# Patient Record
Sex: Female | Born: 1970
Health system: Southern US, Community
[De-identification: ages and names within clinical notes are randomized; demographics above are authoritative.]

## PROBLEM LIST (undated history)

## (undated) DIAGNOSIS — N946 Dysmenorrhea, unspecified: Secondary | ICD-10-CM

## (undated) DIAGNOSIS — K760 Fatty (change of) liver, not elsewhere classified: Secondary | ICD-10-CM

## (undated) DIAGNOSIS — M1990 Unspecified osteoarthritis, unspecified site: Secondary | ICD-10-CM

## (undated) DIAGNOSIS — S301XXA Contusion of abdominal wall, initial encounter: Secondary | ICD-10-CM

## (undated) DIAGNOSIS — E785 Hyperlipidemia, unspecified: Secondary | ICD-10-CM

## (undated) DIAGNOSIS — J45909 Unspecified asthma, uncomplicated: Secondary | ICD-10-CM

## (undated) DIAGNOSIS — G4730 Sleep apnea, unspecified: Secondary | ICD-10-CM

## (undated) DIAGNOSIS — I10 Essential (primary) hypertension: Secondary | ICD-10-CM

## (undated) DIAGNOSIS — G5603 Carpal tunnel syndrome, bilateral upper limbs: Secondary | ICD-10-CM

## (undated) DIAGNOSIS — R079 Chest pain, unspecified: Secondary | ICD-10-CM

## (undated) DIAGNOSIS — R0600 Dyspnea, unspecified: Secondary | ICD-10-CM

## (undated) DIAGNOSIS — M549 Dorsalgia, unspecified: Secondary | ICD-10-CM

## (undated) DIAGNOSIS — E669 Obesity, unspecified: Secondary | ICD-10-CM

## (undated) DIAGNOSIS — J302 Other seasonal allergic rhinitis: Secondary | ICD-10-CM

## (undated) DIAGNOSIS — K5900 Constipation, unspecified: Secondary | ICD-10-CM

## (undated) DIAGNOSIS — E538 Deficiency of other specified B group vitamins: Secondary | ICD-10-CM

## (undated) DIAGNOSIS — D649 Anemia, unspecified: Secondary | ICD-10-CM

## (undated) DIAGNOSIS — R7303 Prediabetes: Secondary | ICD-10-CM

## (undated) DIAGNOSIS — R002 Palpitations: Secondary | ICD-10-CM

## (undated) DIAGNOSIS — K529 Noninfective gastroenteritis and colitis, unspecified: Secondary | ICD-10-CM

## (undated) DIAGNOSIS — H18609 Keratoconus, unspecified, unspecified eye: Secondary | ICD-10-CM

## (undated) DIAGNOSIS — R600 Localized edema: Secondary | ICD-10-CM

## (undated) DIAGNOSIS — K219 Gastro-esophageal reflux disease without esophagitis: Secondary | ICD-10-CM

## (undated) DIAGNOSIS — B977 Papillomavirus as the cause of diseases classified elsewhere: Secondary | ICD-10-CM

## (undated) HISTORY — DX: Hyperlipidemia, unspecified: E78.5

## (undated) HISTORY — DX: Carpal tunnel syndrome, bilateral upper limbs: G56.03

## (undated) HISTORY — DX: Dysmenorrhea, unspecified: N94.6

## (undated) HISTORY — DX: Prediabetes: R73.03

## (undated) HISTORY — DX: Obesity, unspecified: E66.9

## (undated) HISTORY — DX: Papillomavirus as the cause of diseases classified elsewhere: B97.7

## (undated) HISTORY — DX: Contusion of abdominal wall, initial encounter: S30.1XXA

## (undated) HISTORY — DX: Localized edema: R60.0

## (undated) HISTORY — DX: Constipation, unspecified: K59.00

## (undated) HISTORY — DX: Dorsalgia, unspecified: M54.9

## (undated) HISTORY — PX: MYOMECTOMY ABDOMINAL APPROACH: SUR870

## (undated) HISTORY — DX: Chest pain, unspecified: R07.9

## (undated) HISTORY — DX: Essential (primary) hypertension: I10

## (undated) HISTORY — DX: Unspecified osteoarthritis, unspecified site: M19.90

## (undated) HISTORY — DX: Palpitations: R00.2

## (undated) HISTORY — DX: Unspecified asthma, uncomplicated: J45.909

## (undated) HISTORY — DX: Noninfective gastroenteritis and colitis, unspecified: K52.9

## (undated) HISTORY — DX: Dyspnea, unspecified: R06.00

## (undated) HISTORY — DX: Deficiency of other specified B group vitamins: E53.8

## (undated) HISTORY — DX: Keratoconus, unspecified, unspecified eye: H18.609

---
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
## 2003-12-12 ENCOUNTER — Other Ambulatory Visit: Admission: RE | Admit: 2003-12-12 | Discharge: 2003-12-12 | Payer: Self-pay | Admitting: Obstetrics and Gynecology

## 2004-02-10 ENCOUNTER — Emergency Department (HOSPITAL_COMMUNITY): Admission: EM | Admit: 2004-02-10 | Discharge: 2004-02-10 | Payer: Self-pay | Admitting: Emergency Medicine

## 2005-04-01 ENCOUNTER — Other Ambulatory Visit: Admission: RE | Admit: 2005-04-01 | Discharge: 2005-04-01 | Payer: Self-pay | Admitting: Gynecology

## 2005-06-09 ENCOUNTER — Encounter (INDEPENDENT_AMBULATORY_CARE_PROVIDER_SITE_OTHER): Payer: Self-pay | Admitting: Specialist

## 2005-06-09 ENCOUNTER — Inpatient Hospital Stay (HOSPITAL_COMMUNITY): Admission: RE | Admit: 2005-06-09 | Discharge: 2005-06-11 | Payer: Self-pay | Admitting: Gynecology

## 2006-06-11 ENCOUNTER — Other Ambulatory Visit: Admission: RE | Admit: 2006-06-11 | Discharge: 2006-06-11 | Payer: Self-pay | Admitting: Gynecology

## 2008-04-13 ENCOUNTER — Encounter: Payer: Self-pay | Admitting: Internal Medicine

## 2008-07-03 ENCOUNTER — Encounter: Admission: RE | Admit: 2008-07-03 | Discharge: 2008-07-03 | Payer: Self-pay | Admitting: Internal Medicine

## 2008-07-20 ENCOUNTER — Ambulatory Visit: Payer: Self-pay | Admitting: Gynecology

## 2008-07-20 ENCOUNTER — Encounter: Payer: Self-pay | Admitting: Gynecology

## 2008-07-20 ENCOUNTER — Other Ambulatory Visit: Admission: RE | Admit: 2008-07-20 | Discharge: 2008-07-20 | Payer: Self-pay | Admitting: Gynecology

## 2008-07-24 ENCOUNTER — Ambulatory Visit: Payer: Self-pay | Admitting: Gynecology

## 2008-07-24 ENCOUNTER — Encounter: Admission: RE | Admit: 2008-07-24 | Discharge: 2008-07-24 | Payer: Self-pay | Admitting: Gynecology

## 2008-07-28 ENCOUNTER — Encounter: Admission: RE | Admit: 2008-07-28 | Discharge: 2008-07-28 | Payer: Self-pay | Admitting: Gynecology

## 2008-09-21 ENCOUNTER — Ambulatory Visit: Payer: Self-pay | Admitting: Gynecology

## 2008-12-19 ENCOUNTER — Ambulatory Visit: Payer: Self-pay | Admitting: Gynecology

## 2009-02-28 ENCOUNTER — Encounter: Admission: RE | Admit: 2009-02-28 | Discharge: 2009-02-28 | Payer: Self-pay | Admitting: Gynecology

## 2009-06-17 ENCOUNTER — Inpatient Hospital Stay (HOSPITAL_COMMUNITY)
Admission: EM | Admit: 2009-06-17 | Discharge: 2009-06-19 | Payer: Self-pay | Source: Home / Self Care | Admitting: Emergency Medicine

## 2009-07-30 ENCOUNTER — Ambulatory Visit: Payer: Self-pay | Admitting: Gynecology

## 2009-07-30 ENCOUNTER — Other Ambulatory Visit: Admission: RE | Admit: 2009-07-30 | Discharge: 2009-07-30 | Payer: Self-pay | Admitting: Gynecology

## 2009-07-31 ENCOUNTER — Encounter: Admission: RE | Admit: 2009-07-31 | Discharge: 2009-07-31 | Payer: Self-pay | Admitting: Gynecology

## 2009-08-10 ENCOUNTER — Ambulatory Visit: Payer: Self-pay | Admitting: Gynecology

## 2009-10-05 ENCOUNTER — Ambulatory Visit: Payer: Self-pay | Admitting: Gynecology

## 2009-10-12 ENCOUNTER — Ambulatory Visit: Payer: Self-pay | Admitting: Gynecology

## 2009-12-26 ENCOUNTER — Ambulatory Visit: Payer: Self-pay | Admitting: Gynecology

## 2010-01-17 ENCOUNTER — Ambulatory Visit: Payer: Self-pay | Admitting: Gynecology

## 2010-02-06 ENCOUNTER — Ambulatory Visit: Payer: Self-pay | Admitting: Gynecology

## 2010-03-03 ENCOUNTER — Encounter: Payer: Self-pay | Admitting: Gynecology

## 2010-04-05 ENCOUNTER — Encounter: Payer: Self-pay | Admitting: Internal Medicine

## 2010-04-05 ENCOUNTER — Ambulatory Visit (INDEPENDENT_AMBULATORY_CARE_PROVIDER_SITE_OTHER): Payer: PRIVATE HEALTH INSURANCE | Admitting: Internal Medicine

## 2010-04-05 DIAGNOSIS — I10 Essential (primary) hypertension: Secondary | ICD-10-CM | POA: Insufficient documentation

## 2010-04-05 DIAGNOSIS — R079 Chest pain, unspecified: Secondary | ICD-10-CM | POA: Insufficient documentation

## 2010-04-15 ENCOUNTER — Telehealth: Payer: Self-pay | Admitting: Internal Medicine

## 2010-04-16 ENCOUNTER — Encounter (INDEPENDENT_AMBULATORY_CARE_PROVIDER_SITE_OTHER): Payer: Self-pay | Admitting: *Deleted

## 2010-04-16 ENCOUNTER — Other Ambulatory Visit: Payer: PRIVATE HEALTH INSURANCE

## 2010-04-16 ENCOUNTER — Other Ambulatory Visit: Payer: Self-pay | Admitting: Internal Medicine

## 2010-04-16 DIAGNOSIS — E785 Hyperlipidemia, unspecified: Secondary | ICD-10-CM

## 2010-04-16 DIAGNOSIS — R079 Chest pain, unspecified: Secondary | ICD-10-CM

## 2010-04-16 LAB — LIPID PANEL
Cholesterol: 201 mg/dL — ABNORMAL HIGH (ref 0–200)
HDL: 48 mg/dL (ref >39.00)
Total CHOL/HDL Ratio: 4
Triglycerides: 111 mg/dL (ref 0.0–149.0)
VLDL: 22.2 mg/dL (ref 0.0–40.0)

## 2010-04-16 LAB — LDL CHOLESTEROL, DIRECT: Direct LDL: 141.6 mg/dL

## 2010-04-16 LAB — TSH: TSH: 1.69 u[IU]/mL (ref 0.35–5.50)

## 2010-04-17 ENCOUNTER — Telehealth (INDEPENDENT_AMBULATORY_CARE_PROVIDER_SITE_OTHER): Payer: Self-pay | Admitting: *Deleted

## 2010-04-18 ENCOUNTER — Encounter: Payer: Self-pay | Admitting: Internal Medicine

## 2010-04-18 ENCOUNTER — Ambulatory Visit (HOSPITAL_BASED_OUTPATIENT_CLINIC_OR_DEPARTMENT_OTHER): Payer: PRIVATE HEALTH INSURANCE

## 2010-04-18 ENCOUNTER — Ambulatory Visit (HOSPITAL_COMMUNITY): Payer: PRIVATE HEALTH INSURANCE | Attending: Internal Medicine

## 2010-04-18 ENCOUNTER — Other Ambulatory Visit (HOSPITAL_COMMUNITY): Payer: PRIVATE HEALTH INSURANCE

## 2010-04-18 DIAGNOSIS — R0989 Other specified symptoms and signs involving the circulatory and respiratory systems: Secondary | ICD-10-CM | POA: Insufficient documentation

## 2010-04-18 DIAGNOSIS — R0602 Shortness of breath: Secondary | ICD-10-CM

## 2010-04-18 DIAGNOSIS — I10 Essential (primary) hypertension: Secondary | ICD-10-CM | POA: Insufficient documentation

## 2010-04-18 DIAGNOSIS — R0609 Other forms of dyspnea: Secondary | ICD-10-CM | POA: Insufficient documentation

## 2010-04-18 DIAGNOSIS — R072 Precordial pain: Secondary | ICD-10-CM

## 2010-04-18 DIAGNOSIS — E669 Obesity, unspecified: Secondary | ICD-10-CM | POA: Insufficient documentation

## 2010-04-18 DIAGNOSIS — E785 Hyperlipidemia, unspecified: Secondary | ICD-10-CM | POA: Insufficient documentation

## 2010-04-18 NOTE — Assessment & Plan Note (Signed)
Summary: chest pain/jr   Visit Type:  Initial Consult Primary Provider:  Gaynelle Cage   History of Present Illness: Patient is a 40 year old who was referred from [facility] clinic for evalation of chest pain.     The patinet notes over the past few days having an aching sensation under her L brest.  Lasts at most one hour.  Not pleuritic or associated with activity.  No reflux.  Not positional.  Comes/goes. She does note over the past few years being more SOB with activity.  She has to walk 4 flights of stairs and has to stop to catch her breath.  Feels her heart racing.   Has been seen in GI  in past.    Current Medications (verified): 1)  Benicar Hct 20-12.5 Mg Tabs (Olmesartan Medoxomil-Hctz) .... Once Daily 2)  Flonase 50 Mcg/act Susp (Fluticasone Propionate) .... Uad 3)  Multivitamins   Tabs (Multiple Vitamin) .... Once Daily  Allergies (verified): No Known Drug Allergies  Past History:  Past Medical History: Chest pain. Hypertension  Family History: Mother with hypertension, dyslipidemia Maternal GM died of heart problesm 43  Smoked Maternal GGM had MI at 54.  Smoker  Social History: Denies tobacco. No signif ETOH.  Review of Systems       All systems reviewed.  Neg to the above problem except as noted above.  Vital Signs:  Patient profile:   40 year old female Height:      61 inches Weight:      247 pounds BMI:     46.84 Pulse rate:   83 / minute BP sitting:   126 / 72  (left arm)  Vitals Entered By: Laurance Flatten CMA (April 05, 2010 4:32 PM)  Physical Exam  Additional Exam:  Patient is in NAD HEENT:  Normocephalic, atraumatic. EOMI, PERRLA.  Neck: JVP is normal. No thyromegaly. No bruits.  Lungs: clear to auscultation. No rales no wheezes.  Chest:  tender to palpation under L breast (different than pain she is experiencing) Heart: Regular rate and rhythm. Normal S1, S2. No S3.   No significant murmurs. PMI not displaced.  Abdomen:  Supple, nontender. Normal  bowel sounds. No masses. No hepatomegaly.  Extremities:   Good distal pulses throughout. No lower extremity edema.  Musculoskeletal :moving all extremities.  Neuro:   alert and oriented x3.    EKG  Procedure date:  04/05/2010  Findings:      At [facility] clinic NSR>  91 bpm.  LAD  Impression & Recommendations:  Problem # 1:  CHEST PAIN UNSPECIFIED (ICD-786.50) Assessment New Patient's chest pain is somewhat atypical.  But she has history of HTN, obesity.  Also FHx though later. I would recomm an echo and stress echo to evaluate. Activities as tolerated She has taken Aciphex in the past and I have asked her to try it again (had problems with other agents)  Problem # 2:  HYPERTENSION, BENIGN (ICD-401.1) Keep on same regimen Her updated medication list for this problem includes:    Benicar Hct 20-12.5 Mg Tabs (Olmesartan medoxomil-hctz) ..... Once daily  Problem # 3:  HEALTH SCREENING (ICD-V70.0) Needs to have lipids  Will check at [facility].  Other Orders: Echocardiogram (Echo) Stress Echo (Stress Echo)  Patient Instructions: 1)  Your physician has requested that you have a stress echocardiogram. For further information please visit [email or web address]  Please follow instruction sheet as given. 2)  Your physician has requested that you have an echocardiogram.  Echocardiography is a painless  test that uses sound waves to create images of your heart. It provides your doctor with information about the size and shape of your heart and how well your heart's chambers and valves are working.  This procedure takes approximately one hour. There are no restrictions for this procedure. Prescriptions: ACIPHEX 20 MG TBEC (RABEPRAZOLE SODIUM) 1 every day  #30 x 6   Entered by:   Layne Benton, RN, BSN   Authorized by:   Sherrill Raring, MD, [facility]   Signed by:   Layne Benton, RN, BSN on 04/05/2010   Method used:   Print then Give to Patient   RxID:   [id number]     Appended  Document: chest pain/jr EKG:  NSR  83 bpm.

## 2010-04-23 NOTE — Progress Notes (Signed)
Summary: pt rtn call  Phone Note Call from Patient   Caller: [phone] Reason for Call: Talk to Nurse Summary of Call: rtn call to [facility] Initial call taken by: Glynda Jaeger,  April 15, 2010 10:05 AM  Follow-up for Phone Call        Called patient back. She will have a lipid and a TSH drawn at the [address] office tomorrow.  Layne Benton, RN, BSN  April 15, 2010 7:33 PM

## 2010-04-23 NOTE — Progress Notes (Signed)
Summary: Stress echo appt.  Phone Note Outgoing Call Call back at [facility] Phone [phone]   Call placed by: Stanton Kidney, EMT-P,  April 17, 2010 2:30 PM Call placed to: Patient Action Taken: Phone Call Completed Summary of Call: Spoke with Patient ref: stress echo appt. Stanton Kidney, EMT-P  April 17, 2010 2:31 PM

## 2010-04-30 LAB — BASIC METABOLIC PANEL
BUN: 7 mg/dL (ref 6–23)
BUN: 8 mg/dL (ref 6–23)
CO2: 23 mEq/L (ref 19–32)
CO2: 28 mEq/L (ref 19–32)
Calcium: 8.2 mg/dL — ABNORMAL LOW (ref 8.4–10.5)
Calcium: 8.4 mg/dL (ref 8.4–10.5)
Chloride: 105 mEq/L (ref 96–112)
Chloride: 108 mEq/L (ref 96–112)
Creatinine, Ser: 0.68 mg/dL (ref 0.4–1.2)
Creatinine, Ser: 0.72 mg/dL (ref 0.4–1.2)
GFR calc Af Amer: >60 mL/min (ref >60)
GFR calc Af Amer: >60 mL/min (ref >60)
GFR calc non Af Amer: >60 mL/min (ref >60)
GFR calc non Af Amer: >60 mL/min (ref >60)
Glucose, Bld: 98 mg/dL (ref 70–99)
Glucose, Bld: 99 mg/dL (ref 70–99)
Potassium: 3.7 mEq/L (ref 3.5–5.1)
Potassium: 4.1 mEq/L (ref 3.5–5.1)
Sodium: 137 mEq/L (ref 135–145)
Sodium: 138 mEq/L (ref 135–145)

## 2010-04-30 LAB — CBC
HCT: 36.5 % (ref 36.0–46.0)
HCT: 37.5 % (ref 36.0–46.0)
HCT: 41 % (ref 36.0–46.0)
Hemoglobin: 11.7 g/dL — ABNORMAL LOW (ref 12.0–15.0)
Hemoglobin: 12.2 g/dL (ref 12.0–15.0)
Hemoglobin: 13.4 g/dL (ref 12.0–15.0)
MCHC: 32 g/dL (ref 30.0–36.0)
MCHC: 32.6 g/dL (ref 30.0–36.0)
MCHC: 32.7 g/dL (ref 30.0–36.0)
MCV: 83.9 fL (ref 78.0–100.0)
MCV: 84.2 fL (ref 78.0–100.0)
MCV: 84.6 fL (ref 78.0–100.0)
Platelets: 319 10*3/uL (ref 150–400)
Platelets: 335 10*3/uL (ref 150–400)
Platelets: 412 10*3/uL — ABNORMAL HIGH (ref 150–400)
RBC: 4.31 MIL/uL (ref 3.87–5.11)
RBC: 4.45 MIL/uL (ref 3.87–5.11)
RBC: 4.89 MIL/uL (ref 3.87–5.11)
RDW: 14 % (ref 11.5–15.5)
RDW: 14.3 % (ref 11.5–15.5)
RDW: 14.4 % (ref 11.5–15.5)
WBC: 12.8 10*3/uL — ABNORMAL HIGH (ref 4.0–10.5)
WBC: 13.5 10*3/uL — ABNORMAL HIGH (ref 4.0–10.5)
WBC: 20.2 10*3/uL — ABNORMAL HIGH (ref 4.0–10.5)

## 2010-04-30 LAB — CLOSTRIDIUM DIFFICILE EIA
C difficile Toxins A+B, EIA: NEGATIVE
C difficile Toxins A+B, EIA: NEGATIVE

## 2010-04-30 LAB — DIFFERENTIAL
Basophils Absolute: 0 10*3/uL (ref 0.0–0.1)
Basophils Relative: 0 % (ref 0–1)
Eosinophils Absolute: 0 10*3/uL (ref 0.0–0.7)
Eosinophils Relative: 0 % (ref 0–5)
Lymphocytes Relative: 13 % (ref 12–46)
Lymphs Abs: 2.5 10*3/uL (ref 0.7–4.0)
Monocytes Absolute: 0.9 10*3/uL (ref 0.1–1.0)
Monocytes Relative: 5 % (ref 3–12)
Neutro Abs: 16.7 10*3/uL — ABNORMAL HIGH (ref 1.7–7.7)
Neutrophils Relative %: 83 % — ABNORMAL HIGH (ref 43–77)

## 2010-04-30 LAB — URINALYSIS, ROUTINE W REFLEX MICROSCOPIC
Bilirubin Urine: NEGATIVE
Glucose, UA: NEGATIVE mg/dL
Hgb urine dipstick: NEGATIVE
Ketones, ur: NEGATIVE mg/dL
Nitrite: NEGATIVE
Protein, ur: NEGATIVE mg/dL
Specific Gravity, Urine: 1.002 — ABNORMAL LOW (ref 1.005–1.030)
Urobilinogen, UA: 0.2 mg/dL (ref 0.0–1.0)
pH: 7 (ref 5.0–8.0)

## 2010-04-30 LAB — COMPREHENSIVE METABOLIC PANEL
ALT: 19 U/L (ref 0–35)
AST: 22 U/L (ref 0–37)
Albumin: 3.9 g/dL (ref 3.5–5.2)
Alkaline Phosphatase: 90 U/L (ref 39–117)
BUN: 5 mg/dL — ABNORMAL LOW (ref 6–23)
CO2: 26 mEq/L (ref 19–32)
Calcium: 9.1 mg/dL (ref 8.4–10.5)
Chloride: 100 mEq/L (ref 96–112)
Creatinine, Ser: 0.58 mg/dL (ref 0.4–1.2)
GFR calc Af Amer: >60 mL/min (ref >60)
GFR calc non Af Amer: >60 mL/min (ref >60)
Glucose, Bld: 103 mg/dL — ABNORMAL HIGH (ref 70–99)
Potassium: 3.2 mEq/L — ABNORMAL LOW (ref 3.5–5.1)
Sodium: 135 mEq/L (ref 135–145)
Total Bilirubin: 0.9 mg/dL (ref 0.3–1.2)
Total Protein: 7.5 g/dL (ref 6.0–8.3)

## 2010-04-30 LAB — LIPASE, BLOOD: Lipase: 16 U/L (ref 11–59)

## 2010-06-28 NOTE — Op Note (Signed)
Mindy Collins, Mindy Collins                 ACCOUNT NO.:  [id number]   MEDICAL RECORD NO.:  [id number]          PATIENT TYPE:  INP   LOCATION:  9399                          FACILITY:  [facility]   PHYSICIAN:  Juan H. Lily Peer, M.D.DATE OF BIRTH:  12/15/1970   DATE OF PROCEDURE:  06/09/2005  DATE OF DISCHARGE:                                 OPERATIVE REPORT   INDICATIONS FOR OPERATION:  A 40 year old gravida 0 with symptomatic  leiomyomata uteri consisting of cramping and bloating and heavy periods,  back discomfort and constipation.   PREOPERATIVE DIAGNOSIS:  Symptomatic leiomyomata uteri.   POSTOPERATIVE DIAGNOSIS:  Symptomatic leiomyomata uteri.   ANESTHESIA:  General endotracheal anesthesia.   SURGEON:  Juan H. Lily Peer, M.D.   FIRST ASSISTANT:  Daniel L. Eda Paschal, M.D.   FINDINGS:  A 14 weeks' size and uterus with a large single intramural fundal  myoma measuring approximately 10 cm in diameter.  Normal-appearing tubes and  ovary.   DESCRIPTION OF OPERATION:  After the patient was adequately counseled, she  was taken to the operating room, where she underwent general endotracheal  anesthesia.  The abdomen, vagina and perineum were prepped and draped in the  usual sterile fashion.  A Foley catheter was inserted in an effort to  monitor urinary output and a pediatric catheter was inserted into the  cervical canal into the uterus in an effort to perform an intraoperative  chromopertubation.  After the drapes were in place, a Pfannenstiel skin  incision was made 2 cm above the symphysis pubis.  The incision was carried  down from the skin, subcutaneous tissue, down to the rectus fascia, where a  midline nick was made, the fascia was incised in a transverse fashion.  The  peritoneal cavity was entered cautiously.  The fibroid was identified and  two Lahey clamps were placed in the fundal region of the uterus.  Pitressin  1:1 dilution was infiltrated into the serosa of the fundal region  where the  incision was made on the uterus for a total of 10 mL.  A vertical incision  was made in the fundal region of the uterus and the large 10 cm fibroid was  enucleated.  It weighed 409.3 grams.  The endometrial cavity was not  entered, and the cavity was closed in layered fashion with interrupted  sutures of 0 Vicryl suture, and finally the serosa was closed with an  imbricating stitch of 0 Vicryl suture.  Both tubes and ostia were  identified, chromopertubation was attempted with no known dye spillage,  perhaps from tubal spasm, but the tubes appeared to be lush fimbriated ends  with no abnormality noted, and both ovaries were normal.  The cul-de-sac was  free of adhesions or endometriotic implants.  The pelvic cavity was  copiously irrigated with normal saline solution.  Sponge count and needle  count were correct.  Interceed was placed over the surface of the vertical  fundal incision in an effort to prevent future adhesions.  The visceral  peritoneum was not closed.  The rectus fascia was closed with a running  stitch of  0 Vicryl suture.  The subcutaneous bleeders were Bovie cauterized.  The skin was reapproximated with skin clips, followed by Xeroform gauze and  4 x 8 dressing.  The patient received 10 mL of 0.25% Marcaine in the  incision site for postoperative analgesia.  The patient was  extubated, transferred to recovery room with stable vital signs.  Blood loss  for the procedure was 300 mL.  IV fluid:  2 L of lactated Ringer's.  Urine  output 600 mL.  She had PSA stockings for DVT prophylaxis and received 3 g  was cefoxitin for prophylaxis as well.      Juan H. Lily Peer, M.D.  Electronically Signed     JHF/MEDQ  D:  06/09/2005  T:  06/09/2005  Job:  [id number]

## 2010-06-28 NOTE — H&P (Signed)
NAMEHELMI, HECHAVARRIA                 ACCOUNT NO.:  [id number]   MEDICAL RECORD NO.:  [id number]          PATIENT TYPE:  AMB   LOCATION:  SDC                           [facility]ACILITY:  [facility]   PHYSICIAN:  Juan H. Lily Peer, M.D.DATE OF BIRTH:  1970/05/20   DATE OF ADMISSION:  06/09/2005  DATE OF DISCHARGE:                                HISTORY & PHYSICAL   CHIEF COMPLAINT:  Symptomatic leiomyomatous uteri.   HISTORY:  The patient is a 40 year old gravida 0 who had been seen in the  office on several occasions complaining of bloating, heavy periods, back  discomfort, constipation.  Her workup had consisted of an ultrasound which  demonstrated a large uterus, unable to distinguish a mass or fibroid per se.  The dimensions on the ultrasound of the uterus were 11.9 x 9.2 x 10.5 cm,  right ovary was normal, left ovary was difficult to identify but the adnexa  was negative, no fluid in the cul-de-sac.  The patient had a normal  endometrial biopsy with benign proliferative endometrium, no hyperplasia or  malignancy was reported.  The patient subsequently was sent for an MRI to  help delineate the mass effect on the uterus and the description was as  follows:  The uterus measured 11.4 x 10.4 x 9.8 cm and in the fundal region  of the uterus there was a mass which measured 9.2 x 8.1 x 9.2 cm with  heterogeneous pattern of signal intensity predominantly due to the presence  of a large fundal fibroid with the dimensions given.  No other abnormality  is noted to suspect this to be any malignancy.  The patient is scheduled to  undergo exploratory laparotomy with abdominal myomectomy and she was to  donate 1-2 units of packed red blood cells in the event of autologous blood.   PAST MEDICAL HISTORY:  The patient had cryotherapy many years ago for  dysplasia in 1993, she had history of condylomata ___________ .  She denies  any allergies.  She has seasonal asthma and uses an inhaler on a p.r.n.  basis and had  been using barrier contraception.   PHYSICAL EXAMINATION:  The patient weighs 250 pounds.  Blood pressure  120/80.  HEENT was unremarkable.  Her neck supple, trachea midline, no  carotid bruits, no thyromegaly, lungs were clear to auscultation without  rhonchus or wheezes, heart regular rate and rhythm, no murmurs or gallops.  Breast exam done on February 20 was normal.  Abdomen soft, nontender,  without rebound or guarding.  Pelvic, Bartholin, urethra and Skene's within  normal limits.  Uterus slightly irregular shape, especially towards her left  adnexa, difficult to assess her adnexa.  Rectal exam noncontributory.   ASSESSMENT:  A 40 year old gravida 0 with symptomatic leiomyomatous uteri  consisting of bloating, pressure, heavy periods, constipation.  The patient  had a urine pregnancy test on March 1 prior to her endometrial biopsy which  was negative.  Her endometrial biopsy was benign.  The patient's ultrasound  as reported above.  Pap smear was also normal.  The patient is scheduled to  undergo exploratory  laparotomy with abdominal myomectomy.  The risks and  benefits and pros and cons of the procedure were discussed with the patient  to include infection, also receiving prophylaxis antibiotic, the risk for  deep venous thrombosis and subsequent pulmonary embolism were discussed, she  will have PAS stockings, also in the event of uncontrollable hemorrhage she  may need blood products or blood transfusion, she attempted to donate 1-2  units of her own blood in the event of autologous transfusions.  Nevertheless, if additional blood is needed and she were to receive donor  blood she is fully aware of the potential risks for anaphylactic reactions,  hepatitis and AIDS, and also in the event of uncontrollable hemorrhage she  is fully aware that she could loose all her reproductive organs to even  include her ovaries and need to be on hormone replacement therapy for many  years to  come, also the risk for trauma to internal organs were discussed  with the need for corrective surgery.  All these issues were discussed with  the patient and we will follow accordingly.   PLAN:  The patient is scheduled for exploratory laparotomy with abdominal  myomectomy for Monday, June 09, 2005 at 7:30 a.m. at [facility].      Juan H. Lily Peer, M.D.  Electronically Signed     JHF/MEDQ  D:  06/08/2005  T:  06/08/2005  Job:  [id number]

## 2010-06-28 NOTE — Discharge Summary (Signed)
Mindy Collins, Mindy Collins                 ACCOUNT NO.:  [id number]   MEDICAL RECORD NO.:  [id number]          PATIENT TYPE:  INP   LOCATION:  9319                          [facility]  [facility]   PHYSICIAN:  Juan H. Lily Peer, M.D.DATE OF BIRTH:  11/05/1970   DATE OF ADMISSION:  06/09/2005  DATE OF DISCHARGE:  06/11/2005                                 DISCHARGE SUMMARY   HISTORY:  The patient is a 40 year old gravida 0 with symptomatic  leiomyomatous uteri.  Patient underwent abdominal myomectomy on the morning  of April 30.  She had a 300 mL blood loss, did well.  She received  prophylaxis antibiotics as well as PSA stockings for DVT prophylaxis.  Her  postoperative day she had good urinary output.  Her Foley catheter was  removed.  Temperature max was 98.4.  Her Foley was discontinued.  She  started on clear liquid diet was advancing to a regular diet later that day.  She was instructed to ambulate and shower and her PCA pump was discontinued  and she was placed on Percocet for pain relief.  Her hemoglobin/hematocrit  were 9.3 and 27.8, respectively with a platelet count of 442,000.  Patient  was ready to be discharged home on the second postoperative day and she was  instructed to return to the office in 48 hours to have her staples removed.  Her pathology report demonstrated a benign leiomyoma and excision of an  omental cyst with a benign cyst.  A myoma and an omental cyst that was  excised from her umbilical region which were benign cysts.   FINAL DIAGNOSIS:  Symptomatic leiomyomatous uteri.   PROCEDURE PERFORMED:  Abdominal myomectomy.   FINAL DISPOSITION AND FOLLOW UP:  Patient was instructed to follow up in the  office in 48 hours to have her staples removed.  She was given a  prescription of Lortab 7.5/500 to take one p.o. q.4-6h. p.r.n. pain. For her  anemia she was instructed to take supplemental iron tablets one p.o. daily.  Discharge instructions were provided and will follow  accordingly.      Juan H. Lily Peer, M.D.  Electronically Signed     JHF/MEDQ  D:  07/06/2005  T:  07/07/2005  Job:  [id number]

## 2010-08-02 ENCOUNTER — Encounter: Payer: PRIVATE HEALTH INSURANCE | Admitting: Gynecology

## 2010-08-07 ENCOUNTER — Encounter: Payer: PRIVATE HEALTH INSURANCE | Admitting: Gynecology

## 2010-08-08 ENCOUNTER — Other Ambulatory Visit: Payer: Self-pay | Admitting: Gynecology

## 2010-08-08 ENCOUNTER — Other Ambulatory Visit (HOSPITAL_COMMUNITY)
Admission: RE | Admit: 2010-08-08 | Discharge: 2010-08-08 | Disposition: A | Discharge status: Home / Self Care | Payer: PRIVATE HEALTH INSURANCE | Source: Ambulatory Visit | Attending: Gynecology | Admitting: Gynecology

## 2010-08-08 ENCOUNTER — Encounter (INDEPENDENT_AMBULATORY_CARE_PROVIDER_SITE_OTHER): Payer: PRIVATE HEALTH INSURANCE | Admitting: Gynecology

## 2010-08-08 DIAGNOSIS — B373 Candidiasis of vulva and vagina: Secondary | ICD-10-CM

## 2010-08-08 DIAGNOSIS — Z124 Encounter for screening for malignant neoplasm of cervix: Secondary | ICD-10-CM | POA: Insufficient documentation

## 2010-08-08 DIAGNOSIS — Z01419 Encounter for gynecological examination (general) (routine) without abnormal findings: Secondary | ICD-10-CM

## 2010-08-13 ENCOUNTER — Encounter: Payer: Self-pay | Admitting: Internal Medicine

## 2010-08-13 ENCOUNTER — Ambulatory Visit (INDEPENDENT_AMBULATORY_CARE_PROVIDER_SITE_OTHER): Payer: PRIVATE HEALTH INSURANCE | Admitting: Gynecology

## 2010-08-13 ENCOUNTER — Other Ambulatory Visit: Payer: PRIVATE HEALTH INSURANCE

## 2010-08-13 DIAGNOSIS — D3910 Neoplasm of uncertain behavior of unspecified ovary: Secondary | ICD-10-CM

## 2010-08-13 DIAGNOSIS — R1900 Intra-abdominal and pelvic swelling, mass and lump, unspecified site: Secondary | ICD-10-CM

## 2010-08-13 DIAGNOSIS — D259 Leiomyoma of uterus, unspecified: Secondary | ICD-10-CM

## 2010-08-13 DIAGNOSIS — N83209 Unspecified ovarian cyst, unspecified side: Secondary | ICD-10-CM

## 2010-08-16 ENCOUNTER — Other Ambulatory Visit (INDEPENDENT_AMBULATORY_CARE_PROVIDER_SITE_OTHER): Payer: PRIVATE HEALTH INSURANCE

## 2010-08-16 DIAGNOSIS — Z113 Encounter for screening for infections with a predominantly sexual mode of transmission: Secondary | ICD-10-CM

## 2010-10-29 ENCOUNTER — Ambulatory Visit: Payer: PRIVATE HEALTH INSURANCE

## 2010-10-29 ENCOUNTER — Other Ambulatory Visit: Payer: Self-pay | Admitting: Gynecology

## 2010-10-29 ENCOUNTER — Ambulatory Visit: Payer: PRIVATE HEALTH INSURANCE | Admitting: Gynecology

## 2010-10-29 ENCOUNTER — Ambulatory Visit (INDEPENDENT_AMBULATORY_CARE_PROVIDER_SITE_OTHER): Payer: PRIVATE HEALTH INSURANCE | Admitting: Gynecology

## 2010-10-29 DIAGNOSIS — D3910 Neoplasm of uncertain behavior of unspecified ovary: Secondary | ICD-10-CM

## 2010-10-29 DIAGNOSIS — D259 Leiomyoma of uterus, unspecified: Secondary | ICD-10-CM

## 2010-10-29 DIAGNOSIS — D251 Intramural leiomyoma of uterus: Secondary | ICD-10-CM

## 2010-10-29 DIAGNOSIS — R1900 Intra-abdominal and pelvic swelling, mass and lump, unspecified site: Secondary | ICD-10-CM | POA: Insufficient documentation

## 2010-10-29 DIAGNOSIS — N852 Hypertrophy of uterus: Secondary | ICD-10-CM

## 2010-10-29 NOTE — Progress Notes (Signed)
Patient is a 40 year old gravida 0 with known history of small fibroids in her uterus is 2010. Patient had been in the office on June 28 for her annual exam but due to her BMI being 48 and weighing 251 pounds for complete assessment of the adnexa and ultrasound have been ordered. The ultrasound demonstrated a uterus and measured 12.5 x 5.2 x 4.6 cm with an endometrial stripe of 15 mm the ultrasound had demonstrated intramural myomas the following sizes 42 mm, 34 mm and a prominent endometrial cavity right ovary was normal left ovary echo free follicle measuring 18 mm with a solid mass adjacent to the follicle measuring 20 x 22 x 21 mm negative color flow and posterior wall shadowing from the mass was noted she was supposed to return back from 3 months for an ultrasound and came back a month earlier. The ultrasound today demonstrated a uterus measuring 9.7 x 7.1 x 5.5 cm with an endometrial stripe of 5.4 mm an enlarged intramural myoma measuring 55 x 38 x 44 mm was noted and one measuring 25 x 23 mm with calcifications on the wall noted the right ovary was normal the left ovary had several follicles the left ovary had a cyst and solid mass measuring 34 x 31 x 26 mm negative color flow suspicious for dermoid cysts.  Review of patient's record had indicated that in April 2007 she underwent exploratory laparotomy with removal of a single large intramural myoma that measured 10 cm in size. Patient is not sexually active is having normal menstrual cycles otherwise. We had discussed several scenarios as follows: #1 followup with ultrasound in 3 months if cyst is still persistent to proceed with a laparoscopic ovarian cystectomy. #2 followup with ultrasound in 3 months if cyst still present and myomas still the same size or larger to consider his been an abdominal myomectomy with ovarian cystectomy. #3 followup ultrasound in 3 months as cystoscopy present or larger and fibroid same size or larger and patient  symptomatic and not interested in having children in the future an abdominal hysterectomy with ovarian cystectomy would be the route to go.  A CA 125 will be drawn today patient is fully aware of its limitations. Literature information was provided on the above subject matter. All questions are answered we'll follow accordingly.

## 2010-10-30 LAB — CA 125: CA 125: 14.7 U/mL (ref 0.0–30.2)

## 2010-11-11 ENCOUNTER — Telehealth: Payer: Self-pay | Admitting: *Deleted

## 2010-11-11 NOTE — Telephone Encounter (Signed)
Pt informed with ca 125 results.

## 2010-11-11 NOTE — Telephone Encounter (Signed)
Message copied by Aura Camps on Mon Nov 11, 2010  3:43 PM ------      Message from: Carole Civil R      Created: Mon Nov 11, 2010  3:29 PM      Regarding: CA-125 RESULTS      Contact: [phone]       PT STOPPED BY AT [facility] DESK JUST NOW LOOKING FOR HER CA-125 RESULTS. COULD YOU SEE IF THE LAB IS BACK AND CALL HER WITH THE RESULTS? I BELIEVE THE DOS WAS 10/29/10. THANKS,WENDY

## 2010-12-10 ENCOUNTER — Other Ambulatory Visit: Payer: Self-pay | Admitting: Family Medicine

## 2010-12-13 ENCOUNTER — Other Ambulatory Visit: Payer: Self-pay | Admitting: Family Medicine

## 2010-12-13 ENCOUNTER — Ambulatory Visit
Admission: RE | Admit: 2010-12-13 | Discharge: 2010-12-13 | Disposition: A | Discharge status: Home / Self Care | Payer: PRIVATE HEALTH INSURANCE | Source: Ambulatory Visit | Attending: Family Medicine | Admitting: Family Medicine

## 2010-12-13 DIAGNOSIS — J3489 Other specified disorders of nose and nasal sinuses: Secondary | ICD-10-CM

## 2010-12-13 DIAGNOSIS — R51 Headache: Secondary | ICD-10-CM

## 2010-12-24 ENCOUNTER — Telehealth: Payer: Self-pay

## 2010-12-24 NOTE — Telephone Encounter (Signed)
Patient called me today and said she was to have surgery in Dec. I had not received an order and when I looked in her chart what it appeared was she was to have an u/s follow up in Dec.  She said she and Dr. Glenetta Hew talked about having the u/s the end of Nov and doing surgery the first week in Dec as she is out of school.  She said she was planning on that.  I told her that it usually takes more notice than a week for inpatient surgery assuming she was to need the myomectomy.  Patient has decided she would not want hysterectomy but would like myomectomy.  I told her we could schedule that time for abd myomect, ov cystectomy and hold it and cancel part of it or all of it if not needed after u/s.  She asked about autologus blood donation.  I talked with Dr. Glenetta Hew and he reviewed her chart.  He said if the ovarian cyst is gone she may not even need surgery. He said the fibroid was an incidental finding and not causing any problems.  He may not want to do myomectomy if that fibroid has not changed.  Patient advised there may be no need for surgery if cyst gone. Patient was advised that Dr. Glenetta Hew rec that he really needs to see u/s before any real plans for surgery can be made. We will hold the Dec 12 date and she will need to forgo autologus blood donation if surgery is needed on that date.  She understands and scheduled her u/s for end of November and will go from there.  I did discuss her insurance and her costs of different surgery scenarios that could present.  ka

## 2010-12-30 ENCOUNTER — Other Ambulatory Visit: Payer: Self-pay | Admitting: Gynecology

## 2010-12-30 DIAGNOSIS — N83209 Unspecified ovarian cyst, unspecified side: Secondary | ICD-10-CM

## 2011-01-01 ENCOUNTER — Telehealth: Payer: Self-pay

## 2011-01-01 NOTE — Telephone Encounter (Addendum)
Pt. Is scheduled tentatively for Myomectomy on 01/22/11 at [facility].  To have u/s on Monday to see if Myomectomy even needed.  She saw ENT yesterday and was diagnosed with TMJ.  Part of her treatment plan is to take a high does of Ibuprofen tid for two weeks.  She wondered if  She should wait until after surgery to start this part of her treatment plan.   Patient advised to not start Ibuprofen until she has u/s Monday and talks with Dr. Glenetta Hew then.  I feel sure if surgery needed he will want her to wait until after surgery to start that part of her TMJ treatment plan.

## 2011-01-04 ENCOUNTER — Emergency Department (HOSPITAL_COMMUNITY)
Admission: EM | Admit: 2011-01-04 | Discharge: 2011-01-04 | Disposition: A | Discharge status: Home / Self Care | Payer: PRIVATE HEALTH INSURANCE | Attending: Emergency Medicine | Admitting: Emergency Medicine

## 2011-01-04 ENCOUNTER — Other Ambulatory Visit: Payer: Self-pay

## 2011-01-04 ENCOUNTER — Emergency Department (HOSPITAL_COMMUNITY): Payer: PRIVATE HEALTH INSURANCE

## 2011-01-04 ENCOUNTER — Encounter (HOSPITAL_COMMUNITY): Payer: Self-pay | Admitting: *Deleted

## 2011-01-04 DIAGNOSIS — M25519 Pain in unspecified shoulder: Secondary | ICD-10-CM | POA: Insufficient documentation

## 2011-01-04 DIAGNOSIS — R079 Chest pain, unspecified: Secondary | ICD-10-CM | POA: Insufficient documentation

## 2011-01-04 HISTORY — DX: Gastro-esophageal reflux disease without esophagitis: K21.9

## 2011-01-04 LAB — BASIC METABOLIC PANEL
BUN: 8 mg/dL (ref 6–23)
CO2: 29 mEq/L (ref 19–32)
Calcium: 9.5 mg/dL (ref 8.4–10.5)
Chloride: 99 mEq/L (ref 96–112)
Creatinine, Ser: 0.67 mg/dL (ref 0.50–1.10)
GFR calc Af Amer: >90 mL/min (ref >90)
GFR calc non Af Amer: >90 mL/min (ref >90)
Glucose, Bld: 93 mg/dL (ref 70–99)
Potassium: 3.2 mEq/L — ABNORMAL LOW (ref 3.5–5.1)
Sodium: 136 mEq/L (ref 135–145)

## 2011-01-04 LAB — CBC
HCT: 39.3 % (ref 36.0–46.0)
Hemoglobin: 12.9 g/dL (ref 12.0–15.0)
MCH: 26.4 pg (ref 26.0–34.0)
MCHC: 32.8 g/dL (ref 30.0–36.0)
MCV: 80.5 fL (ref 78.0–100.0)
Platelets: 388 10*3/uL (ref 150–400)
RBC: 4.88 MIL/uL (ref 3.87–5.11)
RDW: 14.1 % (ref 11.5–15.5)
WBC: 12.2 10*3/uL — ABNORMAL HIGH (ref 4.0–10.5)

## 2011-01-04 LAB — TROPONIN I: Troponin I: <0.3 ng/mL (ref <0.30)

## 2011-01-04 MED ORDER — ASPIRIN 81 MG PO CHEW
324.0000 mg | CHEWABLE_TABLET | Freq: Once | ORAL | Status: AC
  Administered 2011-01-04: 324 mg via ORAL
  Filled 2011-01-04: qty 4

## 2011-01-04 MED ORDER — POTASSIUM CHLORIDE CRYS ER 20 MEQ PO TBCR
40.0000 meq | EXTENDED_RELEASE_TABLET | Freq: Once | ORAL | Status: AC
  Administered 2011-01-04: 40 meq via ORAL
  Filled 2011-01-04: qty 2

## 2011-01-04 NOTE — ED Provider Notes (Signed)
Pt has a stress test in May which she states was normal. On Thursday while she was cooking she developed a significant pain in her left shoulder blade that radiated around her ribs to her front chest. She described it as a heavy pressure. She states that she laid down and the pain got quite a bit better. Since Thursday the pain has not completely gotten away, sometimes it doubles her over.   NAD, A&O x 3, when I apply heavy pressure to the chest and back it does not increase the pain. She is still feeling pain in her shoulder blade but has FROM of shoulder, with no increased pain to palpation or movement.  Wll start work-up and move to back for cardiac work-up.   Date: 01/04/2011  Rate: 97  Rhythm: normal sinus rhythm  QRS Axis: normal  Intervals: normal  ST/T Wave abnormalities: normal  Conduction Disutrbances:none  Narrative Interpretation:  Left axis deviation, borderline T abnormalities, borderline prolonged QT interval  Old EKG Reviewed: none available    Dorthula Matas, PA 01/04/11 1706  Dorthula Matas, PA 01/04/11 1723  Dorthula Matas, PA 01/04/11 1907

## 2011-01-04 NOTE — ED Notes (Signed)
B/P 109/70  HR  78  SR, R 18  Pulse ox 100% on room air--Continues to deny chest wall but, does reports mid sternal chest tenderness to touch

## 2011-01-04 NOTE — ED Provider Notes (Signed)
Medical screening examination/treatment/procedure(s) were conducted as a shared visit with non-physician practitioner(s) and myself.  I personally evaluated the patient during the encounter   Joya Gaskins, MD 01/04/11 2322

## 2011-01-04 NOTE — ED Notes (Signed)
Pt states Thursday she was lifting a piece of foil and had pain in her post shoulder, then ribs and arm, pain is intermittent, and is bothering her again today

## 2011-01-04 NOTE — ED Provider Notes (Signed)
PT SEEN WITH PA REPORTS PAIN MOSTLY WORSE WITH MOVEMENT IN BED DENIES SOB/DIAPHORESIS/VOMITING SHE IS WELL APPEARING STABLE FOR D/C  Joya Gaskins, MD 01/04/11 1851

## 2011-01-04 NOTE — ED Notes (Signed)
Placed pt on bedside monitoring

## 2011-01-06 ENCOUNTER — Other Ambulatory Visit: Payer: Self-pay | Admitting: Gynecology

## 2011-01-06 ENCOUNTER — Ambulatory Visit (INDEPENDENT_AMBULATORY_CARE_PROVIDER_SITE_OTHER): Payer: PRIVATE HEALTH INSURANCE | Admitting: Gynecology

## 2011-01-06 ENCOUNTER — Encounter: Payer: Self-pay | Admitting: Gynecology

## 2011-01-06 ENCOUNTER — Ambulatory Visit (INDEPENDENT_AMBULATORY_CARE_PROVIDER_SITE_OTHER): Payer: PRIVATE HEALTH INSURANCE

## 2011-01-06 ENCOUNTER — Other Ambulatory Visit: Payer: PRIVATE HEALTH INSURANCE

## 2011-01-06 ENCOUNTER — Institutional Professional Consult (permissible substitution): Payer: PRIVATE HEALTH INSURANCE | Admitting: Gynecology

## 2011-01-06 VITALS — BP 130/80

## 2011-01-06 DIAGNOSIS — N831 Corpus luteum cyst of ovary, unspecified side: Secondary | ICD-10-CM

## 2011-01-06 DIAGNOSIS — N83209 Unspecified ovarian cyst, unspecified side: Secondary | ICD-10-CM

## 2011-01-06 DIAGNOSIS — N854 Malposition of uterus: Secondary | ICD-10-CM

## 2011-01-06 DIAGNOSIS — N852 Hypertrophy of uterus: Secondary | ICD-10-CM

## 2011-01-06 DIAGNOSIS — D3910 Neoplasm of uncertain behavior of unspecified ovary: Secondary | ICD-10-CM

## 2011-01-06 DIAGNOSIS — D259 Leiomyoma of uterus, unspecified: Secondary | ICD-10-CM

## 2011-01-06 DIAGNOSIS — N83202 Unspecified ovarian cyst, left side: Secondary | ICD-10-CM

## 2011-01-06 DIAGNOSIS — D251 Intramural leiomyoma of uterus: Secondary | ICD-10-CM

## 2011-01-06 NOTE — Progress Notes (Signed)
ELLIEANNA FUNDERBURG 1970-07-09 [id number]   History:    40 y.o.  gravida 0 for preoperative consultation as a result of patient's persistent left ovarian cyst and right intramural myoma. Review of patient's records indicated that back in 2007 as a result of her symptomatic leiomyomatous uteri she had undergone an abdominal myomectomy for a fundal fibroid that measured 10 cm. She had otherwise normal. Tubes and ovaries at that time. Patient been in the office in June of this year for her annual exam but due to her obesity and ultrasound was done in 2 intramural myomas had been noted along with a left ovarian thinwall echo-free cyst measuring 20 x 22 x 22 mm she was instructed to return back in 3 months which she didn't September 18 the ultrasound demonstrated the intramural myoma had increased in size to 55 x 38 x 44 mm and adjacent myoma measuring 25 x 23 mm with calcification. Right ovary was normal a left solid mass well-defined negative color flow measuring 34 x 31 x 26 mm was noted. She didn't return on November 26 for an ultrasound and for discussion of upcoming surgery. Ultrasound demonstrated a uterus with a measurement of 9.9 x 8.2 x 6.9 cm with an intramural myoma measuring 65 x 64 x 56 mm, and an adjacent one measuring 16 x 23 mm. A left solid mass in inferior medial wall of the left ovary measuring 36 x 27 x 38 mm negative color flow. Patient had a normal CA 125 in September of this year.   Past medical history,surgical history, family history and social history were all reviewed and documented in the EPIC chart.  Gynecologic History Patient's last menstrual period was 12/20/2010. Contraception: abstinence Last Pap: 2012. Results were: normal Last mammogram: 2011. Results were: normal  Obstetric History OB History    Grav Para Term Preterm Abortions TAB SAB Ect Mult Living                   ROS:  Was performed and pertinent positives and negatives are included in the  history.  Exam: chaperone present  BP 130/80  LMP 12/20/2010  There is no height or weight on file to calculate BMI.  General appearance : Well developed well nourished female. No acute distress HEENT: Neck supple, trachea midline, no carotid bruits, no thyroidmegaly Lungs: Clear to auscultation, no rhonchi or wheezes, or rib retractions  Heart: Regular rate and rhythm, no murmurs or gallops Breast:Examined in sitting and supine position were symmetrical in appearance, no palpable masses or tenderness,  no skin retraction, no nipple inversion, no nipple discharge, no skin discoloration, no axillary or supraclavicular lymphadenopathy Abdomen: no palpable masses or tenderness, no rebound or guarding Extremities: no edema or skin discoloration or tenderness  Pelvic:  Bartholin, Urethra, Skene Glands: Within normal limits             Vagina: No gross lesions or discharge  Cervix: No gross lesions or discharge  Uterus  upper limits of normal, he regular shaped adnexa limited due to patient's abdominal girth see ultrasound report above.  Anus and perineum  normal   Rectovaginal  normal sphincter tone without palpated masses or tenderness             Hemoccult not done     Assessment/Plan:  40 y.o. female gravida 0 with history of recurrent leiomyomatous. Persistent left ovarian cyst normal C1 25. Suspect endometrioma. Patient with fundal fibroid measuring 6.5 x 6.4 x 5.6 cm. We went for  a detailed discussion of surgical options: 1. Abdominal myomectomy with left ovarian cystectomy or 2. Laparoscopic left ovarian cystectomy or 3. Total abdominal hysterectomy with left ovarian cystectomy  Patient previously been given information on the above subject. She is going to decide and calls back this week to determine which route she would like to proceed. We discussed that at time of her cystectomy she could potentially loose that ovary but all efforts will be made to conserve that ovary. We will  for a detailed discussion of the operation to include the following: The risk of infection, trauma and injury to internal organs, deep venous thrombosis and subsequent pulmonary embolism and death were discussed. She'll receive prophylactic antibiotic as well as PAS stockings. In the event that she would need blood or blood products potential risk of anaphylactic reactions hepatitis and AIDS were discussed. She will try to donate one unit of autologous blood this week. All questions are answered and we'll follow accordingly.    Ok Edwards MD, 1:30 PM 01/06/2011

## 2011-01-07 ENCOUNTER — Telehealth: Payer: Self-pay

## 2011-01-07 NOTE — Telephone Encounter (Signed)
Patient called to say she has decided to do Myomectomy and Ovarian Cystectomy as scheduled.  I have made arrangements for her to schedule her autologus blood donation.    Will you need to see her again?

## 2011-01-07 NOTE — Telephone Encounter (Signed)
Dr. Glenetta Hew told me that he will not need to see this patient again. The previous visit can serve as preop consult.  Order faxed to [facility] for autologus donation. ka

## 2011-01-09 ENCOUNTER — Encounter (HOSPITAL_COMMUNITY): Payer: Self-pay

## 2011-01-09 ENCOUNTER — Telehealth: Payer: Self-pay

## 2011-01-09 NOTE — Telephone Encounter (Signed)
Per Dr. Glenetta Hew it is up to patient. We can reschedule or she can go ahead with surgery. Not likely she will need autologus blood transfusion and if she did and it wasn't available Dr. Glenetta Hew very comfortable with Blood Bank Blood.  Pt. To consider and talk with her mom tonight. The optional resched date is Jan 7 at 1:00 and she will let me know in the am about that.

## 2011-01-09 NOTE — Telephone Encounter (Signed)
The [employer] only does Autologus Donation scheduling on Tuesday. That will only be nine days from her surgery and they cannot guarantee her donation will make it there for her surgery since they request 10d-2wks to process it.  Patient wants to discuss. (Her paper chart is in your box in the hall)

## 2011-01-10 ENCOUNTER — Telehealth: Payer: Self-pay

## 2011-01-10 NOTE — Telephone Encounter (Signed)
Since patient's case has now been rescheduled for January 7 she may donate 2 units autologous blood tab on standby at time of her myomectomy.

## 2011-01-10 NOTE — Telephone Encounter (Signed)
Patient decided to postpone her surgery until Jan 7th so that she can be sure that her autologus donation of blood is available. I rescheduled her to Jan 7 1pm at [facility]  Patient wants to know since now there is plenty of time if you would like her to donate two units instead of just one?

## 2011-01-10 NOTE — Telephone Encounter (Signed)
I left patient a message that  okay for 2 units. I refaxed the revised form to [employer] with a note that date changed and 2 units okay.  ka

## 2011-01-17 ENCOUNTER — Other Ambulatory Visit (HOSPITAL_COMMUNITY): Payer: PRIVATE HEALTH INSURANCE

## 2011-01-24 ENCOUNTER — Ambulatory Visit (INDEPENDENT_AMBULATORY_CARE_PROVIDER_SITE_OTHER): Payer: PRIVATE HEALTH INSURANCE | Admitting: Gynecology

## 2011-01-24 ENCOUNTER — Encounter: Payer: Self-pay | Admitting: Gynecology

## 2011-01-24 VITALS — BP 130/88

## 2011-01-24 DIAGNOSIS — N920 Excessive and frequent menstruation with regular cycle: Secondary | ICD-10-CM

## 2011-01-24 DIAGNOSIS — Z01818 Encounter for other preprocedural examination: Secondary | ICD-10-CM

## 2011-01-24 DIAGNOSIS — Z23 Encounter for immunization: Secondary | ICD-10-CM

## 2011-01-24 NOTE — Progress Notes (Signed)
Mindy Collins is an 40 y.o. female gravida 0 para 0 who presented to the office today for preoperative consultation physical examination. She had been seen the office on November 26 as a result of her heavy periods and left lower quadrant pain. We did discuss the ultrasound finding which demonstrated that her intramural myoma had increased to 65 x 64 x 56 mm and a smaller one measuring 16 x 23 mm, she had a normal right ovary but the left ovary there was an irregular wall measuring 30 x 26 mm with a cyst been echo-free avascular. There was a solid mass inferior medial to this wall measuring 36 x 27 x 38 mm negative color flow Doppler. In September of this year she had a normal CA 125 since we had been following the cyst which has not changed in size. Patient back in 2007 had a large fundal myoma and underwent a abdominal myomectomy. Chromopertubation attempted at that time did not demonstrate tubal patency although normal fallopian tubes bilateral. Patient interested in maintaining her fertility. She has donated one unit of autologous blood is in the process of donating her second unit next week and her surgery is scheduled for Monday, January 7.  Pertinent Gynecological History: Menses: flow is moderate and 7-10 days Bleeding: Heavy periods with clots Contraception: condoms DES exposure: denies Blood transfusions: none Sexually transmitted diseases: no past history Previous GYN Procedures: Abdominal myomectomy with chromopertubation  Last mammogram: Not indicated Date: Not indicated Last pap: normal Date: 2012 OB History: G 0, P 0   Menstrual History: Menarche age: 19 Patient's last menstrual period was 12/14/2010.    Past Medical History  Diagnosis Date  . Chest pain   . Depression   . HPV (human papilloma virus) infection     WARTS/ TCA [place]  . Dysmenorrhea   . Asthma   . Hypertension   . Obesity   . GERD (gastroesophageal reflux disease)     Past Surgical History  Procedure Date    . Gynecologic cryosurgery 1993    DYSPLASIA CERVIX/   . Myomectomy abdominal approach 04/31/2007    Family History  Problem Relation Age of Onset  . Hypertension Mother   . Heart disease Maternal Grandmother   . Cancer Father     LUNG  . Breast cancer Maternal Aunt   . Diabetes Maternal Grandfather     Social History:  reports that she has never smoked. She does not have any smokeless tobacco history on file. She reports that she does not drink alcohol. Her drug history not on file.  Allergies:  Allergies  Allergen Reactions  . Nexium Shortness Of Breath    Nervousness; tolerates Aciphex.  . Latex Itching     (Not in a hospital admission)  Review of systems: Positive for leiomyomatous uteri left lower quadrant pain and ovarian cyst and obesity. Also gastroesophageal reflux and hypertension.  Blood pressure 130/88, last menstrual period 12/14/2010.  Physical exam: HEENT: Unremarkable Lungs: Clear to auscultation Rogers or wheezes Heart: Regular rate and rhythm no murmurs or gallops Breasts: Not examined Abdomen: Pendulous Pfannenstiel scar present nontender Pelvic: Bartholin urethra Skene was within normal limits Vagina: No gross lesions on inspection Cervix: No gross lesions on inspection Uterus 10 week size irregular adnexa difficult to delineate due to patient's obesity Rectal: Not done  No results found for this or any previous visit (from the past 24 hour(s)).  No results found.  Assessment/Plan: 40 year old female with symptomatic leiomyomatous uteri. Back in 2007 she  had an abdominal myomectomy and chromopertubation. Tubal patency was not established before or after the myomectomy and the uterine cavity had not been entered. Gross appearance fallopian tubes appeared to be normal at that time. Patient now with recurrence of her fibroids measuring 65 x 64 x 56 mm and the second 16 x 23 mm as well as a persistent left ovarian cyst measuring 36 x 27 x 38 mm with  normal CA 125. Patient scheduled to undergo abdominal myomectomy left ovarian cystectomy possible left salpingo-oophorectomy along with chromopertubation. Patient has donated one unit of autologous blood and is in the process of donating her second unit. The risks benefits and pros and cons of the operation were discussed in detail to include the following: The risk of deep venous thrombosis and subsequent pulmonary embolism were discussed and for this reason she will have PSA stockings. Also the risk of infection although she will receive prophylaxis antibiotic. Also there is a risk of injury to internal organs requiring corrective surgery at that time. And in the event of uncontrollable hemorrhage and she would need additional blood more than 2 units and she has donated there is risk of anaphylactic reaction hepatitis and AIDS as a result of donor blood. As a life-saving measure she would lose all her reproductive organs she would never be of actual in the future as well. We'll also do chromopertubation again to see if tubal patency is present. If not 6 months down the line we'll proceed with an outpatient HSG. If tubes are still blocked have discussed with her today that in the near future which she plans on getting pregnant and she would be a candidate then for in vitro fertilization. All these issues were discussed in detail with the patient all questions rancher will follow accordingly.  Ok Edwards 01/24/2011, 4:38 PM

## 2011-01-24 NOTE — Patient Instructions (Signed)
Remember not to take any Asprin or nonsteroidal medication 1 week prior to surgery. Take an iron tablet daily.

## 2011-02-12 ENCOUNTER — Encounter (HOSPITAL_COMMUNITY)
Admission: RE | Admit: 2011-02-12 | Discharge: 2011-02-12 | Disposition: A | Discharge status: Home / Self Care | Payer: PRIVATE HEALTH INSURANCE | Source: Ambulatory Visit | Attending: Gynecology | Admitting: Gynecology

## 2011-02-12 ENCOUNTER — Encounter (HOSPITAL_COMMUNITY): Payer: Self-pay

## 2011-02-12 HISTORY — DX: Other seasonal allergic rhinitis: J30.2

## 2011-02-12 HISTORY — DX: Fatty (change of) liver, not elsewhere classified: K76.0

## 2011-02-12 HISTORY — DX: Anemia, unspecified: D64.9

## 2011-02-12 LAB — CBC
HCT: 34.9 % — ABNORMAL LOW (ref 36.0–46.0)
Hemoglobin: 11.4 g/dL — ABNORMAL LOW (ref 12.0–15.0)
MCH: 27.5 pg (ref 26.0–34.0)
MCHC: 32.7 g/dL (ref 30.0–36.0)
MCV: 84.1 fL (ref 78.0–100.0)
Platelets: 375 10*3/uL (ref 150–400)
RBC: 4.15 MIL/uL (ref 3.87–5.11)
RDW: 15.4 % (ref 11.5–15.5)
WBC: 14.2 10*3/uL — ABNORMAL HIGH (ref 4.0–10.5)

## 2011-02-12 LAB — BASIC METABOLIC PANEL
BUN: 10 mg/dL (ref 6–23)
CO2: 28 mEq/L (ref 19–32)
Calcium: 9.3 mg/dL (ref 8.4–10.5)
Chloride: 102 mEq/L (ref 96–112)
Creatinine, Ser: 0.68 mg/dL (ref 0.50–1.10)
GFR calc Af Amer: >90 mL/min (ref >90)
GFR calc non Af Amer: >90 mL/min (ref >90)
Glucose, Bld: 97 mg/dL (ref 70–99)
Potassium: 3.8 mEq/L (ref 3.5–5.1)
Sodium: 137 mEq/L (ref 135–145)

## 2011-02-12 LAB — SURGICAL PCR SCREEN
MRSA, PCR: NEGATIVE
Staphylococcus aureus: POSITIVE — AB

## 2011-02-12 NOTE — Pre-Procedure Instructions (Signed)
Pt had episode of chest pain 01/04/2011-seen as WLER-cardiac ruled out per pt.

## 2011-02-12 NOTE — Patient Instructions (Addendum)
   Your procedure is scheduled on: Monday January 7th  Enter through the [employer] [facility] at: 11:30am Pick up the phone at the desk and dial [phone] and inform us of your arrival.  Please call this number if you have any problems the morning of surgery: [phone]  Remember: Do not eat food after midnight: Sunday Do not drink clear liquids after :Monday at 9am Take these medicines the morning of surgery with a SIP OF WATER: benicar and aciphex  Do not wear jewelry, make-up, or FINGER nail polish Do not wear lotions, powders, perfumes or deodorant. Do not shave 48 hours prior to surgery. Do not bring valuables to the hospital.  Leave suitcase in the car. After Surgery it may be brought to your room. For patients being admitted to the hospital, checkout time is 11:00am the day of discharge.  Patients discharged on the day of surgery will not be allowed to drive home.     Remember to use your hibiclens as instructed.Please shower with 1/2 bottle the evening before your surgery and the other 1/2 bottle the morning of surgery.

## 2011-02-14 ENCOUNTER — Encounter: Payer: Self-pay | Admitting: Oncology

## 2011-02-14 ENCOUNTER — Ambulatory Visit (HOSPITAL_BASED_OUTPATIENT_CLINIC_OR_DEPARTMENT_OTHER): Payer: PRIVATE HEALTH INSURANCE | Admitting: Oncology

## 2011-02-14 ENCOUNTER — Telehealth: Payer: Self-pay | Admitting: Oncology

## 2011-02-14 ENCOUNTER — Other Ambulatory Visit: Payer: PRIVATE HEALTH INSURANCE | Admitting: Lab

## 2011-02-14 ENCOUNTER — Ambulatory Visit: Payer: PRIVATE HEALTH INSURANCE

## 2011-02-14 ENCOUNTER — Other Ambulatory Visit: Payer: Self-pay | Admitting: Oncology

## 2011-02-14 VITALS — BP 129/77 | HR 88 | Temp 97.4°F | Ht 61.5 in | Wt 254.7 lb

## 2011-02-14 DIAGNOSIS — J329 Chronic sinusitis, unspecified: Secondary | ICD-10-CM

## 2011-02-14 DIAGNOSIS — D649 Anemia, unspecified: Secondary | ICD-10-CM

## 2011-02-14 DIAGNOSIS — N920 Excessive and frequent menstruation with regular cycle: Secondary | ICD-10-CM

## 2011-02-14 DIAGNOSIS — D259 Leiomyoma of uterus, unspecified: Secondary | ICD-10-CM

## 2011-02-14 DIAGNOSIS — D72829 Elevated white blood cell count, unspecified: Secondary | ICD-10-CM

## 2011-02-14 LAB — CBC WITH DIFFERENTIAL/PLATELET
BASO%: 1.4 % (ref 0.0–2.0)
Basophils Absolute: 0.1 10*3/uL (ref 0.0–0.1)
EOS%: 0.2 % (ref 0.0–7.0)
Eosinophils Absolute: 0 10*3/uL (ref 0.0–0.5)
HCT: 35.4 % (ref 34.8–46.6)
HGB: 11.5 g/dL — ABNORMAL LOW (ref 11.6–15.9)
LYMPH%: 15.9 % (ref 14.0–49.7)
MCH: 27.1 pg (ref 25.1–34.0)
MCHC: 32.5 g/dL (ref 31.5–36.0)
MCV: 83.2 fL (ref 79.5–101.0)
MONO#: 0.8 10*3/uL (ref 0.1–0.9)
MONO%: 7.3 % (ref 0.0–14.0)
NEUT#: 8.1 10*3/uL — ABNORMAL HIGH (ref 1.5–6.5)
NEUT%: 75.2 % (ref 38.4–76.8)
Platelets: 374 10*3/uL (ref 145–400)
RBC: 4.26 10*6/uL (ref 3.70–5.45)
RDW: 15.2 % — ABNORMAL HIGH (ref 11.2–14.5)
WBC: 10.8 10*3/uL — ABNORMAL HIGH (ref 3.9–10.3)
lymph#: 1.7 10*3/uL (ref 0.9–3.3)

## 2011-02-14 LAB — CHCC SMEAR

## 2011-02-14 LAB — MORPHOLOGY: PLT EST: ADEQUATE

## 2011-02-14 NOTE — Telephone Encounter (Signed)
Per Mindy Collins the pt is aware of the appt for today

## 2011-02-14 NOTE — Progress Notes (Signed)
Mindy Collins, Mindy Collins is a 41 yo African American woman with history of uterine fundal myoma, colisis NOS, chronic sinusitis, and chronic leukocytosis.  Per her recollection, she has had chronic leukocytosis with WBC up to 12-14 for many years.  Per our record, it was as high as 20 in May 2011.  She has had more problem of uterine fibroid bleeding.  She is due to have abdominal myomectomy, left ovarian cystectomy possible left salpingo-oophorectomy along with chromopertubation on Mon 02/17/2011.  She had routine CBC on 02/12/2011 with WBC 14.2.  Thus, she was kindly referred for evaluation of her leukocytosis.  She has menometrorrhagia.  She has also lower abdominal and pelvic cramp.  She has not had diarrhea, melena, hematochezia. She denies anorexia, fever, night sweat, weight loss, mucositis, gum bleeding, cough, SOB, palpitation, chest pain, jaundice, nausea, vomiting, hemoptysis, skin rash, joint swelling.      Past Medical History  Diagnosis Date  . HPV (human papilloma virus) infection     WARTS/ TCA [place]  . Dysmenorrhea   . Hypertension   . Obesity   . GERD (gastroesophageal reflux disease)   . Seasonal allergies   . Anemia   . Fatty liver   . Colitis   :    Past Surgical History  Procedure Date  . Gynecologic cryosurgery 1993    DYSPLASIA CERVIX/   . Myomectomy abdominal approach 04/31/2007  :   CURRENT MEDS: Current Outpatient Prescriptions  Medication Sig Dispense Refill  . Ferrous Sulfate 140 (45 FE) MG TBCR Take 1 tablet by mouth daily.        . Multiple Vitamin (MULTIVITAMIN) tablet Take 1 tablet by mouth daily.        . mupirocin ointment (BACTROBAN) 2 % Apply 1 application topically 2 (two) times daily. Apply to insides of both notrils twice daily for 5 days       . olmesartan-hydrochlorothiazide (BENICAR HCT) 20-12.5 MG per tablet  Take 1 tablet by mouth daily.        . RABEprazole (ACIPHEX) 20 MG tablet Take 20 mg by mouth daily.        . fluticasone (FLONASE) 50 MCG/ACT nasal spray Place 2 sprays into the nose daily.            Allergies  Allergen Reactions  . Nexium Shortness Of Breath    Nervousness; tolerates Aciphex.  . Latex Itching    Skin blistering  :  Family History  Problem Relation Age of Onset  . Hypertension Mother   . Heart disease Maternal Grandmother   . Cancer Father     LUNG  . Breast cancer Maternal Aunt   . Diabetes Maternal Grandfather   :  History   Social History  . Marital Status: Single    Spouse Name: N/A    Number of Children: 0  . Years of Education: N/A   Occupational History  . 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 Smoking status: Never Smoker   . Smokeless tobacco: Never Used   Comment: denies tobacco   . Alcohol Use: No  . Drug Use: No  . Sexually Active: No   Other Topics Concern  . Not on file   Social History Narrative  . No narrative on file    REVIEW OF SYSTEM:  The  rest of the 14-point review of sytem was negative.   Exam:  General:  Mildly obese woman in no acute distress.  Eyes:  no scleral icterus.  ENT:  There were no oropharyngeal lesions.  Neck was without thyromegaly.  Lymphatics:  Negative cervical, supraclavicular or axillary adenopathy.  Respiratory: lungs were clear bilaterally without wheezing or crackles.  Cardiovascular:  Regular rate and rhythm, S1/S2, without murmur, rub or gallop.  There was no pedal edema.  GI:  abdomen was soft, flat, nontender, nondistended, without organomegaly.  Muscoloskeletal:  no spinal tenderness of palpation of vertebral spine.  Skin exam was without echymosis, petichae.  Neuro exam was nonfocal.  Patient was able to get on and off exam table without assistance.  Gait was normal.  Patient was alerted and oriented.  Attention was good.   Language was appropriate.   Mood was normal without depression.  Speech was not pressured.  Thought content was not tangential.    LABS:  Lab Results  Component Value Date   WBC 10.8* 02/14/2011   HGB 11.5* 02/14/2011   HCT 35.4 02/14/2011   PLT 374 02/14/2011   GLUCOSE 97 02/12/2011   CHOL 201* 04/16/2010   TRIG 111.0 04/16/2010   HDL 48.00 04/16/2010   LDLDIRECT 141.6 04/16/2010   ALT 19 06/17/2009   AST 22 06/17/2009   NA 137 02/12/2011   K 3.8 02/12/2011   CL 102 02/12/2011   CREATININE 0.68 02/12/2011   BUN 10 02/12/2011   CO2 28 02/12/2011   TSH 1.69 04/16/2010    Blood smear review:   I personally reviewed the patient's peripheral blood smear today.  There was isocytosis.  There was no peripheral blast.  There was no schistocytosis, spherocytosis, target cell, rouleaux formation, tear drop cell.  There was no giant platelets or platelet clumps.     ASSESSMENT AND PLAN:   1.  Chronic sinusitis:  Not active today. 2.  History of presumed colitis in 2011:  Resolved.  She does not have family history of colon cancer.  I discussed with her that in the future, if she develops recurrent hematochezia, she should be referred to GI for consideration of colonoscopy.  3.  Chronic leukocytosis, neutrophil-predominant:  Most likely reactive to her chronic inflammatory states.  This has been going on for a while.  My review of her peripheral blood smear today did not show immature WBC or left shift.  Her WBC today returned to 10.8 which is even below her baseline.  I have low clinical suspicion for lymphoproliferative disease or myeloproliferative disease.  No further work up is indicated at this time.  I advised Mindy Collins that in the future, if her WBC >20 or he has concerning B-symptoms, then she should return to [place] for further evaluation.  4.  Mild anemia:  2/2 menometrorrhagia.  She is on oral iron.  5.  Fundal myomata:  With menometrorrhagia.  There is no contraindication from hematology standpoint to proceed with her operation on Monday 02/17/11.    6.  Disposition:  Discharged from clinic.  Return prn.    Thank you for this referral.   The length of time of the face-to-face encounter was 30 minutes. More than 50% of time was spent counseling and coordination of care.

## 2011-02-17 ENCOUNTER — Inpatient Hospital Stay (HOSPITAL_COMMUNITY)
Admission: RE | Admit: 2011-02-17 | Discharge: 2011-02-19 | DRG: 743 | Disposition: A | Discharge status: Home / Self Care | Payer: PRIVATE HEALTH INSURANCE | Source: Ambulatory Visit | Attending: Gynecology | Admitting: Gynecology

## 2011-02-17 ENCOUNTER — Encounter (HOSPITAL_COMMUNITY)
Admission: RE | Disposition: A | Discharge status: Home / Self Care | Payer: Self-pay | Source: Ambulatory Visit | Attending: Gynecology

## 2011-02-17 ENCOUNTER — Encounter (HOSPITAL_COMMUNITY): Payer: Self-pay | Admitting: General Practice

## 2011-02-17 ENCOUNTER — Other Ambulatory Visit: Payer: Self-pay | Admitting: Gynecology

## 2011-02-17 ENCOUNTER — Encounter (HOSPITAL_COMMUNITY): Payer: Self-pay | Admitting: Anesthesiology

## 2011-02-17 ENCOUNTER — Inpatient Hospital Stay (HOSPITAL_COMMUNITY): Payer: PRIVATE HEALTH INSURANCE | Admitting: Anesthesiology

## 2011-02-17 DIAGNOSIS — N83209 Unspecified ovarian cyst, unspecified side: Secondary | ICD-10-CM | POA: Diagnosis present

## 2011-02-17 DIAGNOSIS — D259 Leiomyoma of uterus, unspecified: Secondary | ICD-10-CM | POA: Diagnosis present

## 2011-02-17 DIAGNOSIS — N920 Excessive and frequent menstruation with regular cycle: Secondary | ICD-10-CM | POA: Diagnosis present

## 2011-02-17 DIAGNOSIS — I10 Essential (primary) hypertension: Secondary | ICD-10-CM | POA: Diagnosis present

## 2011-02-17 DIAGNOSIS — D279 Benign neoplasm of unspecified ovary: Principal | ICD-10-CM | POA: Diagnosis present

## 2011-02-17 DIAGNOSIS — R079 Chest pain, unspecified: Secondary | ICD-10-CM

## 2011-02-17 DIAGNOSIS — N949 Unspecified condition associated with female genital organs and menstrual cycle: Secondary | ICD-10-CM | POA: Diagnosis present

## 2011-02-17 DIAGNOSIS — D649 Anemia, unspecified: Secondary | ICD-10-CM

## 2011-02-17 DIAGNOSIS — N946 Dysmenorrhea, unspecified: Secondary | ICD-10-CM | POA: Diagnosis present

## 2011-02-17 DIAGNOSIS — R1900 Intra-abdominal and pelvic swelling, mass and lump, unspecified site: Secondary | ICD-10-CM

## 2011-02-17 HISTORY — PX: MYOMECTOMY: SHX85

## 2011-02-17 HISTORY — PX: OVARIAN CYST REMOVAL: SHX89

## 2011-02-17 LAB — TYPE AND SCREEN
ABO/RH(D): O POS
Antibody Screen: NEGATIVE
Unit division: 0
Unit division: 0

## 2011-02-17 LAB — URINALYSIS, ROUTINE W REFLEX MICROSCOPIC
Bilirubin Urine: NEGATIVE
Glucose, UA: NEGATIVE mg/dL
Ketones, ur: NEGATIVE mg/dL
Leukocytes, UA: NEGATIVE
Nitrite: NEGATIVE
Protein, ur: NEGATIVE mg/dL
Specific Gravity, Urine: 1.02 (ref 1.005–1.030)
Urobilinogen, UA: 0.2 mg/dL (ref 0.0–1.0)
pH: 5.5 (ref 5.0–8.0)

## 2011-02-17 LAB — URINE MICROSCOPIC-ADD ON

## 2011-02-17 LAB — PREGNANCY, URINE: Preg Test, Ur: NEGATIVE

## 2011-02-17 SURGERY — MYOMECTOMY, ABDOMINAL APPROACH
Anesthesia: General

## 2011-02-17 MED ORDER — FENTANYL CITRATE 0.05 MG/ML IJ SOLN
INTRAMUSCULAR | Status: DC | PRN
  Administered 2011-02-17 (×3): 50 ug via INTRAVENOUS
  Administered 2011-02-17: 100 ug via INTRAVENOUS
  Administered 2011-02-17 (×2): 50 ug via INTRAVENOUS

## 2011-02-17 MED ORDER — LACTATED RINGERS IV SOLN
INTRAVENOUS | Status: DC
  Administered 2011-02-17 (×3): via INTRAVENOUS

## 2011-02-17 MED ORDER — DIPHENHYDRAMINE HCL 12.5 MG/5ML PO ELIX
12.5000 mg | ORAL_SOLUTION | Freq: Four times a day (QID) | ORAL | Status: DC | PRN
  Administered 2011-02-17: 12.5 mg via ORAL
  Filled 2011-02-17: qty 5

## 2011-02-17 MED ORDER — SCOPOLAMINE 1 MG/3DAYS TD PT72
1.0000 | MEDICATED_PATCH | Freq: Once | TRANSDERMAL | Status: DC
  Administered 2011-02-17: 1.5 mg via TRANSDERMAL

## 2011-02-17 MED ORDER — HYDROMORPHONE HCL PF 1 MG/ML IJ SOLN
0.2500 mg | INTRAMUSCULAR | Status: DC | PRN
  Administered 2011-02-17 (×4): 0.5 mg via INTRAVENOUS

## 2011-02-17 MED ORDER — LACTATED RINGERS IV SOLN
INTRAVENOUS | Status: DC
  Administered 2011-02-17: 50 mL/h via INTRAVENOUS

## 2011-02-17 MED ORDER — HYDROMORPHONE HCL PF 1 MG/ML IJ SOLN
INTRAMUSCULAR | Status: DC | PRN
  Administered 2011-02-17 (×2): 0.5 mg via INTRAVENOUS

## 2011-02-17 MED ORDER — PROPOFOL 10 MG/ML IV EMUL
INTRAVENOUS | Status: DC | PRN
  Administered 2011-02-17: 200 mg via INTRAVENOUS

## 2011-02-17 MED ORDER — MIDAZOLAM HCL 5 MG/5ML IJ SOLN
INTRAMUSCULAR | Status: DC | PRN
  Administered 2011-02-17 (×2): 1 mg via INTRAVENOUS

## 2011-02-17 MED ORDER — ONDANSETRON HCL 4 MG/2ML IJ SOLN
INTRAMUSCULAR | Status: DC | PRN
  Administered 2011-02-17: 4 mg via INTRAVENOUS

## 2011-02-17 MED ORDER — ROCURONIUM BROMIDE 100 MG/10ML IV SOLN
INTRAVENOUS | Status: DC | PRN
  Administered 2011-02-17: 50 mg via INTRAVENOUS
  Administered 2011-02-17 (×3): 10 mg via INTRAVENOUS

## 2011-02-17 MED ORDER — NEOSTIGMINE METHYLSULFATE 1 MG/ML IJ SOLN
INTRAMUSCULAR | Status: DC | PRN
  Administered 2011-02-17: 5 mg via INTRAVENOUS

## 2011-02-17 MED ORDER — MORPHINE SULFATE (PF) 1 MG/ML IV SOLN
INTRAVENOUS | Status: DC
  Administered 2011-02-17: 23.88 mg via INTRAVENOUS
  Administered 2011-02-17: 19.5 mL via INTRAVENOUS
  Administered 2011-02-17 (×2): via INTRAVENOUS
  Administered 2011-02-18: 4.5 mg via INTRAVENOUS
  Administered 2011-02-18: 6 mg via INTRAVENOUS
  Filled 2011-02-17 (×2): qty 25

## 2011-02-17 MED ORDER — CEFAZOLIN SODIUM 1-5 GM-% IV SOLN
INTRAVENOUS | Status: AC
  Filled 2011-02-17: qty 50

## 2011-02-17 MED ORDER — MEPERIDINE HCL 25 MG/ML IJ SOLN: 6.2500 mg | INTRAMUSCULAR | Status: DC | PRN

## 2011-02-17 MED ORDER — METHYLENE BLUE 1 % INJ SOLN
INTRAMUSCULAR | Status: DC | PRN
  Administered 2011-02-17: 1 mL via SUBMUCOSAL

## 2011-02-17 MED ORDER — LIDOCAINE HCL (CARDIAC) 20 MG/ML IV SOLN
INTRAVENOUS | Status: DC | PRN
  Administered 2011-02-17: 80 mg via INTRAVENOUS

## 2011-02-17 MED ORDER — PROMETHAZINE HCL 25 MG/ML IJ SOLN: 6.2500 mg | INTRAMUSCULAR | Status: DC | PRN

## 2011-02-17 MED ORDER — LACTATED RINGERS IV SOLN
Freq: Once | INTRAVENOUS | Status: DC
  Filled 2011-02-17: qty 1000

## 2011-02-17 MED ORDER — DIPHENHYDRAMINE HCL 50 MG/ML IJ SOLN: 12.5000 mg | Freq: Four times a day (QID) | INTRAMUSCULAR | Status: DC | PRN

## 2011-02-17 MED ORDER — BUPIVACAINE HCL (PF) 0.25 % IJ SOLN
INTRAMUSCULAR | Status: DC | PRN
  Administered 2011-02-17: 10 mL

## 2011-02-17 MED ORDER — ONDANSETRON HCL 4 MG/2ML IJ SOLN: 4.0000 mg | Freq: Four times a day (QID) | INTRAMUSCULAR | Status: DC | PRN

## 2011-02-17 MED ORDER — DEXAMETHASONE SODIUM PHOSPHATE 10 MG/ML IJ SOLN
INTRAMUSCULAR | Status: DC | PRN
  Administered 2011-02-17: 10 mg via INTRAVENOUS

## 2011-02-17 MED ORDER — VASOPRESSIN 20 UNIT/ML IJ SOLN
Freq: Once | INTRAVENOUS | Status: DC
  Filled 2011-02-17: qty 20

## 2011-02-17 MED ORDER — LACTATED RINGERS IV SOLN
INTRAVENOUS | Status: DC
  Administered 2011-02-18: via INTRAVENOUS

## 2011-02-17 MED ORDER — GLYCOPYRROLATE 0.2 MG/ML IJ SOLN
INTRAMUSCULAR | Status: DC | PRN
  Administered 2011-02-17: 0.1 mg via INTRAVENOUS
  Administered 2011-02-17: 1 mg via INTRAVENOUS

## 2011-02-17 MED ORDER — SODIUM CHLORIDE 0.9 % IJ SOLN: 9.0000 mL | INTRAMUSCULAR | Status: DC | PRN

## 2011-02-17 MED ORDER — SCOPOLAMINE 1 MG/3DAYS TD PT72
MEDICATED_PATCH | TRANSDERMAL | Status: AC
  Filled 2011-02-17: qty 1

## 2011-02-17 MED ORDER — CEFAZOLIN SODIUM 1-5 GM-% IV SOLN
1.0000 g | INTRAVENOUS | Status: AC
  Administered 2011-02-17: 1 g via INTRAVENOUS

## 2011-02-17 MED ORDER — VASOPRESSIN 20 UNIT/ML IJ SOLN
INTRAMUSCULAR | Status: DC | PRN
  Administered 2011-02-17: 4 [IU]

## 2011-02-17 MED ORDER — NALOXONE HCL 0.4 MG/ML IJ SOLN: 0.4000 mg | INTRAMUSCULAR | Status: DC | PRN

## 2011-02-17 SURGICAL SUPPLY — 57 items
ADAPTER CATH SYR TO TUBING 38M (ADAPTER) IMPLANT
ADPR CATH LL SYR 3/32 TPR (ADAPTER)
APL SKNCLS STERI-STRIP NONHPOA (GAUZE/BANDAGES/DRESSINGS)
BARRIER ADHS 3X4 INTERCEED (GAUZE/BANDAGES/DRESSINGS) ×1 IMPLANT
BENZOIN TINCTURE PRP APPL 2/3 (GAUZE/BANDAGES/DRESSINGS) IMPLANT
BRR ADH 4X3 ABS CNTRL BYND (GAUZE/BANDAGES/DRESSINGS) ×2
CANISTER SUCTION 2500CC (MISCELLANEOUS) ×3 IMPLANT
CATH FOLEY 2WAY  3CC  8FR (CATHETERS) ×1
CATH FOLEY 2WAY 3CC 8FR (CATHETERS) ×2 IMPLANT
CELLS DAT CNTRL 66122 CELL SVR (MISCELLANEOUS) IMPLANT
CLOTH BEACON ORANGE TIMEOUT ST (SAFETY) ×3 IMPLANT
CONT PATH 16OZ SNAP LID 3702 (MISCELLANEOUS) ×3 IMPLANT
DECANTER SPIKE VIAL GLASS SM (MISCELLANEOUS) ×6 IMPLANT
DRAPE SURG 17X11 SM STRL (DRAPES) ×3 IMPLANT
DRESSING TELFA 8X3 (GAUZE/BANDAGES/DRESSINGS) ×4 IMPLANT
DRSG PAD ABDOMINAL 8X10 ST (GAUZE/BANDAGES/DRESSINGS) ×1 IMPLANT
ELECT BLADE 6 FLAT ULTRCLN (ELECTRODE) IMPLANT
ELECT NDL TIP 2.8 STRL (NEEDLE) IMPLANT
ELECT NEEDLE TIP 2.8 STRL (NEEDLE) ×3 IMPLANT
GAUZE SPONGE 4X4 12PLY STRL LF (GAUZE/BANDAGES/DRESSINGS) ×5 IMPLANT
GAUZE SPONGE 4X4 16PLY XRAY LF (GAUZE/BANDAGES/DRESSINGS) ×6 IMPLANT
GLOVE BIOGEL PI IND STRL 8 (GLOVE) ×2 IMPLANT
GLOVE BIOGEL PI INDICATOR 8 (GLOVE) ×1
GLOVE ECLIPSE 7.5 STRL STRAW (GLOVE) ×6 IMPLANT
GOWN PREVENTION PLUS LG XLONG (DISPOSABLE) ×9 IMPLANT
IV STOPCOCK 4 WAY 40  W/Y SET (IV SOLUTION) ×2
IV STOPCOCK 4 WAY 40 W/Y SET (IV SOLUTION) ×4 IMPLANT
NDL HYPO 25X1 1.5 SAFETY (NEEDLE) ×4 IMPLANT
NEEDLE HYPO 25X1 1.5 SAFETY (NEEDLE) ×6 IMPLANT
NS IRRIG 1000ML POUR BTL (IV SOLUTION) ×3 IMPLANT
PACK ABDOMINAL GYN (CUSTOM PROCEDURE TRAY) ×3 IMPLANT
PAD OB MATERNITY 4.3X12.25 (PERSONAL CARE ITEMS) ×3 IMPLANT
RETRACTOR WND ALEXIS 18 MED (MISCELLANEOUS) IMPLANT
RETRACTOR WND ALEXIS 25 LRG (MISCELLANEOUS) IMPLANT
RINGERS IRRIG 1000ML POUR BTL (IV SOLUTION) ×6 IMPLANT
RTRCTR WOUND ALEXIS 18CM MED (MISCELLANEOUS)
RTRCTR WOUND ALEXIS 25CM LRG (MISCELLANEOUS)
SPONGE LAP 18X18 X RAY DECT (DISPOSABLE) ×4 IMPLANT
STAPLER VISISTAT 35W (STAPLE) ×3 IMPLANT
STRIP CLOSURE SKIN 1/2X4 (GAUZE/BANDAGES/DRESSINGS) IMPLANT
SUT VIC AB 0 CT1 27 (SUTURE) ×6
SUT VIC AB 0 CT1 27XBRD ANBCTR (SUTURE) ×4 IMPLANT
SUT VIC AB 1 CT1 36 (SUTURE) IMPLANT
SUT VIC AB 2-0 CT1 27 (SUTURE) ×15
SUT VIC AB 2-0 CT1 TAPERPNT 27 (SUTURE) ×4 IMPLANT
SUT VIC AB 3-0 CT1 27 (SUTURE) ×21
SUT VIC AB 3-0 CT1 TAPERPNT 27 (SUTURE) ×14 IMPLANT
SUT VIC AB 3-0 SH 27 (SUTURE)
SUT VIC AB 3-0 SH 27X BRD (SUTURE) IMPLANT
SUT VIC AB 4-0 KS 27 (SUTURE) IMPLANT
SUT VIC AB 4-0 PS2 27 (SUTURE) IMPLANT
SUT VICRYL 0 TIES 12 18 (SUTURE) ×3 IMPLANT
SYR 50ML LL SCALE MARK (SYRINGE) ×3 IMPLANT
SYR CONTROL 10ML LL (SYRINGE) ×6 IMPLANT
TOWEL OR 17X24 6PK STRL BLUE (TOWEL DISPOSABLE) ×6 IMPLANT
TRAY FOLEY CATH 14FR (SET/KITS/TRAYS/PACK) ×3 IMPLANT
WATER STERILE IRR 1000ML POUR (IV SOLUTION) ×3 IMPLANT

## 2011-02-17 NOTE — Transfer of Care (Signed)
Immediate Anesthesia Transfer of Care Note  Patient: Mindy Collins  Procedure(s) Performed:  MYOMECTOMY - I am hoping to get a 7:30am time on Dec 4, Dec 11 or Dec 12. It not available I will check on some 1:00 pm times. Thanks; OVARIAN CYSTECTOMY  Patient Location: PACU  Anesthesia Type: General  Level of Consciousness: awake, alert  and oriented  Airway & Oxygen Therapy: Patient Spontanous Breathing and Patient connected to nasal cannula oxygen  Post-op Assessment: Report given to PACU RN and Post -op Vital signs reviewed and stable  Post vital signs: Reviewed and stable  Complications: No apparent anesthesia complications

## 2011-02-17 NOTE — Op Note (Signed)
02/17/2011  3:28 PM  PATIENT:  Mindy Collins  41 y.o. female  PRE-OPERATIVE DIAGNOSIS:  fibroids, left  ovarian cyst  POST-OPERATIVE DIAGNOSIS:  fibroids, left ovarian cyst  PROCEDURE:  Procedure(s): Abdominal MYOMECTOMY and left OVARIAN CYSTECTOMY  SURGEON:  Surgeon(s): Ok Edwards, MD Dara Lords, MD  ANESTHESIA:   general  FINDINGS:  DESCRIPTION OF OPERATION: The patient was taken to the operating room where she underwent successful general endotracheal anesthesia. Patient had received a gram of Cefotan in route to the operating room. She also had PSA stockings for DVT prophylaxis. The abdomen vagina and perineum were prepped and draped in usual sterile fashion. A Foley catheter had been inserted into the bladder to monitor urinary output and a small pediatric Foley catheter was inserted into the uterus with an attachment for chromopertubation with methylene blue dye. After the drapes were in place a Pfannenstiel incision was made 2 cm above the symphysis pubis the incision was carried down to the skin down to subcutaneous tissue down to the rectus fascia were by midline neck was made in the fascia was incised in a transverse fashion meticulous dissection was required to into the peritoneal cavity. Patient was then placed in Trendelenburg position and the O'Connor-O'Sullivan retractors were in place. It was noted at this time the regular contour of the fundus of the uterus as well as the right fallopian tube was adhered near the fundus are previous myomectomy had been undertaken. The left side of the uterus was adhered to the pelvic sidewall. The fallopian tube and ovary were noted. A 3 cm solid ovarian cyst was noted. Both tubes had fimbriated ends. Attempted chromopertubation unsuccessful with no dye spilling on either tube. Meticulous dissection to free the right fallopian tube from the fundus of the uterus was undertaken. 10 cc of Pitressin (4 units per 20 20 cc of lactated  Ringer's) was infiltrated to the fundus of the uterus where the vertical incision was to be made to enucleate the myoma. This was undertaken with a fine-tip Bovie scoring the fundus of the uterus and with the use of a single-tooth tenaculum placed on the myoma the 6/2 cm myoma was enucleated from the fundus and passed off the operative field and submitted for histological evaluation. No other fibroids were palpated and the uterus was reapproximated in a layered fashion with 2-0 Vicryl suture bringing together the myometrium. Of note the endometrial cavity was not entered. The serosa was then reapproximated with imbricating stitches of 3-0 Vicryl suture. Attention was then placed to the left solid ovarian mass and with a fine-tip Bovie the capsule was scored in the solid ovarian cyst was removed entirely and submitted for histological evaluation as well the remaining ovarian capsule was reapproximated with an imbricating stitch of 3-0 Vicryl suture. The pelvic cavity was then copiously irrigated with normal saline solution scattered inflammatory inclusion cyst were excised and submitted for histological evaluation as well. Interceed was then placed over the incision of the fundus with a myomectomy had been undertaken after the pelvic cavity was copious irrigated with normal saline solution. At this point sponge count needle count were correct. The visceral peritoneum was not reapproximated. The rectus fascia was closed with a running stitch of 0 Vicryl suture. The subcutaneous bleeders both Bovie cauterized. The skin was close with interrupted skin clips. For postoperative analgesia quarter percent Marcaine was infiltrated at the incision site for a total of 10 cc. The patient's labor didn't put in lithotomy position the intrauterine catheter  was removed the cervix was inspected there was no bleeding. A pressure dressing was placed on the Pfannenstiel incision. The patient was awakened transferred to recovery in  stable vital sign . Urine output 550 cc's  ESTIMATED BLOOD LOSS: 225 cc's  Intake/Output Summary (Last 24 hours) at 02/17/11 1528 Last data filed at 02/17/11 1525  Gross per 24 hour  Intake   2600 ml  Output    775 ml  Net   1825 ml     BLOOD ADMINISTERED:none   LOCAL MEDICATIONS USED:  MARCAINE 10 CC subcutaneous incision site. Also Pitressin 4 units per 20 cc of lactated Ringer's was infiltrated into the myoma for a total of 10 cc.  SPECIMEN:  Source of Specimen:  Single 6 x 5 cm fundal fibroid and a 2.5 x 3 cm solid left ovarian cyst. Also scattered peritoneal inflammatory cysts  DISPOSITION OF SPECIMEN:  PATHOLOGY  COUNTS:  YES  PLAN OF CARE: Transfer to PACU  [facility] HMD3:28 PMTD@

## 2011-02-17 NOTE — H&P (View-Only) (Signed)
Mindy Collins is an 41 y.o. female gravida 0 para 0 who presented to the office today for preoperative consultation physical examination. She had been seen the office on November 26 as a result of her heavy periods and left lower quadrant pain. We did discuss the ultrasound finding which demonstrated that her intramural myoma had increased to 65 x 64 x 56 mm and a smaller one measuring 16 x 23 mm, she had a normal right ovary but the left ovary there was an irregular wall measuring 30 x 26 mm with a cyst been echo-free avascular. There was a solid mass inferior medial to this wall measuring 36 x 27 x 38 mm negative color flow Doppler. In September of this year she had a normal CA 125 since we had been following the cyst which has not changed in size. Patient back in 2007 had a large fundal myoma and underwent a abdominal myomectomy. Chromopertubation attempted at that time did not demonstrate tubal patency although normal fallopian tubes bilateral. Patient interested in maintaining her fertility. She has donated one unit of autologous blood is in the process of donating her second unit next week and her surgery is scheduled for Monday, January 7.  Pertinent Gynecological History: Menses: flow is moderate and 7-10 days Bleeding: Heavy periods with clots Contraception: condoms DES exposure: denies Blood transfusions: none Sexually transmitted diseases: no past history Previous GYN Procedures: Abdominal myomectomy with chromopertubation  Last mammogram: Not indicated Date: Not indicated Last pap: normal Date: 2012 OB History: G 0, P 0   Menstrual History: Menarche age: 14 Patient's last menstrual period was 12/14/2010.    Past Medical History  Diagnosis Date  . Chest pain   . Depression   . HPV (human papilloma virus) infection     WARTS/ TCA [place]  . Dysmenorrhea   . Asthma   . Hypertension   . Obesity   . GERD (gastroesophageal reflux disease)     Past Surgical History  Procedure Date    . Gynecologic cryosurgery 1993    DYSPLASIA CERVIX/   . Myomectomy abdominal approach 04/31/2007    Family History  Problem Relation Age of Onset  . Hypertension Mother   . Heart disease Maternal Grandmother   . Cancer Father     LUNG  . Breast cancer Maternal Aunt   . Diabetes Maternal Grandfather     Social History:  reports that she has never smoked. She does not have any smokeless tobacco history on file. She reports that she does not drink alcohol. Her drug history not on file.  Allergies:  Allergies  Allergen Reactions  . Nexium Shortness Of Breath    Nervousness; tolerates Aciphex.  . Latex Itching     (Not in a hospital admission)  Review of systems: Positive for leiomyomatous uteri left lower quadrant pain and ovarian cyst and obesity. Also gastroesophageal reflux and hypertension.  Blood pressure 130/88, last menstrual period 12/14/2010.  Physical exam: HEENT: Unremarkable Lungs: Clear to auscultation Rogers or wheezes Heart: Regular rate and rhythm no murmurs or gallops Breasts: Not examined Abdomen: Pendulous Pfannenstiel scar present nontender Pelvic: Bartholin urethra Skene was within normal limits Vagina: No gross lesions on inspection Cervix: No gross lesions on inspection Uterus 10 week size irregular adnexa difficult to delineate due to patient's obesity Rectal: Not done  No results found for this or any previous visit (from the past 24 hour(s)).  No results found.  Assessment/Plan: 41-year-old female with symptomatic leiomyomatous uteri. Back in 2007 she   had an abdominal myomectomy and chromopertubation. Tubal patency was not established before or after the myomectomy and the uterine cavity had not been entered. Gross appearance fallopian tubes appeared to be normal at that time. Patient now with recurrence of her fibroids measuring 65 x 64 x 56 mm and the second 16 x 23 mm as well as a persistent left ovarian cyst measuring 36 x 27 x 38 mm with  normal CA 125. Patient scheduled to undergo abdominal myomectomy left ovarian cystectomy possible left salpingo-oophorectomy along with chromopertubation. Patient has donated one unit of autologous blood and is in the process of donating her second unit. The risks benefits and pros and cons of the operation were discussed in detail to include the following: The risk of deep venous thrombosis and subsequent pulmonary embolism were discussed and for this reason she will have PSA stockings. Also the risk of infection although she will receive prophylaxis antibiotic. Also there is a risk of injury to internal organs requiring corrective surgery at that time. And in the event of uncontrollable hemorrhage and she would need additional blood more than 2 units and she has donated there is risk of anaphylactic reaction hepatitis and AIDS as a result of donor blood. As a life-saving measure she would lose all her reproductive organs she would never be of actual in the future as well. We'll also do chromopertubation again to see if tubal patency is present. If not 6 months down the line we'll proceed with an outpatient HSG. If tubes are still blocked have discussed with her today that in the near future which she plans on getting pregnant and she would be a candidate then for in vitro fertilization. All these issues were discussed in detail with the patient all questions rancher will follow accordingly.  Jannah Guardiola H 01/24/2011, 4:38 PM   

## 2011-02-17 NOTE — Anesthesia Postprocedure Evaluation (Signed)
Anesthesia Post Note  Patient: Mindy Collins  Procedure(s) Performed:  MYOMECTOMY - I am hoping to get a 7:30am time on Dec 4, Dec 11 or Dec 12. It not available I will check on some 1:00 pm times. Thanks; OVARIAN CYSTECTOMY  Anesthesia type: General  Patient location: PACU  Post pain: Pain level controlled  Post assessment: Post-op Vital signs reviewed  Last Vitals:  Filed Vitals:   02/17/11 1530  BP: 129/59  Pulse: 76  Temp: 37 C  Resp: 16    Post vital signs: Reviewed  Level of consciousness: sedated  Complications: No apparent anesthesia complicationsfj

## 2011-02-17 NOTE — Anesthesia Preprocedure Evaluation (Signed)
Anesthesia Evaluation  Patient identified by MRN, date of birth, ID band Patient awake    Reviewed: Allergy & Precautions, H&P , NPO status , Patient's Chart, lab work & pertinent test results  Airway Mallampati: I TM Distance: >3 FB Neck ROM: full    Dental No notable dental hx. (+) Chipped,    Pulmonary neg pulmonary ROS,  clear to auscultation  Pulmonary exam normal       Cardiovascular hypertension, Pt. on medications regular Normal    Neuro/Psych Negative Neurological ROS  Negative Psych ROS   GI/Hepatic Neg liver ROS, GERD-  Controlled and Medicated,Had some Nausea this am and some diarrhea   Endo/Other  Morbid obesity  Renal/GU negative Renal ROS  Genitourinary negative   Musculoskeletal negative musculoskeletal ROS (+)   Abdominal Normal abdominal exam  (+) obese,  Abdomen: soft.    Peds  Hematology negative hematology ROS (+)   Anesthesia Other Findings   Reproductive/Obstetrics negative OB ROS (+) Pregnancy                           Anesthesia Physical Anesthesia Plan  ASA: III  Anesthesia Plan: General ETT   Post-op Pain Management:    Induction: Cricoid pressure planned and Intravenous  Airway Management Planned: Oral ETT  Additional Equipment:   Intra-op Plan:   Post-operative Plan: Extubation in OR  Informed Consent: I have reviewed the patients History and Physical, chart, labs and discussed the procedure including the risks, benefits and alternatives for the proposed anesthesia with the patient or authorized representative who has indicated his/her understanding and acceptance.   Dental Advisory Given  Plan Discussed with: Anesthesiologist, CRNA and Surgeon  Anesthesia Plan Comments:         Anesthesia Quick Evaluation

## 2011-02-17 NOTE — Anesthesia Procedure Notes (Signed)
Procedure Name: Intubation Date/Time: 02/17/2011 1:36 PM Performed by: Karleen Dolphin Pre-anesthesia Checklist: Patient identified, Patient being monitored, Emergency Drugs available, Timeout performed and Suction available Patient Re-evaluated:Patient Re-evaluated prior to inductionOxygen Delivery Method: Circle System Utilized Preoxygenation: Pre-oxygenation with 100% oxygen Intubation Type: IV induction Ventilation: Oral airway inserted - appropriate to patient size and Mask ventilation without difficulty Laryngoscope Size: Mac and 3 Grade View: Grade I Tube type: Oral Tube size: 7.0 mm Number of attempts: 1 Airway Equipment and Method: stylet Placement Confirmation: ETT inserted through vocal cords under direct vision,  positive ETCO2 and breath sounds checked- equal and bilateral Secured at: 22 cm Tube secured with: Tape Dental Injury: Teeth and Oropharynx as per pre-operative assessment

## 2011-02-17 NOTE — Progress Notes (Unsigned)
Dr. Glenetta Hew asked me to call [facility] and add Chromopertubation to his case at 1:00pm today.  Also, he needed Petrissin 4 units per 20cc's lactate ringers and Methylene Blue Dye in the room.  Camille in the OR was notified of this. Then I spoke with Janene Harvey, RN in short stay to add Chromopertubation to operative permit.  I will route this to Dr. Glenetta Hew to make sure all satisfactory since there are no written orders from him.

## 2011-02-17 NOTE — Interval H&P Note (Signed)
History and Physical Interval Note:  02/17/2011 12:59 PM  Mindy Collins  has presented today for surgery, with the diagnosis of fibroids, ovarian cyst  The various methods of treatment have been discussed with the patient and family. After consideration of risks, benefits and other options for treatment, the patient has consented to  Procedure(s): MYOMECTOMY OVARIAN CYSTECTOMY as a surgical intervention .  The patients' history has been reviewed, patient examined, no change in status, stable for surgery.  I have reviewed the patients' chart and labs.  Questions were answered to the patient's satisfaction.     Ok Edwards

## 2011-02-18 ENCOUNTER — Encounter (HOSPITAL_COMMUNITY): Payer: Self-pay | Admitting: Gynecology

## 2011-02-18 LAB — CBC
HCT: 31.9 % — ABNORMAL LOW (ref 36.0–46.0)
Hemoglobin: 10.1 g/dL — ABNORMAL LOW (ref 12.0–15.0)
MCH: 26.6 pg (ref 26.0–34.0)
MCHC: 31.7 g/dL (ref 30.0–36.0)
MCV: 84.2 fL (ref 78.0–100.0)
Platelets: 378 10*3/uL (ref 150–400)
RBC: 3.79 MIL/uL — ABNORMAL LOW (ref 3.87–5.11)
RDW: 15 % (ref 11.5–15.5)
WBC: 18.9 10*3/uL — ABNORMAL HIGH (ref 4.0–10.5)

## 2011-02-18 MED ORDER — OXYCODONE-ACETAMINOPHEN 5-325 MG PO TABS
1.0000 | ORAL_TABLET | ORAL | Status: DC | PRN
  Administered 2011-02-18 – 2011-02-19 (×5): 1 via ORAL
  Filled 2011-02-18 (×5): qty 1

## 2011-02-18 NOTE — Progress Notes (Signed)
1 Day Post-Op Procedure(s): MYOMECTOMY/left  OVARIAN CYSTECTOMY  Subjective: Patient reports incisional pain.    Objective: I have reviewed patient's vital signs, intake and output, medications and labs. Hemoglobin 10.1. Adequate urine output past 8 hours.  General: alert, cooperative and no distress Resp: clear to auscultation bilaterally Cardio: regular rate and rhythm, S1, S2 normal, no murmur, click, rub or gallop GI: soft, non-tender; bowel sounds normal; no masses,  no organomegaly Extremities: extremities normal, atraumatic, no cyanosis or edema  Assessment: s/p Procedure(s): MYOMECTOMY left OVARIAN CYSTECTOMY: stable, progressing well and hungry  Plan: Advance diet, DC Foley, DC PCA pump, ambulate, will hold off on hypertension medication for now.  LOS: 1 day    [name] 02/18/2011, 6:36 AM

## 2011-02-18 NOTE — Progress Notes (Signed)
Evening rounds. Patient has been ambulating and tolerating full liquid diet today. No flatus reported. Patient with history of hypertension we have held off her antihypertensive medication because of her blood pressure readings.  Abdomen soft nontender hypoactive bowel sounds.  Patient discharged this evening ambulation encouraged we'll continue to monitor her vital signs in advance her diet to regular diet when she's passing flatus.

## 2011-02-18 NOTE — Progress Notes (Signed)
UR Chart review completed.  

## 2011-02-19 MED ORDER — BISACODYL 10 MG RE SUPP
10.0000 mg | Freq: Once | RECTAL | Status: AC
  Administered 2011-02-19: 10 mg via RECTAL
  Filled 2011-02-19: qty 1

## 2011-02-19 MED ORDER — PROMETHAZINE HCL 12.5 MG PO TABS: 12.5000 mg | ORAL_TABLET | Freq: Four times a day (QID) | ORAL | Status: AC | PRN

## 2011-02-19 MED ORDER — OXYCODONE-ACETAMINOPHEN 5-325 MG PO TABS: 1.0000 | ORAL_TABLET | ORAL | Status: AC | PRN

## 2011-02-19 NOTE — Progress Notes (Signed)
Pt discharged to home with mother.  Condition stable.  Pt ambulated to car with Stevphen Meuse, NT.  No equipment ordered for home at discharge.

## 2011-02-19 NOTE — Progress Notes (Signed)
2 Days Post-Op Procedure(s): MYOMECTOMY/ left OVARIAN CYSTECTOMY  Subjective: Patient reports tolerating PO and no problems voiding.    Objective: I have reviewed patient's vital signs, intake and output, medications and labs.  General: alert, cooperative and no distress Resp: clear to auscultation bilaterally Cardio: regular rate and rhythm, S1, S2 normal, no murmur, click, rub or gallop GI: soft, non-tender; bowel sounds normal; no masses,  no organomegaly Extremities: extremities normal, atraumatic, no cyanosis or edema Vaginal Bleeding: none  Incision intact  Assessment: s/p Procedure(s): MYOMECTOMY/left OVARIAN CYSTECTOMY: stable, progressing well and tolerating diet  Plan: Advance diet Discontinue IV fluids Discharge home  LOS: 2 days    FERNANDEZ,JUAN H 02/19/2011, 7:45 AM

## 2011-02-19 NOTE — Plan of Care (Signed)
Problem: Phase II Progression Outcomes Goal: Remove staples if indicated/incision care Outcome: Not Applicable Date Met:  02/19/11 To be done in office  Problem: Discharge Progression Outcomes Goal: Discontinue staples (if applicable) Outcome: Not Applicable Date Met:  02/19/11 To be done in office

## 2011-02-19 NOTE — Discharge Summary (Signed)
Physician Discharge Summary  Patient ID: Mindy Collins MRN: [id number] DOB/AGE: April 12, 1970 41 y.o.  Admit date: 02/17/2011 Discharge date: 02/19/2011  Admission Diagnoses: Pelvic pain, dysmenorrhea, menorrhagia, fibroid uterus, left ovarian cyst  Discharge Diagnoses: Leiomyomatous uteri, anemia, dysmenorrhea, menorrhagia, left ovarian cyst Active Problems:  * No active hospital problems. *    Discharged Condition: good  Hospital Course: Patient was admitted to the hospital 2 days ago where she underwent abdominal myomectomy along with left ovarian cystectomy and lysis of pelvic adhesions. Patient had one large 5 x 6 cm fundal fibroid. She had a solid left ovarian cyst measuring partly 3 cm in size. The uterus left sidewall was adhered to the pelvic sidewall. Anterior and posterior cul-de-sac were clear of any endometriosis or adhesions. The right fallopian tube distal portion was found to be adhered to the fundal aspect of the uterus which required a tedious dissection to free it. Chromopertubation did not demonstrate tubal patency. All the clinical difficulty with the catheter they have come loose so an outpatient HSG will need to be done at a later date. Patient with known history of hypertension prior to her surgery was not restart her hypertensive medication and we'll wait a few days before restarting. Patient had donated 2 units of blood in the event she would need blood during her myomectomy and it was not needed. Patient's preoperative hemoglobin was 11.5 postop was 10.1. First 24 hours patient had a Foley catheter with good urinary output was discontinued the next day as was her PCA pump she was started on clear liquid diet and this morning before discharge of the advance to regular diet. She had good bowel sounds did not pass flatus yet. Dulcolax suppository will be given this morning before discharge. Her incision was intact. Return back to the office next week to have staples removed. At which  time she'll be instructed to restart her antihypertensive medication and her iron supplementation. Consults: none  Significant Diagnostic Studies: labs: Hemoglobin 10.1 at time of discharge.  Treatments: surgery: Abdominal myomectomy, lysis of pelvic adhesions, left ovarian cystectomy.  Discharge Exam: Blood pressure 119/79, pulse 108, temperature 98.8 F (37.1 C), temperature source Oral, resp. rate 18, height 5' 1.5" (1.562 m), weight 254 lb (115.214 kg), last menstrual period 02/15/2011, SpO2 100.00%. General appearance: alert and cooperative Cardio: regular rate and rhythm, S1, S2 normal, no murmur, click, rub or gallop GI: soft, non-tender; bowel sounds normal; no masses,  no organomegaly Extremities: extremities normal, atraumatic, no cyanosis or edema Incision/Wound: incision intact  Disposition: Home or Self Care  Discharge Orders    Future Appointments: Provider: Department: Dept Phone: Center:   02/24/2011 10:20 AM Ok Edwards, MD [facility] [phone] [facility]     Future Orders Please Complete By Expires   Resume previous diet      Driving Restrictions      Comments:   No driving for 1 weeks   Lifting restrictions      Comments:   No lifting for 6 weeks   Call MD for:  temperature >100.5      Call MD for:  redness, tenderness, or signs of infection (pain, swelling, bleeding, redness, odor or green/yellow discharge around incision site)      Call MD for:  severe or increased pain, loss or decreased feeling  in affected limb(s)      Discharge instructions      Comments:   Follow up with Dr. Lily Peer 10:20 am Monday 02/24/2011 Do not start iron  tablets until after office visit as well as blood pressure medication   May shower       Discontinue IV fluid when diet tolerated        Medication List  As of 02/19/2011  8:07 AM   START taking these medications         oxyCODONE-acetaminophen 5-325 MG per tablet   Commonly known as: PERCOCET   Take 1-2  tablets by mouth every 4 (four) hours as needed.      promethazine 12.5 MG tablet   Commonly known as: PHENERGAN   Take 1 tablet (12.5 mg total) by mouth every 6 (six) hours as needed for nausea.         STOP taking these medications         Ferrous Sulfate 140 (45 FE) MG Tbcr      fluticasone 50 MCG/ACT nasal spray      multivitamin tablet      mupirocin ointment 2 %      olmesartan-hydrochlorothiazide 20-12.5 MG per tablet      RABEprazole 20 MG tablet          Where to get your medications    These are the prescriptions that you need to pick up. We sent them to a specific pharmacy, so you will need to go there to get them.   [facility] [address], [place] - [address]    [address] [place] [address]    Phone: [phone]        promethazine 12.5 MG tablet         You may get these medications from any pharmacy.         oxyCODONE-acetaminophen 5-325 MG per tablet             Signed: Lorain Keast H 02/19/2011, 8:07 AM

## 2011-02-20 ENCOUNTER — Encounter: Payer: Self-pay | Admitting: Gynecology

## 2011-02-20 ENCOUNTER — Other Ambulatory Visit: Payer: Self-pay | Admitting: *Deleted

## 2011-02-20 ENCOUNTER — Ambulatory Visit (INDEPENDENT_AMBULATORY_CARE_PROVIDER_SITE_OTHER): Payer: PRIVATE HEALTH INSURANCE | Admitting: Gynecology

## 2011-02-20 ENCOUNTER — Ambulatory Visit (HOSPITAL_COMMUNITY)
Admission: RE | Admit: 2011-02-20 | Discharge: 2011-02-20 | Disposition: A | Discharge status: Home / Self Care | Payer: PRIVATE HEALTH INSURANCE | Source: Ambulatory Visit | Attending: Gynecology | Admitting: Gynecology

## 2011-02-20 VITALS — BP 124/86

## 2011-02-20 DIAGNOSIS — M79609 Pain in unspecified limb: Secondary | ICD-10-CM

## 2011-02-20 DIAGNOSIS — R252 Cramp and spasm: Secondary | ICD-10-CM

## 2011-02-20 DIAGNOSIS — R209 Unspecified disturbances of skin sensation: Secondary | ICD-10-CM | POA: Insufficient documentation

## 2011-02-20 NOTE — Progress Notes (Signed)
*  PRELIMINARY RESULTS*  Left Lower Extremity Venous Duplex has been performed. Left:  No evidence of DVT, superficial thrombosis, or Baker's cyst.   Farrel Demark RDMS 02/20/2011, 3:02 PM

## 2011-02-20 NOTE — Progress Notes (Signed)
Patient presented to the office today stating this and she left the hospital yesterday she was having some tingling sensation numbness on her anterior thigh and lower extremity. Patient denies any difficulty with walking. She states she feels a little bit on her right breast mostly on her left side. Patient is overweight. Patient status post abdominal myomectomy and left ovarian cystectomy 3 days ago. Patient otherwise getting around well no shortness of breath reported.  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  Exam: Abdomen incision intact staples still present scheduled to be removed next week. Soft nontender no rebound or guarding  Extremities: Both lower strategies symmetrical in appearance similar circumference dorsalis pedis and popliteal pulses were equal. No pitting edema. No change in temperature on either leg. Negative Homans sign on right leg questionable on left. No motor or sensory deficit.  Since patient is morbidly obese and is postop symptoms are possible DVT vague although will send for Doppler flow study today for reassurance. She will otherwise return next week to have her staples removed and she would be encouraged also to continue her ambulation.  Verbal report from [facility] negative Doppler flow study of the left extremity for DVT.

## 2011-02-24 ENCOUNTER — Ambulatory Visit: Payer: PRIVATE HEALTH INSURANCE | Admitting: Gynecology

## 2011-02-25 ENCOUNTER — Ambulatory Visit (INDEPENDENT_AMBULATORY_CARE_PROVIDER_SITE_OTHER): Payer: PRIVATE HEALTH INSURANCE | Admitting: Gynecology

## 2011-02-25 ENCOUNTER — Encounter: Payer: Self-pay | Admitting: Gynecology

## 2011-02-25 VITALS — BP 128/90

## 2011-02-25 DIAGNOSIS — Z9889 Other specified postprocedural states: Secondary | ICD-10-CM

## 2011-02-25 DIAGNOSIS — N83209 Unspecified ovarian cyst, unspecified side: Secondary | ICD-10-CM | POA: Insufficient documentation

## 2011-02-25 NOTE — Patient Instructions (Addendum)
Your pathology report was benign as indicated below from the official report. Resume your iron tablet yet if you have not started it yet.Start your blood pressure medication today.  REPORT OF SURGICAL PATHOLOGY FINAL DIAGNOSIS Diagnosis 1. Cyst, biopsy - CONSISTENT WITH BENIGN SEROUS CYST. 2. Ovary, left - BRENNER TUMOR. - MALIGNANT FEATURES ARE NOT IDENTIFIED. 3. Uterine fibroid(s) - LEIOMYOMA. - MALIGNANT FEATURES ARE NOT IDENTIFIED                                                    Cholesterol Control Diet  Cholesterol levels in your body are determined significantly by your diet. Cholesterol levels may also be related to heart disease. The following material helps to explain this relationship and discusses what you can do to help keep your heart healthy. Not all cholesterol is bad. Low-density lipoprotein (LDL) cholesterol is the "bad" cholesterol. It may cause fatty deposits to build up inside your arteries. High-density lipoprotein (HDL) cholesterol is "good." It helps to remove the "bad" LDL cholesterol from your blood. Cholesterol is a very important risk factor for heart disease. Other risk factors are high blood pressure, smoking, stress, heredity, and weight. The heart muscle gets its supply of blood through the coronary arteries. If your LDL cholesterol is high and your HDL cholesterol is low, you are at risk for having fatty deposits build up in your coronary arteries. This leaves less room through which blood can flow. Without sufficient blood and oxygen, the heart muscle cannot function properly and you may feel chest pains (angina pectoris). When a coronary artery closes up entirely, a part of the heart muscle may die, causing a heart attack (myocardial infarction). CHECKING CHOLESTEROL When your caregiver sends your blood to a lab to be analyzed for cholesterol, a complete lipid (fat) profile may be done. With this test, the total amount of cholesterol and levels of LDL and HDL  are determined. Triglycerides are a type of fat that circulates in the blood and can also be used to determine heart disease risk. The list below describes what the numbers should be: Test: Total Cholesterol.  Less than 200 mg/dl.  Test: LDL "bad cholesterol."  Less than 100 mg/dl.   Less than 70 mg/dl if you are at very high risk of a heart attack or sudden cardiac death.  Test: HDL "good cholesterol."  Greater than 50 mg/dl for women.   Greater than 40 mg/dl for men.  Test: Triglycerides.  Less than 150 mg/dl.  CONTROLLING CHOLESTEROL WITH DIET Although exercise and lifestyle factors are important, your diet is key. That is because certain foods are known to raise cholesterol and others to lower it. The goal is to balance foods for their effect on cholesterol and more importantly, to replace saturated and trans fat with other types of fat, such as monounsaturated fat, polyunsaturated fat, and omega-3 fatty acids. On average, a person should consume no more than 15 to 17 g of saturated fat daily. Saturated and trans fats are considered "bad" fats, and they will raise LDL cholesterol. Saturated fats are primarily found in animal products such as meats, butter, and cream. However, that does not mean you need to sacrifice all your favorite foods. Today, there are good tasting, low-fat, low-cholesterol substitutes for most of the things you like to eat. Choose low-fat or nonfat alternatives. Choose round  or loin cuts of red meat, since these types of cuts are lowest in fat and cholesterol. Chicken (without the skin), fish, veal, and ground [place] breast are excellent choices. Eliminate fatty meats, such as hot dogs and salami. Even shellfish have little or no saturated fat. Have a 3 oz (85 g) portion when you eat lean meat, poultry, or fish. Trans fats are also called "partially hydrogenated oils." They are oils that have been scientifically manipulated so that they are solid at room temperature  resulting in a longer shelf life and improved taste and texture of foods in which they are added. Trans fats are found in stick margarine, some tub margarines, cookies, crackers, and baked goods.  When baking and cooking, oils are an excellent substitute for butter. The monounsaturated oils are especially beneficial since it is believed they lower LDL and raise HDL. The oils you should avoid entirely are saturated tropical oils, such as coconut and palm.  Remember to eat liberally from food groups that are naturally free of saturated and trans fat, including fish, fruit, vegetables, beans, grains (barley, rice, couscous, bulgur wheat), and pasta (without cream sauces).  IDENTIFYING FOODS THAT LOWER CHOLESTEROL  Soluble fiber may lower your cholesterol. This type of fiber is found in fruits such as apples, vegetables such as broccoli, potatoes, and carrots, legumes such as beans, peas, and lentils, and grains such as barley. Foods fortified with plant sterols (phytosterol) may also lower cholesterol. You should eat at least 2 g per day of these foods for a cholesterol lowering effect.  Read package labels to identify low-saturated fats, trans fats free, and low-fat foods at the supermarket. Select cheeses that have only 2 to 3 g saturated fat per ounce. Use a heart-healthy tub margarine that is free of trans fats or partially hydrogenated oil. When buying baked goods (cookies, crackers), avoid partially hydrogenated oils. Breads and muffins should be made from whole grains (whole-wheat or whole oat flour, instead of "flour" or "enriched flour"). Buy non-creamy canned soups with reduced salt and no added fats.  FOOD PREPARATION TECHNIQUES  Never deep-fry. If you must fry, either stir-fry, which uses very little fat, or use non-stick cooking sprays. When possible, broil, bake, or roast meats, and steam vegetables. Instead of dressing vegetables with butter or margarine, use lemon and herbs, applesauce and  cinnamon (for squash and sweet potatoes), nonfat yogurt, salsa, and low-fat dressings for salads.  LOW-SATURATED FAT / LOW-FAT FOOD SUBSTITUTES Meats / Saturated Fat (g)  Avoid: Steak, marbled (3 oz/85 g) / 11 g   Choose: Steak, lean (3 oz/85 g) / 4 g   Avoid: Hamburger (3 oz/85 g) / 7 g   Choose: Hamburger, lean (3 oz/85 g) / 5 g   Avoid: Ham (3 oz/85 g) / 6 g   Choose: Ham, lean cut (3 oz/85 g) / 2.4 g   Avoid: Chicken, with skin, dark meat (3 oz/85 g) / 4 g   Choose: Chicken, skin removed, dark meat (3 oz/85 g) / 2 g   Avoid: Chicken, with skin, light meat (3 oz/85 g) / 2.5 g   Choose: Chicken, skin removed, light meat (3 oz/85 g) / 1 g  Dairy / Saturated Fat (g)  Avoid: Whole milk (1 cup) / 5 g   Choose: Low-fat milk, 2% (1 cup) / 3 g   Choose: Low-fat milk, 1% (1 cup) / 1.5 g   Choose: Skim milk (1 cup) / 0.3 g   Avoid: Hard cheese (1 oz/28  g) / 6 g   Choose: Skim milk cheese (1 oz/28 g) / 2 to 3 g   Avoid: Cottage cheese, 4% fat (1 cup) / 6.5 g   Choose: Low-fat cottage cheese, 1% fat (1 cup) / 1.5 g   Avoid: Ice cream (1 cup) / 9 g   Choose: Sherbet (1 cup) / 2.5 g   Choose: Nonfat frozen yogurt (1 cup) / 0.3 g   Choose: Frozen fruit bar / trace   Avoid: Whipped cream (1 tbs) / 3.5 g   Choose: Nondairy whipped topping (1 tbs) / 1 g  Condiments / Saturated Fat (g)  Avoid: Mayonnaise (1 tbs) / 2 g   Choose: Low-fat mayonnaise (1 tbs) / 1 g   Avoid: Butter (1 tbs) / 7 g   Choose: Extra light margarine (1 tbs) / 1 g   Avoid: Coconut oil (1 tbs) / 11.8 g   Choose: Olive oil (1 tbs) / 1.8 g   Choose: Corn oil (1 tbs) / 1.7 g   Choose: Safflower oil (1 tbs) / 1.2 g   Choose: Sunflower oil (1 tbs) / 1.4 g   Choose: Soybean oil (1 tbs) / 2.4 g   Choose: Canola oil (1 tbs) / 1 g  Document Released: 01/27/2005 Document Revised: 10/09/2010 Document Reviewed: 07/18/2010 [facility] Patient Information 2012 [place], [place].  Exercise to Lose  Weight Exercise and a healthy diet may help you lose weight. Your doctor may suggest specific exercises. EXERCISE IDEAS AND TIPS  Choose low-cost things you enjoy doing, such as walking, bicycling, or exercising to workout videos.   Take stairs instead of the elevator.   Walk during your lunch break.   Park your car further away from work or school.   Go to a gym or an exercise class.   Start with 5 to 10 minutes of exercise each day. Build up to 30 minutes of exercise 4 to 6 days a week.   Wear shoes with good support and comfortable clothes.   Stretch before and after working out.   Work out until you breathe harder and your heart beats faster.   Drink extra water when you exercise.   Do not do so much that you hurt yourself, feel dizzy, or get very short of breath.  Exercises that burn about 150 calories:  Running 1  miles in 15 minutes.   Playing volleyball for 45 to 60 minutes.   Washing and waxing a car for 45 to 60 minutes.   Playing touch football for 45 minutes.   Walking 1  miles in 35 minutes.   Pushing a stroller 1  miles in 30 minutes.   Playing basketball for 30 minutes.   Raking leaves for 30 minutes.   Bicycling 5 miles in 30 minutes.   Walking 2 miles in 30 minutes.   Dancing for 30 minutes.   Shoveling snow for 15 minutes.   Swimming laps for 20 minutes.   Walking up stairs for 15 minutes.   Bicycling 4 miles in 15 minutes.   Gardening for 30 to 45 minutes.   Jumping rope for 15 minutes.   Washing windows or floors for 45 to 60 minutes.  Document Released: 03/01/2010 Document Revised: 10/09/2010 Document Reviewed: 03/01/2010 [facility] Patient Information 2012 [place], [place].

## 2011-02-25 NOTE — Progress Notes (Signed)
Patient is a 41 year old who presented to the office today for first postop visit to remove her staples. She is status post abdominal myomectomy with left ovarian cystectomy. She was seen in the office. On January 10 the day after her surgery stating she was having tingling sensation numbness on her anterior thigh and lower extremity she denied any difficulty walking. She felt some on her right leg but was mostly on her left leg. She had negative Homans sign and no abnormal clinical findings but since she is overweight and was postop she was sent for Doppler flow study of the left lower extremities which was normal with the following results:  Left Lower Extremity Venous Duplex has been performed. Left: No evidence of DVT, superficial thrombosis, or Baker's cyst  Examination: Pfannenstiel incision intact staples were removed and Steri-Strips were placed. Patient postop hemoglobin 10.1 and will be instructed to restart her R. supplementation now that she's having normal bowel movements. She also had history of hypertension but postop she was normotensive so she was asked to hold off from medication. Her blood pressure today was 128/90 and she was instructed to start taking her blood pressure medication.  Final pathology report benign as follows:  REPORT OF SURGICAL PATHOLOGY FINAL DIAGNOSIS Diagnosis 1. Cyst, biopsy - CONSISTENT WITH BENIGN SEROUS CYST. 2. Ovary, left - BRENNER TUMOR. - MALIGNANT FEATURES ARE NOT IDENTIFIED. 3. Uterine fibroid(s) - LEIOMYOMA. - MALIGNANT FEATURES ARE NOT IDENTIFIED. Pecola Leisure MD Pathologist, Electronic Signature (Case signed 02/19/2011)  Findings discussed with the patient all questions are answered and we'll see her back in 4 weeks for final postop visit.   She is no longer having any discomfort in the left lower extremity or her right and she's ambulating without any difficulties or complaints she is recovering well from her surgery.

## 2011-03-06 ENCOUNTER — Ambulatory Visit (INDEPENDENT_AMBULATORY_CARE_PROVIDER_SITE_OTHER): Payer: PRIVATE HEALTH INSURANCE | Admitting: Gynecology

## 2011-03-06 ENCOUNTER — Encounter: Payer: Self-pay | Admitting: Gynecology

## 2011-03-06 VITALS — BP 134/86

## 2011-03-06 DIAGNOSIS — Z5181 Encounter for therapeutic drug level monitoring: Secondary | ICD-10-CM

## 2011-03-06 NOTE — Progress Notes (Signed)
Patient presented to the office today for discussion of current medication that sports medicine physician had placed her on a [employer] [facility] as a result of her sciatica and the left knee from a prior injury. They had prescribed her Mobic 15 mg daily and prednisone 20 mg to be tapered over a two-week period. Patient status post abdominal myomectomy and left ovarian cystectomy in January of this year and was doing well otherwise. She was stating that they were going to start her on physical therapy after her two-week steroid treatment. I gave her a note to present to her physician for her not to do any strenuous activity or lifting until to 6 weeks postop. Marland Kitchen Her Pfannenstiel incision was intact. She scheduled to return to the office in February for 4 weeks postop appointment.

## 2011-03-25 ENCOUNTER — Ambulatory Visit (INDEPENDENT_AMBULATORY_CARE_PROVIDER_SITE_OTHER): Payer: PRIVATE HEALTH INSURANCE | Admitting: Gynecology

## 2011-03-25 ENCOUNTER — Encounter: Payer: Self-pay | Admitting: Gynecology

## 2011-03-25 VITALS — BP 138/90

## 2011-03-25 DIAGNOSIS — Z9889 Other specified postprocedural states: Secondary | ICD-10-CM

## 2011-03-25 DIAGNOSIS — D649 Anemia, unspecified: Secondary | ICD-10-CM

## 2011-03-25 DIAGNOSIS — N83209 Unspecified ovarian cyst, unspecified side: Secondary | ICD-10-CM

## 2011-03-25 LAB — CBC WITH DIFFERENTIAL/PLATELET
Basophils Absolute: 0 10*3/uL (ref 0.0–0.1)
Basophils Relative: 0 % (ref 0–1)
Eosinophils Absolute: 0.1 10*3/uL (ref 0.0–0.7)
Eosinophils Relative: 1 % (ref 0–5)
HCT: 37.5 % (ref 36.0–46.0)
Hemoglobin: 11.4 g/dL — ABNORMAL LOW (ref 12.0–15.0)
Lymphocytes Relative: 16 % (ref 12–46)
Lymphs Abs: 1.7 10*3/uL (ref 0.7–4.0)
MCH: 25.7 pg — ABNORMAL LOW (ref 26.0–34.0)
MCHC: 30.4 g/dL (ref 30.0–36.0)
MCV: 84.7 fL (ref 78.0–100.0)
Monocytes Absolute: 0.6 10*3/uL (ref 0.1–1.0)
Monocytes Relative: 6 % (ref 3–12)
Neutro Abs: 8 10*3/uL — ABNORMAL HIGH (ref 1.7–7.7)
Neutrophils Relative %: 76 % (ref 43–77)
Platelets: 409 10*3/uL — ABNORMAL HIGH (ref 150–400)
RBC: 4.43 MIL/uL (ref 3.87–5.11)
RDW: 14.9 % (ref 11.5–15.5)
WBC: 10.5 10*3/uL (ref 4.0–10.5)

## 2011-03-25 NOTE — Patient Instructions (Signed)
You may resume full normal activity without restrictions in 2 weeks.

## 2011-03-25 NOTE — Progress Notes (Signed)
Patient presented to the office today for final postop visit she is status post abdominal myomectomy and left ovarian cystectomy. 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 Patient is doing well with no complaints.  Abdomen: Pfannenstiel scar completely healed. Abdomen soft nontender no rebound or guarding Pelvic: Bartholin urethra Skene was within normal limits Vagina: No gross lesions on inspection Cervix: No gross lesions on inspection Uterus: Anteverted no palpable masses or tenderness Adnexa: No palpable masses or tenderness Rectal: Not examined  Patient status post abdominal myomectomy and excision of left ovarian cyst 4 weeks out from surgery doing well. She may resume full normal activity in 2 weeks. We'll see her back in one year or when necessary. We'll check her CBC today if her hemoglobin was back to 12 she will no longer need to continue her iron supplementation.

## 2011-03-28 ENCOUNTER — Telehealth: Payer: Self-pay | Admitting: *Deleted

## 2011-03-28 NOTE — Telephone Encounter (Signed)
Pt called wanting to know her hemoglobin result from 2/12. Pt will continue to take her iron tablet as directed on office visit because of low hemoglobin 11.4

## 2011-06-12 ENCOUNTER — Ambulatory Visit: Payer: PRIVATE HEALTH INSURANCE | Attending: Sports Medicine | Admitting: Physical Therapy

## 2011-06-12 ENCOUNTER — Ambulatory Visit: Payer: PRIVATE HEALTH INSURANCE | Admitting: Rehabilitative and Restorative Service Providers"

## 2011-06-12 DIAGNOSIS — R293 Abnormal posture: Secondary | ICD-10-CM | POA: Insufficient documentation

## 2011-06-12 DIAGNOSIS — M25569 Pain in unspecified knee: Secondary | ICD-10-CM | POA: Insufficient documentation

## 2011-06-12 DIAGNOSIS — M6281 Muscle weakness (generalized): Secondary | ICD-10-CM | POA: Insufficient documentation

## 2011-06-12 DIAGNOSIS — IMO0001 Reserved for inherently not codable concepts without codable children: Secondary | ICD-10-CM | POA: Insufficient documentation

## 2011-06-17 ENCOUNTER — Ambulatory Visit: Payer: PRIVATE HEALTH INSURANCE | Admitting: Physical Therapy

## 2011-06-19 ENCOUNTER — Ambulatory Visit: Payer: PRIVATE HEALTH INSURANCE | Admitting: Physical Therapy

## 2011-06-19 ENCOUNTER — Encounter: Payer: PRIVATE HEALTH INSURANCE | Admitting: Physical Therapy

## 2011-06-24 ENCOUNTER — Ambulatory Visit: Payer: PRIVATE HEALTH INSURANCE | Admitting: Physical Therapy

## 2011-06-26 ENCOUNTER — Ambulatory Visit: Payer: PRIVATE HEALTH INSURANCE | Admitting: Physical Therapy

## 2011-07-01 ENCOUNTER — Ambulatory Visit: Payer: PRIVATE HEALTH INSURANCE | Admitting: Physical Therapy

## 2011-07-03 ENCOUNTER — Ambulatory Visit: Payer: PRIVATE HEALTH INSURANCE | Admitting: Physical Therapy

## 2011-07-08 ENCOUNTER — Ambulatory Visit: Payer: PRIVATE HEALTH INSURANCE | Admitting: Physical Therapy

## 2011-07-10 ENCOUNTER — Ambulatory Visit: Payer: PRIVATE HEALTH INSURANCE | Admitting: Physical Therapy

## 2011-07-14 ENCOUNTER — Ambulatory Visit: Payer: PRIVATE HEALTH INSURANCE | Attending: Sports Medicine | Admitting: Physical Therapy

## 2011-07-14 DIAGNOSIS — M25569 Pain in unspecified knee: Secondary | ICD-10-CM | POA: Insufficient documentation

## 2011-07-14 DIAGNOSIS — IMO0001 Reserved for inherently not codable concepts without codable children: Secondary | ICD-10-CM | POA: Insufficient documentation

## 2011-07-14 DIAGNOSIS — R293 Abnormal posture: Secondary | ICD-10-CM | POA: Insufficient documentation

## 2011-07-14 DIAGNOSIS — M6281 Muscle weakness (generalized): Secondary | ICD-10-CM | POA: Insufficient documentation

## 2011-07-17 ENCOUNTER — Ambulatory Visit: Payer: PRIVATE HEALTH INSURANCE | Admitting: Physical Therapy

## 2011-07-21 ENCOUNTER — Encounter: Payer: PRIVATE HEALTH INSURANCE | Admitting: Physical Therapy

## 2011-07-28 ENCOUNTER — Ambulatory Visit: Payer: PRIVATE HEALTH INSURANCE | Admitting: Physical Therapy

## 2011-07-30 ENCOUNTER — Encounter: Payer: PRIVATE HEALTH INSURANCE | Admitting: Physical Therapy

## 2011-08-06 ENCOUNTER — Ambulatory Visit: Payer: PRIVATE HEALTH INSURANCE

## 2011-08-08 ENCOUNTER — Encounter: Payer: PRIVATE HEALTH INSURANCE | Admitting: Physical Therapy

## 2011-08-11 ENCOUNTER — Encounter: Payer: PRIVATE HEALTH INSURANCE | Admitting: Gynecology

## 2011-08-11 ENCOUNTER — Ambulatory Visit: Payer: PRIVATE HEALTH INSURANCE | Attending: Sports Medicine | Admitting: Physical Therapy

## 2011-08-11 DIAGNOSIS — R293 Abnormal posture: Secondary | ICD-10-CM | POA: Insufficient documentation

## 2011-08-11 DIAGNOSIS — M25569 Pain in unspecified knee: Secondary | ICD-10-CM | POA: Insufficient documentation

## 2011-08-11 DIAGNOSIS — M6281 Muscle weakness (generalized): Secondary | ICD-10-CM | POA: Insufficient documentation

## 2011-08-11 DIAGNOSIS — IMO0001 Reserved for inherently not codable concepts without codable children: Secondary | ICD-10-CM | POA: Insufficient documentation

## 2011-08-15 ENCOUNTER — Ambulatory Visit: Payer: PRIVATE HEALTH INSURANCE | Admitting: Physical Therapy

## 2011-08-19 ENCOUNTER — Ambulatory Visit: Payer: PRIVATE HEALTH INSURANCE

## 2011-08-21 ENCOUNTER — Ambulatory Visit: Payer: PRIVATE HEALTH INSURANCE

## 2011-08-21 ENCOUNTER — Ambulatory Visit (INDEPENDENT_AMBULATORY_CARE_PROVIDER_SITE_OTHER): Payer: PRIVATE HEALTH INSURANCE | Admitting: Gynecology

## 2011-08-21 ENCOUNTER — Encounter: Payer: Self-pay | Admitting: Gynecology

## 2011-08-21 ENCOUNTER — Other Ambulatory Visit (HOSPITAL_COMMUNITY)
Admission: RE | Admit: 2011-08-21 | Discharge: 2011-08-21 | Disposition: A | Discharge status: Home / Self Care | Payer: PRIVATE HEALTH INSURANCE | Source: Ambulatory Visit | Attending: Gynecology | Admitting: Gynecology

## 2011-08-21 VITALS — BP 132/90 | Ht 61.5 in | Wt 255.0 lb

## 2011-08-21 DIAGNOSIS — I10 Essential (primary) hypertension: Secondary | ICD-10-CM

## 2011-08-21 DIAGNOSIS — Z01419 Encounter for gynecological examination (general) (routine) without abnormal findings: Secondary | ICD-10-CM | POA: Insufficient documentation

## 2011-08-21 DIAGNOSIS — R635 Abnormal weight gain: Secondary | ICD-10-CM

## 2011-08-21 DIAGNOSIS — Z1159 Encounter for screening for other viral diseases: Secondary | ICD-10-CM | POA: Insufficient documentation

## 2011-08-21 DIAGNOSIS — Z113 Encounter for screening for infections with a predominantly sexual mode of transmission: Secondary | ICD-10-CM

## 2011-08-21 LAB — CBC WITH DIFFERENTIAL/PLATELET
Basophils Absolute: 0 10*3/uL (ref 0.0–0.1)
Basophils Relative: 0 % (ref 0–1)
Eosinophils Absolute: 0.1 10*3/uL (ref 0.0–0.7)
Eosinophils Relative: 1 % (ref 0–5)
HCT: 38.1 % (ref 36.0–46.0)
Hemoglobin: 12.7 g/dL (ref 12.0–15.0)
Lymphocytes Relative: 16 % (ref 12–46)
Lymphs Abs: 1.7 10*3/uL (ref 0.7–4.0)
MCH: 26.5 pg (ref 26.0–34.0)
MCHC: 33.3 g/dL (ref 30.0–36.0)
MCV: 79.5 fL (ref 78.0–100.0)
Monocytes Absolute: 0.6 10*3/uL (ref 0.1–1.0)
Monocytes Relative: 6 % (ref 3–12)
Neutro Abs: 8.5 10*3/uL — ABNORMAL HIGH (ref 1.7–7.7)
Neutrophils Relative %: 77 % (ref 43–77)
Platelets: 428 10*3/uL — ABNORMAL HIGH (ref 150–400)
RBC: 4.79 MIL/uL (ref 3.87–5.11)
RDW: 16.3 % — ABNORMAL HIGH (ref 11.5–15.5)
WBC: 10.9 10*3/uL — ABNORMAL HIGH (ref 4.0–10.5)

## 2011-08-21 LAB — COMPREHENSIVE METABOLIC PANEL
ALT: 23 U/L (ref 0–35)
AST: 18 U/L (ref 0–37)
Albumin: 3.9 g/dL (ref 3.5–5.2)
Alkaline Phosphatase: 96 U/L (ref 39–117)
BUN: 9 mg/dL (ref 6–23)
CO2: 29 mEq/L (ref 19–32)
Calcium: 9.3 mg/dL (ref 8.4–10.5)
Chloride: 104 mEq/L (ref 96–112)
Creat: 0.72 mg/dL (ref 0.50–1.10)
Glucose, Bld: 93 mg/dL (ref 70–99)
Potassium: 3.9 mEq/L (ref 3.5–5.3)
Sodium: 141 mEq/L (ref 135–145)
Total Bilirubin: 0.4 mg/dL (ref 0.3–1.2)
Total Protein: 6.9 g/dL (ref 6.0–8.3)

## 2011-08-21 LAB — LIPID PANEL
Cholesterol: 209 mg/dL — ABNORMAL HIGH (ref 0–200)
HDL: 42 mg/dL (ref >39)
LDL Cholesterol: 139 mg/dL — ABNORMAL HIGH (ref 0–99)
Total CHOL/HDL Ratio: 5 Ratio
Triglycerides: 140 mg/dL (ref <150)
VLDL: 28 mg/dL (ref 0–40)

## 2011-08-21 LAB — HEPATITIS C ANTIBODY: HCV Ab: NEGATIVE

## 2011-08-21 LAB — HEPATITIS B SURFACE ANTIGEN: Hepatitis B Surface Ag: NEGATIVE

## 2011-08-21 LAB — HIV ANTIBODY (ROUTINE TESTING W REFLEX): HIV: NONREACTIVE

## 2011-08-21 LAB — TSH: TSH: 1.499 u[IU]/mL (ref 0.350–4.500)

## 2011-08-21 NOTE — Progress Notes (Signed)
Mindy Collins [birth year] [id number]   History:    41 y.o.  for annual gyn exam who in January this year had an abdominal myomectomy and left ovarian cystectomy with the following pathology report:  Diagnosis 1. Cyst, biopsy - CONSISTENT WITH BENIGN SEROUS CYST. 2. Ovary, left - BRENNER TUMOR. - MALIGNANT FEATURES ARE NOT IDENTIFIED. 3. Uterine fibroid(s) - LEIOMYOMA. - MALIGNANT FEATURES ARE NOT IDENTIFIED.  Patient been doing well having normal spontaneous menstrual cycle. Patient not sexually active. Patient wanted to have an STD screen. She is overweight. She does have strong family history diabetes. [facility]alkins [facility] has been following her for hypertension. Review of her record indicated that in 1993 patient had cryotherapy for cervical dysplasia at another facility. Also in 2005 she had and treated for external genital warts. Her last Pap smear here in the office 2012 was normal.  Past medical history,surgical history, family history and social history were all reviewed and documented in the EPIC chart.  Gynecologic History Patient's last menstrual period was 08/10/2011. Contraception: none Last Pap: 2012. Results were: normal Last mammogram: 2011. Results were: normal  Obstetric History OB History    Grav Para Term Preterm Abortions TAB SAB Ect Mult Living   0                ROS: A ROS was performed and pertinent positives and negatives are included in the history.  GENERAL: No fevers or chills. HEENT: No change in vision, no earache, sore throat or sinus congestion. NECK: No pain or stiffness. CARDIOVASCULAR: No chest pain or pressure. No palpitations. PULMONARY: No shortness of breath, cough or wheeze. GASTROINTESTINAL: No abdominal pain, nausea, vomiting or diarrhea, melena or bright red blood per rectum. GENITOURINARY: No urinary frequency, urgency, hesitancy or dysuria. MUSCULOSKELETAL: No joint or muscle pain, no back pain, no recent trauma. DERMATOLOGIC: No rash, no  itching, no lesions. ENDOCRINE: No polyuria, polydipsia, no heat or cold intolerance. No recent change in weight. HEMATOLOGICAL: No anemia or easy bruising or bleeding. NEUROLOGIC: No headache, seizures, numbness, tingling or weakness. PSYCHIATRIC: No depression, no loss of interest in normal activity or change in sleep pattern.     Exam: chaperone present  BP 132/90  Ht 5' 1.5" (1.562 m)  Wt 255 lb (115.667 kg)  BMI 47.40 kg/m2  LMP 08/10/2011  Body mass index is 47.40 kg/(m^2).  General appearance : Well developed well nourished female. No acute distress HEENT: Neck supple, trachea midline, no carotid bruits, no thyroidmegaly Lungs: Clear to auscultation, no rhonchi or wheezes, or rib retractions  Heart: Regular rate and rhythm, no murmurs or gallops Breast:Examined in sitting and supine position were symmetrical in appearance, no palpable masses or tenderness,  no skin retraction, no nipple inversion, no nipple discharge, no skin discoloration, no axillary or supraclavicular lymphadenopathy Abdomen: no palpable masses or tenderness, no rebound or guarding Extremities: no edema or skin discoloration or tenderness  Pelvic:  Bartholin, Urethra, Skene Glands: Within normal limits             Vagina: No gross lesions or discharge  Cervix: No gross lesions or discharge  Uterus  anteverted, normal size, shape and consistency, non-tender and mobile  Adnexa  Without masses or tenderness  Anus and perineum  normal   Rectovaginal  normal sphincter tone without palpated masses or tenderness             Hemoccult not done     Assessment/Plan:  41 y.o. female for annual exam  requesting an STD screen. Her gynecological examination was unremarkable. Literature information on cholesterol-lowering diet as well as on exercise was provided. The following labs will be drawn today: Comprehensive metabolic panel, fasting lipid profile, TSH, CBC, urinalysis and Pap smear. Also as part of the STD screen  the following was ordered: HIV, RPR, hepatitis B and C., as well as GC and Chlamydia culture. Patient is instructed to do her monthly self breast examination. We'll see her back in one year in followup with an ultrasound at that point as well.    Ok Edwards MD, 11:55 AM 08/21/2011

## 2011-08-21 NOTE — Patient Instructions (Signed)
Cholesterol Control Diet  Cholesterol levels in your body are determined significantly by your diet. Cholesterol levels may also be related to heart disease. The following material helps to explain this relationship and discusses what you can do to help keep your heart healthy. Not all cholesterol is bad. Low-density lipoprotein (LDL) cholesterol is the "bad" cholesterol. It may cause fatty deposits to build up inside your arteries. High-density lipoprotein (HDL) cholesterol is "good." It helps to remove the "bad" LDL cholesterol from your blood. Cholesterol is a very important risk factor for heart disease. Other risk factors are high blood pressure, smoking, stress, heredity, and weight. The heart muscle gets its supply of blood through the coronary arteries. If your LDL cholesterol is high and your HDL cholesterol is low, you are at risk for having fatty deposits build up in your coronary arteries. This leaves less room through which blood can flow. Without sufficient blood and oxygen, the heart muscle cannot function properly and you may feel chest pains (angina pectoris). When a coronary artery closes up entirely, a part of the heart muscle may die, causing a heart attack (myocardial infarction). CHECKING CHOLESTEROL When your caregiver sends your blood to a lab to be analyzed for cholesterol, a complete lipid (fat) profile may be done. With this test, the total amount of cholesterol and levels of LDL and HDL are determined. Triglycerides are a type of fat that circulates in the blood and can also be used to determine heart disease risk. The list below describes what the numbers should be: Test: Total Cholesterol.  Less than 200 mg/dl.  Test: LDL "bad cholesterol."  Less than 100 mg/dl.   Less than 70 mg/dl if you are at very high risk of a heart attack or sudden cardiac death.  Test: HDL "good cholesterol."  Greater than 50 mg/dl for women.    Greater than 40 mg/dl for men.  Test: Triglycerides.  Less than 150 mg/dl.  CONTROLLING CHOLESTEROL WITH DIET Although exercise and lifestyle factors are important, your diet is key. That is because certain foods are known to raise cholesterol and others to lower it. The goal is to balance foods for their effect on cholesterol and more importantly, to replace saturated and trans fat with other types of fat, such as monounsaturated fat, polyunsaturated fat, and omega-3 fatty acids. On average, a person should consume no more than 15 to 17 g of saturated fat daily. Saturated and trans fats are considered "bad" fats, and they will raise LDL cholesterol. Saturated fats are primarily found in animal products such as meats, butter, and cream. However, that does not mean you need to sacrifice all your favorite foods. Today, there are good tasting, low-fat, low-cholesterol substitutes for most of the things you like to eat. Choose low-fat or nonfat alternatives. Choose round or loin cuts of red meat, since these types of cuts are lowest in fat and cholesterol. Chicken (without the skin), fish, veal, and ground turkey breast are excellent choices. Eliminate fatty meats, such as hot dogs and salami. Even shellfish have little or no saturated fat. Have a 3 oz (85 g) portion when you eat lean meat, poultry, or fish. Trans fats are also called "partially hydrogenated oils." They are oils that have been scientifically manipulated so that they are solid at room temperature resulting in a longer shelf life and improved taste and texture of foods in which they are added. Trans fats are found in stick margarine, some tub margarines, cookies, crackers, and baked goods.    When baking and cooking, oils are an excellent substitute for butter. The monounsaturated oils are especially beneficial since it is believed they lower LDL and raise HDL. The oils you should avoid entirely are saturated tropical oils, such as coconut and  palm.  Remember to eat liberally from food groups that are naturally free of saturated and trans fat, including fish, fruit, vegetables, beans, grains (barley, rice, couscous, bulgur wheat), and pasta (without cream sauces).  IDENTIFYING FOODS THAT LOWER CHOLESTEROL  Soluble fiber may lower your cholesterol. This type of fiber is found in fruits such as apples, vegetables such as broccoli, potatoes, and carrots, legumes such as beans, peas, and lentils, and grains such as barley. Foods fortified with plant sterols (phytosterol) may also lower cholesterol. You should eat at least 2 g per day of these foods for a cholesterol lowering effect.  Read package labels to identify low-saturated fats, trans fats free, and low-fat foods at the supermarket. Select cheeses that have only 2 to 3 g saturated fat per ounce. Use a heart-healthy tub margarine that is free of trans fats or partially hydrogenated oil. When buying baked goods (cookies, crackers), avoid partially hydrogenated oils. Breads and muffins should be made from whole grains (whole-wheat or whole oat flour, instead of "flour" or "enriched flour"). Buy non-creamy canned soups with reduced salt and no added fats.  FOOD PREPARATION TECHNIQUES  Never deep-fry. If you must fry, either stir-fry, which uses very little fat, or use non-stick cooking sprays. When possible, broil, bake, or roast meats, and steam vegetables. Instead of dressing vegetables with butter or margarine, use lemon and herbs, applesauce and cinnamon (for squash and sweet potatoes), nonfat yogurt, salsa, and low-fat dressings for salads.  LOW-SATURATED FAT / LOW-FAT FOOD SUBSTITUTES Meats / Saturated Fat (g)  Avoid: Steak, marbled (3 oz/85 g) / 11 g   Choose: Steak, lean (3 oz/85 g) / 4 g   Avoid: Hamburger (3 oz/85 g) / 7 g   Choose: Hamburger, lean (3 oz/85 g) / 5 g   Avoid: Ham (3 oz/85 g) / 6 g   Choose: Ham, lean cut (3 oz/85 g) / 2.4 g   Avoid: Chicken, with skin, dark  meat (3 oz/85 g) / 4 g   Choose: Chicken, skin removed, dark meat (3 oz/85 g) / 2 g   Avoid: Chicken, with skin, light meat (3 oz/85 g) / 2.5 g   Choose: Chicken, skin removed, light meat (3 oz/85 g) / 1 g  Dairy / Saturated Fat (g)  Avoid: Whole milk (1 cup) / 5 g   Choose: Low-fat milk, 2% (1 cup) / 3 g   Choose: Low-fat milk, 1% (1 cup) / 1.5 g   Choose: Skim milk (1 cup) / 0.3 g   Avoid: Hard cheese (1 oz/28 g) / 6 g   Choose: Skim milk cheese (1 oz/28 g) / 2 to 3 g   Avoid: Cottage cheese, 4% fat (1 cup) / 6.5 g   Choose: Low-fat cottage cheese, 1% fat (1 cup) / 1.5 g   Avoid: Ice cream (1 cup) / 9 g   Choose: Sherbet (1 cup) / 2.5 g   Choose: Nonfat frozen yogurt (1 cup) / 0.3 g   Choose: Frozen fruit bar / trace   Avoid: Whipped cream (1 tbs) / 3.5 g   Choose: Nondairy whipped topping (1 tbs) / 1 g  Condiments / Saturated Fat (g)  Avoid: Mayonnaise (1 tbs) / 2 g   Choose: Low-fat   mayonnaise (1 tbs) / 1 g   Avoid: Butter (1 tbs) / 7 g   Choose: Extra light margarine (1 tbs) / 1 g   Avoid: Coconut oil (1 tbs) / 11.8 g   Choose: Olive oil (1 tbs) / 1.8 g   Choose: Corn oil (1 tbs) / 1.7 g   Choose: Safflower oil (1 tbs) / 1.2 g   Choose: Sunflower oil (1 tbs) / 1.4 g   Choose: Soybean oil (1 tbs) / 2.4 g   Choose: Canola oil (1 tbs) / 1 g  Document Released: 01/27/2005 Document Revised: 10/09/2010 Document Reviewed: 07/18/2010 [employer] Patient Information 2012 [employer].  Exercise to Lose Weight Exercise and a healthy diet may help you lose weight. Your doctor may suggest specific exercises. EXERCISE IDEAS AND TIPS  Choose low-cost things you enjoy doing, such as walking, bicycling, or exercising to workout videos.   Take stairs instead of the elevator.   Walk during your lunch break.   Park your car further away from work or school.   Go to a gym or an exercise class.   Start with 5 to 10 minutes of exercise each day. Build up to  30 minutes of exercise 4 to 6 days a week.   Wear shoes with good support and comfortable clothes.   Stretch before and after working out.   Work out until you breathe harder and your heart beats faster.   Drink extra water when you exercise.   Do not do so much that you hurt yourself, feel dizzy, or get very short of breath.  Exercises that burn about 150 calories:  Running 1  miles in 15 minutes.   Playing volleyball for 45 to 60 minutes.   Washing and waxing a car for 45 to 60 minutes.   Playing touch football for 45 minutes.   Walking 1  miles in 35 minutes.   Pushing a stroller 1  miles in 30 minutes.   Playing basketball for 30 minutes.   Raking leaves for 30 minutes.   Bicycling 5 miles in 30 minutes.   Walking 2 miles in 30 minutes.   Dancing for 30 minutes.   Shoveling snow for 15 minutes.   Swimming laps for 20 minutes.   Walking up stairs for 15 minutes.   Bicycling 4 miles in 15 minutes.   Gardening for 30 to 45 minutes.   Jumping rope for 15 minutes.   Washing windows or floors for 45 to 60 minutes.  Document Released: 03/01/2010 Document Revised: 10/09/2010 Document Reviewed: 03/01/2010 [employer] Patient Information 2012 [employer]. Health Maintenance, Females A healthy lifestyle and preventative care can promote health and wellness.  Maintain regular health, dental, and eye exams.   Eat a healthy diet. Foods like vegetables, fruits, whole grains, low-fat dairy products, and lean protein foods contain the nutrients you need without too many calories. Decrease your intake of foods high in solid fats, added sugars, and salt. Get information about a proper diet from your caregiver, if necessary.   Regular physical exercise is one of the most important things you can do for your health. Most adults should get at least 150 minutes of moderate-intensity exercise (any activity that increases your heart rate and causes you to sweat) each week. In  addition, most adults need muscle-strengthening exercises on 2 or more days a week.    Maintain a healthy weight. The body mass index (BMI) is a screening tool to identify possible weight problems. It provides   an estimate of body fat based on height and weight. Your caregiver can help determine your BMI, and can help you achieve or maintain a healthy weight. For adults 20 years and older:   A BMI below 18.5 is considered underweight.   A BMI of 18.5 to 24.9 is normal.   A BMI of 25 to 29.9 is considered overweight.   A BMI of 30 and above is considered obese.   Maintain normal blood lipids and cholesterol by exercising and minimizing your intake of saturated fat. Eat a balanced diet with plenty of fruits and vegetables. Blood tests for lipids and cholesterol should begin at age 20 and be repeated every 5 years. If your lipid or cholesterol levels are high, you are over 50, or you are a high risk for heart disease, you may need your cholesterol levels checked more frequently.Ongoing high lipid and cholesterol levels should be treated with medicines if diet and exercise are not effective.   If you smoke, find out from your caregiver how to quit. If you do not use tobacco, do not start.   If you are pregnant, do not drink alcohol. If you are breastfeeding, be very cautious about drinking alcohol. If you are not pregnant and choose to drink alcohol, do not exceed 1 drink per day. One drink is considered to be 12 ounces (355 mL) of beer, 5 ounces (148 mL) of wine, or 1.5 ounces (44 mL) of liquor.   Avoid use of street drugs. Do not share needles with anyone. Ask for help if you need support or instructions about stopping the use of drugs.   High blood pressure causes heart disease and increases the risk of stroke. Blood pressure should be checked at least every 1 to 2 years. Ongoing high blood pressure should be treated with medicines, if weight loss and exercise are not effective.   If you are 55  to 39 years old, ask your caregiver if you should take aspirin to prevent strokes.   Diabetes screening involves taking a blood sample to check your fasting blood sugar level. This should be done once every 3 years, after age 45, if you are within normal weight and without risk factors for diabetes. Testing should be considered at a younger age or be carried out more frequently if you are overweight and have at least 1 risk factor for diabetes.   Breast cancer screening is essential preventative care for women. You should practice "breast self-awareness." This means understanding the normal appearance and feel of your breasts and may include breast self-examination. Any changes detected, no matter how small, should be reported to a caregiver. Women in their 20s and 30s should have a clinical breast exam (CBE) by a caregiver as part of a regular health exam every 1 to 3 years. After age 40, women should have a CBE every year. Starting at age 40, women should consider having a mammogram (breast X-ray) every year. Women who have a family history of breast cancer should talk to their caregiver about genetic screening. Women at a high risk of breast cancer should talk to their caregiver about having an MRI and a mammogram every year.   The Pap test is a screening test for cervical cancer. Women should have a Pap test starting at age 21. Between ages 21 and 29, Pap tests should be repeated every 2 years. Beginning at age 30, you should have a Pap test every 3 years as long as the past 3 Pap tests   have been normal. If you had a hysterectomy for a problem that was not cancer or a condition that could lead to cancer, then you no longer need Pap tests. If you are between ages 65 and 70, and you have had normal Pap tests going back 10 years, you no longer need Pap tests. If you have had past treatment for cervical cancer or a condition that could lead to cancer, you need Pap tests and screening for cancer for at least 20  years after your treatment. If Pap tests have been discontinued, risk factors (such as a new sexual partner) need to be reassessed to determine if screening should be resumed. Some women have medical problems that increase the chance of getting cervical cancer. In these cases, your caregiver may recommend more frequent screening and Pap tests.   The human papillomavirus (HPV) test is an additional test that may be used for cervical cancer screening. The HPV test looks for the virus that can cause the cell changes on the cervix. The cells collected during the Pap test can be tested for HPV. The HPV test could be used to screen women aged 30 years and older, and should be used in women of any age who have unclear Pap test results. After the age of 30, women should have HPV testing at the same frequency as a Pap test.   Colorectal cancer can be detected and often prevented. Most routine colorectal cancer screening begins at the age of 50 and continues through age 75. However, your caregiver may recommend screening at an earlier age if you have risk factors for colon cancer. On a yearly basis, your caregiver may provide home test kits to check for hidden blood in the stool. Use of a small camera at the end of a tube, to directly examine the colon (sigmoidoscopy or colonoscopy), can detect the earliest forms of colorectal cancer. Talk to your caregiver about this at age 50, when routine screening begins. Direct examination of the colon should be repeated every 5 to 10 years through age 75, unless early forms of pre-cancerous polyps or small growths are found.   Hepatitis C blood testing is recommended for all people born from 1945 through 1965 and any individual with known risks for hepatitis C.   Practice safe sex. Use condoms and avoid high-risk sexual practices to reduce the spread of sexually transmitted infections (STIs). Sexually active women aged 25 and younger should be checked for Chlamydia, which is a  common sexually transmitted infection. Older women with new or multiple partners should also be tested for Chlamydia. Testing for other STIs is recommended if you are sexually active and at increased risk.   Osteoporosis is a disease in which the bones lose minerals and strength with aging. This can result in serious bone fractures. The risk of osteoporosis can be identified using a bone density scan. Women ages 65 and over and women at risk for fractures or osteoporosis should discuss screening with their caregivers. Ask your caregiver whether you should be taking a calcium supplement or vitamin D to reduce the rate of osteoporosis.   Menopause can be associated with physical symptoms and risks. Hormone replacement therapy is available to decrease symptoms and risks. You should talk to your caregiver about whether hormone replacement therapy is right for you.   Use sunscreen with a sun protection factor (SPF) of 30 or greater. Apply sunscreen liberally and repeatedly throughout the day. You should seek shade when your shadow is shorter than   you. Protect yourself by wearing long sleeves, pants, a wide-brimmed hat, and sunglasses year round, whenever you are outdoors.   Notify your caregiver of new moles or changes in moles, especially if there is a change in shape or color. Also notify your caregiver if a mole is larger than the size of a pencil eraser.   Stay current with your immunizations.  Document Released: 08/12/2010 Document Revised: 01/16/2011 Document Reviewed: 08/12/2010 [employer] Patient Information 2012 [employer]. 

## 2011-08-22 LAB — URINALYSIS W MICROSCOPIC + REFLEX CULTURE
Bacteria, UA: NONE SEEN
Bilirubin Urine: NEGATIVE
Casts: NONE SEEN
Crystals: NONE SEEN
Glucose, UA: NEGATIVE mg/dL
Hgb urine dipstick: NEGATIVE
Ketones, ur: NEGATIVE mg/dL
Leukocytes, UA: NEGATIVE
Nitrite: NEGATIVE
Protein, ur: NEGATIVE mg/dL
Specific Gravity, Urine: 1.026 (ref 1.005–1.030)
Urobilinogen, UA: 0.2 mg/dL (ref 0.0–1.0)
pH: 6 (ref 5.0–8.0)

## 2011-08-22 LAB — GC/CHLAMYDIA PROBE AMP, GENITAL
Chlamydia, DNA Probe: NEGATIVE
GC Probe Amp, Genital: NEGATIVE

## 2011-08-22 LAB — RPR

## 2011-08-25 ENCOUNTER — Ambulatory Visit: Payer: PRIVATE HEALTH INSURANCE

## 2011-08-28 ENCOUNTER — Telehealth: Payer: Self-pay | Admitting: *Deleted

## 2011-08-28 ENCOUNTER — Ambulatory Visit: Payer: PRIVATE HEALTH INSURANCE | Admitting: Physical Therapy

## 2011-08-28 NOTE — Telephone Encounter (Signed)
Pt informed of recent STD screening results.

## 2011-09-04 ENCOUNTER — Encounter: Payer: PRIVATE HEALTH INSURANCE | Admitting: Physical Therapy

## 2011-09-04 ENCOUNTER — Encounter: Payer: PRIVATE HEALTH INSURANCE | Admitting: Gynecology

## 2011-09-15 ENCOUNTER — Ambulatory Visit: Payer: BC Managed Care – PPO | Attending: Sports Medicine | Admitting: Physical Therapy

## 2011-09-15 DIAGNOSIS — M25569 Pain in unspecified knee: Secondary | ICD-10-CM | POA: Insufficient documentation

## 2011-09-15 DIAGNOSIS — IMO0001 Reserved for inherently not codable concepts without codable children: Secondary | ICD-10-CM | POA: Insufficient documentation

## 2011-09-15 DIAGNOSIS — R293 Abnormal posture: Secondary | ICD-10-CM | POA: Insufficient documentation

## 2011-09-15 DIAGNOSIS — M6281 Muscle weakness (generalized): Secondary | ICD-10-CM | POA: Insufficient documentation

## 2011-09-22 ENCOUNTER — Ambulatory Visit: Payer: BC Managed Care – PPO | Admitting: Physical Therapy

## 2011-09-22 ENCOUNTER — Encounter: Payer: Self-pay | Admitting: Gynecology

## 2011-09-22 ENCOUNTER — Ambulatory Visit (INDEPENDENT_AMBULATORY_CARE_PROVIDER_SITE_OTHER): Payer: BC Managed Care – PPO | Admitting: Gynecology

## 2011-09-22 VITALS — BP 130/86

## 2011-09-22 DIAGNOSIS — N949 Unspecified condition associated with female genital organs and menstrual cycle: Secondary | ICD-10-CM

## 2011-09-22 DIAGNOSIS — R102 Pelvic and perineal pain: Secondary | ICD-10-CM

## 2011-09-22 DIAGNOSIS — D649 Anemia, unspecified: Secondary | ICD-10-CM

## 2011-09-22 NOTE — Progress Notes (Signed)
41 y.o. for annual gyn exam who in January this year had an abdominal myomectomy and left ovarian cystectomy with the following pathology report:  Diagnosis  1. Cyst, biopsy  - CONSISTENT WITH BENIGN SEROUS CYST.  2. Ovary, left  - BRENNER TUMOR.  - MALIGNANT FEATURES ARE NOT IDENTIFIED.  3. Uterine fibroid(s)  - LEIOMYOMA.  - MALIGNANT FEATURES ARE NOT IDENTIFIED.  Patient presented to the office today stating that on and off she's feeling some twinges in the right lower abdomen. No true pain. She has not been sexually active since the beginning of this year. She states that since her surgery her cycles are now regular and she is not having the pains that she had before her fibroids were removed. She has been getting physical therapy for her back recently and she thought maybe she may have pulled something.  Exam: Abdomen: Soft nontender no rebound or guarding. A Pfannenstiel incision intact no evidence of incisional hernia. Bartholin urethra Skene glands: Within normal limits Vagina: No lesions or discharge Cervix: No lesion or discharge Uterus: Anteverted normal size and shape although somewhat limited due to patient's abdominal girth Adnexa: Limited due to patient's abdominal girth Rectal exam: Not done  Assessment/plan patient with right lower quadrant abdominal twinges and pulling sensation but not debilitating. To give her a piece of mind we will do an ultrasound later this week since she's had history of an ovarian cyst on the contralateral ovary that was removed during surgery. Will check a CBC as well and she had anemia postop and has recently stopped taking her iron tablet.

## 2011-09-23 ENCOUNTER — Other Ambulatory Visit: Payer: Self-pay | Admitting: Gynecology

## 2011-09-23 DIAGNOSIS — D72829 Elevated white blood cell count, unspecified: Secondary | ICD-10-CM

## 2011-09-23 DIAGNOSIS — O09899 Supervision of other high risk pregnancies, unspecified trimester: Secondary | ICD-10-CM

## 2011-09-23 LAB — CBC WITH DIFFERENTIAL/PLATELET
Basophils Absolute: 0 10*3/uL (ref 0.0–0.1)
Basophils Relative: 0 % (ref 0–1)
Eosinophils Absolute: 0.1 10*3/uL (ref 0.0–0.7)
Eosinophils Relative: 1 % (ref 0–5)
HCT: 39 % (ref 36.0–46.0)
Hemoglobin: 12.6 g/dL (ref 12.0–15.0)
Lymphocytes Relative: 21 % (ref 12–46)
Lymphs Abs: 2.8 10*3/uL (ref 0.7–4.0)
MCH: 26.5 pg (ref 26.0–34.0)
MCHC: 32.3 g/dL (ref 30.0–36.0)
MCV: 81.9 fL (ref 78.0–100.0)
Monocytes Absolute: 1 10*3/uL (ref 0.1–1.0)
Monocytes Relative: 8 % (ref 3–12)
Neutro Abs: 9.5 10*3/uL — ABNORMAL HIGH (ref 1.7–7.7)
Neutrophils Relative %: 70 % (ref 43–77)
Platelets: 454 10*3/uL — ABNORMAL HIGH (ref 150–400)
RBC: 4.76 MIL/uL (ref 3.87–5.11)
RDW: 15.5 % (ref 11.5–15.5)
WBC: 13.4 10*3/uL — ABNORMAL HIGH (ref 4.0–10.5)

## 2011-09-24 ENCOUNTER — Encounter: Payer: BC Managed Care – PPO | Admitting: Physical Therapy

## 2011-09-26 ENCOUNTER — Ambulatory Visit (INDEPENDENT_AMBULATORY_CARE_PROVIDER_SITE_OTHER): Payer: BC Managed Care – PPO | Admitting: Gynecology

## 2011-09-26 ENCOUNTER — Ambulatory Visit (INDEPENDENT_AMBULATORY_CARE_PROVIDER_SITE_OTHER): Payer: BC Managed Care – PPO

## 2011-09-26 ENCOUNTER — Encounter: Payer: Self-pay | Admitting: Gynecology

## 2011-09-26 VITALS — BP 128/80

## 2011-09-26 DIAGNOSIS — R109 Unspecified abdominal pain: Secondary | ICD-10-CM

## 2011-09-26 DIAGNOSIS — R102 Pelvic and perineal pain: Secondary | ICD-10-CM

## 2011-09-26 DIAGNOSIS — N949 Unspecified condition associated with female genital organs and menstrual cycle: Secondary | ICD-10-CM

## 2011-09-26 DIAGNOSIS — K589 Irritable bowel syndrome without diarrhea: Secondary | ICD-10-CM

## 2011-09-26 NOTE — Progress Notes (Signed)
Patient was seen the office on August 12 of this year complaining of on and off low abdominal twinges especially the right lower abdomen. She does suffer at times with some diarrhea which changes into constipation. She stated many years ago she had been diagnosed with colitis. She has not seen a gastroenterologist many years.  Review of her record indicated that in January this year she underwent an abdominal myomectomy along with a left ovarian cystectomy with the following pathology report:  Diagnosis  1. Cyst, biopsy  - CONSISTENT WITH BENIGN SEROUS CYST.  2. Ovary, left  - BRENNER TUMOR.  - MALIGNANT FEATURES ARE NOT IDENTIFIED.  3. Uterine fibroid(s)  - LEIOMYOMA.  - MALIGNANT FEATURES ARE NOT IDENTIFIED  Due to patient's abdominal girth pelvic examination was somewhat limited so she was asked to return today for followup ultrasound. The ultrasound demonstrated the following:  Uterus measured 9.7 x 6.2 x 5.9 cm with endometrial stripe of 6.3 mm normal-appearing uterus and ovaries. Right ovarian follicle measuring 20 mm was noted. No free fluid in the cul-de-sac. Otherwise normal ultrasound.  Assessment/plan: Patient's abdominal symptomatology may be attributed to IBS especially with past history of colitis. She was reassured the pelvic ultrasound was normal. She may resume all normal activity as previously recommended. She will make a followup appointment to see a gastroenterologist. We will otherwise see her back in one year or when necessary.

## 2011-09-29 ENCOUNTER — Ambulatory Visit: Payer: BC Managed Care – PPO | Admitting: Physical Therapy

## 2011-09-30 DIAGNOSIS — K589 Irritable bowel syndrome without diarrhea: Secondary | ICD-10-CM | POA: Insufficient documentation

## 2011-10-01 ENCOUNTER — Encounter: Payer: BC Managed Care – PPO | Admitting: Physical Therapy

## 2011-10-08 ENCOUNTER — Ambulatory Visit: Payer: BC Managed Care – PPO | Admitting: Physical Therapy

## 2011-10-08 ENCOUNTER — Encounter: Payer: BC Managed Care – PPO | Admitting: Physical Therapy

## 2011-11-03 ENCOUNTER — Encounter: Payer: Self-pay | Admitting: Gynecology

## 2011-11-03 ENCOUNTER — Ambulatory Visit (INDEPENDENT_AMBULATORY_CARE_PROVIDER_SITE_OTHER): Payer: BC Managed Care – PPO | Admitting: Gynecology

## 2011-11-03 VITALS — BP 128/86

## 2011-11-03 DIAGNOSIS — R102 Pelvic and perineal pain: Secondary | ICD-10-CM

## 2011-11-03 DIAGNOSIS — N9489 Other specified conditions associated with female genital organs and menstrual cycle: Secondary | ICD-10-CM

## 2011-11-03 DIAGNOSIS — K5900 Constipation, unspecified: Secondary | ICD-10-CM

## 2011-11-03 LAB — URINALYSIS W MICROSCOPIC + REFLEX CULTURE
Bilirubin Urine: NEGATIVE
Glucose, UA: NEGATIVE mg/dL
Hgb urine dipstick: NEGATIVE
Ketones, ur: NEGATIVE mg/dL
Leukocytes, UA: NEGATIVE
Nitrite: NEGATIVE
Protein, ur: NEGATIVE mg/dL
Specific Gravity, Urine: 1.02 (ref 1.005–1.030)
Urobilinogen, UA: 0.2 mg/dL (ref 0.0–1.0)
pH: 5.5 (ref 5.0–8.0)

## 2011-11-03 NOTE — Patient Instructions (Addendum)
Follow up with Dr. Kinnie Scales next week. If after his evaluation and treatment for IBS is completed and symptoms continue I will need you to come back to office for a repeat ultrasound.

## 2011-11-03 NOTE — Progress Notes (Signed)
Patient was seen the office on August 12 of this year complaining of on and off low abdominal twinges especially the right lower abdomen. She does suffer at times with some diarrhea which changes into constipation. She is scheduled to see Dr. Justice Britain (gastroenterologist) next week.She stated many years ago she had been diagnosed with colitis. She has not seen a gastroenterologist in many years.   Review of her record indicated that in January this year she underwent an abdominal myomectomy along with a left ovarian cystectomy with the following pathology report:   Diagnosis  1. Cyst, biopsy  - CONSISTENT WITH BENIGN SEROUS CYST.  2. Ovary, left  - BRENNER TUMOR.  - MALIGNANT FEATURES ARE NOT IDENTIFIED.  3. Uterine fibroid(s)  - LEIOMYOMA.  - MALIGNANT FEATURES ARE NOT IDENTIFIED   Due to patient's abdominal girth pelvic examination was somewhat limited so she underwent an ultrasound. The ultrasound on August 20 demonstrated the following:   Uterus measured 9.7 x 6.2 x 5.9 cm with endometrial stripe of 6.3 mm normal-appearing uterus and ovaries. Right ovarian follicle measuring 20 mm was noted. No free fluid in the cul-de-sac. Otherwise normal ultrasound.  She returns to office today with similar complaints and a vaginal fullness sensation. She denies any discharge or being sexually active. No dysuria or frequency, no fever, chills, nausea or vomiting. Normal menstrual cycles are reported.  Urinalysis negative  Assessment/plan: Recent normal ultrasound August 20. Negative urinalysis. Appears that her symptoms may be attributed to her IBS. She scheduled to see gastroenterologist next week. After his evaluation and treatment the patient persists with the symptoms of the next 3 months and she will return back to the office and we'll repeat the ultrasound. We will send her urine for culture.

## 2011-11-05 LAB — URINE CULTURE: Colony Count: 4000

## 2011-12-18 ENCOUNTER — Emergency Department (HOSPITAL_COMMUNITY)
Admission: EM | Admit: 2011-12-18 | Discharge: 2011-12-19 | Disposition: A | Discharge status: Home / Self Care | Payer: BC Managed Care – PPO | Attending: Emergency Medicine | Admitting: Emergency Medicine

## 2011-12-18 DIAGNOSIS — E669 Obesity, unspecified: Secondary | ICD-10-CM | POA: Insufficient documentation

## 2011-12-18 DIAGNOSIS — Z8719 Personal history of other diseases of the digestive system: Secondary | ICD-10-CM | POA: Insufficient documentation

## 2011-12-18 DIAGNOSIS — Z8619 Personal history of other infectious and parasitic diseases: Secondary | ICD-10-CM | POA: Insufficient documentation

## 2011-12-18 DIAGNOSIS — H1130 Conjunctival hemorrhage, unspecified eye: Secondary | ICD-10-CM

## 2011-12-18 DIAGNOSIS — D649 Anemia, unspecified: Secondary | ICD-10-CM | POA: Insufficient documentation

## 2011-12-18 DIAGNOSIS — N946 Dysmenorrhea, unspecified: Secondary | ICD-10-CM | POA: Insufficient documentation

## 2011-12-18 DIAGNOSIS — K219 Gastro-esophageal reflux disease without esophagitis: Secondary | ICD-10-CM | POA: Insufficient documentation

## 2011-12-18 DIAGNOSIS — I10 Essential (primary) hypertension: Secondary | ICD-10-CM | POA: Insufficient documentation

## 2011-12-18 NOTE — ED Provider Notes (Signed)
History     CSN: [id number]  Arrival date & time 12/18/11  2324   First MD Initiated Contact with Patient 12/18/11 2332      Chief Complaint  Patient presents with  . Eye Problem    (Consider location/radiation/quality/duration/timing/severity/associated sxs/prior treatment) HPI Comments: Patient is a 41 year old female with a history of hypertension presents to the emergency department complaining of eye redness.  Onset of symptoms began last evening and was noticed after somebody pointed out the visual defect.  Patient denies any visual disturbance including diplopia, floaters, double vision or other ocular symptoms like I pain, itching, discharge, tearing, photophobia.  Associated symptoms include intermittent mild headaches that are not currently present.  Patient denies any extremity weakness or numbness.  No other complaints at this time.  Patient is a 41 y.o. female presenting with eye problem. The history is provided by the patient.  Eye Problem  Associated symptoms include eye redness. Pertinent negatives include no discharge and no photophobia.    Past Medical History  Diagnosis Date  . HPV (human papilloma virus) infection     WARTS/ TCA [place]  . Dysmenorrhea   . Hypertension   . Obesity   . GERD (gastroesophageal reflux disease)   . Seasonal allergies   . Anemia   . Fatty liver   . Colitis     Past Surgical History  Procedure Date  . Gynecologic cryosurgery 1993    DYSPLASIA CERVIX/   . Myomectomy abdominal approach 04/31/2007  . Myomectomy 02/17/2011    Procedure: MYOMECTOMY;  Surgeon: Ok Edwards, MD;  Location: [facility];  Service: Gynecology;  Laterality: N/A;  I am hoping to get a 7:30am time on Dec 4, Dec 11 or Dec 12. It not available I will check on some 1:00 pm times. Thanks  . Ovarian cyst removal 02/17/2011    Procedure: OVARIAN CYSTECTOMY;  Surgeon: Ok Edwards, MD;  Location: [facility];  Service: Gynecology;  Laterality: Left;    Family History    Problem Relation Age of Onset  . Hypertension Mother   . Heart disease Maternal Grandmother   . Cancer Father     LUNG  . Breast cancer Maternal Aunt   . Diabetes Maternal Grandfather     History  Substance Use Topics  . Smoking status: Never Smoker   . Smokeless tobacco: Never Used     Comment: denies tobacco   . Alcohol Use: No    OB History    Grav Para Term Preterm Abortions TAB SAB Ect Mult Living   0               Review of Systems  Constitutional: Negative for fever, diaphoresis and activity change.  HENT: Negative for congestion and neck pain.   Eyes: Positive for redness. Negative for photophobia, pain, discharge, itching and visual disturbance.  Respiratory: Negative for cough.   Genitourinary: Negative for dysuria.  Musculoskeletal: Negative for myalgias.  Skin: Negative for color change and wound.  Neurological: Negative for headaches.  All other systems reviewed and are negative.    Allergies  Esomeprazole magnesium and Latex  Home Medications   Current Outpatient Rx  Name  Route  Sig  Dispense  Refill  . MELOXICAM 15 MG PO TABS   Oral   Take 15 mg by mouth daily.         Marland Kitchen ONE-DAILY MULTI VITAMINS PO TABS   Oral   Take 1 tablet by mouth daily.         Marland Kitchen  OLMESARTAN MEDOXOMIL-HCTZ 20-12.5 MG PO TABS   Oral   Take 1 tablet by mouth daily.         Marland Kitchen PREDNISONE 20 MG PO TABS   Oral   Take 20 mg by mouth daily.           BP 146/94  Pulse 100  Temp 99.3 F (37.4 C) (Oral)  Resp 17  SpO2 99%  Physical Exam  Nursing note and vitals reviewed. Constitutional: She is oriented to person, place, and time. She appears well-developed and well-nourished. No distress.  HENT:  Head: Normocephalic and atraumatic.  Eyes: Conjunctivae normal and EOM are normal. Pupils are equal, round, and reactive to light. No foreign body present in the left eye.       Superior subconjunctival hemorrhage on left eye, flat and smooth, bright red, limited to  bulbar conjunctiva & sharply demarcated by limbus No tenderness to palpation over temporal arteries or orbital region. Pain free EOMs, visual acuity equal bilaterally, no increase in IOPs, no proptosis, lid swelling, hyphema, purulent discharge from eyes, or consensual photophobia.  Eyelids everted, no evidence of FB.    Neck: Normal range of motion. Neck supple.  Pulmonary/Chest: Effort normal.  Neurological: She is alert and oriented to person, place, and time.  Skin: Skin is warm and dry. No rash noted. She is not diaphoretic.  Psychiatric: Her behavior is normal.    ED Course  Procedures (including critical care time)  Labs Reviewed - No data to display No results found.   No diagnosis found.    MDM  subconjunctival hemorrhage  Eye exam c/w subconjunctival hemorrhage. No visual changes or pain. Pt is asymptomatic through out hospital stay. Discussed cold compresses & resolving on own in about 2 weeks. Recommended f-u w opthalmology d/t bilateral decreased visual acuity and return precautions discussed.            Jaci Carrel, [place] 12/18/11 2356

## 2011-12-18 NOTE — ED Notes (Signed)
Pt has reddened area to top of L eye since tonight. Pt denies pain and itching. Pt states she has had headaches recently. Pt denies visual disturbances.

## 2011-12-19 NOTE — ED Provider Notes (Signed)
Medical screening examination/treatment/procedure(s) were performed by non-physician practitioner and as supervising physician I was immediately available for consultation/collaboration.  Sunnie Nielsen, MD 12/19/11 0630

## 2012-04-02 ENCOUNTER — Ambulatory Visit (INDEPENDENT_AMBULATORY_CARE_PROVIDER_SITE_OTHER): Payer: BC Managed Care – PPO | Admitting: Gynecology

## 2012-04-02 ENCOUNTER — Encounter: Payer: Self-pay | Admitting: Gynecology

## 2012-04-02 VITALS — BP 130/86

## 2012-04-02 DIAGNOSIS — N9489 Other specified conditions associated with female genital organs and menstrual cycle: Secondary | ICD-10-CM

## 2012-04-02 DIAGNOSIS — N907 Vulvar cyst: Secondary | ICD-10-CM

## 2012-04-02 NOTE — Patient Instructions (Addendum)

## 2012-04-02 NOTE — Progress Notes (Signed)
Patient presented to the office today stating that she noticed these small bumps on her right labia. She has not been sexually active and 11 months. She is otherwise been doing well. Exam: Bartholin urethra Skene glands within normal limits Right labia majora 2 small epidermal inclusion cysts (sebaceous cyst) which was squeezed in the office and sebum extruded. It was non-erythematous nontender. Vaginal exam with no lesions on inspection Cervix no gross lesions on inspection  Assessment/plan 2 small insignificant sebaceous cyst not infected spontaneously drained upon minimal pressure. Patient to use antibacterial soap and otherwise we'll do well unless she develops any rednessor tenderness or enlarges we would need to see her again. She's otherwise scheduled to return back in a year for her annual exam or when necessary.Marland Kitchen

## 2012-06-13 ENCOUNTER — Emergency Department (HOSPITAL_COMMUNITY)
Admission: EM | Admit: 2012-06-13 | Discharge: 2012-06-13 | Disposition: A | Discharge status: Home / Self Care | Payer: BC Managed Care – PPO | Attending: Emergency Medicine | Admitting: Emergency Medicine

## 2012-06-13 ENCOUNTER — Encounter (HOSPITAL_COMMUNITY): Payer: Self-pay | Admitting: *Deleted

## 2012-06-13 DIAGNOSIS — R55 Syncope and collapse: Secondary | ICD-10-CM | POA: Insufficient documentation

## 2012-06-13 DIAGNOSIS — Z8619 Personal history of other infectious and parasitic diseases: Secondary | ICD-10-CM | POA: Insufficient documentation

## 2012-06-13 DIAGNOSIS — IMO0001 Reserved for inherently not codable concepts without codable children: Secondary | ICD-10-CM | POA: Insufficient documentation

## 2012-06-13 DIAGNOSIS — Z8742 Personal history of other diseases of the female genital tract: Secondary | ICD-10-CM | POA: Insufficient documentation

## 2012-06-13 DIAGNOSIS — Z862 Personal history of diseases of the blood and blood-forming organs and certain disorders involving the immune mechanism: Secondary | ICD-10-CM | POA: Insufficient documentation

## 2012-06-13 DIAGNOSIS — I10 Essential (primary) hypertension: Secondary | ICD-10-CM | POA: Insufficient documentation

## 2012-06-13 DIAGNOSIS — Z8719 Personal history of other diseases of the digestive system: Secondary | ICD-10-CM | POA: Insufficient documentation

## 2012-06-13 DIAGNOSIS — Z3202 Encounter for pregnancy test, result negative: Secondary | ICD-10-CM | POA: Insufficient documentation

## 2012-06-13 DIAGNOSIS — M62838 Other muscle spasm: Secondary | ICD-10-CM | POA: Insufficient documentation

## 2012-06-13 DIAGNOSIS — Z91040 Latex allergy status: Secondary | ICD-10-CM | POA: Insufficient documentation

## 2012-06-13 DIAGNOSIS — M62830 Muscle spasm of back: Secondary | ICD-10-CM

## 2012-06-13 LAB — URINE MICROSCOPIC-ADD ON

## 2012-06-13 LAB — URINALYSIS, ROUTINE W REFLEX MICROSCOPIC
Bilirubin Urine: NEGATIVE
Glucose, UA: NEGATIVE mg/dL
Ketones, ur: NEGATIVE mg/dL
Leukocytes, UA: NEGATIVE
Nitrite: NEGATIVE
Protein, ur: NEGATIVE mg/dL
Specific Gravity, Urine: 1.019 (ref 1.005–1.030)
Urobilinogen, UA: 0.2 mg/dL (ref 0.0–1.0)
pH: 6.5 (ref 5.0–8.0)

## 2012-06-13 LAB — PREGNANCY, URINE: Preg Test, Ur: NEGATIVE

## 2012-06-13 MED ORDER — IBUPROFEN 800 MG PO TABS: 800.0000 mg | ORAL_TABLET | Freq: Three times a day (TID) | ORAL | Status: DC

## 2012-06-13 MED ORDER — METHOCARBAMOL 500 MG PO TABS: 500.0000 mg | ORAL_TABLET | Freq: Two times a day (BID) | ORAL | Status: DC

## 2012-06-13 NOTE — ED Notes (Signed)
Pt from home with reports of near syncope that happened this past Monday with hx of same. Pt denies fall.

## 2012-06-13 NOTE — ED Notes (Signed)
Pt states that she has had "blackouts" while walking that happened in 2008, 2010, and last Monday. Pt reports that while it happened on Monday she hurt her back while preventing herself from falling, however she did not fall or have any trauma. Pt reports being able to see during the blackout. The pain has been intermittent since Monday, however worsens with movement.

## 2012-06-13 NOTE — ED Provider Notes (Signed)
History     CSN: [id number]  Arrival date & time 06/13/12  1155   First MD Initiated Contact with Patient 06/13/12 1256      Chief Complaint  Patient presents with  . Near Syncope  . Back Pain    (Consider location/radiation/quality/duration/timing/severity/associated sxs/prior treatment) HPI Pt is a 42yo female hx of HTN presenting with left sided back pain after near syncope episode on Monday 4/28.  Pt reports bringing in groceries when she experienced a brief "blackout" that lasted only a second.  When able to see again, she noticed she was about to fall so straightened up quickly, steadying herself before falling.  Since then she has experienced intermittent left sided back spasms that last for 1-10 seconds.  Occasionally brought on by certain movements but not all the time.  Spasms go away on their own.  Pt reports 2 other episodes of syncope and near syncope in 2008 and 2010.  She had complete cardiac workup at [facility] last year including echocardiogram, EKG, and stress test.  All of which came back nl.  Pt denies cardiac hx.  Denies hx of seizures or migraines.  LMP currently on.  Past Medical History  Diagnosis Date  . HPV (human papilloma virus) infection     WARTS/ TCA [place]  . Dysmenorrhea   . Hypertension   . Obesity   . GERD (gastroesophageal reflux disease)   . Seasonal allergies   . Anemia   . Fatty liver   . Colitis     Past Surgical History  Procedure Laterality Date  . Gynecologic cryosurgery  1993    DYSPLASIA CERVIX/   . Myomectomy abdominal approach  04/31/2007  . Myomectomy  02/17/2011    Procedure: MYOMECTOMY;  Surgeon: Ok Edwards, MD;  Location: [facility];  Service: Gynecology;  Laterality: N/A;  I am hoping to get a 7:30am time on Dec 4, Dec 11 or Dec 12. It not available I will check on some 1:00 pm times. Thanks  . Ovarian cyst removal  02/17/2011    Procedure: OVARIAN CYSTECTOMY;  Surgeon: Ok Edwards, MD;  Location: [facility];  Service:  Gynecology;  Laterality: Left;    Family History  Problem Relation Age of Onset  . Hypertension Mother   . Heart disease Maternal Grandmother   . Cancer Father     LUNG  . Breast cancer Maternal Aunt   . Diabetes Maternal Grandfather     History  Substance Use Topics  . Smoking status: Never Smoker   . Smokeless tobacco: Never Used     Comment: denies tobacco   . Alcohol Use: No    OB History   Grav Para Term Preterm Abortions TAB SAB Ect Mult Living   0               Review of Systems  Constitutional: Negative for fever, chills and fatigue.  Respiratory: Negative for cough and chest tightness.   Cardiovascular: Negative for chest pain.  Musculoskeletal: Positive for myalgias and back pain. Negative for joint swelling.  All other systems reviewed and are negative.    Allergies  Esomeprazole magnesium and Latex  Home Medications   Current Outpatient Rx  Name  Route  Sig  Dispense  Refill  . loratadine (CLARITIN) 10 MG tablet   Oral   Take 10 mg by mouth daily.         . Multiple Vitamin (MULTIVITAMIN) tablet   Oral   Take 1 tablet by mouth  daily.         . olmesartan-hydrochlorothiazide (BENICAR HCT) 20-12.5 MG per tablet   Oral   Take 1 tablet by mouth daily.         Marland Kitchen ibuprofen (ADVIL,MOTRIN) 800 MG tablet   Oral   Take 1 tablet (800 mg total) by mouth 3 (three) times daily.   21 tablet   0   . methocarbamol (ROBAXIN) 500 MG tablet   Oral   Take 1 tablet (500 mg total) by mouth 2 (two) times daily.   20 tablet   0     BP 163/93  Pulse 95  Temp(Src) 99.1 F (37.3 C) (Oral)  Resp 20  SpO2 98%  Physical Exam  Constitutional: She appears well-developed and well-nourished. No distress.  Morbidly obese female sitting on edge of exam bed.  NAD.  HENT:  Head: Normocephalic and atraumatic.  Eyes: Conjunctivae are normal. No scleral icterus.  Neck: Normal range of motion. Neck supple.  Neck is nontender, no nuchal rigidity. FROM w/o  pain.   Cardiovascular: Normal rate, regular rhythm and normal heart sounds.   Pulmonary/Chest: Effort normal and breath sounds normal. No respiratory distress. She has no wheezes.  Musculoskeletal: Normal range of motion. She exhibits tenderness ( Left thoracic and lumbar region.  No spasm appreciated. ).  No spinal tenderness. No step-offs or crepitus  Neurological: She is alert.  Skin: Skin is warm and dry. She is not diaphoretic.  Psychiatric: She has a normal mood and affect. Her behavior is normal. Judgment and thought content normal.    ED Course  Procedures (including critical care time)  Labs Reviewed  URINALYSIS, ROUTINE W REFLEX MICROSCOPIC - Abnormal; Notable for the following:    Hgb urine dipstick SMALL (*)    All other components within normal limits  PREGNANCY, URINE  URINE MICROSCOPIC-ADD ON   No results found.  Date: 06/13/2012  Rate: 86  Rhythm: normal sinus rhythm  QRS Axis: normal  Intervals: normal  ST/T Wave abnormalities: normal  Conduction Disutrbances:none  Narrative Interpretation:   Old EKG Reviewed: none available and unchanged   1. Back spasm   2. Near syncope       MDM  Pt presenting with left sided back spasms since near syncopal episode on Monday 4/28.  Reports 2 other episodes of syncope and near syncope in 2008 and 2010. Had complete cardiac workup at [facility] last year: echocardiogram, ekg, and stress test that all came back normal.  Near syncopal episode described as split-second "blackout" but pt able to catch herself before fall.  Symptoms do no sound cardiac in nature.  EKG nl and unchanged.  Pt not experiencing any cardiac symptoms at this time.    Rx: robaxin and ibuprofen.  F/u with Dr. Reola Calkins, pcp, for back pain and continued "blackouts" May need further workup in future if they become more frequent or episodes are long in duration.  Provided education packet and return precautions.  Vitals: unremarkable. Discharged in  stable condition.    Discussed pt with Dr. Ranae Palms during ED encounter.         Junius Finner, PA-C 06/13/12 2052

## 2012-06-15 NOTE — ED Provider Notes (Signed)
Medical screening examination/treatment/procedure(s) were performed by non-physician practitioner and as supervising physician I was immediately available for consultation/collaboration.   Shaneika Rossa, MD 06/15/12 0751 

## 2012-08-10 ENCOUNTER — Other Ambulatory Visit: Payer: Self-pay

## 2012-08-10 DIAGNOSIS — Z1231 Encounter for screening mammogram for malignant neoplasm of breast: Secondary | ICD-10-CM

## 2012-08-23 ENCOUNTER — Other Ambulatory Visit: Payer: Self-pay | Admitting: Gynecology

## 2012-08-23 DIAGNOSIS — E65 Localized adiposity: Secondary | ICD-10-CM

## 2012-08-23 DIAGNOSIS — D259 Leiomyoma of uterus, unspecified: Secondary | ICD-10-CM

## 2012-08-23 DIAGNOSIS — R198 Other specified symptoms and signs involving the digestive system and abdomen: Secondary | ICD-10-CM

## 2012-08-30 ENCOUNTER — Ambulatory Visit (INDEPENDENT_AMBULATORY_CARE_PROVIDER_SITE_OTHER): Payer: BC Managed Care – PPO | Admitting: Gynecology

## 2012-08-30 ENCOUNTER — Encounter: Payer: Self-pay | Admitting: Gynecology

## 2012-08-30 ENCOUNTER — Encounter: Payer: BC Managed Care – PPO | Admitting: Gynecology

## 2012-08-30 VITALS — BP 134/90 | Ht 61.75 in | Wt 268.0 lb

## 2012-08-30 DIAGNOSIS — Z01419 Encounter for gynecological examination (general) (routine) without abnormal findings: Secondary | ICD-10-CM

## 2012-08-30 DIAGNOSIS — N83202 Unspecified ovarian cyst, left side: Secondary | ICD-10-CM | POA: Insufficient documentation

## 2012-08-30 DIAGNOSIS — Z113 Encounter for screening for infections with a predominantly sexual mode of transmission: Secondary | ICD-10-CM

## 2012-08-30 DIAGNOSIS — E663 Overweight: Secondary | ICD-10-CM

## 2012-08-30 NOTE — Progress Notes (Signed)
Mindy Collins 1970/03/25 [id number]   History:    42 y.o.  for annual gyn exam we will mean of continuing to increase in weight. Patient does not exercise and does not eat at home. The patient in January of this year had an abdominal myomectomy and left ovarian cystectomy with the following pathology report:  Diagnosis  1. Cyst, biopsy  - CONSISTENT WITH BENIGN SEROUS CYST.  2. Ovary, left  - BRENNER TUMOR.  - MALIGNANT FEATURES ARE NOT IDENTIFIED.  3. Uterine fibroid(s)  - LEIOMYOMA.  - MALIGNANT FEATURES ARE NOT IDENTIFIED.  Her PCP is Dr. Juliann Pulse Who has been following her for her hypertension. Patient would like to have an STD screen although she is not sexually active and is not having any vaginal discharge. Review her records indicated that in 1993 she had cryotherapy for cervical dysplasia at another facility. In 2005 she was treated for external genital warts. Her Pap smear in 2012 and 2030 were normal.patient reports normal menstrual cycles.   Past medical history,surgical history, family history and social history were all reviewed and documented in the EPIC chart.  Gynecologic History Patient's last menstrual period was 08/10/2012. Contraception: none Last Pap: 2013. Results were: normal Last mammogram: scheduled for next week. Results were: next week  Obstetric History OB History   Grav Para Term Preterm Abortions TAB SAB Ect Mult Living   0                ROS: A ROS was performed and pertinent positives and negatives are included in the history.  GENERAL: No fevers or chills. HEENT: No change in vision, no earache, sore throat or sinus congestion. NECK: No pain or stiffness. CARDIOVASCULAR: No chest pain or pressure. No palpitations. PULMONARY: No shortness of breath, cough or wheeze. GASTROINTESTINAL: No abdominal pain, nausea, vomiting or diarrhea, melena or bright red blood per rectum. GENITOURINARY: No urinary frequency, urgency, hesitancy or dysuria.  MUSCULOSKELETAL: No joint or muscle pain, no back pain, no recent trauma. DERMATOLOGIC: No rash, no itching, no lesions. ENDOCRINE: No polyuria, polydipsia, no heat or cold intolerance. No recent change in weight. HEMATOLOGICAL: No anemia or easy bruising or bleeding. NEUROLOGIC: No headache, seizures, numbness, tingling or weakness. PSYCHIATRIC: No depression, no loss of interest in normal activity or change in sleep pattern.     Exam: chaperone present  BP 134/90  Ht 5' 1.75" (1.568 m)  Wt 268 lb (121.564 kg)  BMI 49.44 kg/m2  LMP 08/10/2012  Body mass index is 49.44 kg/(m^2).  General appearance : Well developed well nourished female. No acute distress HEENT: Neck supple, trachea midline, no carotid bruits, no thyroidmegaly Lungs: Clear to auscultation, no rhonchi or wheezes, or rib retractions  Heart: Regular rate and rhythm, no murmurs or gallops Breast:Examined in sitting and supine position were symmetrical in appearance, no palpable masses or tenderness,  no skin retraction, no nipple inversion, no nipple discharge, no skin discoloration, no axillary or supraclavicular lymphadenopathy Abdomen: no palpable masses or tenderness, no rebound or guarding Extremities: no edema or skin discoloration or tenderness  Pelvic:  Bartholin, Urethra, Skene Glands: Within normal limits             Vagina: No gross lesions or discharge  Cervix: No gross lesions or discharge  Uterus  Limited exam due to patient's abdominal girth Adnexa limited exam due to patient's abdominal girth  Anus and perineum  normal   Rectovaginal  normal sphincter tone without palpated masses or tenderness  Hemoccult not indicated     Assessment/Plan:  42 y.o. female for annual exam will be referred to the general surgeon for consideration of bariatric surgery due to the patient's morbid obesity (BMI of 49.42). Lab work will be done by her primary physician. Patient will have her mammogram done next week.  She will return back to the office within 2 weeks for an ultrasound for better assessment of her uterus and ovary. She was reminded her monthly breast examinations. No Pap smear done today the new guidelines were discussed. STD screen obtained as follows: GC and Chlamydia culture, HIV, RPR and hepatitis B and C. Results pending at time of this dictation.    Ok Edwards MD, 1:41 PM 08/30/2012

## 2012-08-30 NOTE — Patient Instructions (Addendum)
Tetanus, Diphtheria, Pertussis (Tdap) Vaccine What You Need to Know WHY GET VACCINATED? Tetanus, diphtheria and pertussis can be very serious diseases, even for adolescents and adults. Tdap vaccine can protect us from these diseases. TETANUS (Lockjaw) causes painful muscle tightening and stiffness, usually all over the body.  It can lead to tightening of muscles in the head and neck so you can't open your mouth, swallow, or sometimes even breathe. Tetanus kills about 1 out of 5 people who are infected. DIPHTHERIA can cause a thick coating to form in the back of the throat.  It can lead to breathing problems, paralysis, heart failure, and death. PERTUSSIS (Whooping Cough) causes severe coughing spells, which can cause difficulty breathing, vomiting and disturbed sleep.  It can also lead to weight loss, incontinence, and rib fractures. Up to 2 in 100 adolescents and 5 in 100 adults with pertussis are hospitalized or have complications, which could include pneumonia and death. These diseases are caused by bacteria. Diphtheria and pertussis are spread from person to person through coughing or sneezing. Tetanus enters the body through cuts, scratches, or wounds. Before vaccines, the [place] saw as many as 200,000 cases a year of diphtheria and pertussis, and hundreds of cases of tetanus. Since vaccination began, tetanus and diphtheria have dropped by about 99% and pertussis by about 80%. TDAP VACCINE Tdap vaccine can protect adolescents and adults from tetanus, diphtheria, and pertussis. One dose of Tdap is routinely given at age 11 or 12. People who did not get Tdap at that age should get it as soon as possible. Tdap is especially important for health care professionals and anyone having close contact with a baby younger than 12 months. Pregnant women should get a dose of Tdap during every pregnancy, to protect the newborn from pertussis. Infants are most at risk for severe, life-threatening  complications from pertussis. A similar vaccine, called Td, protects from tetanus and diphtheria, but not pertussis. A Td booster should be given every 10 years. Tdap may be given as one of these boosters if you have not already gotten a dose. Tdap may also be given after a severe cut or burn to prevent tetanus infection. Your doctor can give you more information. Tdap may safely be given at the same time as other vaccines. SOME PEOPLE SHOULD NOT GET THIS VACCINE  If you ever had a life-threatening allergic reaction after a dose of any tetanus, diphtheria, or pertussis containing vaccine, OR if you have a severe allergy to any part of this vaccine, you should not get Tdap. Tell your doctor if you have any severe allergies.  If you had a coma, or long or multiple seizures within 7 days after a childhood dose of DTP or DTaP, you should not get Tdap, unless a cause other than the vaccine was found. You can still get Td.  Talk to your doctor if you:  have epilepsy or another nervous system problem,  had severe pain or swelling after any vaccine containing diphtheria, tetanus or pertussis,  ever had Guillain-Barr Syndrome (GBS),  aren't feeling well on the day the shot is scheduled. RISKS OF A VACCINE REACTION With any medicine, including vaccines, there is a chance of side effects. These are usually mild and go away on their own, but serious reactions are also possible. Brief fainting spells can follow a vaccination, leading to injuries from falling. Sitting or lying down for about 15 minutes can help prevent these. Tell your doctor if you feel dizzy or light-headed, or   have vision changes or ringing in the ears. Mild problems following Tdap (Did not interfere with activities)  Pain where the shot was given (about 3 in 4 adolescents or 2 in 3 adults)  Redness or swelling where the shot was given (about 1 person in 5)  Mild fever of at least 100.14F (up to about 1 in 25 adolescents or 1 in  100 adults)  Headache (about 3 or 4 people in 10)  Tiredness (about 1 person in 3 or 4)  Nausea, vomiting, diarrhea, stomach ache (up to 1 in 4 adolescents or 1 in 10 adults)  Chills, body aches, sore joints, rash, swollen glands (uncommon) Moderate problems following Tdap (Interfered with activities, but did not require medical attention)  Pain where the shot was given (about 1 in 5 adolescents or 1 in 100 adults)  Redness or swelling where the shot was given (up to about 1 in 16 adolescents or 1 in 25 adults)  Fever over 102F (about 1 in 100 adolescents or 1 in 250 adults)  Headache (about 3 in 20 adolescents or 1 in 10 adults)  Nausea, vomiting, diarrhea, stomach ache (up to 1 or 3 people in 100)  Swelling of the entire arm where the shot was given (up to about 3 in 100). Severe problems following Tdap (Unable to perform usual activities, required medical attention)  Swelling, severe pain, bleeding and redness in the arm where the shot was given (rare). A severe allergic reaction could occur after any vaccine (estimated less than 1 in a million doses). WHAT IF THERE IS A SERIOUS REACTION? What should I look for?  Look for anything that concerns you, such as signs of a severe allergic reaction, very high fever, or behavior changes. Signs of a severe allergic reaction can include hives, swelling of the face and throat, difficulty breathing, a fast heartbeat, dizziness, and weakness. These would start a few minutes to a few hours after the vaccination. What should I do?  If you think it is a severe allergic reaction or other emergency that can't wait, call 9-1-1 or get the person to the nearest hospital. Otherwise, call your doctor.  Afterward, the reaction should be reported to the "Vaccine Adverse Event Reporting System" (VAERS). Your doctor might file this report, or you can do it yourself through the VAERS web site at www.vaers.LAgents.no, or by calling 1-415-775-4564. VAERS is  only for reporting reactions. They do not give medical advice.  THE NATIONAL VACCINE INJURY COMPENSATION PROGRAM The National Vaccine Injury Compensation Program (VICP) is a federal program that was created to compensate people who may have been injured by certain vaccines. Persons who believe they may have been injured by a vaccine can learn about the program and about filing a claim by calling 1-(248)041-6166 or visiting the VICP website at SpiritualWord.at. HOW CAN I LEARN MORE?  Ask your doctor.  Call your local or state health department.  Contact the Centers for Disease Control and Prevention (CDC):  Call 4792256124 or visit CDC's website at PicCapture.uy. CDC Tdap Vaccine VIS (06/19/11) Document Released: 07/29/2011 Document Revised: 10/22/2011 Document Reviewed: 07/29/2011 [employer] Patient Information 2014 [place], [place]. Bariatric Surgery (Gastrointestinal Surgery for Severe Obesity) Severe obesity is a longstanding condition. It is difficult to treat through diet and exercise alone. Gastrointestinal surgery is the best option for people who are severely obese and cannot lose weight by traditional means, or who suffer from serious obesity-related health problems. The surgery promotes weight loss by decreasing the absorption of food  and, in some operations, interrupting the digestive process. As in other treatments for obesity, the best results are achieved with healthy eating behaviors and regular physical activity.  People who may consider gastrointestinal surgery include those with a body mass index (BMI) above 40. This is about 100 pounds of overweight for men and 80 pounds for women. People with a BMI between 35 and 40 and who suffer from type 2 diabetes or life-threatening cardiopulmonary (heart and lung) problems, such as severe sleep apnea or obesity-related heart disease, may also be candidates for surgery. (To use the Body Mass Index chart. find your  weight on the bottom of the graph. Go straight up from that point until you come to the line that matches your height. Then look to find your weight group). The idea of gastrointestinal surgery to control obesity grew out of results of operations for cancer or severe ulcers that removed large portions of the stomach or small intestine. Patients undergoing these procedures tended to lose weight after surgery. So some physicians began to use such operations to treat severe obesity. The first operation that was widely used for severe obesity was the intestinal bypass. This operation was first used 40 years ago. It produced weight loss by causing malabsorption. The idea was that patients could eat large amounts of food, which would be poorly digested or passed along too fast for the body to absorb many calories. The problem with this surgery was that it caused a loss of essential nutrients. Also, its side effects were unpredictable and sometimes fatal. The original form of the intestinal bypass operation is no longer used. THE NORMAL DIGESTIVE PROCESS Normally, as food moves along the digestive tract, digestive juices and enzymes digest and absorb calories and nutrients. After we chew and swallow our food, it moves down the esophagus to the stomach. There a strong acid continues the digestive process. The stomach can hold about 3 pints of food at one time. When the stomach contents move to the first portion of the small intestine (duodenum ), bile and pancreatic juice speed up digestion. Most of the iron and calcium in the foods we eat is absorbed in the duodenum. The jejunum and ileum are the remaining two segments of the nearly 20 feet of small intestine. They complete the absorption of almost all calories and nutrients. The food particles that cannot be digested in the small intestine are stored in the large intestine until eliminated.  HOW DOES SURGERY PROMOTE WEIGHT LOSS? Gastrointestinal surgery for obesity  is also called bariatric surgery. It alters the digestive process. The operations promote weight loss by closing off parts of the stomach. This will make it smaller. Operations that only reduce stomach size are known as "restrictive operations". They restrict the amount of food the stomach can hold. Some operations combine stomach restriction with a partial bypass of the small intestine. These procedures create a direct connection from the stomach to the lower segment of the small intestine. This causes bypassing portions of the digestive tract that absorb calories and nutrients. These are known as malabsorptive operations. WHAT ARE THE SURGICAL OPTIONS? There are several types of restrictive and malabsorptive operations. Each one carries its own benefits and risks.  Restrictive Operations  Restrictive operations serve only to restrict food intake. They do not interfere with the normal digestive process. To perform the surgery, doctors create a small pouch at the top of the stomach where food enters from the esophagus. At first, the pouch holds about 1 ounce  of food. It later expands to 2-3 ounces. The lower outlet of the pouch usually has a diameter of only about  inch. This small outlet delays the emptying of food from the pouch and causes a feeling of fullness. As a result of this surgery, most people lose the ability to eat large amounts of food at one time. After an operation, the person usually can eat only  to 1 cup of food without discomfort or nausea. Also, food has to be well chewed. Restrictive operations for obesity include adjustable gastric banding (AGB) and vertical banded gastroplasty (VBG).  Adjustable gastric banding  In this procedure, a hollow band made of special material is placed around the stomach near its upper end. This creates a small pouch and a narrow passage into the larger remainder of the stomach. The band is then inflated with a salt solution. It can be tightened or  loosened over time to change the size of the passage by increasing or decreasing the amount of salt solution.  The band is adjusted based on feelings of hunger and weight loss. Patients decide when they need an adjustment and come to their surgeons to evaluate this. The adjustment is done as an office visit. The band is fully reversible with a second surgery if the patient changes his/her mind. There is no cutting or re-routing of the intestine.  Vertical banded gastroplasty  VBG has been the most common restrictive operation for weight control. Both a band and staples are used to create a small stomach pouch. Vertical banded gastroplasty is based on the same principle of restriction as the band. But the stomach is surgically altered with the stapling. This treatment is not reversible.  Restrictive operations lead to weight loss in almost all patients. But they are less successful than malabsorptive operations in achieving substantial, long-term weight loss. About 30 percent of those who undergo VBG achieve normal weight. About 80 percent achieve some degree of weight loss. Some patients regain weight. Others are unable to adjust their eating habits and fail to lose the desired weight. Successful results depend on the patient's willingness to adopt a long-term plan of healthy eating and regular physical activity.  A common risk of restrictive operations is vomiting. This is caused when the small stomach is overly stretched by food particles that have not been chewed well. Band slippage and saline leakage have been reported after AGB. Risks of VBG include wearing away of the band and breakdown of the staple line. In a small number of cases, stomach juices may leak into the abdomen. This requires an emergency operation. In less than 1 percent of all cases, infection or death from complications may occur. Malabsorptive Operations  Malabsorptive operations are the most common gastrointestinal surgeries for  weight loss. They restrict both food intake and the amount of calories and nutrients the body absorbs.  Roux-en-Y gastric bypass (RGB)  This operation is the most common and successful malabsorptive surgery. First, a small stomach pouch is created to restrict food intake. Next, a Y-shaped section of the small intestine is attached to the pouch. This allows food to bypass the lower stomach, the first segment of the small intestine (duodenum), and the first portion of the jejunum (the second segment of the small intestine). This bypass reduces the amount of calories and nutrients the body absorbs.  Biliopancreatic diversion (BPD)  In this more complicated malabsorptive operation, portions of the stomach are removed. The small pouch that remains is connected directly to the final  segment of the small intestine, completely bypassing the duodenum and the jejunum. This procedure successfully promotes weight loss. But it is less frequently used than other types of surgery because of the high risk for nutritional deficiencies. A variation of BPD includes a "duodenal switch". This leaves a larger portion of the stomach intact, including the pyloric valve. This valve regulates the release of stomach contents into the small intestine. It also keeps a small part of the duodenum in the digestive pathway.  Malabsorptive operations produce more weight loss than restrictive operations. And they are more effective in reversing the health problems associated with severe obesity. Patients who have malabsorptive operations generally lose two-thirds of their excess weight within 2 years.  In addition to the risks of restrictive surgeries, malabsorptive operations also carry greater risk for nutritional deficiencies. This is because the procedure causes food to bypass the duodenum and jejunum. That is where most iron and calcium are absorbed. Menstruating women may develop anemia because not enough vitamin B12 and iron are  absorbed. Decreased absorption of calcium may also bring on osteoporosis and metabolic bone disease. Patients are required to take nutritional supplements that usually prevent these deficiencies. Patients who have the biliopancreatic diversion surgery must also take fat-soluble (dissolved by fat) vitamins A, D, E, and K supplements.  RGB and BPD operations may also cause "dumping syndrome". This means that stomach contents move too rapidly through the small intestine. Symptoms include nausea, weakness, sweating, faintness, and sometimes diarrhea after eating. The duodenal switch operation keeps the pyloric valve intact. So it may reduce the likelihood of dumping syndrome.  The more extensive the bypass, the greater the risk is for complications and nutritional deficiencies. Patients with extensive bypasses of the normal digestive process require close monitoring. They also need life-long use of special foods, supplements, and medications. EXPLORE BENEFITS AND RISKS Surgery to produce weight loss is a serious undertaking. Anyone thinking about surgery should understand what the operation involves. Patients and physicians should carefully consider the following benefits and risks.  Benefits  Right after surgery, most patients lose weight quickly. They continue to lose for 18 to 24 months after the procedure. Most patients regain 5 to 10 percent of the weight they lost. But many maintain a long-term weight loss of about 100 pounds.  Surgery improves most obesity-related conditions. For example, in one study blood sugar levels of 83 percent of obese patients with diabetes returned to normal after surgery. Nearly all patients whose blood sugar levels did not return to normal were older. Or they had lived with diabetes for a long time. Risks  Ten to 20 percent of patients who have weight-loss surgery require follow-up operations to correct complications. Abdominal hernia was the most common complication  requiring follow-up surgery. But laparoscopic techniques seem to have solved this problem. In laparoscopy, the surgeon makes one or more small incisions. Slender surgical instruments are passed them. This technique eliminates the need for a large incision. And it creates less tissue damage. Patients who are super obese (greater than 350 pounds) or have had previous abdominal surgery, may not be good candidates for laparoscopy. Less common complications include breakdown of the staple line and stretched stomach outlets.  Some obese patients who have weight-loss surgery develop gallstones. These are clumps of cholesterol and other matter that form in the gallbladder. During quick or substantial weight loss, one's risk of developing gallstones increases. Taking supplemental bile salts for the first 6 months after surgery can prevent them.  Nearly 30  percent of patients who have weight-loss surgery develop nutritional deficiencies. These include anemia, osteoporosis, and metabolic bone disease. These usually can be avoided if vitamin and mineral intakes are high enough.  Women of childbearing age should avoid pregnancy until their weight becomes stable. Quick weight loss and nutritional deficiencies can harm a growing fetus.  Other risks of restrictive surgeries include:  Band slippage.  Stomach prolapse.  Band erosion into the lumen of the stomach.  Port infection.  The main risk with malabsorption operations is life threatening. It is the risk of leak from any of the anastomosis. The more involved the operation, the more risk involved.  There is one other risk of having the surgery. If people do not follow a strict diet, they will stretch out their stomach pouches. Then they will not lose weight. MEDICAL COSTS Gastrointestinal surgery costs vary. They depend on the procedure. Medical insurance coverage varies by state and insurance provider. If you are considering gastrointestinal surgery,  contact your r egional Medicare or Medicaid office or your insurance plan. Find out from them if the procedure is covered. IS THE SURGERY FOR YOU?  Gastrointestinal surgery may be the next step for people who remain severely obese after trying nonsurgical approaches or have an obesity-related disease. Candidates for surgery have:  A BMI of 40 or more.  A BMI of 35 or more and a life-threatening obesity-related health problem such as:  Diabetes.  Severe sleep apnea.  Heart disease.  Obesity-related physical problems that interfere with:  Employment.  Walking.  Family function. If you fit the profile for surgery, answers to these questions may help you decide whether weight-loss surgery is appropriate for you. Are you:  Unlikely to lose weight successfully without surgery?  Well informed about the surgical procedure? The effects of treatment?  Determined to lose weight? Improve your health?  Aware of how your life may change after the operation? Adjustment to the side effects of the surgery include the need to chew well and being unable to eat large meals.  Aware of the potential for serious complications? Dietary restrictions? Occasional failures?  Committed to lifelong medical follow-up?  Restrictive operations are very successful with patients who follow a diet created by a dietician. Support groups and follow up with caregivers is important. Remember: There are no guarantees for any method to produce and maintain weight loss. This includes surgery. Success is possible only with:  Maximum cooperation.  Commitment to behavioral change.  Medical follow-up. This cooperation and commitment must be carried out for the rest of your life.  ADDITIONAL RESOURCES [employer]ariat[employer] [address] [place], [place] [address] www.asmbs.org  Weight-control Information Network (WIN) [address] [place]  [id number] FindSpin.nl Document Released: 01/27/2005 Document Revised: 04/21/2011 Document Reviewed: 04/22/2006 [facility] Patient Information 2014 [place], [place].

## 2012-08-31 LAB — GC/CHLAMYDIA PROBE AMP
CT Probe RNA: NEGATIVE
GC Probe RNA: NEGATIVE

## 2012-08-31 LAB — HEPATITIS B SURFACE ANTIGEN: Hepatitis B Surface Ag: NEGATIVE

## 2012-08-31 LAB — HEPATITIS C ANTIBODY: HCV Ab: NEGATIVE

## 2012-08-31 LAB — RPR

## 2012-08-31 LAB — HIV ANTIBODY (ROUTINE TESTING W REFLEX): HIV: NONREACTIVE

## 2012-09-06 ENCOUNTER — Ambulatory Visit: Payer: BC Managed Care – PPO | Admitting: Gynecology

## 2012-09-06 ENCOUNTER — Other Ambulatory Visit: Payer: Self-pay | Admitting: Gynecology

## 2012-09-06 ENCOUNTER — Ambulatory Visit
Admission: RE | Admit: 2012-09-06 | Discharge: 2012-09-06 | Disposition: A | Discharge status: Home / Self Care | Payer: BC Managed Care – PPO | Source: Ambulatory Visit

## 2012-09-06 ENCOUNTER — Ambulatory Visit (INDEPENDENT_AMBULATORY_CARE_PROVIDER_SITE_OTHER): Payer: BC Managed Care – PPO | Admitting: Gynecology

## 2012-09-06 ENCOUNTER — Other Ambulatory Visit: Payer: BC Managed Care – PPO

## 2012-09-06 ENCOUNTER — Ambulatory Visit (INDEPENDENT_AMBULATORY_CARE_PROVIDER_SITE_OTHER): Payer: BC Managed Care – PPO

## 2012-09-06 DIAGNOSIS — D251 Intramural leiomyoma of uterus: Secondary | ICD-10-CM

## 2012-09-06 DIAGNOSIS — E65 Localized adiposity: Secondary | ICD-10-CM

## 2012-09-06 DIAGNOSIS — Z1231 Encounter for screening mammogram for malignant neoplasm of breast: Secondary | ICD-10-CM

## 2012-09-06 DIAGNOSIS — R198 Other specified symptoms and signs involving the digestive system and abdomen: Secondary | ICD-10-CM

## 2012-09-06 DIAGNOSIS — N852 Hypertrophy of uterus: Secondary | ICD-10-CM

## 2012-09-06 DIAGNOSIS — L919 Hypertrophic disorder of the skin, unspecified: Secondary | ICD-10-CM

## 2012-09-06 DIAGNOSIS — D259 Leiomyoma of uterus, unspecified: Secondary | ICD-10-CM

## 2012-09-06 DIAGNOSIS — R1900 Intra-abdominal and pelvic swelling, mass and lump, unspecified site: Secondary | ICD-10-CM

## 2012-09-06 DIAGNOSIS — N83209 Unspecified ovarian cyst, unspecified side: Secondary | ICD-10-CM

## 2012-09-06 DIAGNOSIS — N839 Noninflammatory disorder of ovary, fallopian tube and broad ligament, unspecified: Secondary | ICD-10-CM

## 2012-09-06 DIAGNOSIS — L909 Atrophic disorder of skin, unspecified: Secondary | ICD-10-CM

## 2012-09-06 NOTE — Progress Notes (Signed)
Patient presented to the office today for ultrasound. Patient in January this year had an abdominal myomectomy and left ovarian cystectomy with the following pathology report:  Diagnosis  1. Cyst, biopsy  - CONSISTENT WITH BENIGN SEROUS CYST.  2. Ovary, left  - BRENNER TUMOR.  - MALIGNANT FEATURES ARE NOT IDENTIFIED.  3. Uterine fibroid(s)  - LEIOMYOMA.  - MALIGNANT FEATURES ARE NOT IDENTIFIED.  Due to patient's abdominal girth an ultrasound was ordered today this is the reason for today's visit. She had requested a full STD screen consistent GC and Chlamydia culture, hepatitis B, hepatitis C, HIV and RPR at last visit results were all negative.  Ultrasound: Uterus measured 10 x 6.7 x 5.7 mm with 7.7 mm endometrial stripe. Patient with 2 small fibroids the largest measuring 19 x 20 mm ports were intramural. A small right avascular cyst measures 17 by total knee was noted and the left ovary had a small avascular cyst low-level echo measuring 12 mm.  Assessment/plan: 2 small follicle cysts on both ovaries benign-appearing. Patient is doing well otherwise. We'll continue to follow with ultrasounds yearly because of the patient's abdominal girth and  history of ovarian cyst.

## 2012-09-07 ENCOUNTER — Ambulatory Visit (INDEPENDENT_AMBULATORY_CARE_PROVIDER_SITE_OTHER): Payer: BC Managed Care – PPO | Admitting: Anesthesiology

## 2012-09-07 DIAGNOSIS — Z23 Encounter for immunization: Secondary | ICD-10-CM

## 2012-09-08 ENCOUNTER — Other Ambulatory Visit: Payer: Self-pay | Admitting: Gynecology

## 2012-09-08 DIAGNOSIS — R928 Other abnormal and inconclusive findings on diagnostic imaging of breast: Secondary | ICD-10-CM

## 2012-09-16 ENCOUNTER — Ambulatory Visit
Admission: RE | Admit: 2012-09-16 | Discharge: 2012-09-16 | Disposition: A | Discharge status: Home / Self Care | Payer: BC Managed Care – PPO | Source: Ambulatory Visit | Attending: Gynecology | Admitting: Gynecology

## 2012-09-16 DIAGNOSIS — R928 Other abnormal and inconclusive findings on diagnostic imaging of breast: Secondary | ICD-10-CM

## 2012-12-16 ENCOUNTER — Other Ambulatory Visit: Payer: Self-pay

## 2013-03-09 ENCOUNTER — Other Ambulatory Visit: Payer: Self-pay | Admitting: Gynecology

## 2013-03-09 DIAGNOSIS — N63 Unspecified lump in unspecified breast: Secondary | ICD-10-CM

## 2013-03-28 ENCOUNTER — Ambulatory Visit
Admission: RE | Admit: 2013-03-28 | Discharge: 2013-03-28 | Disposition: A | Discharge status: Home / Self Care | Payer: BC Managed Care – PPO | Source: Ambulatory Visit | Attending: Gynecology | Admitting: Gynecology

## 2013-03-28 DIAGNOSIS — N63 Unspecified lump in unspecified breast: Secondary | ICD-10-CM

## 2013-08-03 ENCOUNTER — Ambulatory Visit (INDEPENDENT_AMBULATORY_CARE_PROVIDER_SITE_OTHER): Payer: BC Managed Care – PPO | Admitting: Gynecology

## 2013-08-03 ENCOUNTER — Encounter: Payer: Self-pay | Admitting: Gynecology

## 2013-08-03 ENCOUNTER — Other Ambulatory Visit: Payer: Self-pay | Admitting: Gynecology

## 2013-08-03 ENCOUNTER — Ambulatory Visit (INDEPENDENT_AMBULATORY_CARE_PROVIDER_SITE_OTHER): Payer: BC Managed Care – PPO

## 2013-08-03 VITALS — BP 124/84

## 2013-08-03 DIAGNOSIS — N912 Amenorrhea, unspecified: Secondary | ICD-10-CM

## 2013-08-03 DIAGNOSIS — L909 Atrophic disorder of skin, unspecified: Secondary | ICD-10-CM

## 2013-08-03 DIAGNOSIS — E65 Localized adiposity: Secondary | ICD-10-CM

## 2013-08-03 DIAGNOSIS — D251 Intramural leiomyoma of uterus: Secondary | ICD-10-CM

## 2013-08-03 DIAGNOSIS — M545 Low back pain, unspecified: Secondary | ICD-10-CM

## 2013-08-03 DIAGNOSIS — D259 Leiomyoma of uterus, unspecified: Secondary | ICD-10-CM

## 2013-08-03 DIAGNOSIS — L919 Hypertrophic disorder of the skin, unspecified: Secondary | ICD-10-CM

## 2013-08-03 LAB — URINALYSIS W MICROSCOPIC + REFLEX CULTURE
Bilirubin Urine: NEGATIVE
Glucose, UA: NEGATIVE mg/dL
Hgb urine dipstick: NEGATIVE
Ketones, ur: NEGATIVE mg/dL
Leukocytes, UA: NEGATIVE
Nitrite: NEGATIVE
Specific Gravity, Urine: 1.025 (ref 1.005–1.030)
Urobilinogen, UA: 0.2 mg/dL (ref 0.0–1.0)
pH: 5.5 (ref 5.0–8.0)

## 2013-08-03 LAB — TSH: TSH: 2.248 u[IU]/mL (ref 0.350–4.500)

## 2013-08-03 LAB — PREGNANCY, URINE: Preg Test, Ur: NEGATIVE

## 2013-08-03 LAB — PROLACTIN: Prolactin: 8.7 ng/mL

## 2013-08-03 MED ORDER — MEDROXYPROGESTERONE ACETATE 10 MG PO TABS: ORAL_TABLET | ORAL | Status: DC

## 2013-08-03 MED ORDER — MEDROXYPROGESTERONE ACETATE 10 MG PO TABS: 10.0000 mg | ORAL_TABLET | Freq: Every day | ORAL | Status: DC

## 2013-08-03 NOTE — Patient Instructions (Signed)
Secondary Amenorrhea  Secondary amenorrhea is the stopping of menstrual flow for 3-6 months in a female who has previously had periods. There are many possible causes. Most of these causes are not serious. Usually, treating the underlying problem causing the loss of menses will return your periods to normal. CAUSES  Some common and uncommon causes of not menstruating include:  Malnutrition.  Low blood sugar (hypoglycemia).  Polycystic ovary disease.  Stress or fear.  Breastfeeding.  Hormone imbalance.  Ovarian failure.  Medicines.  Extreme obesity.  Cystic fibrosis.  Low body weight or drastic weight reduction from any cause.  Early menopause.  Removal of ovaries or uterus.  Contraceptives.  Illness.  Long-term (chronic) illnesses.  Cushing syndrome.  Thyroid problems.  Birth control pills, patches, or vaginal rings for birth control. RISK FACTORS You may be at greater risk of secondary amenorrhea if:  You have a family history of this condition.  You have an eating disorder.  You do athletic training. DIAGNOSIS  A diagnosis is made by your health care provider taking a medical history and doing a physical exam. This will include a pelvic exam to check for problems with your reproductive organs. Pregnancy must be ruled out. Often, numerous blood tests are done to measure different hormones in the body. Urine testing may be done. Specialized exams (ultrasound, CT scan, MRI, or hysteroscopy) may have to be done as well as measuring the body mass index (BMI). TREATMENT  Treatment depends on the cause of the amenorrhea. If an eating disorder is present, this can be treated with an adequate diet and therapy. Chronic illnesses may improve with treatment of the illness. Amenorrhea may be corrected with medicines, lifestyle changes, or surgery. If the amenorrhea cannot be corrected, it is sometimes possible to create a false menstruation with medicines. HOME CARE  INSTRUCTIONS  Maintain a healthy diet.  Manage weight problems.  Exercise regularly but not excessively.  Get adequate sleep.  Manage stress.  Be aware of changes in your menstrual cycle. Keep a record of when your periods occur. Note the date your period starts, how long it lasts, and any problems. SEEK MEDICAL CARE IF: Your symptoms do not get better with treatment. Document Released: 03/10/2006 Document Revised: 09/29/2012 Document Reviewed: 07/15/2012 [facility] Patient Information 2015 [place], [place]. This information is not intended to replace advice given to you by your health care provider. Make sure you discuss any questions you have with your health care provider. Medroxyprogesterone tablets What is this medicine? MEDROXYPROGESTERONE (me DROX ee proe JES te rone) is a hormone in a class called progestins. It is commonly used to prevent the uterine lining from overgrowth in women taking an estrogen after menopause. It is also used to treat irregular menstrual bleeding or a lack of menstrual bleeding in women. This medicine may be used for other purposes; ask your health care provider or pharmacist if you have questions. COMMON BRAND NAME(S): Amen, Provera What should I tell my health care provider before I take this medicine? They need to know if you have any of these conditions: -blood vessel disease or a history of a blood clot in the lungs or legs -breast, cervical or vaginal cancer -heart disease -kidney disease -liver disease -migraine -recent miscarriage or abortion -mental depression -migraine -seizures (convulsions) -stroke -vaginal bleeding that has not been evaluated -an unusual or allergic reaction to medroxyprogesterone, other medicines, foods, dyes, or preservatives -pregnant or trying to get pregnant -breast-feeding How should I use this medicine? Take this medicine  by mouth with a glass of water. Follow the directions on the prescription label. Take your  doses at regular intervals. Do not take your medicine more often than directed. Talk to your pediatrician regarding the use of this medicine in children. Special care may be needed. While this drug may be prescribed for children as young as 13 years for selected conditions, precautions do apply. Overdosage: If you think you have taken too much of this medicine contact a poison control center or emergency room at once. NOTE: This medicine is only for you. Do not share this medicine with others. What if I miss a dose? If you miss a dose, take it as soon as you can. If it is almost time for your next dose, take only that dose. Do not take double or extra doses. What may interact with this medicine? -barbiturate medicines for inducing sleep or treating seizures (convulsions) -bosentan -carbamazepine -phenytoin -rifampin -St. John's Wort This list may not describe all possible interactions. Give your health care provider a list of all the medicines, herbs, non-prescription drugs, or dietary supplements you use. Also tell them if you smoke, drink alcohol, or use illegal drugs. Some items may interact with your medicine. What should I watch for while using this medicine? Visit your health care professional for regular checks on your progress. You will need a regular breast and pelvic exam. If you have any reason to think you are pregnant, stop taking this medicine at once and contact your doctor or health care professional. What side effects may I notice from receiving this medicine? Side effects that you should report to your doctor or health care professional as soon as possible: -breast tenderness or discharge -changes in mood or emotions, such as depression -changes in vision or speech -pain in the abdomen, chest, groin, or leg -severe headache -skin rash, itching, or hives -sudden shortness of breath -unusually weak or tired -yellowing of skin or eyes Side effects that usually do not  require medical attention (report to your doctor or health care professional if they continue or are bothersome): -acne -change in menstrual bleeding pattern or flow -changes in sexual desire -facial hair growth -fluid retention and swelling -headache -upset stomach -weight gain or loss This list may not describe all possible side effects. Call your doctor for medical advice about side effects. You may report side effects to FDA at 1-800-FDA-1088. Where should I keep my medicine? Keep out of the reach of children. Store at room temperature between 20 and 25 degrees C (68 and 77 degrees F). Throw away any unused medicine after the expiration date. NOTE: This sheet is a summary. It may not cover all possible information. If you have questions about this medicine, talk to your doctor, pharmacist, or health care provider.  2015, Elsevier/Gold Standard. (2008-01-27 11:26:12)

## 2013-08-03 NOTE — Progress Notes (Signed)
   Patient presented to the office today stating that she has not had a menstrual cycle since 06/08/2013. Patient in 2013 had an abdominal myomectomy and left ovarian cystectomy. 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 Patient is not sexually active. She is overweight. She denies any nausea, vomiting, or any GU or GI complaints. She has been complaining of left sided and back discomfort on and off for several weeks. She suffers occasionally for constipation. She is not sexually active. She denies any galactorrhea or visual disturbances or any unusual headaches.  Urinalysis today was negative Urine pregnancy today was negative  Abdomen: Pendulous soft nontender no rebound or guarding Back: No CVA tenderness Pelvic: Urethra Skene was within normal limits Vagina: No lesions or discharge Cervix: No lesions or discharge Uterus: Limited due to abdominal girth Adnexa: Limited due to abdominal girth Rectal exam: Not done  Due to limitations of pelvic exam because of patient's weight and increased abdominal girth and the left-sided lower quadrant discomfort we are going to do an ultrasound today which demonstrated the following:  Uterus measuring 9.7 x 5.3 x 4.9 cm. 2 small fibroids the largest one measuring 31 x 20 mm was noted. Normal endometrial cavity. Right ovary not seen. No apparent masses on either adnexa. Left ovary appears to be normal. Minimal fluid in the cul-de-sac.  Assessment/plan: Secondary amenorrhea probably attributed to anovulation due to her obesity. We are going to check a TSH and prolactin today. She will be prescribed Provera 10 mg to take 1 by mouth daily for 10 days to withdrawal. I've given her additional prescriptions in the event that she does not have a spontaneous cycles every 30 days she will repeat the same sequence. If she does become sexually active I explained to her that she will need to do a urine pregnancy test before she takes the Provera in the future if she  does not demonstrate spontaneously and needs the Provera. We'll notify her of there is any abnormality in any of the above tests. She will continue with her weight watcher program in an effort to lose weight. Her left flank inside discomfort may be attributed to her recent exercising and weight.

## 2013-08-04 ENCOUNTER — Telehealth: Payer: Self-pay | Admitting: *Deleted

## 2013-08-04 NOTE — Telephone Encounter (Signed)
Pt rx from [place] from [place] 08/04/13 was already called into pharmacy.

## 2013-08-21 ENCOUNTER — Ambulatory Visit (INDEPENDENT_AMBULATORY_CARE_PROVIDER_SITE_OTHER): Payer: BC Managed Care – PPO | Admitting: Emergency Medicine

## 2013-08-21 VITALS — BP 122/88 | HR 88 | Temp 98.1°F | Resp 20 | Ht 62.25 in | Wt 267.4 lb

## 2013-08-21 DIAGNOSIS — R079 Chest pain, unspecified: Secondary | ICD-10-CM

## 2013-08-21 DIAGNOSIS — S46911A Strain of unspecified muscle, fascia and tendon at shoulder and upper arm level, right arm, initial encounter: Secondary | ICD-10-CM

## 2013-08-21 DIAGNOSIS — IMO0002 Reserved for concepts with insufficient information to code with codable children: Secondary | ICD-10-CM

## 2013-08-21 MED ORDER — CYCLOBENZAPRINE HCL 10 MG PO TABS: 10.0000 mg | ORAL_TABLET | Freq: Three times a day (TID) | ORAL | Status: DC | PRN

## 2013-08-21 MED ORDER — NAPROXEN SODIUM 550 MG PO TABS: 550.0000 mg | ORAL_TABLET | Freq: Two times a day (BID) | ORAL | Status: DC

## 2013-08-21 NOTE — Patient Instructions (Signed)

## 2013-08-21 NOTE — Progress Notes (Signed)
[facility] [address] [place] [address] [phone]  Date:  08/21/2013   Name:  Mindy Collins   DOB:  1970-11-17   MRN:  [id number]  PCP:  No primary provider on file.    Chief Complaint: Chest Pain, Jaw Pain and Arm Pain   History of Present Illness:  Mindy Collins is a 43 y.o. very pleasant female patient who presents with the following:  Patient presents with a history of left chest pain that was sharp in nature a week ago that lasted a couple days.  Pain waxed and waned and lasted only seconds.  Now she has a 2 day history of right shoulder, neck and arm pain.  Says this is a dull heavy pain.  Not associated with activity or posture.  Lasts minutes.  Some "soreness" in right base of neck.  No shortness of breath, rapid or irregular pulse, nausea or vomiting, diaphoresis.  Irregular menses.  Non smoker.  No DM or HLD.  On med for hypertension.  Sedentary life. GM and GGM both had demise due heart attack prior to 52 but were heavy smokers.  Had similar pain a month ago.  No improvement with over the counter medications or other home remedies. Denies other complaint or health concern today.   Patient Active Problem List   Diagnosis Date Noted  . Morbid obesity 08/21/2013  . Ovarian cyst, left 08/30/2012  . Sebaceous cyst of labia 04/02/2012  . IBS (irritable bowel syndrome) 09/30/2011  . CHEST PAIN UNSPECIFIED 04/05/2010  . HYPERTENSION, BENIGN 04/05/2010    Past Medical History  Diagnosis Date  . HPV (human papilloma virus) infection     WARTS/ TCA [place]  . Dysmenorrhea   . Hypertension   . Obesity   . GERD (gastroesophageal reflux disease)   . Seasonal allergies   . Anemia   . Fatty liver   . Colitis     Past Surgical History  Procedure Laterality Date  . Gynecologic cryosurgery  1993    DYSPLASIA CERVIX/   . Myomectomy abdominal approach  04/31/2007  . Myomectomy  02/17/2011    Procedure: MYOMECTOMY;  Surgeon: Terrance Mass, MD;   Location: [facility];  Service: Gynecology;  Laterality: N/A;  I am hoping to get a 7:30am time on Dec 4, Dec 11 or Dec 12. It not available I will check on some 1:00 pm times. Thanks  . Ovarian cyst removal  02/17/2011    Procedure: OVARIAN CYSTECTOMY;  Surgeon: Terrance Mass, MD;  Location: [facility];  Service: Gynecology;  Laterality: Left;    History  Substance Use Topics  . Smoking status: Never Smoker   . Smokeless tobacco: Never Used     Comment: denies tobacco   . Alcohol Use: No    Family History  Problem Relation Age of Onset  . Hypertension Mother   . Heart disease Maternal Grandmother   . Cancer Father     LUNG  . Breast cancer Maternal Aunt   . Diabetes Maternal Grandfather     Allergies  Allergen Reactions  . Esomeprazole Magnesium Shortness Of Breath    Nervousness; tolerates Aciphex.  . Adhesive [Tape] Itching and Rash    Itching and rash with adhesive tape  . Latex Itching and Dermatitis    Skin blistering    Medication list has been reviewed and updated.  Current Outpatient Prescriptions on File Prior to Visit  Medication Sig Dispense Refill  . loratadine (CLARITIN)  10 MG tablet Take 10 mg by mouth daily.      . Multiple Vitamin (MULTIVITAMIN) tablet Take 1 tablet by mouth daily.      Marland Kitchen olmesartan-hydrochlorothiazide (BENICAR HCT) 20-12.5 MG per tablet Take 1 tablet by mouth daily.      . fluticasone (FLONASE) 50 MCG/ACT nasal spray Place into both nostrils daily.       No current facility-administered medications on file prior to visit.    Review of Systems:  As per HPI, otherwise negative.    Physical Examination: Filed Vitals:   08/21/13 1245  BP: 122/88  Pulse: 88  Temp: 98.1 F (36.7 C)  Resp: 20   Filed Vitals:   08/21/13 1245  Height: 5' 2.25" (1.581 m)  Weight: 267 lb 6 oz (121.281 kg)   Body mass index is 48.52 kg/(m^2). Ideal Body Weight: Weight in (lb) to have BMI = 25: 137.5  GEN: morbidly obese, NAD, Non-toxic, A & O x  3 HEENT: Atraumatic, Normocephalic. Neck supple. No masses, No LAD. Ears and Nose: No external deformity. CV: RRR, No M/G/R. No JVD. No thrill. No extra heart sounds. PULM: CTA B, no wheezes, crackles, rhonchi. No retractions. No resp. distress. No accessory muscle use. ABD: S, NT, ND, +BS. No rebound. No HSM. EXTR: No c/c/e NEURO Normal gait.  PSYCH: Normally interactive. Conversant. Not depressed or anxious appearing.  Calm demeanor.  Tender right posterior shoulder  Assessment and Plan: Right shoulder pain. Anaprox Flexeril Patient had a previous stress echo that she says was normal.   Signed,  Ellison Carwin, MD

## 2013-08-22 ENCOUNTER — Telehealth: Payer: Self-pay | Admitting: *Deleted

## 2013-08-22 NOTE — Telephone Encounter (Signed)
Pt informed could take up to 14 days from last pill for cycle to start. Pt will wait and follow up with annual on 08/31/13

## 2013-08-22 NOTE — Telephone Encounter (Signed)
Message copied by Thamas Jaegers on Mon Aug 22, 2013 10:58 AM ------      Message from: Vickii Chafe R      Created: Mon Aug 22, 2013  8:50 AM      Contact: [phone]       Mindy Collins-THis pt called today stating that she still has not started her period after taking the provera JF gave her. She is on day 9 after finish      The Provera. She had slight spotting day 2-3 while taking the meds but that is all. No active bleeding or cycle.      She has her CE next Weds and didn't know if she needs to be seen before then in regards to this. Thanks for checking with him on this.      Please let patient know.       ------

## 2013-08-31 ENCOUNTER — Encounter: Payer: BC Managed Care – PPO | Admitting: Gynecology

## 2013-09-05 ENCOUNTER — Other Ambulatory Visit: Payer: Self-pay | Admitting: Gynecology

## 2013-09-05 DIAGNOSIS — N6489 Other specified disorders of breast: Secondary | ICD-10-CM

## 2013-09-07 ENCOUNTER — Ambulatory Visit
Admission: RE | Admit: 2013-09-07 | Discharge: 2013-09-07 | Disposition: A | Discharge status: Home / Self Care | Payer: BC Managed Care – PPO | Source: Ambulatory Visit | Attending: Gynecology | Admitting: Gynecology

## 2013-09-07 DIAGNOSIS — N6489 Other specified disorders of breast: Secondary | ICD-10-CM

## 2013-09-22 ENCOUNTER — Ambulatory Visit (INDEPENDENT_AMBULATORY_CARE_PROVIDER_SITE_OTHER): Payer: BC Managed Care – PPO | Admitting: Gynecology

## 2013-09-22 ENCOUNTER — Encounter: Payer: Self-pay | Admitting: Gynecology

## 2013-09-22 VITALS — BP 134/84 | Ht 62.0 in | Wt 271.0 lb

## 2013-09-22 DIAGNOSIS — Z113 Encounter for screening for infections with a predominantly sexual mode of transmission: Secondary | ICD-10-CM

## 2013-09-22 DIAGNOSIS — N915 Oligomenorrhea, unspecified: Secondary | ICD-10-CM

## 2013-09-22 DIAGNOSIS — Z01419 Encounter for gynecological examination (general) (routine) without abnormal findings: Secondary | ICD-10-CM

## 2013-09-22 DIAGNOSIS — E663 Overweight: Secondary | ICD-10-CM

## 2013-09-22 NOTE — Patient Instructions (Signed)
Take provera 10 mg tablet one daily for 10 days each month IF you do not have a spontaneous menses every 30 days

## 2013-09-22 NOTE — Progress Notes (Signed)
PASTY MANNINEN 1970-06-03 [id number]   History:    43 y.o.  for annual gyn exam with no complaint except for requesting to have STD blood screen. Patient continues to gain weight and this has indirectly contributed to her anovulation/amenorrhea. She does not exercise on a regular basis and does not he home. In January of 2014 patient had an abdominal myomectomy as well as left ovarian cystectomy with the following pathology report:  Diagnosis  1. Cyst, biopsy  - CONSISTENT WITH BENIGN SEROUS CYST.  2. Ovary, left  - BRENNER TUMOR.  - MALIGNANT FEATURES ARE NOT IDENTIFIED.  3. Uterine fibroid(s)  - LEIOMYOMA.  - MALIGNANT FEATURES ARE NOT IDENTIFIED.  Her PCP is Dr. Johny Sax Who has been following her for her hypertension.Review her records indicated that in 1993 she had cryotherapy for cervical dysplasia at another facility. In 2005 she was treated for external genital warts. Her Pap smear in 2012 10 2013 was normal.   Patient was seen in the office in June 2015 was complaining of left-sided and back discomfort and for this reason an ultrasound had been done to better assess her adnexa because of her morbid obesity. The ultrasound demonstrated the following: Uterus measuring 9.7 x 5.3 x 4.9 cm. 2 small fibroids the largest one measuring 31 x 20 mm was noted. Normal endometrial cavity. Right ovary not seen. No apparent masses on either adnexa. Left ovary appears to be normal. Minimal fluid in the cul-de-sac  The patient has been Provera 10 mg one by mouth daily for 10 days every 35 days when she does not have a spontaneous menses. She states she is not currently sexually active. Her recent TSH and prolactin in June was normal.    Past medical history,surgical history, family history and social history were all reviewed and documented in the EPIC chart.  Gynecologic History Patient's last menstrual period was 08/30/2013. Contraception: none Last Pap: 2013. Results were: normal Last  mammogram: 2015. Results were: Stable left breast nodule no malignant features was a report by the radiologist with recommendations for followup in one year  Obstetric History OB History  Gravida Para Term Preterm AB SAB TAB Ectopic Multiple Living  0                  ROS: A ROS was performed and pertinent positives and negatives are included in the history.  GENERAL: No fevers or chills. HEENT: No change in vision, no earache, sore throat or sinus congestion. NECK: No pain or stiffness. CARDIOVASCULAR: No chest pain or pressure. No palpitations. PULMONARY: No shortness of breath, cough or wheeze. GASTROINTESTINAL: No abdominal pain, nausea, vomiting or diarrhea, melena or bright red blood per rectum. GENITOURINARY: No urinary frequency, urgency, hesitancy or dysuria. MUSCULOSKELETAL: No joint or muscle pain, no back pain, no recent trauma. DERMATOLOGIC: No rash, no itching, no lesions. ENDOCRINE: No polyuria, polydipsia, no heat or cold intolerance. No recent change in weight. HEMATOLOGICAL: No anemia or easy bruising or bleeding. NEUROLOGIC: No headache, seizures, numbness, tingling or weakness. PSYCHIATRIC: No depression, no loss of interest in normal activity or change in sleep pattern.     Exam: chaperone present  BP 134/84  Ht 5\' 2"  (1.575 m)  Wt 271 lb (122.925 kg)  BMI 49.55 kg/m2  LMP 08/30/2013  Body mass index is 49.55 kg/(m^2).  General appearance : Well developed well nourished female. No acute distress HEENT: Neck supple, trachea midline, no carotid bruits, no thyroidmegaly Lungs: Clear to auscultation, no  rhonchi or wheezes, or rib retractions  Heart: Regular rate and rhythm, no murmurs or gallops Breast:Examined in sitting and supine position were symmetrical in appearance, no palpable masses or tenderness,  no skin retraction, no nipple inversion, no nipple discharge, no skin discoloration, no axillary or supraclavicular lymphadenopathy Abdomen: no palpable masses or  tenderness, no rebound or guarding Extremities: no edema or skin discoloration or tenderness  Pelvic:  Bartholin, Urethra, Skene Glands: Within normal limits             Vagina: No gross lesions or discharge  Cervix: No gross lesions or discharge  Uterus  limited due to patient's abdominal girth see ultrasound report above,   Adnexa  same as above  Anus and perineum  normal   Rectovaginal  normal sphincter tone without palpated masses or tenderness             Hemoccult not indicated     Assessment/Plan:  43 y.o. female for annual exam who is morbidly obese which is continuing to her anovulation and amenorrhea. For this reason patient is taking Provera 10 mg for 10 days of each month if she does not have a spontaneous menses every 35 days. She was also advised that if she were to become sexually active that she should do a home pregnancy test before taking the Provera. We discussed importance of exercise and healthy nutrition. She was requesting an HIV test as well as syphilis and hepatitis B and C. for which blood test was drawn today. She was reminded of the importance of multiseptate exam. Pap smear not done today. PCP will be drawn her blood work.  Note: This dictation was prepared with  Dragon/digital dictation along withSmart phrase technology. Any transcriptional errors that result from this process are unintentional.   Terrance Mass MD, 3:15 PM 09/22/2013

## 2013-09-23 LAB — HIV ANTIBODY (ROUTINE TESTING W REFLEX): HIV 1&2 Ab, 4th Generation: NONREACTIVE

## 2013-09-23 LAB — HEPATITIS C ANTIBODY: HCV Ab: NEGATIVE

## 2013-09-23 LAB — HEPATITIS B SURFACE ANTIGEN: Hepatitis B Surface Ag: NEGATIVE

## 2013-11-14 ENCOUNTER — Ambulatory Visit (INDEPENDENT_AMBULATORY_CARE_PROVIDER_SITE_OTHER): Payer: BC Managed Care – PPO

## 2013-11-14 ENCOUNTER — Ambulatory Visit (INDEPENDENT_AMBULATORY_CARE_PROVIDER_SITE_OTHER): Payer: BC Managed Care – PPO | Admitting: Family Medicine

## 2013-11-14 VITALS — BP 124/86 | HR 84 | Temp 98.3°F | Resp 16 | Ht 63.0 in | Wt 268.0 lb

## 2013-11-14 DIAGNOSIS — M79675 Pain in left toe(s): Secondary | ICD-10-CM

## 2013-11-14 DIAGNOSIS — M1000 Idiopathic gout, unspecified site: Secondary | ICD-10-CM

## 2013-11-14 DIAGNOSIS — M109 Gout, unspecified: Secondary | ICD-10-CM

## 2013-11-14 DIAGNOSIS — D72829 Elevated white blood cell count, unspecified: Secondary | ICD-10-CM

## 2013-11-14 LAB — POCT CBC
Granulocyte percent: 70.5 %G (ref 37–80)
HCT, POC: 42.5 % (ref 37.7–47.9)
Hemoglobin: 13.3 g/dL (ref 12.2–16.2)
Lymph, poc: 3.6 — AB (ref 0.6–3.4)
MCH, POC: 26.7 pg — AB (ref 27–31.2)
MCHC: 31.4 g/dL — AB (ref 31.8–35.4)
MCV: 84.8 fL (ref 80–97)
MID (cbc): 0.6 (ref 0–0.9)
MPV: 7.8 fL (ref 0–99.8)
POC Granulocyte: 10 — AB (ref 2–6.9)
POC LYMPH PERCENT: 25.2 %L (ref 10–50)
POC MID %: 4.3 %M (ref 0–12)
Platelet Count, POC: 389 10*3/uL (ref 142–424)
RBC: 5.01 M/uL (ref 4.04–5.48)
RDW, POC: 15.4 %
WBC: 14.2 10*3/uL — AB (ref 4.6–10.2)

## 2013-11-14 LAB — POCT SEDIMENTATION RATE: POCT SED RATE: 22 mm/hr (ref 0–22)

## 2013-11-14 MED ORDER — INDOMETHACIN 50 MG PO CAPS: 50.0000 mg | ORAL_CAPSULE | Freq: Three times a day (TID) | ORAL | Status: DC

## 2013-11-14 NOTE — Progress Notes (Addendum)
Subjective:  This chart was scribed for Mindy Cheadle, MD by [name], Medical Scribe. This patient was seen in room Rm 10 and the patient's care was started at 8:17 PM.   Patient ID: Mindy Collins, female    DOB: 1970/12/04, 43 y.o.   MRN: [id number] Chief Complaint  Patient presents with  . Toe Pain    l big toe    HPI  HPI Comments: Mindy Collins is a 43 y.o. female with a history of leukocytosis.  Her gynecologist referred her to hematology for chronic luekocytosis.  Has her WBC's ranging from 12-14 as high as 20.  This was attributed to chronic inflammatory state as a smear was normal.  No further workup was indicated at this time due to unlikelhood lymphoproliferative or myeloproliferative disease.  She reports a history a of fatty liver with elevated LFT's so does no take Tylenol. She presents to [facility] complaining of right foot pain onset today.  Patient reports she was bit by a mosquito 5 days ago in her left mid first metatarsal.  Itching in the same area began two days ago.  Patient reports that today pain onset in the medial and dorsal 1st MTP joint with radiation proximally.  Patient reports hyperesthesias with the toe.  Last night, when her sheet brushed the toe, it was very painful and felt like a "100 lbs" on the toe.  Pain is exacerbated by touch, movement of the joint, and walking.  Patient reports the swelling began yesterday and has improved since this time.  She reports that she was able to walk while wearing tennis shoes today.  Patient denies taking any medications for this complaint as well of use of ice/heat.  Patient denies history of gout.  Patient reports she eats a diet high in red meat.    Patient also complains of a new pain in her left leg just below the knee.    Patient also complains of a rash on her lower left leg.   H/o pre-DM - hgba1c 6.0 1 mo prev.  Past Medical History  Diagnosis Date  . HPV (human papilloma virus) infection     WARTS/ TCA TX  .  Dysmenorrhea   . Hypertension   . Obesity   . GERD (gastroesophageal reflux disease)   . Seasonal allergies   . Anemia   . Fatty liver   . Colitis    Current Outpatient Prescriptions on File Prior to Visit  Medication Sig Dispense Refill  . Multiple Vitamin (MULTIVITAMIN) tablet Take 1 tablet by mouth daily.      Marland Kitchen olmesartan-hydrochlorothiazide (BENICAR HCT) 20-12.5 MG per tablet Take 1 tablet by mouth daily.      . cyclobenzaprine (FLEXERIL) 10 MG tablet Take 1 tablet (10 mg total) by mouth 3 (three) times daily as needed for muscle spasms.  30 tablet  0  . fluticasone (FLONASE) 50 MCG/ACT nasal spray Place into both nostrils daily.      . naproxen sodium (ANAPROX DS) 550 MG tablet Take 1 tablet (550 mg total) by mouth 2 (two) times daily with a meal.  40 tablet  0   No current facility-administered medications on file prior to visit.   Allergies  Allergen Reactions  . Esomeprazole Magnesium Shortness Of Breath    Nervousness; tolerates Aciphex.  . Adhesive [Tape] Itching and Rash    Itching and rash with adhesive tape  . Latex Itching and Dermatitis    Skin blistering     Review  of Systems  Constitutional: Positive for activity change. Negative for fever, chills and unexpected weight change.  Musculoskeletal: Positive for arthralgias, gait problem and joint swelling. Negative for back pain and myalgias.  Skin: Positive for color change. Negative for rash.  Neurological: Negative for weakness and numbness.  Psychiatric/Behavioral: Positive for sleep disturbance.        Objective:  BP 124/86  Pulse 84  Temp(Src) 98.3 F (36.8 C) (Oral)  Resp 16  Ht 5\' 3"  (1.6 m)  Wt 268 lb (121.564 kg)  BMI 47.49 kg/m2  SpO2 99%  LMP 10/18/2013  Physical Exam  Nursing note and vitals reviewed. Constitutional: She is oriented to person, place, and time. She appears well-developed and well-nourished. No distress.  HENT:  Head: Normocephalic and atraumatic.  Eyes: Conjunctivae  and EOM are normal.  Neck: Neck supple. No tracheal deviation present.  Cardiovascular: Normal rate.   DP/PT not palpable.  Pulmonary/Chest: Effort normal. No respiratory distress.  Musculoskeletal: Normal range of motion. She exhibits tenderness.  Some mild tenderness to palpation over medial aspect of first MTP joint.  No erythema, swelling, or warmth.  No lower extremity edema  Neurological: She is alert and oriented to person, place, and time.  Skin: Skin is warm and dry.  Psychiatric: She has a normal mood and affect. Her behavior is normal.    Primary X-ray reading by Dr. Brigitte Pulse: Left first toe no acute abnormality  EXAM: LEFT GREAT TOE  COMPARISON: None.  FINDINGS: There is no evidence of fracture or dislocation. The first digit appears intact. The first metatarsophalangeal joint is grossly unremarkable in appearance. Visualized joint spaces are preserved. The sesamoids of the first toe are grossly unremarkable in appearance. No significant soft tissue abnormalities are characterized on radiograph.  IMPRESSION: No evidence of fracture or dislocation.  Results for orders placed in visit on 11/14/13  POCT CBC      Result Value Ref Range   WBC 14.2 (*) 4.6 - 10.2 K/uL   Lymph, poc 3.6 (*) 0.6 - 3.4   POC LYMPH PERCENT 25.2  10 - 50 %L   MID (cbc) 0.6  0 - 0.9   POC MID % 4.3  0 - 12 %M   POC Granulocyte 10.0 (*) 2 - 6.9   Granulocyte percent 70.5  37 - 80 %G   RBC 5.01  4.04 - 5.48 M/uL   Hemoglobin 13.3  12.2 - 16.2 g/dL   HCT, POC 42.5  37.7 - 47.9 %   MCV 84.8  80 - 97 fL   MCH, POC 26.7 (*) 27 - 31.2 pg   MCHC 31.4 (*) 31.8 - 35.4 g/dL   RDW, POC 15.4     Platelet Count, POC 389  142 - 424 K/uL   MPV 7.8  0 - 99.8 fL  POCT SEDIMENTATION RATE      Result Value Ref Range   POCT SED RATE 22  0 - 22 mm/hr       Assessment & Plan:  8:24 PM Will order labs and provide post-op shoe.  Will prescribe anti-inflammatories and pain medications.  Discussed suspicion  of gout.  Great toe pain, left - Plan: POCT CBC, POCT SEDIMENTATION RATE, Uric acid, DG Toe Great Left  Leukocytosis - chronic - not sig changed from pt's baseline - s/p hematology eval sev mos prior - no f/u needed unless sxs worsen.  Gout of big toe = suspect initial episode of podagra. Post-op shoe given for prn use  .Patient counseled  on diet changes to reduce gout including increase water intake, advise RICE. oow tomorrow.  If recurs, advise patient to have BP medication changed as HZ\CTZ likely exacerbating.  May want to try Colcrys    Meds ordered this encounter  Medications  . fexofenadine-pseudoephedrine (ALLEGRA-D 24) 180-240 MG per 24 hr tablet    Sig: Take 1 tablet by mouth daily.  . medroxyPROGESTERone (PROVERA) 10 MG tablet    Sig: Take 10 mg by mouth daily.  . indomethacin (INDOCIN) 50 MG capsule    Sig: Take 1 capsule (50 mg total) by mouth 3 (three) times daily with meals.    Dispense:  60 capsule    Refill:  1    I personally performed the services described in this documentation, which was scribed in my presence. The recorded information has been reviewed and considered, and addended by me as needed.  Mindy Cheadle, MD MPH

## 2013-11-14 NOTE — Patient Instructions (Signed)
You can reduce your uric acid level by avoiding red meat, saturated fats (dairy fats, butter fats, animal fats), high fructose corn syrup, and beer and drinking more water.  Gout Gout is an inflammatory arthritis caused by a buildup of uric acid crystals in the joints. Uric acid is a chemical that is normally present in the blood. When the level of uric acid in the blood is too high it can form crystals that deposit in your joints and tissues. This causes joint redness, soreness, and swelling (inflammation). Repeat attacks are common. Over time, uric acid crystals can form into masses (tophi) near a joint, destroying bone and causing disfigurement. Gout is treatable and often preventable. CAUSES  The disease begins with elevated levels of uric acid in the blood. Uric acid is produced by your body when it breaks down a naturally found substance called purines. Certain foods you eat, such as meats and fish, contain high amounts of purines. Causes of an elevated uric acid level include:  Being passed down from parent to child (heredity).  Diseases that cause increased uric acid production (such as obesity, psoriasis, and certain cancers).  Excessive alcohol use.  Diet, especially diets rich in meat and seafood.  Medicines, including certain cancer-fighting medicines (chemotherapy), water pills (diuretics), and aspirin.  Chronic kidney disease. The kidneys are no longer able to remove uric acid well.  Problems with metabolism. Conditions strongly associated with gout include:  Obesity.  High blood pressure.  High cholesterol.  Diabetes. Not everyone with elevated uric acid levels gets gout. It is not understood why some people get gout and others do not. Surgery, joint injury, and eating too much of certain foods are some of the factors that can lead to gout attacks. SYMPTOMS   An attack of gout comes on quickly. It causes intense pain with redness, swelling, and warmth in a  joint.  Fever can occur.  Often, only one joint is involved. Certain joints are more commonly involved:  Base of the big toe.  Knee.  Ankle.  Wrist.  Finger. Without treatment, an attack usually goes away in a few days to weeks. Between attacks, you usually will not have symptoms, which is different from many other forms of arthritis. DIAGNOSIS  Your caregiver will suspect gout based on your symptoms and exam. In some cases, tests may be recommended. The tests may include:  Blood tests.  Urine tests.  X-rays.  Joint fluid exam. This exam requires a needle to remove fluid from the joint (arthrocentesis). Using a microscope, gout is confirmed when uric acid crystals are seen in the joint fluid. TREATMENT  There are two phases to gout treatment: treating the sudden onset (acute) attack and preventing attacks (prophylaxis).  Treatment of an Acute Attack.  Medicines are used. These include anti-inflammatory medicines or steroid medicines.  An injection of steroid medicine into the affected joint is sometimes necessary.  The painful joint is rested. Movement can worsen the arthritis.  You may use warm or cold treatments on painful joints, depending which works best for you.  Treatment to Prevent Attacks.  If you suffer from frequent gout attacks, your caregiver may advise preventive medicine. These medicines are started after the acute attack subsides. These medicines either help your kidneys eliminate uric acid from your body or decrease your uric acid production. You may need to stay on these medicines for a very long time.  The early phase of treatment with preventive medicine can be associated with an increase in acute  gout attacks. For this reason, during the first few months of treatment, your caregiver may also advise you to take medicines usually used for acute gout treatment. Be sure you understand your caregiver's directions. Your caregiver may make several adjustments  to your medicine dose before these medicines are effective.  Discuss dietary treatment with your caregiver or dietitian. Alcohol and drinks high in sugar and fructose and foods such as meat, poultry, and seafood can increase uric acid levels. Your caregiver or dietitian can advise you on drinks and foods that should be limited. HOME CARE INSTRUCTIONS   Do not take aspirin to relieve pain. This raises uric acid levels.  Only take over-the-counter or prescription medicines for pain, discomfort, or fever as directed by your caregiver.  Rest the joint as much as possible. When in bed, keep sheets and blankets off painful areas.  Keep the affected joint raised (elevated).  Apply warm or cold treatments to painful joints. Use of warm or cold treatments depends on which works best for you.  Use crutches if the painful joint is in your leg.  Drink enough fluids to keep your urine clear or pale yellow. This helps your body get rid of uric acid. Limit alcohol, sugary drinks, and fructose drinks.  Follow your dietary instructions. Pay careful attention to the amount of protein you eat. Your daily diet should emphasize fruits, vegetables, whole grains, and fat-free or low-fat milk products. Discuss the use of coffee, vitamin C, and cherries with your caregiver or dietitian. These may be helpful in lowering uric acid levels.  Maintain a healthy body weight. SEEK MEDICAL CARE IF:   You develop diarrhea, vomiting, or any side effects from medicines.  You do not feel better in 24 hours, or you are getting worse. SEEK IMMEDIATE MEDICAL CARE IF:   Your joint becomes suddenly more tender, and you have chills or a fever. MAKE SURE YOU:   Understand these instructions.  Will watch your condition.  Will get help right away if you are not doing well or get worse. Document Released: 01/25/2000 Document Revised: 06/13/2013 Document Reviewed: 09/10/2011 [facility] Patient Information 2015 [place], [place].  This information is not intended to replace advice given to you by your health care provider. Make sure you discuss any questions you have with your health care provider. Information for patients with Gout  Gout defined-Gout occurs when urate crystals accumulate in your joint causing the inflammation and intense pain of gout attack.  Urate crystals can form when you have high levels of uric acid in your blood.  Your body produces uric acid when it breaks down prurines-substances that are found naturally in your body, as well as in certain foods such as organ meats, anchioves, herring, asparagus, and mushrooms.  Normally uric acid dissolves in your blood and passes through your kidneys into your urine.  But sometimes your body either produces too much uric acid or your kidneys excrete too little uric acid.  When this happens, uric acid can build up, forming sharp needle-like urate crystals in a joint or surrounding tissue that cause pain, inflammation and swelling.    Gout is characterized by sudden, severe attacks of pain, redness and tenderness in joints, often the joint at the base of the big toe.  Gout is complex form of arthritis that can affect anyone.  Men are more likely to get gout but women become increasingly more susceptible to gout after menopause.  An acute attack of gout can wake you up in the  middle of the night with the sensation that your big toe is on fire.  The affected joint is hot, swollen and so tender that even the weight or the sheet on it may seem intolerable.  If you experience symptoms of an acute gout attack it is important to your doctor as soon as the symptoms start.  Gout that goes untreated can lead to worsening pain and joint damage.  Risk Factors:  You are more likely to develop gout if you have high levels of uric acid in your body.    Factors that increase the uric acid level in your body include:  Lifestyle factors.  Excessive alcohol use-generally more than  two drinks a day for men and more than one for women increase the risk of gout.  Medical conditions.  Certain conditions make it more likely that you will develop gout.  These include hypertension, and chronic conditions such as diabetes, high levels of fat and cholesterol in the blood, and narrowing of the arteries.  Certain medications.  The uses of Thiazide diuretics- commonly used to treat hypertension and low dose aspirin can also increase uric acid levels.  Family history of gout.  If other members of your family have had gout, you are more likely to develop the disease.  Age and sex. Gout occurs more often in men than it does in women, primarily because women tend to have lower uric acid levels than men do.  Men are more likely to develop gout earlier usually between the ages of 88-50- whereas women generally develop signs and symptoms after menopause.    Tests and diagnosis:  Tests to help diagnose gout may include:  Blood test.  Your doctor may recommend a blood test to measure the uric acid level in your blood .  Blood tests can be misleading, though.  Some people have high uric acid levels but never experience gout.  And some people have signs and symptoms of gout, but don't have unusual levels of uric acid in their blood.  Joint fluid test.  Your doctor may use a needle to draw fluid from your affected joint.  When examined under the microscope, your joint fluid may reveal urate crystals.  Treatment:  Treatment for gout usually involves medications.  What medications you and your doctor choose will be based on your current health and other medications you currently take.  Gout medications can be used to treat acute gout attacks and prevent future attacks as well as reduce your risk of complications from gout such as the development of tophi from urate crystal deposits.  Alternative medicine:   Certain foods have been studied for their potential to lower uric acid levels,  including:  Coffee.  Studies have found an association between coffee drinking (regular and decaf) and lower uric acid levels.  The evidence is not enough to encourage non-coffee drinkers to start, but it may give clues to new ways of treating gout in the future.  Vitamin C.  Supplements containing vitamin C may reduce the levels of uric acid in your blood.  However, vitamin as a treatment for gout. Don't assume that if a little vitamin C is good, than lots is better.  Megadoses of vitamin C may increase your bodies uric acid levels.  Cherries.  Cherries have been associated with lower levels of uric acid in studies, but it isn't clear if they have any effect on gout signs and symptoms.  Eating more cherries and other dar-colored fruits, such as blackberries,  blueberries, purple grapes and raspberries, may be a safe way to support your gout treatment.    Lifestyle/Diet Recommendations:   Drink 8 to 16 cups ( about 2 to 4 liters) of fluid each day, with at least half being water.  Avoid alcohol  Eat a moderate amount of protein, preferably from healthy sources, such as low-fat or fat-free dairy, tofu, eggs, and nut butters.  Limit you daily intake of meat, fish, and poultry to 4 to 6 ounces.  Avoid high fat meats and desserts.  Decrease you intake of shellfish, beef, lamb, pork, eggs and cheese.  Choose a good source of vitamin C daily such as citrus fruits, strawberries, broccoli,  brussel sprouts, papaya, and cantaloupe.   Choose a good source of vitamin A every other day such as yellow fruits, or dark green/yellow vegetables.  Avoid drastic weigh reduction or fasting.  If weigh loss is desired lose it over a period of several months.  See "dietary considerations.." chart for specific food recommendations.  Dietary Considerations for people with Gout  Food with negligible purine content (0-15 mg of purine nitrogen per 100 grams food)  May use as desired except on calorie  variations  Non fat milk Cocoa Cereals (except in list II) Hard candies  Buttermilk Carbonated drinks Vegetables (except in list II) Sherbet  Coffee Fruits Sugar Honey  Tea Cottage Cheese Gelatin-jell-o Salt  Fruit juice Breads Angel food Cake   Herbs/spices Jams/Jellies [employer]    Foods that do not contain excessive purine content, but must be limited due to fat content  Cream Eggs Oil and Salad Dressing  Half and Half Peanut Butter Chocolate  Whole Milk Cakes Potato Chips  Butter Ice Cream Fried Foods  Cheese Nuts Waffles, pancakes   List II: Food with moderate purine content (50-150 mg of purine nitrogen per 100 grams of food)  Limit total amount each day to 5 oz. cooked Lean meat, other than those on list III   Poultry, other than those on list III Fish, other than those on list III   Seafood, other than those on list III  These foods may be used occasionally  Peas Lentils Bran  Spinach Oatmeal Dried Beans and Peas  Asparagus Wheat Germ Mushrooms   Additional information about meat choices  Choose fish and poultry, particularly without skin, often.  Select lean, well trimmed cuts of meat.  Avoid all fatty meats, bacon , sausage, fried meats, fried fish, or poultry, luncheon meats, cold cuts, hot dogs, meats canned or frozen in gravy, spareribs and frozen and packaged prepared meats.   List III: Foods with HIGH purine content / Foods to AVOID (150-800 mg of purine nitrogen per 100 grams of food)  Anchovies Herring Meat Broths  Liver Mackerel Meat Extracts  Kidney Scallops Meat Drippings  Sardines Wild Game Mincemeat  Sweetbreads Goose Gravy  Heart Tongue Yeast, baker's and brewers   Commercial soups made with any of the foods listed in List II or List III  In addition avoid all alcoholic beverages

## 2013-11-15 LAB — URIC ACID: Uric Acid, Serum: 6.2 mg/dL (ref 2.4–7.0)

## 2013-11-25 ENCOUNTER — Other Ambulatory Visit: Payer: Self-pay

## 2013-12-05 ENCOUNTER — Ambulatory Visit (INDEPENDENT_AMBULATORY_CARE_PROVIDER_SITE_OTHER): Payer: Self-pay | Admitting: Emergency Medicine

## 2013-12-05 VITALS — BP 120/80 | HR 90 | Temp 98.3°F | Resp 20 | Ht 62.0 in | Wt 269.1 lb

## 2013-12-05 DIAGNOSIS — S39012A Strain of muscle, fascia and tendon of lower back, initial encounter: Secondary | ICD-10-CM

## 2013-12-05 MED ORDER — ACETAMINOPHEN-CODEINE #3 300-30 MG PO TABS: 1.0000 | ORAL_TABLET | ORAL | Status: DC | PRN

## 2013-12-05 NOTE — Progress Notes (Signed)
[facility] [address] [place] [address] [phone]  Date:  12/05/2013   Name:  Mindy Collins   DOB:  02-06-1971   MRN:  [id number]  PCP:  No PCP Per Patient    Chief Complaint: Flank Pain and Spasms   History of Present Illness:  Mindy Collins is a 43 y.o. very pleasant female patient who presents with the following:  Injured her left back lifting her mother last Monday.  Two days later developed pain in the left back Pain radiates around the left flank to the left side. Occasionally has pain in the epigastrium that lasts minutes to an hour. No associated radiation of pain, shortness of breath, nausea or vomiting.  Has been taking NSAIDS. No dysuria, urgency or frequency.  No hematuria. No GYN symptoms. No history of direct injury to back. No improvement with over the counter medications or other home remedies.  Denies other complaint or health concern today.   Patient Active Problem List   Diagnosis Date Noted  . Leukocytosis 11/14/2013  . Morbid obesity 08/21/2013  . Sebaceous cyst of labia 04/02/2012  . IBS (irritable bowel syndrome) 09/30/2011  . CHEST PAIN UNSPECIFIED 04/05/2010  . HYPERTENSION, BENIGN 04/05/2010    Past Medical History  Diagnosis Date  . HPV (human papilloma virus) infection     WARTS/ TCA [place]  . Dysmenorrhea   . Hypertension   . Obesity   . GERD (gastroesophageal reflux disease)   . Seasonal allergies   . Anemia   . Fatty liver   . Colitis     Past Surgical History  Procedure Laterality Date  . Gynecologic cryosurgery  1993    DYSPLASIA CERVIX/   . Myomectomy abdominal approach  04/31/2007  . Myomectomy  02/17/2011    Procedure: MYOMECTOMY;  Surgeon: Terrance Mass, MD;  Location: [facility];  Service: Gynecology;  Laterality: N/A;  I am hoping to get a 7:30am time on Dec 4, Dec 11 or Dec 12. It not available I will check on some 1:00 pm times. Thanks  . Ovarian cyst removal  02/17/2011    Procedure: OVARIAN  CYSTECTOMY;  Surgeon: Terrance Mass, MD;  Location: [facility];  Service: Gynecology;  Laterality: Left;    History  Substance Use Topics  . Smoking status: Never Smoker   . Smokeless tobacco: Never Used     Comment: denies tobacco   . Alcohol Use: No    Family History  Problem Relation Age of Onset  . Hypertension Mother   . Heart disease Maternal Grandmother   . Cancer Father     LUNG  . Breast cancer Maternal Aunt   . Diabetes Maternal Grandfather     Allergies  Allergen Reactions  . Esomeprazole Magnesium Shortness Of Breath    Nervousness; tolerates Aciphex.  . Adhesive [Tape] Itching and Rash    Itching and rash with adhesive tape  . Latex Itching and Dermatitis    Skin blistering    Medication list has been reviewed and updated.  Current Outpatient Prescriptions on File Prior to Visit  Medication Sig Dispense Refill  . medroxyPROGESTERone (PROVERA) 10 MG tablet Take 10 mg by mouth daily as needed.       . Multiple Vitamin (MULTIVITAMIN) tablet Take 1 tablet by mouth daily.      . naproxen sodium (ANAPROX DS) 550 MG tablet Take 1 tablet (550 mg total) by mouth 2 (two) times daily with a meal.  40  tablet  0  . olmesartan-hydrochlorothiazide (BENICAR HCT) 20-12.5 MG per tablet Take 1 tablet by mouth daily.       No current facility-administered medications on file prior to visit.    Review of Systems:  As per HPI, otherwise negative.    Physical Examination: Filed Vitals:   12/05/13 1903  BP: 120/80  Pulse: 90  Temp: 98.3 F (36.8 C)  Resp: 20   Filed Vitals:   12/05/13 1903  Height: 5\' 2"  (1.575 m)  Weight: 269 lb 2 oz (122.074 kg)   Body mass index is 49.21 kg/(m^2). Ideal Body Weight: Weight in (lb) to have BMI = 25: 136.4  GEN: WDWN, NAD, Non-toxic, A & O x 3 HEENT: Atraumatic, Normocephalic. Neck supple. No masses, No LAD. Ears and Nose: No external deformity. CV: RRR, No M/G/R. No JVD. No thrill. No extra heart sounds. PULM: CTA B, no  wheezes, crackles, rhonchi. No retractions. No resp. distress. No accessory muscle use. ABD: S, NT, ND, +BS. No rebound. No HSM. EXTR: No c/c/e NEURO Normal gait.  PSYCH: Normally interactive. Conversant. Not depressed or anxious appearing.  Calm demeanor.  BACK:  Tender left cva region.  Reproduces pain  Assessment and Plan: Back strain Stop anaprox tyl #3 Continue miralax  Signed,  Ellison Carwin, MD

## 2013-12-05 NOTE — Patient Instructions (Signed)

## 2013-12-15 ENCOUNTER — Ambulatory Visit (INDEPENDENT_AMBULATORY_CARE_PROVIDER_SITE_OTHER): Payer: 59 | Admitting: Emergency Medicine

## 2013-12-15 VITALS — BP 134/90 | HR 103 | Temp 98.4°F | Resp 18 | Ht 62.0 in | Wt 264.4 lb

## 2013-12-15 DIAGNOSIS — I10 Essential (primary) hypertension: Secondary | ICD-10-CM

## 2013-12-15 LAB — POCT URINALYSIS DIPSTICK
Bilirubin, UA: NEGATIVE
Glucose, UA: NEGATIVE
Ketones, UA: NEGATIVE
Leukocytes, UA: NEGATIVE
Nitrite, UA: NEGATIVE
Protein, UA: NEGATIVE
Spec Grav, UA: 1.025
Urobilinogen, UA: 0.2
pH, UA: 5.5

## 2013-12-15 LAB — POCT UA - MICROSCOPIC ONLY
Casts, Ur, LPF, POC: NEGATIVE
Crystals, Ur, HPF, POC: NEGATIVE
Mucus, UA: NEGATIVE
Yeast, UA: NEGATIVE

## 2013-12-15 LAB — POCT GLYCOSYLATED HEMOGLOBIN (HGB A1C): Hemoglobin A1C: 5.4

## 2013-12-15 MED ORDER — OLMESARTAN MEDOXOMIL-HCTZ 20-12.5 MG PO TABS: 1.0000 | ORAL_TABLET | Freq: Every day | ORAL | Status: DC

## 2013-12-15 NOTE — Progress Notes (Signed)
[facility] [address] [place] [address] [phone]  Date:  12/15/2013   Name:  Mindy Collins   DOB:  Dec 08, 1970   MRN:  [id number]  PCP:  No PCP Per Patient    Chief Complaint: CPE   History of Present Illness:  Mindy Collins is a 43 y.o. very pleasant female patient who presents with the following:  Patient is a morbidly obese woman with a history of hypertension.  Is fasting and requests refills of her medications.  She usually has a complete exam with her gyn. Has no acute complaints. Unable to make significant weight change   Patient Active Problem List   Diagnosis Date Noted  . Leukocytosis 11/14/2013  . Morbid obesity 08/21/2013  . Sebaceous cyst of labia 04/02/2012  . IBS (irritable bowel syndrome) 09/30/2011  . CHEST PAIN UNSPECIFIED 04/05/2010  . HYPERTENSION, BENIGN 04/05/2010    Past Medical History  Diagnosis Date  . HPV (human papilloma virus) infection     WARTS/ TCA [place]  . Dysmenorrhea   . Hypertension   . Obesity   . GERD (gastroesophageal reflux disease)   . Seasonal allergies   . Anemia   . Fatty liver   . Colitis     Past Surgical History  Procedure Laterality Date  . Gynecologic cryosurgery  1993    DYSPLASIA CERVIX/   . Myomectomy abdominal approach  04/31/2007  . Myomectomy  02/17/2011    Procedure: MYOMECTOMY;  Surgeon: Terrance Mass, MD;  Location: [facility];  Service: Gynecology;  Laterality: N/A;  I am hoping to get a 7:30am time on Dec 4, Dec 11 or Dec 12. It not available I will check on some 1:00 pm times. Thanks  . Ovarian cyst removal  02/17/2011    Procedure: OVARIAN CYSTECTOMY;  Surgeon: Terrance Mass, MD;  Location: [facility];  Service: Gynecology;  Laterality: Left;    History  Substance Use Topics  . Smoking status: Never Smoker   . Smokeless tobacco: Never Used     Comment: denies tobacco   . Alcohol Use: No    Family History  Problem Relation Age of Onset  . Hypertension Mother   . Heart  disease Maternal Grandmother   . Cancer Father     LUNG  . Breast cancer Maternal Aunt   . Diabetes Maternal Grandfather     Allergies  Allergen Reactions  . Esomeprazole Magnesium Shortness Of Breath    Nervousness; tolerates Aciphex.  . Adhesive [Tape] Itching and Rash    Itching and rash with adhesive tape  . Latex Itching and Dermatitis    Skin blistering    Medication list has been reviewed and updated.  Current Outpatient Prescriptions on File Prior to Visit  Medication Sig Dispense Refill  . Multiple Vitamin (MULTIVITAMIN) tablet Take 1 tablet by mouth daily.    Marland Kitchen olmesartan-hydrochlorothiazide (BENICAR HCT) 20-12.5 MG per tablet Take 1 tablet by mouth daily.    Marland Kitchen acetaminophen-codeine (TYLENOL #3) 300-30 MG per tablet Take 1-2 tablets by mouth every 4 (four) hours as needed. 30 tablet 0  . cyclobenzaprine (FLEXERIL) 10 MG tablet Take 10 mg by mouth 3 (three) times daily as needed for muscle spasms.    . medroxyPROGESTERone (PROVERA) 10 MG tablet Take 10 mg by mouth daily as needed.     . naproxen sodium (ANAPROX DS) 550 MG tablet Take 1 tablet (550 mg total) by mouth 2 (two) times daily with a meal.  40 tablet 0   No current facility-administered medications on file prior to visit.    Review of Systems:  As per HPI, otherwise negative.    Physical Examination: Filed Vitals:   12/15/13 1901  BP: 134/90  Pulse: 103  Temp: 98.4 F (36.9 C)  Resp: 18   Filed Vitals:   12/15/13 1901  Height: 5\' 2"  (1.575 m)  Weight: 264 lb 6.4 oz (119.931 kg)   Body mass index is 48.35 kg/(m^2). Ideal Body Weight: Weight in (lb) to have BMI = 25: 136.4  GEN: morbidly obese, NAD, Non-toxic, A & O x 3 HEENT: Atraumatic, Normocephalic. Neck supple. No masses, No LAD. Ears and Nose: No external deformity. CV: RRR, No M/G/R. No JVD. No thrill. No extra heart sounds. PULM: CTA B, no wheezes, crackles, rhonchi. No retractions. No resp. distress. No accessory muscle use. ABD: S,  NT, ND, +BS. No rebound. No HSM. EXTR: No c/c/e NEURO Normal gait.  PSYCH: Normally interactive. Conversant. Not depressed or anxious appearing.  Calm demeanor.    Assessment and Plan: Morbid obesity  Hypertension Labs pending Refills  Signed,  Ellison Carwin, MD

## 2013-12-15 NOTE — Patient Instructions (Signed)

## 2013-12-15 NOTE — Progress Notes (Deleted)
Opened in error

## 2013-12-16 LAB — COMPREHENSIVE METABOLIC PANEL
ALT: 43 U/L — ABNORMAL HIGH (ref 0–35)
AST: 29 U/L (ref 0–37)
Albumin: 4.1 g/dL (ref 3.5–5.2)
Alkaline Phosphatase: 92 U/L (ref 39–117)
BUN: 8 mg/dL (ref 6–23)
CO2: 30 mEq/L (ref 19–32)
Calcium: 9.5 mg/dL (ref 8.4–10.5)
Chloride: 98 mEq/L (ref 96–112)
Creat: 0.61 mg/dL (ref 0.50–1.10)
Glucose, Bld: 91 mg/dL (ref 70–99)
Potassium: 3.4 mEq/L — ABNORMAL LOW (ref 3.5–5.3)
Sodium: 141 mEq/L (ref 135–145)
Total Bilirubin: 0.9 mg/dL (ref 0.2–1.2)
Total Protein: 7.2 g/dL (ref 6.0–8.3)

## 2013-12-16 LAB — LIPID PANEL
Cholesterol: 228 mg/dL — ABNORMAL HIGH (ref 0–200)
HDL: 45 mg/dL (ref >39)
LDL Cholesterol: 153 mg/dL — ABNORMAL HIGH (ref 0–99)
Total CHOL/HDL Ratio: 5.1 Ratio
Triglycerides: 150 mg/dL — ABNORMAL HIGH (ref <150)
VLDL: 30 mg/dL (ref 0–40)

## 2013-12-16 LAB — TSH: TSH: 1.765 u[IU]/mL (ref 0.350–4.500)

## 2013-12-18 ENCOUNTER — Ambulatory Visit (INDEPENDENT_AMBULATORY_CARE_PROVIDER_SITE_OTHER): Payer: 59 | Admitting: Emergency Medicine

## 2013-12-18 VITALS — BP 133/86 | HR 91 | Temp 98.9°F | Resp 20 | Ht 62.0 in | Wt 262.1 lb

## 2013-12-18 DIAGNOSIS — R197 Diarrhea, unspecified: Secondary | ICD-10-CM

## 2013-12-18 LAB — POCT CBC
Granulocyte percent: 70.3 %G (ref 37–80)
HCT, POC: 41.1 % (ref 37.7–47.9)
Hemoglobin: 12.7 g/dL (ref 12.2–16.2)
Lymph, poc: 2.8 (ref 0.6–3.4)
MCH, POC: 25.8 pg — AB (ref 27–31.2)
MCHC: 30.9 g/dL — AB (ref 31.8–35.4)
MCV: 83.5 fL (ref 80–97)
MID (cbc): 0.6 (ref 0–0.9)
MPV: 7.9 fL (ref 0–99.8)
POC Granulocyte: 8.1 — AB (ref 2–6.9)
POC LYMPH PERCENT: 24.5 %L (ref 10–50)
POC MID %: 5.2 %M (ref 0–12)
Platelet Count, POC: 390 10*3/uL (ref 142–424)
RBC: 4.92 M/uL (ref 4.04–5.48)
RDW, POC: 15.1 %
WBC: 11.5 10*3/uL — AB (ref 4.6–10.2)

## 2013-12-18 LAB — COMPREHENSIVE METABOLIC PANEL
ALT: 39 U/L — ABNORMAL HIGH (ref 0–35)
AST: 28 U/L (ref 0–37)
Albumin: 4 g/dL (ref 3.5–5.2)
Alkaline Phosphatase: 93 U/L (ref 39–117)
BUN: 10 mg/dL (ref 6–23)
CO2: 31 mEq/L (ref 19–32)
Calcium: 9.3 mg/dL (ref 8.4–10.5)
Chloride: 101 mEq/L (ref 96–112)
Creat: 0.66 mg/dL (ref 0.50–1.10)
Glucose, Bld: 97 mg/dL (ref 70–99)
Potassium: 3.6 mEq/L (ref 3.5–5.3)
Sodium: 140 mEq/L (ref 135–145)
Total Bilirubin: 0.7 mg/dL (ref 0.2–1.2)
Total Protein: 7.3 g/dL (ref 6.0–8.3)

## 2013-12-18 MED ORDER — LOPERAMIDE HCL 2 MG PO TABS: ORAL_TABLET | ORAL | Status: DC

## 2013-12-18 MED ORDER — LISINOPRIL-HYDROCHLOROTHIAZIDE 20-12.5 MG PO TABS: 1.0000 | ORAL_TABLET | Freq: Every day | ORAL | Status: DC

## 2013-12-18 NOTE — Patient Instructions (Signed)
Diarrhea Diarrhea is frequent loose and watery bowel movements. It can cause you to feel weak and dehydrated. Dehydration can cause you to become tired and thirsty, have a dry mouth, and have decreased urination that often is dark yellow. Diarrhea is a sign of another problem, most often an infection that will not last long. In most cases, diarrhea typically lasts 2-3 days. However, it can last longer if it is a sign of something more serious. It is important to treat your diarrhea as directed by your caregiver to lessen or prevent future episodes of diarrhea. CAUSES  Some common causes include:  Gastrointestinal infections caused by viruses, bacteria, or parasites.  Food poisoning or food allergies.  Certain medicines, such as antibiotics, chemotherapy, and laxatives.  Artificial sweeteners and fructose.  Digestive disorders. HOME CARE INSTRUCTIONS  Ensure adequate fluid intake (hydration): Have 1 cup (8 oz) of fluid for each diarrhea episode. Avoid fluids that contain simple sugars or sports drinks, fruit juices, whole milk products, and sodas. Your urine should be clear or pale yellow if you are drinking enough fluids. Hydrate with an oral rehydration solution that you can purchase at pharmacies, retail stores, and online. You can prepare an oral rehydration solution at home by mixing the following ingredients together:   - tsp table salt.   tsp baking soda.   tsp salt substitute containing potassium chloride.  1  tablespoons sugar.  1 L (34 oz) of water.  Certain foods and beverages may increase the speed at which food moves through the gastrointestinal (GI) tract. These foods and beverages should be avoided and include:  Caffeinated and alcoholic beverages.  High-fiber foods, such as raw fruits and vegetables, nuts, seeds, and whole grain breads and cereals.  Foods and beverages sweetened with sugar alcohols, such as xylitol, sorbitol, and mannitol.  Some foods may be well  tolerated and may help thicken stool including:  Starchy foods, such as rice, toast, pasta, low-sugar cereal, oatmeal, grits, baked potatoes, crackers, and bagels.  Bananas.  Applesauce.  Add probiotic-rich foods to help increase healthy bacteria in the GI tract, such as yogurt and fermented milk products.  Wash your hands well after each diarrhea episode.  Only take over-the-counter or prescription medicines as directed by your caregiver.  Take a warm bath to relieve any burning or pain from frequent diarrhea episodes. SEEK IMMEDIATE MEDICAL CARE IF:   You are unable to keep fluids down.  You have persistent vomiting.  You have blood in your stool, or your stools are black and tarry.  You do not urinate in 6-8 hours, or there is only a small amount of very dark urine.  You have abdominal pain that increases or localizes.  You have weakness, dizziness, confusion, or light-headedness.  You have a severe headache.  Your diarrhea gets worse or does not get better.  You have a fever or persistent symptoms for more than 2-3 days.  You have a fever and your symptoms suddenly get worse. MAKE SURE YOU:   Understand these instructions.  Will watch your condition.  Will get help right away if you are not doing well or get worse. Document Released: 01/17/2002 Document Revised: 06/13/2013 Document Reviewed: 10/05/2011 [facility] Patient Information 2015 [place], [place]. This information is not intended to replace advice given to you by your health care provider. Make sure you discuss any questions you have with your health care provider. Clear Liquid Diet A clear liquid diet is a short-term diet that is prescribed to provide the  necessary fluid and basic energy you need when you can have nothing else. The clear liquid diet consists of liquids or solids that will become liquid at room temperature. You should be able to see through the liquid. There are many reasons that you may be  restricted to clear liquids, such as:  When you have a sudden-onset (acute) condition that occurs before or after surgery.  To help your body slowly get adjusted to food again after a long period when you were unable to have food.  Replacement of fluids when you have a diarrheal disease.  When you are going to have certain exams, such as a colonoscopy, in which instruments are inserted inside your body to look at parts of your digestive system. WHAT CAN I HAVE? A clear liquid diet does not provide all the nutrients you need. It is important to choose a variety of the following items to get as many nutrients as possible:  Vegetable juices that do not have pulp.  Fruit juices and fruit drinks that do not have pulp.  Coffee (regular or decaffeinated), tea, or soda at the discretion of your health care provider.  Clear bouillon, broth, or strained broth-based soups.  High-protein and flavored gelatins.  Sugar or honey.  Ices or frozen ice pops that do not contain milk. If you are not sure whether you can have certain items, you should ask your health care provider. You may also ask your health care provider if there are any other clear liquid options. Document Released: 01/27/2005 Document Revised: 02/01/2013 Document Reviewed: 12/24/2012 [facility] Patient Information 2015 [place], [place]. This information is not intended to replace advice given to you by your health care provider. Make sure you discuss any questions you have with your health care provider.

## 2013-12-18 NOTE — Progress Notes (Signed)
[facility] [address] [place] [address] [phone]  Date:  12/18/2013   Name:  Mindy Collins   DOB:  [birth year]   MRN:  [id number]  PCP:  No PCP Per Patient    Chief Complaint: Diarrhea   History of Present Illness:  Mindy Collins is a 43 y.o. very pleasant female patient who presents with the following:  Ill for a week with intermittent loose stools.  The patient has no complaint of blood, mucous, or pus in her stools.  Having as many as 6-8 stools a day with no stools for several days. Painless.  No nausea or vomiting.  No fever or chills No travel or sketchy water Seems to ease up with fasting and liquids day following  Has been on benicar for "years" Denies other complaint or health concern today.   Patient Active Problem List   Diagnosis Date Noted  . Leukocytosis 11/14/2013  . Morbid obesity 08/21/2013  . Sebaceous cyst of labia 04/02/2012  . IBS (irritable bowel syndrome) 09/30/2011  . CHEST PAIN UNSPECIFIED 04/05/2010  . HYPERTENSION, BENIGN 04/05/2010    Past Medical History  Diagnosis Date  . HPV (human papilloma virus) infection     WARTS/ TCA [place]  . Dysmenorrhea   . Hypertension   . Obesity   . GERD (gastroesophageal reflux disease)   . Seasonal allergies   . Anemia   . Fatty liver   . Colitis     Past Surgical History  Procedure Laterality Date  . Gynecologic cryosurgery  1993    DYSPLASIA CERVIX/   . Myomectomy abdominal approach  04/31/2007  . Myomectomy  02/17/2011    Procedure: MYOMECTOMY;  Surgeon: Terrance Mass, MD;  Location: [facility];  Service: Gynecology;  Laterality: N/A;  I am hoping to get a 7:30am time on Dec 4, Dec 11 or Dec 12. It not available I will check on some 1:00 pm times. Thanks  . Ovarian cyst removal  02/17/2011    Procedure: OVARIAN CYSTECTOMY;  Surgeon: Terrance Mass, MD;  Location: [facility];  Service: Gynecology;  Laterality: Left;    History  Substance Use Topics  . Smoking status:  Never Smoker   . Smokeless tobacco: Never Used     Comment: denies tobacco   . Alcohol Use: No    Family History  Problem Relation Age of Onset  . Hypertension Mother   . Heart disease Maternal Grandmother   . Cancer Father     LUNG  . Breast cancer Maternal Aunt   . Diabetes Maternal Grandfather     Allergies  Allergen Reactions  . Esomeprazole Magnesium Shortness Of Breath    Nervousness; tolerates Aciphex.  . Adhesive [Tape] Itching and Rash    Itching and rash with adhesive tape  . Latex Itching and Dermatitis    Skin blistering    Medication list has been reviewed and updated.  Current Outpatient Prescriptions on File Prior to Visit  Medication Sig Dispense Refill  . acetaminophen-codeine (TYLENOL #3) 300-30 MG per tablet Take 1-2 tablets by mouth every 4 (four) hours as needed. 30 tablet 0  . cyclobenzaprine (FLEXERIL) 10 MG tablet Take 10 mg by mouth 3 (three) times daily as needed for muscle spasms.    . medroxyPROGESTERone (PROVERA) 10 MG tablet Take 10 mg by mouth daily as needed.     . Multiple Vitamin (MULTIVITAMIN) tablet Take 1 tablet by mouth daily.    . naproxen sodium (  ANAPROX DS) 550 MG tablet Take 1 tablet (550 mg total) by mouth 2 (two) times daily with a meal. 40 tablet 0  . olmesartan-hydrochlorothiazide (BENICAR HCT) 20-12.5 MG per tablet Take 1 tablet by mouth daily. 90 tablet 3   No current facility-administered medications on file prior to visit.    Review of Systems:  As per HPI, otherwise negative.    Physical Examination: Filed Vitals:   12/18/13 1436  BP: 133/86  Pulse: 91  Temp: 98.9 F (37.2 C)  Resp: 20   Filed Vitals:   12/18/13 1436  Height: 5\' 2"  (1.575 m)  Weight: 262 lb 2 oz (118.899 kg)   Body mass index is 47.93 kg/(m^2). Ideal Body Weight: Weight in (lb) to have BMI = 25: 136.4   GEN: obese, NAD, Non-toxic, Alert & Oriented x 3 dry HEENT: Atraumatic, Normocephalic.  Ears and Nose: No external deformity. EXTR:  No clubbing/cyanosis/edema NEURO: Normal gait.  PSYCH: Normally interactive. Conversant. Not depressed or anxious appearing.  Calm demeanor.    Assessment and Plan: Diarrhea Hypertension Change to lisinopril Imodium [facility]  Signed,  Ellison Carwin, MD

## 2013-12-21 LAB — OVA AND PARASITE EXAMINATION: OP: NONE SEEN

## 2013-12-24 LAB — STOOL CULTURE

## 2014-01-21 ENCOUNTER — Ambulatory Visit (INDEPENDENT_AMBULATORY_CARE_PROVIDER_SITE_OTHER): Payer: 59 | Admitting: Family Medicine

## 2014-01-21 ENCOUNTER — Ambulatory Visit (INDEPENDENT_AMBULATORY_CARE_PROVIDER_SITE_OTHER): Payer: 59

## 2014-01-21 VITALS — BP 108/70 | HR 82 | Temp 98.2°F | Resp 16 | Ht 63.0 in | Wt 258.2 lb

## 2014-01-21 DIAGNOSIS — I950 Idiopathic hypotension: Secondary | ICD-10-CM

## 2014-01-21 DIAGNOSIS — R0602 Shortness of breath: Secondary | ICD-10-CM

## 2014-01-21 DIAGNOSIS — R0789 Other chest pain: Secondary | ICD-10-CM

## 2014-01-21 DIAGNOSIS — I10 Essential (primary) hypertension: Secondary | ICD-10-CM

## 2014-01-21 LAB — CBC
HCT: 39 % (ref 36.0–46.0)
Hemoglobin: 12.9 g/dL (ref 12.0–15.0)
MCH: 26.4 pg (ref 26.0–34.0)
MCHC: 33.1 g/dL (ref 30.0–36.0)
MCV: 79.9 fL (ref 78.0–100.0)
MPV: 10.2 fL (ref 9.4–12.4)
Platelets: 436 10*3/uL — ABNORMAL HIGH (ref 150–400)
RBC: 4.88 MIL/uL (ref 3.87–5.11)
RDW: 14.7 % (ref 11.5–15.5)
WBC: 10 10*3/uL (ref 4.0–10.5)

## 2014-01-21 LAB — COMPREHENSIVE METABOLIC PANEL
ALT: 30 U/L (ref 0–35)
AST: 25 U/L (ref 0–37)
Albumin: 4 g/dL (ref 3.5–5.2)
Alkaline Phosphatase: 99 U/L (ref 39–117)
BUN: 10 mg/dL (ref 6–23)
CO2: 31 mEq/L (ref 19–32)
Calcium: 9.5 mg/dL (ref 8.4–10.5)
Chloride: 102 mEq/L (ref 96–112)
Creat: 0.7 mg/dL (ref 0.50–1.10)
Glucose, Bld: 105 mg/dL — ABNORMAL HIGH (ref 70–99)
Potassium: 3.6 mEq/L (ref 3.5–5.3)
Sodium: 140 mEq/L (ref 135–145)
Total Bilirubin: 0.8 mg/dL (ref 0.2–1.2)
Total Protein: 7.4 g/dL (ref 6.0–8.3)

## 2014-01-21 LAB — HEMOGLOBIN A1C
Hgb A1c MFr Bld: 5.7 % — ABNORMAL HIGH (ref <5.7)
Mean Plasma Glucose: 117 mg/dL — ABNORMAL HIGH (ref <117)

## 2014-01-21 LAB — D-DIMER, QUANTITATIVE (NOT AT ARMC): D-Dimer, Quant: 0.27 ug/mL-FEU (ref 0.00–0.48)

## 2014-01-21 LAB — POCT URINE PREGNANCY: Preg Test, Ur: NEGATIVE

## 2014-01-21 LAB — TROPONIN I: Troponin I: <0.01 ng/mL (ref <0.06)

## 2014-01-21 MED ORDER — OLMESARTAN MEDOXOMIL-HCTZ 20-12.5 MG PO TABS: 1.0000 | ORAL_TABLET | Freq: Every day | ORAL | Status: DC

## 2014-01-21 NOTE — Progress Notes (Signed)
[facility] [address], [place] [address] [phone]  Date:  01/21/2014   Name:  Mindy Collins   DOB:  January 17, 1971   MRN:  [id number]  PCP:  Roselee Culver, MD    Chief Complaint: low blood pressure; Fatigue; Leg Pain; and Shortness of Breath   History of Present Illness:  Mindy Collins is a 43 y.o. very pleasant female patient who presents with the following:  She is here today with concerns about her BP. She has been on benicar for about 13 years.   Over the last week or so she has noted leg pains, chest and arm pains and SOB.  She would feel "pains down there in my lower legs.  She would notice this at rest but not with walking. Also, she noted similar pains in her arms.  She has felt very tired and like it was hard to catch her breath.  Her CP "almost seemed to move" and could occur anywhere in her chest.  It might last for a few seconds.  Not associated with exercise.  Admits that she does not exercise much. She does not have any personal histor yof heart issues. However her GM died at age 54 due to some sort of heart problem.  Her GGM had heart attacks and died in her 44s.  Her own parents do not have heart issues.    She did have a stress about 3 years ago- I do not see a report but she was told it looked fine.    Her sx caused her to check her BP at home and she has noted it running lower than typical for her.  Last night she measured her BP at 113/77, P 90 (121/85, P 84 a couple of days ago- this is more typical of her readings) This am 101/70, P 84 and 107/75, P 77 recheck Last night was the first time she noted that her BP was running low.   She just changed over from Benicar to lisinopril/hctz.  She just took her first prinzide yesterday. She had wanted to change to lisinopril because she was worried about GI effects of benicar    She is not having CP now.  It has been intermittent.    Patient Active Problem List   Diagnosis Date Noted  .  Leukocytosis 11/14/2013  . Morbid obesity 08/21/2013  . Sebaceous cyst of labia 04/02/2012  . IBS (irritable bowel syndrome) 09/30/2011  . CHEST PAIN UNSPECIFIED 04/05/2010  . HYPERTENSION, BENIGN 04/05/2010    Past Medical History  Diagnosis Date  . HPV (human papilloma virus) infection     WARTS/ TCA [place]  . Dysmenorrhea   . Hypertension   . Obesity   . GERD (gastroesophageal reflux disease)   . Seasonal allergies   . Anemia   . Fatty liver   . Colitis     Past Surgical History  Procedure Laterality Date  . Gynecologic cryosurgery  1993    DYSPLASIA CERVIX/   . Myomectomy abdominal approach  04/31/2007  . Myomectomy  02/17/2011    Procedure: MYOMECTOMY;  Surgeon: Terrance Mass, MD;  Location: [facility];  Service: Gynecology;  Laterality: N/A;  I am hoping to get a 7:30am time on Dec 4, Dec 11 or Dec 12. It not available I will check on some 1:00 pm times. Thanks  . Ovarian cyst removal  02/17/2011    Procedure: OVARIAN CYSTECTOMY;  Surgeon: Terrance Mass, MD;  Location: [facility];  Service: Gynecology;  Laterality: Left;    History  Substance Use Topics  . Smoking status: Never Smoker   . Smokeless tobacco: Never Used     Comment: denies tobacco   . Alcohol Use: No    Family History  Problem Relation Age of Onset  . Hypertension Mother   . Heart disease Maternal Grandmother   . Cancer Father     LUNG  . Breast cancer Maternal Aunt   . Diabetes Maternal Grandfather     Allergies  Allergen Reactions  . Esomeprazole Magnesium Shortness Of Breath    Nervousness; tolerates Aciphex.  . Adhesive [Tape] Itching and Rash    Itching and rash with adhesive tape  . Latex Itching and Dermatitis    Skin blistering    Medication list has been reviewed and updated.  Current Outpatient Prescriptions on File Prior to Visit  Medication Sig Dispense Refill  . acetaminophen-codeine (TYLENOL #3) 300-30 MG per tablet Take 1-2 tablets by mouth every 4 (four) hours as needed. 30  tablet 0  . cyclobenzaprine (FLEXERIL) 10 MG tablet Take 10 mg by mouth 3 (three) times daily as needed for muscle spasms.    Marland Kitchen lisinopril-hydrochlorothiazide (ZESTORETIC) 20-12.5 MG per tablet Take 1 tablet by mouth daily. 90 tablet 3  . loperamide (IMODIUM A-D) 2 MG tablet 2 now and one hourly prn diarrhea.  Max 8 tabs in 24 hours 30 tablet 0  . medroxyPROGESTERone (PROVERA) 10 MG tablet Take 10 mg by mouth daily as needed.     . Multiple Vitamin (MULTIVITAMIN) tablet Take 1 tablet by mouth daily.    . naproxen sodium (ANAPROX DS) 550 MG tablet Take 1 tablet (550 mg total) by mouth 2 (two) times daily with a meal. 40 tablet 0  . olmesartan-hydrochlorothiazide (BENICAR HCT) 20-12.5 MG per tablet Take 1 tablet by mouth daily. (Patient not taking: Reported on 01/21/2014) 90 tablet 3   No current facility-administered medications on file prior to visit.    Review of Systems:  As per HPI- otherwise negative. She still has menses but does not suspect pregnancy She has lost a few lbs without trying and is concerned about it  Wt Readings from Last 3 Encounters:  01/21/14 258 lb 3.2 oz (117.119 kg)  12/18/13 262 lb 2 oz (118.899 kg)  12/15/13 264 lb 6.4 oz (119.931 kg)     Physical Examination: Filed Vitals:   01/21/14 1002  BP: 108/70  Pulse: 82  Temp: 98.2 F (36.8 C)  Resp: 16   Filed Vitals:   01/21/14 1002  Height: 5\' 3"  (1.6 m)  Weight: 258 lb 3.2 oz (117.119 kg)   Body mass index is 45.75 kg/(m^2). Ideal Body Weight: Weight in (lb) to have BMI = 25: 140.8  GEN: WDWN, NAD, Non-toxic, A & O x 3, obese, looks well HEENT: Atraumatic, Normocephalic. Neck supple. No masses, No LAD. Ears and Nose: No external deformity. CV: RRR, No M/G/R. No JVD. No thrill. No extra heart sounds. PULM: CTA B, no wheezes, crackles, rhonchi. No retractions. No resp. distress. No accessory muscle use. ABD: S, NT, ND, +BS. No rebound. No HSM. EXTR: No c/c/e.  No tenderness at this time.   NEURO  Normal gait.  PSYCH: Normally interactive. Conversant. Not depressed or anxious appearing.  Calm demeanor.   EKG: NSR, no significant change from prior tracings.    [facility] reading (PRIMARY) by  Dr. Lorelei Pont. CXR:  negative  CHEST 2 VIEW  COMPARISON: Prior chest  x-ray 01/04/2011  FINDINGS: The lungs are clear and negative for focal airspace consolidation, pulmonary edema or suspicious pulmonary nodule. No pleural effusion or pneumothorax. Cardiac and mediastinal contours are within normal limits. No acute fracture or lytic or blastic osseous lesions. The visualized upper abdominal bowel gas pattern is unremarkable.  IMPRESSION: Negative chest x-ray.  Results for orders placed or performed in visit on 01/21/14  POCT urine pregnancy  Result Value Ref Range   Preg Test, Ur Negative     Assessment and Plan: Other chest pain - Plan: Troponin I, EKG 12-Lead  SOB (shortness of breath) - Plan: D-dimer, quantitative, Troponin I, CBC, POCT urine pregnancy, EKG 12-Lead, DG Chest 2 View  Idiopathic hypotension - Plan: Comprehensive metabolic panel  Morbid obesity - Plan: Hemoglobin A1c  Essential hypertension - Plan: olmesartan-hydrochlorothiazide (BENICAR HCT) 20-12.5 MG per tablet   Reminded her that she needs to avoid pregnancy while she is on an ARB.  Discussed changing to just HCTZ but she does not want to do this now.  She hopes to lose weight and be able to stop her BP medication all together  Received her d dimer and troponin- both negative.  She is pleased to hear this news. She will await the rest of her labs and let me know if not feeling better over the next couple of days  Signed Lamar Blinks, MD

## 2014-01-21 NOTE — Patient Instructions (Signed)
I will be in touch with you later today with your results.  If your D dimer is up I will send you for a CT scan.   If your troponin is high we will need to have you go to the hospital. Let's have you go back on benicar- remember to avoid pregnancy while you are on this medication or on lisinopril!!

## 2014-02-20 ENCOUNTER — Telehealth: Payer: Self-pay

## 2014-02-20 DIAGNOSIS — I10 Essential (primary) hypertension: Secondary | ICD-10-CM

## 2014-02-20 NOTE — Telephone Encounter (Addendum)
Pt states the pharmacy called regarding her BENICAI HCT that they needed a prior authorization. Please call [place]

## 2014-02-20 NOTE — Telephone Encounter (Signed)
PA completed and pt notified of status. Verified w/pt that she has tried HCTZ 12.5 when first Dxd, then recently tried lisinopril HCT 20.12.5. Has been on Benicar since 01/09/07. Pending through covermymeds.

## 2014-02-22 MED ORDER — VALSARTAN-HYDROCHLOROTHIAZIDE 80-12.5 MG PO TABS
1.0000 | ORAL_TABLET | Freq: Every day | ORAL | Status: DC
Start: 2014-02-22 — End: 2014-02-23

## 2014-02-22 NOTE — Telephone Encounter (Signed)
PA denied for benicar bc pt has not tried/failed any preferred meds: Atacand, atacand HCT, Avapro, Avalide, Cozaar, Hyzaar,Exforge, Exforge HCT, Micardis, Micardis HCT, Twysnsta, Diovan HCT. Dr Lorelei Pont, do you want to Rx one of these for pt to try?

## 2014-02-22 NOTE — Telephone Encounter (Signed)
Called and LMOM.  I am going to change her to diovan hct- another ARB/ hctz combination pill for insurance reasons.  Call me if any concerns.  Otherwise please let me know how her BP does on the new medication  Meds ordered this encounter  Medications  . valsartan-hydrochlorothiazide (DIOVAN-HCT) 80-12.5 MG per tablet    Sig: Take 1 tablet by mouth daily.    Dispense:  90 tablet    Refill:  1

## 2014-02-23 ENCOUNTER — Ambulatory Visit (INDEPENDENT_AMBULATORY_CARE_PROVIDER_SITE_OTHER): Payer: BLUE CROSS/BLUE SHIELD | Admitting: Family Medicine

## 2014-02-23 VITALS — BP 122/78 | HR 83 | Temp 98.2°F | Resp 18 | Ht 62.75 in | Wt 258.2 lb

## 2014-02-23 DIAGNOSIS — I10 Essential (primary) hypertension: Secondary | ICD-10-CM

## 2014-02-23 MED ORDER — OLMESARTAN MEDOXOMIL-HCTZ 20-12.5 MG PO TABS: 1.0000 | ORAL_TABLET | Freq: Every day | ORAL | Status: DC

## 2014-02-23 NOTE — Patient Instructions (Signed)
Use either the benicar or diovan HCT- whichever you decide is fine.  Let me know if you have any other concerns and let's plan to recheck in 4 months or so.

## 2014-02-23 NOTE — Progress Notes (Signed)
[facility] [address], [place] [address] [phone]  Date:  02/23/2014   Name:  Mindy Collins   DOB:  1970/02/26   MRN:  [id number]  PCP:  Roselee Culver, MD    Chief Complaint: Medication Management   History of Present Illness:  Mindy Collins is a 44 y.o. very pleasant female patient who presents with the following:  We recently had to change her from benicar HCT to diovan HCT due to insurance reasons.  She has been on benicar for a long time with good results  She also was recently dx with CTS in both wrists- she had nerve conduction studies and will see ortho tomorrow to discuss the next step.   She would rather pay cash for her benicar  Patient Active Problem List   Diagnosis Date Noted  . Leukocytosis 11/14/2013  . Morbid obesity 08/21/2013  . Sebaceous cyst of labia 04/02/2012  . IBS (irritable bowel syndrome) 09/30/2011  . CHEST PAIN UNSPECIFIED 04/05/2010  . HYPERTENSION, BENIGN 04/05/2010    Past Medical History  Diagnosis Date  . HPV (human papilloma virus) infection     WARTS/ TCA [place]  . Dysmenorrhea   . Hypertension   . Obesity   . GERD (gastroesophageal reflux disease)   . Seasonal allergies   . Anemia   . Fatty liver   . Colitis   . Carpal tunnel syndrome, bilateral     Past Surgical History  Procedure Laterality Date  . Gynecologic cryosurgery  1993    DYSPLASIA CERVIX/   . Myomectomy abdominal approach  04/31/2007  . Myomectomy  02/17/2011    Procedure: MYOMECTOMY;  Surgeon: Terrance Mass, MD;  Location: [facility];  Service: Gynecology;  Laterality: N/A;  I am hoping to get a 7:30am time on Dec 4, Dec 11 or Dec 12. It not available I will check on some 1:00 pm times. Thanks  . Ovarian cyst removal  02/17/2011    Procedure: OVARIAN CYSTECTOMY;  Surgeon: Terrance Mass, MD;  Location: [facility];  Service: Gynecology;  Laterality: Left;    History  Substance Use Topics  . Smoking status: Never Smoker   . Smokeless  tobacco: Never Used     Comment: denies tobacco   . Alcohol Use: No    Family History  Problem Relation Age of Onset  . Hypertension Mother   . Heart disease Maternal Grandmother   . Cancer Father     LUNG  . Breast cancer Maternal Aunt   . Diabetes Maternal Grandfather     Allergies  Allergen Reactions  . Esomeprazole Magnesium Shortness Of Breath    Nervousness; tolerates Aciphex.  . Adhesive [Tape] Itching and Rash    Itching and rash with adhesive tape  . Latex Itching and Dermatitis    Skin blistering    Medication list has been reviewed and updated.  Current Outpatient Prescriptions on File Prior to Visit  Medication Sig Dispense Refill  . Multiple Vitamin (MULTIVITAMIN) tablet Take 1 tablet by mouth daily.    Marland Kitchen acetaminophen-codeine (TYLENOL #3) 300-30 MG per tablet Take 1-2 tablets by mouth every 4 (four) hours as needed. (Patient not taking: Reported on 02/23/2014) 30 tablet 0  . cyclobenzaprine (FLEXERIL) 10 MG tablet Take 10 mg by mouth 3 (three) times daily as needed for muscle spasms.    . medroxyPROGESTERone (PROVERA) 10 MG tablet Take 10 mg by mouth daily as needed.     . naproxen sodium (ANAPROX  DS) 550 MG tablet Take 1 tablet (550 mg total) by mouth 2 (two) times daily with a meal. (Patient not taking: Reported on 02/23/2014) 40 tablet 0  . valsartan-hydrochlorothiazide (DIOVAN-HCT) 80-12.5 MG per tablet Take 1 tablet by mouth daily. (Patient not taking: Reported on 02/23/2014) 90 tablet 1   No current facility-administered medications on file prior to visit.    Review of Systems:  As per HPI- otherwise negative.   Physical Examination: Filed Vitals:   02/23/14 1157  BP: 122/78  Pulse: 83  Temp: 98.2 F (36.8 C)  Resp: 18   Filed Vitals:   02/23/14 1157  Height: 5' 2.75" (1.594 m)  Weight: 258 lb 3.2 oz (117.119 kg)   Body mass index is 46.09 kg/(m^2). Ideal Body Weight: Weight in (lb) to have BMI = 25: 139.7  GEN: WDWN, NAD, Non-toxic, A & O  x 3, obese, looks well HEENT: Atraumatic, Normocephalic. Neck supple. No masses, No LAD. Ears and Nose: No external deformity. CV: RRR, No M/G/R. No JVD. No thrill. No extra heart sounds. PULM: CTA B, no wheezes, crackles, rhonchi. No retractions. No resp. distress. No accessory muscle use. EXTR: No c/c/e NEURO Normal gait.  PSYCH: Normally interactive. Conversant. Not depressed or anxious appearing.  Calm demeanor.    Assessment and Plan: Essential hypertension - Plan: olmesartan-hydrochlorothiazide (BENICAR HCT) 20-12.5 MG per tablet  She thinks she might want to pay out of pocket for benicar as she is used to this- she will see what the price is and then decide.  Let her know that I think either medication will be fine.  Reminded that she should not become pregnant while on an ARB medication  Signed Lamar Blinks, MD

## 2014-03-11 ENCOUNTER — Ambulatory Visit (INDEPENDENT_AMBULATORY_CARE_PROVIDER_SITE_OTHER): Payer: BLUE CROSS/BLUE SHIELD

## 2014-03-11 ENCOUNTER — Ambulatory Visit (INDEPENDENT_AMBULATORY_CARE_PROVIDER_SITE_OTHER): Payer: BLUE CROSS/BLUE SHIELD | Admitting: Family Medicine

## 2014-03-11 VITALS — BP 116/74 | HR 85 | Temp 97.5°F | Resp 16 | Ht 64.0 in | Wt 257.0 lb

## 2014-03-11 DIAGNOSIS — M545 Low back pain, unspecified: Secondary | ICD-10-CM

## 2014-03-11 DIAGNOSIS — S39012A Strain of muscle, fascia and tendon of lower back, initial encounter: Secondary | ICD-10-CM

## 2014-03-11 DIAGNOSIS — K219 Gastro-esophageal reflux disease without esophagitis: Secondary | ICD-10-CM

## 2014-03-11 DIAGNOSIS — K5900 Constipation, unspecified: Secondary | ICD-10-CM

## 2014-03-11 MED ORDER — RANITIDINE HCL 150 MG PO TABS: 150.0000 mg | ORAL_TABLET | Freq: Two times a day (BID) | ORAL | Status: DC

## 2014-03-11 MED ORDER — HYDROCODONE-ACETAMINOPHEN 5-325 MG PO TABS: 1.0000 | ORAL_TABLET | Freq: Four times a day (QID) | ORAL | Status: DC | PRN

## 2014-03-11 MED ORDER — PREDNISONE 20 MG PO TABS: ORAL_TABLET | ORAL | Status: DC

## 2014-03-11 MED ORDER — DICLOFENAC SODIUM 75 MG PO TBEC: 75.0000 mg | DELAYED_RELEASE_TABLET | Freq: Two times a day (BID) | ORAL | Status: DC | PRN

## 2014-03-11 MED ORDER — CYCLOBENZAPRINE HCL 10 MG PO TABS: 10.0000 mg | ORAL_TABLET | Freq: Three times a day (TID) | ORAL | Status: DC | PRN

## 2014-03-11 NOTE — Patient Instructions (Signed)

## 2014-03-11 NOTE — Progress Notes (Addendum)
Subjective:  This chart was scribed for Mindy Cheadle, MD by [name], Medical Scribe. This patient was seen in room Rm 3 and the patient's care was started at 2:00 PM.   Patient ID: Mindy Collins, female    DOB: [birth year], 44 y.o.   MRN: [id number] Chief Complaint  Patient presents with  . Back Pain    HPI HPI Comments: Mindy Collins is a 44 y.o. female who was seen at [facility] two weeks ago.  She had bilateral carpal tunnel syndrome for which she has seen orthopaedics.  Her insurance would not bay for benicar  HCT so she was switched to Tovey HCT which appeared to be working well, but patient wanted to pay out of pocket to say with Benicar.  Either mediation is fine.   She was last seen for back pain 3 months ago by her PCP Dr. Ouida Sills.  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  Previously treated with Tylenol 3 and miralax.  She has not had any imaging of her low back.   She was referred to PT by orthopaedics for low back pain with sciatica 2 years ago.    She presents to [facility] complaining of an aggravation of her chronic lower back pain onset today after sneezing.  She reports the pain is typically left-sided but is currently right-sided and described as crampy.   Patient reports some associated minor, bilaterally equal leg weakness due to pain.  Patient has taken cyclobenzaprine, 2 Tylenol # 3's for today's complaints which have afforded some relief.. Patient denies current use of naproxen.  She reports that she has had a recurrence of episodes similar to today 6 months ago, 3 months ago and on 02/02/2014.  During the proximate previous episode, she took muscle relaxants and Tylenol # 3 which returned her back to base line.  She was seen by a chiropractor for her back pain and carpal tunnel 9 days ago during which time she had a neck and back adjustment.  Patient denies bowel/bladder inctontinence, leg numbness, n/v, abdominal pain, fever, chills.      She also reports a history of carpal tunnel.  Patient reports she has taken Mobic and used a wrist splint which has afforded some relief.    She also notes increased increased gastro esophogeal reflux and nausea upon waking when she was taking Mobic nightly.  Some constipation relieved by Miralax.    Past Medical History  Diagnosis Date  . HPV (human papilloma virus) infection     WARTS/ TCA [place]  . Dysmenorrhea   . Hypertension   . Obesity   . GERD (gastroesophageal reflux disease)   . Seasonal allergies   . Anemia   . Fatty liver   . Colitis   . Carpal tunnel syndrome, bilateral    Current Outpatient Prescriptions on File Prior to Visit  Medication Sig Dispense Refill  . acetaminophen-codeine (TYLENOL #3) 300-30 MG per tablet Take 1-2 tablets by mouth every 4 (four) hours as needed. 30 tablet 0  . cyclobenzaprine (FLEXERIL) 10 MG tablet Take 10 mg by mouth 3 (three) times daily as needed for muscle spasms.    . Multiple Vitamin (MULTIVITAMIN) tablet Take 1 tablet by mouth daily.    Marland Kitchen olmesartan-hydrochlorothiazide (BENICAR HCT) 20-12.5 MG per tablet Take 1 tablet by mouth daily. 30 tablet 6  . pyridoxine (B-6) 500 MG tablet Take 600 mg by mouth daily.    . medroxyPROGESTERone (PROVERA) 10 MG tablet  Take 10 mg by mouth daily as needed.     . meloxicam (MOBIC) 15 MG tablet Take 15 mg by mouth daily.    . naproxen sodium (ANAPROX DS) 550 MG tablet Take 1 tablet (550 mg total) by mouth 2 (two) times daily with a meal. (Patient not taking: Reported on 02/23/2014) 40 tablet 0   No current facility-administered medications on file prior to visit.   Allergies  Allergen Reactions  . Esomeprazole Magnesium Shortness Of Breath    Nervousness; tolerates Aciphex.  . Adhesive [Tape] Itching and Rash    Itching and rash with adhesive tape  . Latex Itching and Dermatitis    Skin blistering     Review of Systems  Constitutional: Negative for fever and chills.  HENT: Positive for  sneezing.   Gastrointestinal: Positive for nausea and constipation. Negative for vomiting, abdominal pain and diarrhea.  Musculoskeletal: Positive for back pain.       Objective:  BP 116/74 mmHg  Pulse 85  Temp(Src) 97.5 F (36.4 C) (Oral)  Resp 16  Ht 5\' 4"  (1.626 m)  Wt 257 lb (116.574 kg)  BMI 44.09 kg/m2  SpO2 98%  LMP 02/13/2014  Physical Exam  Constitutional: She is oriented to person, place, and time. She appears well-developed and well-nourished. No distress.  HENT:  Head: Normocephalic and atraumatic.  Eyes: Conjunctivae and EOM are normal.  Neck: Neck supple. No tracheal deviation present.  Cardiovascular: Normal rate and regular rhythm.   Pulmonary/Chest: Effort normal. No respiratory distress.  Lungs CTA  Musculoskeletal: Normal range of motion.  No point tenderness over lumbar spine or paraspinous muscles.  Back flexion about 70 degrees. No extension or rotation.  Mild ability to laterally flex.  2+ patellar and achilles reflexes.  Negative seated SLR bilaterally.  Strength 5/5 bilaterally although certainly limited by pain.    Neurological: She is alert and oriented to person, place, and time.  Skin: Skin is warm and dry.  Psychiatric: She has a normal mood and affect. Her behavior is normal.  Nursing note and vitals reviewed.  Primary x-ray reading by Dr. Brigitte Pulse: No acute abnormalities of lower back     Assessment & Plan:  2:15 PM   GERD: Patient advised that Zantac or Pepcid may be used for gastro esophogeal reflux.    Constipation - Patient advised to continue use of Miralax as needed for constipation.   Bilateral low back pain without sciatica - Plan: DG Lumbar Spine Complete - Patient offered IM toradol but declined.  Will treat with pain medication and prednisone.    Low back strain, initial encounter - Plan: Ambulatory referral to Physical Therapy  Meds ordered this encounter  Medications  . cyclobenzaprine (FLEXERIL) 10 MG tablet    Sig: Take 1  tablet (10 mg total) by mouth 3 (three) times daily as needed for muscle spasms.    Dispense:  30 tablet    Refill:  1  . predniSONE (DELTASONE) 20 MG tablet    Sig: Take 3 tabs po qd x 3d, then 2 tabs po qd x 3d, then 1 tab po qd x 3d, then 1/2 tab po qd x 4d, then stop    Dispense:  20 tablet    Refill:  0  . ranitidine (ZANTAC) 150 MG tablet    Sig: Take 1 tablet (150 mg total) by mouth 2 (two) times daily.    Dispense:  60 tablet    Refill:  0  . diclofenac (VOLTAREN) 75 MG EC  tablet    Sig: Take 1 tablet (75 mg total) by mouth 2 (two) times daily as needed.    Dispense:  30 tablet    Refill:  1    Do not start until after prednisone course is complete.  Marland Kitchen DISCONTD: HYDROcodone-acetaminophen (NORCO/VICODIN) 5-325 MG per tablet    Sig: Take 1 tablet by mouth every 6 (six) hours as needed for moderate pain.    Dispense:  30 tablet    Refill:  0  . HYDROcodone-acetaminophen (NORCO/VICODIN) 5-325 MG per tablet    Sig: Take 1 tablet by mouth every 6 (six) hours as needed for moderate pain.    Dispense:  30 tablet    Refill:  0    I personally performed the services described in this documentation, which was scribed in my presence. The recorded information has been reviewed and considered, and addended by me as needed.  Mindy Cheadle, MD MPH

## 2014-03-22 ENCOUNTER — Ambulatory Visit: Payer: Self-pay | Admitting: Physical Therapy

## 2014-03-28 ENCOUNTER — Encounter: Payer: Self-pay | Admitting: Family Medicine

## 2014-03-28 ENCOUNTER — Ambulatory Visit (INDEPENDENT_AMBULATORY_CARE_PROVIDER_SITE_OTHER): Payer: BLUE CROSS/BLUE SHIELD | Admitting: Family Medicine

## 2014-03-28 VITALS — BP 108/80 | HR 107 | Temp 98.3°F | Resp 16 | Ht 63.0 in | Wt 250.0 lb

## 2014-03-28 DIAGNOSIS — K5900 Constipation, unspecified: Secondary | ICD-10-CM

## 2014-03-28 DIAGNOSIS — R109 Unspecified abdominal pain: Secondary | ICD-10-CM | POA: Diagnosis not present

## 2014-03-28 DIAGNOSIS — R197 Diarrhea, unspecified: Secondary | ICD-10-CM | POA: Diagnosis not present

## 2014-03-28 DIAGNOSIS — K589 Irritable bowel syndrome without diarrhea: Secondary | ICD-10-CM | POA: Diagnosis not present

## 2014-03-28 LAB — POCT CBC
Granulocyte percent: 75.9 %G (ref 37–80)
HCT, POC: 45 % (ref 37.7–47.9)
Hemoglobin: 13.9 g/dL (ref 12.2–16.2)
Lymph, poc: 1.9 (ref 0.6–3.4)
MCH, POC: 26.4 pg — AB (ref 27–31.2)
MCHC: 30.9 g/dL — AB (ref 31.8–35.4)
MCV: 85.3 fL (ref 80–97)
MID (cbc): 0.5 (ref 0–0.9)
MPV: 7.6 fL (ref 0–99.8)
POC Granulocyte: 7.4 — AB (ref 2–6.9)
POC LYMPH PERCENT: 19.4 %L (ref 10–50)
POC MID %: 4.7 %M (ref 0–12)
Platelet Count, POC: 357 10*3/uL (ref 142–424)
RBC: 5.28 M/uL (ref 4.04–5.48)
RDW, POC: 15.7 %
WBC: 9.8 10*3/uL (ref 4.6–10.2)

## 2014-03-28 LAB — POCT UA - MICROSCOPIC ONLY
Bacteria, U Microscopic: NEGATIVE
Casts, Ur, LPF, POC: NEGATIVE
Crystals, Ur, HPF, POC: NEGATIVE
Mucus, UA: NEGATIVE
Yeast, UA: NEGATIVE

## 2014-03-28 LAB — POCT URINALYSIS DIPSTICK
Bilirubin, UA: NEGATIVE
Glucose, UA: NEGATIVE
Ketones, UA: 15
Nitrite, UA: NEGATIVE
Protein, UA: NEGATIVE
Spec Grav, UA: 1.01
Urobilinogen, UA: 0.2
pH, UA: 6

## 2014-03-28 LAB — IFOBT (OCCULT BLOOD): IFOBT: NEGATIVE

## 2014-03-28 MED ORDER — RABEPRAZOLE SODIUM 20 MG PO TBEC: 20.0000 mg | DELAYED_RELEASE_TABLET | Freq: Every day | ORAL | Status: DC

## 2014-03-28 NOTE — Patient Instructions (Signed)
Try glycerin suppositories if stool feels large or painful. Or try a fleets enema.  Recommend taking miralax daily along with colace.   Start on aciphex daily x 6-8 weeks then can switch back to as needed zantac.  Constipation Constipation is when a person has fewer than three bowel movements a week, has difficulty having a bowel movement, or has stools that are dry, hard, or larger than normal. As people grow older, constipation is more common. If you try to fix constipation with medicines that make you have a bowel movement (laxatives), the problem may get worse. Long-term laxative use may cause the muscles of the colon to become weak. A low-fiber diet, not taking in enough fluids, and taking certain medicines may make constipation worse.  CAUSES   Certain medicines, such as antidepressants, pain medicine, iron supplements, antacids, and water pills.   Certain diseases, such as diabetes, irritable bowel syndrome (IBS), thyroid disease, or depression.   Not drinking enough water.   Not eating enough fiber-rich foods.   Stress or travel.   Lack of physical activity or exercise.   Ignoring the urge to have a bowel movement.   Using laxatives too much.  SIGNS AND SYMPTOMS   Having fewer than three bowel movements a week.   Straining to have a bowel movement.   Having stools that are hard, dry, or larger than normal.   Feeling full or bloated.   Pain in the lower abdomen.   Not feeling relief after having a bowel movement.  DIAGNOSIS  Your health care provider will take a medical history and perform a physical exam. Further testing may be done for severe constipation. Some tests may include:  A barium enema X-ray to examine your rectum, colon, and, sometimes, your small intestine.   A sigmoidoscopy to examine your lower colon.   A colonoscopy to examine your entire colon. TREATMENT  Treatment will depend on the severity of your constipation and what is causing  it. Some dietary treatments include drinking more fluids and eating more fiber-rich foods. Lifestyle treatments may include regular exercise. If these diet and lifestyle recommendations do not help, your health care provider may recommend taking over-the-counter laxative medicines to help you have bowel movements. Prescription medicines may be prescribed if over-the-counter medicines do not work.  HOME CARE INSTRUCTIONS   Eat foods that have a lot of fiber, such as fruits, vegetables, whole grains, and beans.  Limit foods high in fat and processed sugars, such as french fries, hamburgers, cookies, candies, and soda.   A fiber supplement may be added to your diet if you cannot get enough fiber from foods.   Drink enough fluids to keep your urine clear or pale yellow.   Exercise regularly or as directed by your health care provider.   Go to the restroom when you have the urge to go. Do not hold it.   Only take over-the-counter or prescription medicines as directed by your health care provider. Do not take other medicines for constipation without talking to your health care provider first.  [place] IF:   You have bright red blood in your stool.   Your constipation lasts for more than 4 days or gets worse.   You have abdominal or rectal pain.   You have thin, pencil-like stools.   You have unexplained weight loss. MAKE SURE YOU:   Understand these instructions.  Will watch your condition.  Will get help right away if you are not doing well  or get worse. Document Released: 10/26/2003 Document Revised: 02/01/2013 Document Reviewed: 11/08/2012 [facility] Patient Information 2015 [place], [place]. This information is not intended to replace advice given to you by your health care provider. Make sure you discuss any questions you have with your health care provider. Food Choices for Gastroesophageal Reflux Disease When you have gastroesophageal reflux disease  (GERD), the foods you eat and your eating habits are very important. Choosing the right foods can help ease the discomfort of GERD. WHAT GENERAL GUIDELINES DO I NEED TO FOLLOW?  Choose fruits, vegetables, whole grains, low-fat dairy products, and low-fat meat, fish, and poultry.  Limit fats such as oils, salad dressings, butter, nuts, and avocado.  Keep a food diary to identify foods that cause symptoms.  Avoid foods that cause reflux. These may be different for different people.  Eat frequent small meals instead of three large meals each day.  Eat your meals slowly, in a relaxed setting.  Limit fried foods.  Cook foods using methods other than frying.  Avoid drinking alcohol.  Avoid drinking large amounts of liquids with your meals.  Avoid bending over or lying down until 2-3 hours after eating. WHAT FOODS ARE NOT RECOMMENDED? The following are some foods and drinks that may worsen your symptoms: Vegetables Tomatoes. Tomato juice. Tomato and spaghetti sauce. Chili peppers. Onion and garlic. Horseradish. Fruits Oranges, grapefruit, and lemon (fruit and juice). Meats High-fat meats, fish, and poultry. This includes hot dogs, ribs, ham, sausage, salami, and bacon. Dairy Whole milk and chocolate milk. Sour cream. Cream. Butter. Ice cream. Cream cheese.  Beverages Coffee and tea, with or without caffeine. Carbonated beverages or energy drinks. Condiments Hot sauce. Barbecue sauce.  Sweets/Desserts Chocolate and cocoa. Donuts. Peppermint and spearmint. Fats and Oils High-fat foods, including [place] fries and potato chips. Other Vinegar. Strong spices, such as black pepper, white pepper, red pepper, cayenne, curry powder, cloves, ginger, and chili powder. The items listed above may not be a complete list of foods and beverages to avoid. Contact your dietitian for more information. Document Released: 01/27/2005 Document Revised: 02/01/2013 Document Reviewed:  12/01/2012 [facility] Patient Information 2015 [place], [place]. This information is not intended to replace advice given to you by your health care provider. Make sure you discuss any questions you have with your health care provider. Gastroesophageal Reflux Disease, Adult Gastroesophageal reflux disease (GERD) happens when acid from your stomach flows up into the esophagus. When acid comes in contact with the esophagus, the acid causes soreness (inflammation) in the esophagus. Over time, GERD may create small holes (ulcers) in the lining of the esophagus. CAUSES   Increased body weight. This puts pressure on the stomach, making acid rise from the stomach into the esophagus.  Smoking. This increases acid production in the stomach.  Drinking alcohol. This causes decreased pressure in the lower esophageal sphincter (valve or ring of muscle between the esophagus and stomach), allowing acid from the stomach into the esophagus.  Late evening meals and a full stomach. This increases pressure and acid production in the stomach.  A malformed lower esophageal sphincter. Sometimes, no cause is found. SYMPTOMS   Burning pain in the lower part of the mid-chest behind the breastbone and in the mid-stomach area. This may occur twice a week or more often.  Trouble swallowing.  Sore throat.  Dry cough.  Asthma-like symptoms including chest tightness, shortness of breath, or wheezing. DIAGNOSIS  Your caregiver may be able to diagnose GERD based on your symptoms. In some cases, X-rays  and other tests may be done to check for complications or to check the condition of your stomach and esophagus. TREATMENT  Your caregiver may recommend over-the-counter or prescription medicines to help decrease acid production. Ask your caregiver before starting or adding any new medicines.  HOME CARE INSTRUCTIONS   Change the factors that you can control. Ask your caregiver for guidance concerning weight loss, quitting  smoking, and alcohol consumption.  Avoid foods and drinks that make your symptoms worse, such as:  Caffeine or alcoholic drinks.  Chocolate.  Peppermint or mint flavorings.  Garlic and onions.  Spicy foods.  Citrus fruits, such as oranges, lemons, or limes.  Tomato-based foods such as sauce, chili, salsa, and pizza.  Fried and fatty foods.  Avoid lying down for the 3 hours prior to your bedtime or prior to taking a nap.  Eat small, frequent meals instead of large meals.  Wear loose-fitting clothing. Do not wear anything tight around your waist that causes pressure on your stomach.  Raise the head of your bed 6 to 8 inches with wood blocks to help you sleep. Extra pillows will not help.  Only take over-the-counter or prescription medicines for pain, discomfort, or fever as directed by your caregiver.  Do not take aspirin, ibuprofen, or other nonsteroidal anti-inflammatory drugs (NSAIDs). SEEK IMMEDIATE MEDICAL CARE IF:   You have pain in your arms, neck, jaw, teeth, or back.  Your pain increases or changes in intensity or duration.  You develop nausea, vomiting, or sweating (diaphoresis).  You develop shortness of breath, or you faint.  Your vomit is green, yellow, black, or looks like coffee grounds or blood.  Your stool is red, bloody, or black. These symptoms could be signs of other problems, such as heart disease, gastric bleeding, or esophageal bleeding. MAKE SURE YOU:   Understand these instructions.  Will watch your condition.  Will get help right away if you are not doing well or get worse. Document Released: 11/06/2004 Document Revised: 04/21/2011 Document Reviewed: 08/16/2010 [facility] Patient Information 2015 [place], [place]. This information is not intended to replace advice given to you by your health care provider. Make sure you discuss any questions you have with your health care provider.

## 2014-03-28 NOTE — Progress Notes (Signed)
Subjective:    Patient ID: Mindy Collins, female    DOB: [birth year], 44 y.o.   MRN: [id number] This chart was scribed for [name], MD by Zola Button, Medical Scribe. This patient was seen in Room 8 and the patient's care was started at 10:44 AM.    Chief Complaint  Patient presents with  . Stomach issues    Epigastric area pain onset 4 days  . Constipation    X1week  . Loose stools    Onset today    HPI HPI Comments: Mindy Collins is a 44 y.o. female with a hx of stress-related colitis who presents to the [facility] complaining of constipation that started 6 days ago.  I saw her 2 weeks ago for constipation, GERD and back pain. In the past, her back pain had responded well to Tylenol #3 and cyclobenzaprine, as well as regular Mobic. I started her on prolonged prednisone taper followed by diclofenac. Recommended she try Zantac for her acid reflux and Miralax for her constipation.  Back Pain: Her back pain has improved since last visit.  Constipation: Patient started with constipation 6 days ago, and was unable to pass any stools except for a small pellet sized stool. She also reports having abdominal pain/discomfort that started 4 days ago and vomiting 3 days ago. This past Saturday, 3 days ago, she felt that nothing was going down her digestive tract after eating, then subsequently vomited. Since then, she has been drinking water and eating mainly soup while avoiding solid foods. In addition, she stopped her medications except for HTN medications and Miralax once a day for the past 2 days. Patient notes that her urine was discolored and was a honey color the past Friday and Saturday (4 days ago), but her urine has returned to a normal color since then. This morning, she had 4 bowel movements with loosely formed, soft stools - her stools were not watery. Patient has not used any enemas or suppositories. She is noted to have constipation regularly, but she states it is not  usually this bad. Patient states she has not seen her GI doctor in a while, and that she has not had a colonoscopy done. She states she was hospitalized in 2011 for stress-related colitis.  Past Medical History  Diagnosis Date  . HPV (human papilloma virus) infection     WARTS/ TCA [place]  . Dysmenorrhea   . Hypertension   . Obesity   . GERD (gastroesophageal reflux disease)   . Seasonal allergies   . Anemia   . Fatty liver   . Colitis   . Carpal tunnel syndrome, bilateral    Current Outpatient Prescriptions on File Prior to Visit  Medication Sig Dispense Refill  . olmesartan-hydrochlorothiazide (BENICAR HCT) 20-12.5 MG per tablet Take 1 tablet by mouth daily. 30 tablet 6  . pyridoxine (B-6) 500 MG tablet Take 600 mg by mouth daily.    . ranitidine (ZANTAC) 150 MG tablet Take 1 tablet (150 mg total) by mouth 2 (two) times daily. 60 tablet 0  . cyclobenzaprine (FLEXERIL) 10 MG tablet Take 1 tablet (10 mg total) by mouth 3 (three) times daily as needed for muscle spasms. (Patient not taking: Reported on 03/28/2014) 30 tablet 1  . diclofenac (VOLTAREN) 75 MG EC tablet Take 1 tablet (75 mg total) by mouth 2 (two) times daily as needed. (Patient not taking: Reported on 03/28/2014) 30 tablet 1  . medroxyPROGESTERone (PROVERA) 10 MG tablet Take 10 mg by  mouth daily as needed.     . Multiple Vitamin (MULTIVITAMIN) tablet Take 1 tablet by mouth daily.     No current facility-administered medications on file prior to visit.   Allergies  Allergen Reactions  . Esomeprazole Magnesium Shortness Of Breath    Nervousness; tolerates Aciphex.  . Adhesive [Tape] Itching and Rash    Itching and rash with adhesive tape  . Latex Itching and Dermatitis    Skin blistering    Review of Systems  Gastrointestinal: Positive for vomiting, abdominal pain, diarrhea and constipation.       Objective:  BP 108/80 mmHg  Pulse 107  Temp(Src) 98.3 F (36.8 C) (Oral)  Resp 16  Ht 5\' 3"  (1.6 m)  Wt 250 lb  (113.399 kg)  BMI 44.30 kg/m2  SpO2 98%  LMP 03/17/2014  Physical Exam  Constitutional: She is oriented to person, place, and time. She appears well-developed and well-nourished. No distress.  HENT:  Head: Normocephalic and atraumatic.  Mouth/Throat: Oropharynx is clear and moist. No oropharyngeal exudate.  Eyes: Pupils are equal, round, and reactive to light.  Neck: Neck supple.  Cardiovascular: Normal rate, regular rhythm, S1 normal, S2 normal and normal heart sounds.   No murmur heard. Pulmonary/Chest: Effort normal.  Abdominal: Soft. Bowel sounds are normal. She exhibits no mass. There is no tenderness.  No focal tenderness.  Genitourinary: Rectal exam shows no external hemorrhoid, no internal hemorrhoid and anal tone normal.  Normal rectal exam. No stool in vault.  Musculoskeletal: She exhibits no edema.  Neurological: She is alert and oriented to person, place, and time. No cranial nerve deficit.  Skin: Skin is warm and dry. No rash noted.  Psychiatric: She has a normal mood and affect. Her behavior is normal.  Vitals reviewed.   Results for orders placed or performed in visit on 03/28/14  POCT UA - Microscopic Only  Result Value Ref Range   WBC, Ur, HPF, POC 2-5    RBC, urine, microscopic 1-3    Bacteria, U Microscopic neg    Mucus, UA neg    Epithelial cells, urine per micros 1-5    Crystals, Ur, HPF, POC neg    Casts, Ur, LPF, POC neg    Yeast, UA neg   POCT urinalysis dipstick  Result Value Ref Range   Color, UA yellow    Clarity, UA clear    Glucose, UA neg    Bilirubin, UA neg    Ketones, UA 15    Spec Grav, UA 1.010    Blood, UA trace-intact    pH, UA 6.0    Protein, UA neg    Urobilinogen, UA 0.2    Nitrite, UA neg    Leukocytes, UA Trace   POCT CBC  Result Value Ref Range   WBC 9.8 4.6 - 10.2 K/uL   Lymph, poc 1.9 0.6 - 3.4   POC LYMPH PERCENT 19.4 10 - 50 %L   MID (cbc) 0.5 0 - 0.9   POC MID % 4.7 0 - 12 %M   POC Granulocyte 7.4 (A) 2 - 6.9    Granulocyte percent 75.9 37 - 80 %G   RBC 5.28 4.04 - 5.48 M/uL   Hemoglobin 13.9 12.2 - 16.2 g/dL   HCT, POC 45.0 37.7 - 47.9 %   MCV 85.3 80 - 97 fL   MCH, POC 26.4 (A) 27 - 31.2 pg   MCHC 30.9 (A) 31.8 - 35.4 g/dL   RDW, POC 15.7 %   Platelet  Count, POC 357 142 - 424 K/uL   MPV 7.6 0 - 99.8 fL  IFOBT POC (occult bld, rslt in office)  Result Value Ref Range   IFOBT Negative          Assessment & Plan:   IBS (irritable bowel syndrome) - Plan: IFOBT POC (occult bld, rslt in office)  Abdominal pain, unspecified abdominal location - Plan: POCT UA - Microscopic Only, POCT urinalysis dipstick - try aciphex x 6-8 wks then switch back to ranitidine if sxs improve.  Diarrhea - Plan: POCT CBC, IFOBT POC (occult bld, rslt in office) - concern for leakage of diarrhea around fecal impaction  Constipation, unspecified constipation type- rec glycerin suppositories vs fleets enema for acute impaction - then need to maintain w/ daily miralax and colace   Meds ordered this encounter  Medications  . RABEprazole (ACIPHEX) 20 MG tablet    Sig: Take 1 tablet (20 mg total) by mouth daily.    Dispense:  30 tablet    Refill:  2    I personally performed the services described in this documentation, which was scribed in my presence. The recorded information has been reviewed and considered, and addended by me as needed.  [name], MD MPH

## 2014-04-19 ENCOUNTER — Ambulatory Visit: Payer: BLUE CROSS/BLUE SHIELD | Admitting: Gynecology

## 2014-05-30 ENCOUNTER — Ambulatory Visit (INDEPENDENT_AMBULATORY_CARE_PROVIDER_SITE_OTHER): Payer: BLUE CROSS/BLUE SHIELD | Admitting: Family Medicine

## 2014-05-30 ENCOUNTER — Encounter: Payer: Self-pay | Admitting: Family Medicine

## 2014-05-30 VITALS — BP 128/90 | HR 96 | Temp 98.6°F | Resp 20 | Ht 62.0 in | Wt 252.8 lb

## 2014-05-30 DIAGNOSIS — M5441 Lumbago with sciatica, right side: Secondary | ICD-10-CM | POA: Diagnosis not present

## 2014-05-30 DIAGNOSIS — R5381 Other malaise: Secondary | ICD-10-CM | POA: Diagnosis not present

## 2014-05-30 DIAGNOSIS — M5442 Lumbago with sciatica, left side: Secondary | ICD-10-CM

## 2014-05-30 DIAGNOSIS — M549 Dorsalgia, unspecified: Secondary | ICD-10-CM | POA: Insufficient documentation

## 2014-05-30 LAB — COMPREHENSIVE METABOLIC PANEL
ALT: 23 U/L (ref 0–35)
AST: 22 U/L (ref 0–37)
Albumin: 4.1 g/dL (ref 3.5–5.2)
Alkaline Phosphatase: 96 U/L (ref 39–117)
BUN: 11 mg/dL (ref 6–23)
CO2: 27 mEq/L (ref 19–32)
Calcium: 9 mg/dL (ref 8.4–10.5)
Chloride: 101 mEq/L (ref 96–112)
Creat: 0.64 mg/dL (ref 0.50–1.10)
Glucose, Bld: 88 mg/dL (ref 70–99)
Potassium: 3.8 mEq/L (ref 3.5–5.3)
Sodium: 141 mEq/L (ref 135–145)
Total Bilirubin: 0.4 mg/dL (ref 0.2–1.2)
Total Protein: 7 g/dL (ref 6.0–8.3)

## 2014-05-30 NOTE — Progress Notes (Signed)
   Subjective:    Patient ID: Mindy Collins, female    DOB: 1970-03-10, 44 y.o.   MRN: [id number]  HPI 44 y/o african [place] female presents to office today because she thinks she might have taken 3 Meloxicam 15mg  within a 14 hour period. She took 1st Meloxicam yesterday around 4:30 pm for back pain. She was still not getting any relief so she took what she thought was hydrocodone, hydrocodone she can take 1 every 6 hours as needed, but thinks it was Meloxicam, around 11:30 pm. This am around 6:30 or so she was still hurting so she took another of what she thought to be hydrocodone, but she thinks was Meloxicam. Since they both "look similar" she thinks she took meloxicam all 3 times instead of the one time yesterday. Around 8 am this morning she felt very tired and kind of lethargic. As the morning continued she felt a heaviness and pain in her chest. She has had episodes of dizziness and sweating throughout the morning.   Patient is getting a masters in [place]. She plans on going up to [facility] [place] in the next couple days for conference She also has a part-time job.   Review of Systems No nausea or vomiting, no fever, no loss of consciousness, no cough no sore throat or throat swelling    Objective:   Physical Exam Obese legs woman in no acute distress Chest: Clear Heart: Regular no murmur Abdomen: Protuberant, no guarding or rebound Skin: No rash HEENT: Unremarkable without icterus    Assessment & Plan:   This chart was scribed in my presence and reviewed by me personally.    ICD-9-CM ICD-10-CM   1. Bilateral low back pain with sciatica, sciatica laterality unspecified 724.3 M54.41 Comprehensive metabolic panel    X10.62   2. Malaise 780.79 R53.81   3. Morbid obesity 278.01 E66.01 Amb ref to Medical Nutrition Therapy-MNT   We discussed her obesity problem.  Signed, Robyn Haber, MD

## 2014-05-30 NOTE — Patient Instructions (Addendum)
To lose weight:   1. Avoid sodas, both diet and non-diet 2. Limit carbohydrates to one portion per meal (bread, pasta, potatoes, rice) 3. Avoid foods with preservatives 4. Avoid eating after 8 pm at night.  Do eat Eggs [place] salad with parsley, lemon juice, onions, celery Chicken salad with parsley, tarragon, onions, celery

## 2014-06-23 ENCOUNTER — Encounter: Payer: BLUE CROSS/BLUE SHIELD | Attending: Family Medicine | Admitting: Dietician

## 2014-06-23 ENCOUNTER — Encounter: Payer: Self-pay | Admitting: Dietician

## 2014-06-23 DIAGNOSIS — Z6841 Body Mass Index (BMI) 40.0 and over, adult: Secondary | ICD-10-CM | POA: Insufficient documentation

## 2014-06-23 DIAGNOSIS — Z713 Dietary counseling and surveillance: Secondary | ICD-10-CM | POA: Insufficient documentation

## 2014-06-23 NOTE — Patient Instructions (Signed)
-  Start eating a breakfast in the morning -Pack 2-3 snacks to take with you to work off of the the snack handout -Use Calorie Edison Pace to make better choices at restaurants -Eat a variety of fruits and vegetables -Try out some flavored waters

## 2014-06-23 NOTE — Progress Notes (Signed)
  Medical Nutrition Therapy:  Appt start time: 1100 end time:  1200.   Assessment:  Primary concerns today: Pateint states she has had weight issues over the past few years.  She is pre-diabetetic and has started to also have arthritis in her lower back.  She works and is in school.  States she does not have time to cook during the week so she mostly eats out.  She eats at the table at home when she brings home fast food and sometimes has the TV on.  She says she craves sweets and has been going to [employer] cupcakes.  She considers herself a fast eater.  She states it takes her about 20 minutes to finish a meal.  Preferred Learning Style:   No preference indicated   Learning Readiness:   Ready   MEDICATIONS: see list   DIETARY INTAKE:  Usual eating pattern includes 1 meals and 0 snacks per day.   24-hr recall:  B ( AM): none- but sometimes on weekends once or twice a month usually at a restaurants  Snk ( AM): none  L ( PM): none - occasionally on weekends Snk ( PM): none D (7-9 PM): fast food or from a restaurants ([employer], [employer])  Burrito bowl at [employer]otle with veggies, rice beans lettuce, gaucamole at [employer].  Pasta with chicken and a salad at [employer] ( PM): popcorn or chips Beverages: sodas (progessing from caffeine to sprite), hot tea, sweet tea  Usual physical activity: started taking the steps at work to the third floor instead of the elevator  Estimated energy needs: 1200 calories 135 g carbohydrates 90 g protein 33 g fat  Progress Towards Goal(s):  In progress.   Nutritional Diagnosis:  [place]-3.3 Overweight/obesity As related to poor eating habits and limited physical activity.  As evidenced by patient report and diet recall and BMI > 40..    Intervention:  Nutrition counseling provided.  Dicussed eating regularly through out the day and how this will effect her metabolism and blood sugar.  Explored options of foods she can eat for breakfast and take with her to  work and school.  Discussed ways to choose healthier options when eating out in restaurants.  Also discussed eating a variety of different fruits and vegetables to consume all her nutrients.  Also touched on healthier beverage options.  Goal: -Start eating a breakfast in the morning -Pack 2-3 snacks to take with you to work off of the the snack handout -Use Calorie Edison Pace to make better choices at restaurants -Eat a variety of fruits and vegetables -Try out some flavored waters  Teaching Method Utilized:  Visual Auditory  Handouts given during visit include:  15 g Carbohydrate Snack  My Plate  Low Sodium Flavorings  Barriers to learning/adherence to lifestyle change: busy schedule  Demonstrated degree of understanding via:  Teach Back   Monitoring/Evaluation:  Dietary intake, exercise, HbA1c, and body weight in 4 week(s).

## 2014-06-29 ENCOUNTER — Telehealth: Payer: Self-pay | Admitting: *Deleted

## 2014-06-29 DIAGNOSIS — R1032 Left lower quadrant pain: Secondary | ICD-10-CM

## 2014-06-29 MED ORDER — TRAMADOL HCL 50 MG PO TABS: 50.0000 mg | ORAL_TABLET | Freq: Four times a day (QID) | ORAL | Status: DC | PRN

## 2014-06-29 NOTE — Telephone Encounter (Signed)
Pt aware Rx called in, front desk will schedule ultrasound

## 2014-06-29 NOTE — Telephone Encounter (Signed)
If not pregnant call in presription for Ultram 50 mg one po q 6 hours PRN # [id number]. Ultrasound tomorrow ok

## 2014-06-29 NOTE — Telephone Encounter (Signed)
Pt called c/o left lower abdomen pain, pain scale 5-7 states pain is off and on notices more when moving around. No fever, no bleeding, LMP:05/12/14, just completed yesterday 10 day dose of provera as directed. Pain sharp off and on a times,x 1 week now. Pt said she can come in today or tomorrow. Per donna no ultrasound today, has ultrasound opening tomorrow at 4. Pt is not taking anything for the pain. Please advise

## 2014-06-30 ENCOUNTER — Ambulatory Visit (INDEPENDENT_AMBULATORY_CARE_PROVIDER_SITE_OTHER): Payer: BLUE CROSS/BLUE SHIELD | Admitting: Gynecology

## 2014-06-30 ENCOUNTER — Other Ambulatory Visit: Payer: Self-pay | Admitting: Gynecology

## 2014-06-30 ENCOUNTER — Ambulatory Visit (INDEPENDENT_AMBULATORY_CARE_PROVIDER_SITE_OTHER): Payer: BLUE CROSS/BLUE SHIELD

## 2014-06-30 ENCOUNTER — Encounter: Payer: Self-pay | Admitting: Gynecology

## 2014-06-30 DIAGNOSIS — N83202 Unspecified ovarian cyst, left side: Secondary | ICD-10-CM

## 2014-06-30 DIAGNOSIS — N83201 Unspecified ovarian cyst, right side: Secondary | ICD-10-CM

## 2014-06-30 DIAGNOSIS — N911 Secondary amenorrhea: Secondary | ICD-10-CM

## 2014-06-30 DIAGNOSIS — D251 Intramural leiomyoma of uterus: Secondary | ICD-10-CM | POA: Diagnosis not present

## 2014-06-30 DIAGNOSIS — N8320 Unspecified ovarian cysts: Secondary | ICD-10-CM | POA: Diagnosis not present

## 2014-06-30 DIAGNOSIS — R1032 Left lower quadrant pain: Secondary | ICD-10-CM

## 2014-06-30 DIAGNOSIS — N7011 Chronic salpingitis: Secondary | ICD-10-CM | POA: Diagnosis not present

## 2014-06-30 DIAGNOSIS — N838 Other noninflammatory disorders of ovary, fallopian tube and broad ligament: Secondary | ICD-10-CM

## 2014-06-30 DIAGNOSIS — N852 Hypertrophy of uterus: Secondary | ICD-10-CM

## 2014-06-30 NOTE — Progress Notes (Signed)
   Patient is a 44 year old who called the office on May 19 stated she was complaining of left lower abdominal pain on the scale of 5-7 stated that it comes and goes. She denied any fever chills nausea or vomiting. She reported her last menstrual period being 05/12/2014. She is not been sexually active over several years. She is overweight. She was called in Provera 10 mg to take 1 by mouth daily for 10 days and she recently completed hasn't started her cycle yet. She was instructed to come in for an ultrasound and exam. Patient denied any nausea, vomiting, nipple discharge, or unusual headaches or double vision. Review of patient's records indicated that back in 2014 she had an abdominal myomectomy and left ovarian cystectomy whereby the following pathology report had been reported:  Diagnosis  1. Cyst, biopsy  - CONSISTENT WITH BENIGN SEROUS CYST.  2. Ovary, left  - BRENNER TUMOR.  - MALIGNANT FEATURES ARE NOT IDENTIFIED.  3. Uterine fibroid(s)  - LEIOMYOMA.  - MALIGNANT FEATURES ARE NOT IDENTIFIED.   On exam patient was in no acute distress her abdomen was soft nontender no rebound or guarding since we were going to do an ultrasound with bypass the pelvic exam. Ultrasound today:  Patient with a pendulous abdomen. Enlarged fibroid uterus with a measurement of 11.1 x 7.9 x 7.3 cm endometrial stripe of 12 mm. Patient has an intramural myoma measuring 29 x 30 mm the second 1 24 x 27 mm slightly decreased in size from previous scan last year. She had a thin-walled right ovarian cyst measuring 23 x 20 mm with no color-flow. The left ovary there was a tubular structure highly suspicious for hydrosalpinx with a measurement of 6.5 x 2.2 x 6.1 cm unable to use a Doppler due to her pendulous abdomen so was suboptimal was a report by the ultrasound tech. There was some fluid in the cul-de-sac 53 x 37 mm The fundus area  Assessment/plan: Overweight patient with secondary amenorrhea has not been  sexually active in several years. We are going to check a TSH and prolactin and also a CA 125 for the structured mass noted although it appears to be a hydrosalpinx. Patient recently was started on Provera 10 mg for 10 days. After a week after her Provera she does not start her menses she'll return back to the office for further evaluation. If she does start her menses I want to repeat her ultrasound in 3 months unless is any abnormality of any of the above tests.

## 2014-06-30 NOTE — Patient Instructions (Signed)
Fibroid Uterine Fibroid A uterine fibroid is a growth (tumor) that occurs in your uterus. This type of tumor is not cancerous and does not spread out of the uterus. You can have one or many fibroids. Fibroids can vary in size, weight, and where they grow in the uterus. Some can become quite large. Most fibroids do not require medical treatment, but some can cause pain or heavy bleeding during and between periods. CAUSES  A fibroid is the result of a single uterine cell that keeps growing (unregulated), which is different than most cells in the human body. Most cells have a control mechanism that keeps them from reproducing without control.  SIGNS AND SYMPTOMS   Bleeding.  Pelvic pain and pressure.  Bladder problems due to the size of the fibroid.  Infertility and miscarriages depending on the size and location of the fibroid. DIAGNOSIS  Uterine fibroids are diagnosed through a physical exam. Your health care provider may feel the lumpy tumors during a pelvic exam. Ultrasonography may be done to get information regarding size, location, and number of tumors.  TREATMENT   Your health care provider may recommend watchful waiting. This involves getting the fibroid checked by your health care provider to see if it grows or shrinks.   Hormone treatment or an intrauterine device (IUD) may be prescribed.   Surgery may be needed to remove the fibroids (myomectomy) or the uterus (hysterectomy). This depends on your situation. When fibroids interfere with fertility and a woman wants to become pregnant, a health care provider may recommend having the fibroids removed.  [place] care depends on how you were treated. In general:   Keep all follow-up appointments with your health care provider.   Only take over-the-counter or prescription medicines as directed by your health care provider. If you were prescribed a hormone treatment, take the hormone medicines exactly as directed.  Do not take aspirin. It can cause bleeding.   Talk to your health care provider about taking iron pills.  If your periods are troublesome but not so heavy, lie down with your feet raised slightly above your heart. Place cold packs on your lower abdomen.   If your periods are heavy, write down the number of pads or tampons you use per month. Bring this information to your health care provider.   Include green vegetables in your diet.  SEEK IMMEDIATE MEDICAL CARE IF:  You have pelvic pain or cramps not controlled with medicines.   You have a sudden increase in pelvic pain.   You have an increase in bleeding between and during periods.   You have excessive periods and soak tampons or pads in a half hour or less.  You feel lightheaded or have fainting episodes. Document Released: 01/25/2000 Document Revised: 11/17/2012 Document Reviewed: 08/26/2012 [facility] Patient Information 2015 [place], [place]. This information is not intended to replace advice given to you by your health care provider. Make sure you discuss any questions you have with your health care provider. CA-125 Tumor Marker CA 125 is a tumor marker that is used to help monitor the course of ovarian or endometrial cancer. PREPARATION FOR TEST No preparation is necessary. NORMAL FINDINGS Adults: 0-35 units/mL (0-35 kilounits)/L Ranges for normal findings may vary among different laboratories and hospitals. You should always check with your doctor after having lab work or other tests done to discuss the meaning of your test results and whether your values are considered within normal limits. MEANING OF TEST  Your caregiver  will go over the test results with you and discuss the importance and meaning of your results, as well as treatment options and the need for additional tests if necessary. OBTAINING THE TEST RESULTS It is your responsibility to obtain your test results. Ask the lab or department performing the test when  and how you will get your results. Document Released: 02/19/2004 Document Revised: 04/21/2011 Document Reviewed: 01/05/2008 [facility] Patient Information 2015 [place], [place]. This information is not intended to replace advice given to you by your health care provider. Make sure you discuss any questions you have with your health care provider. Ovarian Cyst An ovarian cyst is a fluid-filled sac that forms on an ovary. The ovaries are small organs that produce eggs in women. Various types of cysts can form on the ovaries. Most are not cancerous. Many do not cause problems, and they often go away on their own. Some may cause symptoms and require treatment. Common types of ovarian cysts include:  Functional cysts--These cysts may occur every month during the menstrual cycle. This is normal. The cysts usually go away with the next menstrual cycle if the woman does not get pregnant. Usually, there are no symptoms with a functional cyst.  Endometrioma cysts--These cysts form from the tissue that lines the uterus. They are also called "chocolate cysts" because they become filled with blood that turns brown. This type of cyst can cause pain in the lower abdomen during intercourse and with your menstrual period.  Cystadenoma cysts--This type develops from the cells on the outside of the ovary. These cysts can get very big and cause lower abdomen pain and pain with intercourse. This type of cyst can twist on itself, cut off its blood supply, and cause severe pain. It can also easily rupture and cause a lot of pain.  Dermoid cysts--This type of cyst is sometimes found in both ovaries. These cysts may contain different kinds of body tissue, such as skin, teeth, hair, or cartilage. They usually do not cause symptoms unless they get very big.  Theca lutein cysts--These cysts occur when too much of a certain hormone (human chorionic gonadotropin) is produced and overstimulates the ovaries to produce an egg. This is  most common after procedures used to assist with the conception of a baby (in vitro fertilization). CAUSES   Fertility drugs can cause a condition in which multiple large cysts are formed on the ovaries. This is called ovarian hyperstimulation syndrome.  A condition called polycystic ovary syndrome can cause hormonal imbalances that can lead to nonfunctional ovarian cysts. SIGNS AND SYMPTOMS  Many ovarian cysts do not cause symptoms. If symptoms are present, they may include:  Pelvic pain or pressure.  Pain in the lower abdomen.  Pain during sexual intercourse.  Increasing girth (swelling) of the abdomen.  Abnormal menstrual periods.  Increasing pain with menstrual periods.  Stopping having menstrual periods without being pregnant. DIAGNOSIS  These cysts are commonly found during a routine or annual pelvic exam. Tests may be ordered to find out more about the cyst. These tests may include:  Ultrasound.  X-ray of the pelvis.  CT scan.  MRI.  Blood tests. TREATMENT  Many ovarian cysts go away on their own without treatment. Your health care provider may want to check your cyst regularly for 2-3 months to see if it changes. For women in menopause, it is particularly important to monitor a cyst closely because of the higher rate of ovarian cancer in menopausal women. When treatment is needed, it  may include any of the following:  A procedure to drain the cyst (aspiration). This may be done using a long needle and ultrasound. It can also be done through a laparoscopic procedure. This involves using a thin, lighted tube with a tiny camera on the end (laparoscope) inserted through a small incision.  Surgery to remove the whole cyst. This may be done using laparoscopic surgery or an open surgery involving a larger incision in the lower abdomen.  Hormone treatment or birth control pills. These methods are sometimes used to help dissolve a cyst. HOME CARE INSTRUCTIONS   Only take  over-the-counter or prescription medicines as directed by your health care provider.  Follow up with your health care provider as directed.  Get regular pelvic exams and Pap tests. SEEK MEDICAL CARE IF:   Your periods are late, irregular, or painful, or they stop.  Your pelvic pain or abdominal pain does not go away.  Your abdomen becomes larger or swollen.  You have pressure on your bladder or trouble emptying your bladder completely.  You have pain during sexual intercourse.  You have feelings of fullness, pressure, or discomfort in your stomach.  You lose weight for no apparent reason.  You feel generally ill.  You become constipated.  You lose your appetite.  You develop acne.  You have an increase in body and facial hair.  You are gaining weight, without changing your exercise and eating habits.  You think you are pregnant. SEEK IMMEDIATE MEDICAL CARE IF:   You have increasing abdominal pain.  You feel sick to your stomach (nauseous), and you throw up (vomit).  You develop a fever that comes on suddenly.  You have abdominal pain during a bowel movement.  Your menstrual periods become heavier than usual. MAKE SURE YOU:  Understand these instructions.  Will watch your condition.  Will get help right away if you are not doing well or get worse. Document Released: 01/27/2005 Document Revised: 02/01/2013 Document Reviewed: 10/04/2012 [facility] Patient Information 2015 [place], [place]. This information is not intended to replace advice given to you by your health care provider. Make sure you discuss any questions you have with your health care provider.

## 2014-07-01 LAB — PROLACTIN: Prolactin: 12.8 ng/mL

## 2014-07-01 LAB — TSH: TSH: 1.84 u[IU]/mL (ref 0.350–4.500)

## 2014-07-01 LAB — CA 125: CA 125: 25 U/mL (ref <35)

## 2014-07-06 ENCOUNTER — Ambulatory Visit (INDEPENDENT_AMBULATORY_CARE_PROVIDER_SITE_OTHER): Payer: BLUE CROSS/BLUE SHIELD | Admitting: Family Medicine

## 2014-07-06 ENCOUNTER — Ambulatory Visit (INDEPENDENT_AMBULATORY_CARE_PROVIDER_SITE_OTHER): Payer: BLUE CROSS/BLUE SHIELD

## 2014-07-06 VITALS — BP 116/88 | HR 91 | Temp 99.1°F | Resp 18 | Ht 62.0 in | Wt 256.2 lb

## 2014-07-06 DIAGNOSIS — R109 Unspecified abdominal pain: Secondary | ICD-10-CM

## 2014-07-06 DIAGNOSIS — M546 Pain in thoracic spine: Secondary | ICD-10-CM

## 2014-07-06 DIAGNOSIS — M5135 Other intervertebral disc degeneration, thoracolumbar region: Secondary | ICD-10-CM | POA: Diagnosis not present

## 2014-07-06 DIAGNOSIS — M62838 Other muscle spasm: Secondary | ICD-10-CM

## 2014-07-06 DIAGNOSIS — R1011 Right upper quadrant pain: Secondary | ICD-10-CM

## 2014-07-06 DIAGNOSIS — M5414 Radiculopathy, thoracic region: Secondary | ICD-10-CM | POA: Diagnosis not present

## 2014-07-06 DIAGNOSIS — IMO0002 Reserved for concepts with insufficient information to code with codable children: Secondary | ICD-10-CM

## 2014-07-06 MED ORDER — CARISOPRODOL 350 MG PO TABS: 350.0000 mg | ORAL_TABLET | Freq: Three times a day (TID) | ORAL | Status: DC

## 2014-07-06 NOTE — Progress Notes (Addendum)
Subjective:  This chart was scribed for Mindy Cheadle, MD by Mindy Collins, Medical Scribe. This patient was seen in Room 1 and the patient's care was started at 1:20 PM.   Patient ID: Mindy Collins, female    DOB: 03/17/1970, 44 y.o.   MRN: [id number]  Chief Complaint  Patient presents with  . Follow-up    mid-back spasms since monday,took medication that was prescribed but no relief     HPI HPI Comments: Mindy Collins is a 44 y.o. female who presents to [facility] for a follow-up for exacerbation of her chronic back pain. She was seen 5 weeks previously. She had overdosed on her meloxicam, unintentionally thinking it was her hydrocodone. She was having chest pressure, diaphoresis, and dizziness. Fortunately her kidney function was still normal. I saw patient for her back pain four months ago. 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 She saw orthopedics and PT few years ago. Pt has been using flexeril, meloxicam, hydrocodone, and Tylenol 3. She saw chiropractor recently. Four months ago, when I saw Patient, I repeated X ray of lumbar spine, showed mild DDD and mild arthritis. I put her on prednisone taper at that time and switched her to diclofenac, as well as referred patient to PT. But it appears that patient did not schedule.   Today, patient reports that she is experiencing crushing right sided, mid back muscle spasms, radiating to the right ribs area, sudden onset 4 days ago. She states that the pain is worsened when bending or standing up. She notes that she took muscle relaxer (cyclobenzaprine) and her normal pain medications to alleviate the pain due to pain not subsiding as normal. She reports having an episode of 3 muscle spasms in a three second period while in a supine position, even when still on Flexeril. She also notes that she has taken 2 dosages of Hydrocodone for  the pain, which gave her relief. Pain is only present during spasms periods, otherwise no pain.   She notes that she did PT for her lower back pain, and reports that it was working well for her. She is not going to her chiropractor.   Past Medical History  Diagnosis Date  . HPV (human papilloma virus) infection     WARTS/ TCA TX  . Dysmenorrhea   . Hypertension   . Obesity   . GERD (gastroesophageal reflux disease)   . Seasonal allergies   . Anemia   . Fatty liver   . Colitis   . Carpal tunnel syndrome, bilateral   . Obesity    Current Outpatient Prescriptions on File Prior to Visit  Medication Sig Dispense Refill  . cyclobenzaprine (FLEXERIL) 10 MG tablet Take 1 tablet (10 mg total) by mouth 3 (three) times daily as needed for muscle spasms. 30 tablet 1  . fluticasone (FLONASE) 50 MCG/ACT nasal spray Place 1 spray into both nostrils daily.    Marland Kitchen HYDROcodone-acetaminophen (NORCO/VICODIN) 5-325 MG per tablet Take 1 tablet by mouth every 6 (six) hours as needed for moderate pain.    . medroxyPROGESTERone (PROVERA) 10 MG tablet Take 10 mg by mouth daily.    . meloxicam (MOBIC) 15 MG tablet Take 15 mg by mouth daily.    . Multiple Vitamin (MULTIVITAMIN) tablet Take 1 tablet by mouth daily.    Marland Kitchen olmesartan-hydrochlorothiazide (BENICAR HCT) 20-12.5 MG per tablet Take  1 tablet by mouth daily. 30 tablet 6  . RABEprazole (ACIPHEX) 20 MG tablet Take 1 tablet (20 mg total) by mouth daily. (Patient not taking: Reported on 05/30/2014) 30 tablet 2  . ranitidine (ZANTAC) 150 MG tablet Take 1 tablet (150 mg total) by mouth 2 (two) times daily. (Patient not taking: Reported on 05/30/2014) 60 tablet 0  . traMADol (ULTRAM) 50 MG tablet Take 1 tablet (50 mg total) by mouth every 6 (six) hours as needed. (Patient not taking: Reported on 07/06/2014) 30 tablet 0   No current facility-administered medications on file prior to visit.   Allergies  Allergen Reactions  . Esomeprazole Magnesium Shortness Of  Breath    Nervousness; tolerates Aciphex.  . Adhesive [Tape] Itching and Rash    Itching and rash with adhesive tape  . Latex Itching and Dermatitis    Skin blistering      Review of Systems  Constitutional: Positive for activity change. Negative for fever, chills and unexpected weight change.  Gastrointestinal: Negative for nausea, vomiting, abdominal pain, diarrhea and constipation.  Genitourinary: Negative for urgency, frequency, decreased urine volume and difficulty urinating.  Musculoskeletal: Positive for myalgias, back pain, arthralgias and gait problem. Negative for joint swelling.  Skin: Negative for rash.  Neurological: Negative for weakness and numbness.  Hematological: Negative for adenopathy. Does not bruise/bleed easily.  Psychiatric/Behavioral: Positive for sleep disturbance.   BP 116/88 mmHg  Pulse 91  Temp(Src) 99.1 F (37.3 C) (Oral)  Resp 18  Ht 5\' 2"  (1.575 m)  Wt 256 lb 3.2 oz (116.212 kg)  BMI 46.85 kg/m2  SpO2 99%  LMP 05/12/2014     Objective:   Physical Exam  Constitutional: She is oriented to person, place, and time. She appears well-developed and well-nourished. No distress.  HENT:  Head: Normocephalic and atraumatic.  Mouth/Throat: Oropharynx is clear and moist. No oropharyngeal exudate.  Eyes: Pupils are equal, round, and reactive to light.  Neck: Neck supple. No thyroid mass and no thyromegaly present.  Cardiovascular: Normal rate, regular rhythm and normal heart sounds.  Exam reveals no gallop and no friction rub.   No murmur heard. Pulmonary/Chest: Effort normal and breath sounds normal. No respiratory distress. She has no wheezes.  Musculoskeletal: She exhibits no edema.  Point tenderness T 9 and T12, palpation of these areas does cause pain radiation to the right flank. Palpable rhomboid, and no muscle spasm, L is equal to R, and no point tenderness.  Neurological: She is alert and oriented to person, place, and time. No cranial nerve  deficit.  Skin: Skin is warm and dry. No rash noted.  Psychiatric: She has a normal mood and affect. Her behavior is normal.  Nursing note and vitals reviewed.    [facility] reading (PRIMARY) by  Dr. Brigitte Pulse. Thoracic xray: minimal spondylosis, no acute abnormality or sig deg disc disease  Assessment & Plan:   1. Acute right flank pain   2. Right-sided thoracic back pain   3. Muscle spasm   4. Degeneration of thoracic or thoracolumbar intervertebral disc   5. Thoracic radiculopathy   Sxs likely triggered from freq lifting/transferring of her mother and pt's large pendulous breasts. Suspect rhomboid spasm but can't r/o a thoracic right radiculopathy from DDD. Try soma qhs - pt reports can tolerate flexeril w/o sig fatigue so ok to use during day prn. Heat 20 min tid. Try prn tramadol that was rx'ed by gyn along w/ prn otc nsaids.  Refer to [facility]. Reviewed recs for weight loss  and exercise - try to work on posture, cons yoga/pilates.  Orders Placed This Encounter  Procedures  . DG Thoracic Spine 2 View    Standing Status: Future     Number of Occurrences: 1     Standing Expiration Date: 07/13/2014    Order Specific Question:  Reason for Exam (SYMPTOM  OR DIAGNOSIS REQUIRED)    Answer:  pain over t8-t11    Order Specific Question:  Is the patient pregnant?    Answer:  No    Order Specific Question:  Preferred imaging location?    Answer:  External  . Ambulatory referral to Physical Therapy    Referral Priority:  Routine    Referral Type:  Physical Medicine    Referral Reason:  Specialty Services Required    Requested Specialty:  Physical Therapy    Number of Visits Requested:  1    Meds ordered this encounter  Medications  . carisoprodol (SOMA) 350 MG tablet    Sig: Take 1 tablet (350 mg total) by mouth 3 (three) times daily.    Dispense:  40 tablet    Refill:  0    I personally performed the services described in this documentation, which was scribed in my presence. The  recorded information has been reviewed and considered, and addended by me as needed.  Mindy Cheadle, MD MPH

## 2014-07-06 NOTE — Patient Instructions (Signed)
Try the soma at night - if you tolerate this, keep taking it every night until your pain is completely gone.  It is ok to use some of the flexeril during the day or if these are causing to much fatigue I will call you in a more mild muscle relaxer. If your pain is persisting or the radiating around to your flank and abdomen is getting worse, we would do a course of prednisone - ok to call for this.  If you develop any urine symptoms, bowel changes, nausea, or rash, make sure you come back into clinic. Ok to add in tylenol or tramadol during the day as needed for pain - try the tramadol this weekend first and let me know if these aren't working for you.   Thoracic Strain You have injured the muscles or tendons that attach to the upper part of your back behind your chest. This injury is called a thoracic strain, thoracic sprain, or mid-back strain.  CAUSES  The cause of thoracic strain varies. A less severe injury involves pulling a muscle or tendon without tearing it. A more severe injury involves tearing (rupturing) a muscle or tendon. With less severe injuries, there may be little loss of strength. Sometimes, there are breaks (fractures) in the bones to which the muscles are attached. These fractures are rare, unless there was a direct hit (trauma) or you have weak bones due to osteoporosis or age. Longstanding strains may be caused by overuse or improper form during certain movements. Obesity can also increase your risk for back injuries. Sudden strains may occur due to injury or not warming up properly before exercise. Often, there is no obvious cause for a thoracic strain. SYMPTOMS  The main symptom is pain, especially with movement, such as during exercise. DIAGNOSIS  Your caregiver can usually tell what is wrong by taking an X-ray and doing a physical exam. TREATMENT   Physical therapy may be helpful for recovery. Your caregiver can give you exercises to do or refer you to a physical therapist  after your pain improves.  After your pain improves, strengthening and conditioning programs appropriate for your sport or occupation may be helpful.  Always warm up before physical activities or athletics. Stretching after physical activity may also help.  Certain over-the-counter medicines may also help. Ask your caregiver if there are medicines that would help you. If this is your first thoracic strain injury, proper care and proper healing time before starting activities should prevent long-term problems. Torn ligaments and tendons require as long to heal as broken bones. Average healing times may be only 1 week for a mild strain. For torn muscles and tendons, healing time may be up to 6 weeks to 2 months. HOME CARE INSTRUCTIONS   Apply ice to the injured area. Ice massages may also be used as directed.  Put ice in a plastic bag.  Place a towel between your skin and the bag.  Leave the ice on for 15-20 minutes, 03-04 times a day, for the first 2 days.  Only take over-the-counter or prescription medicines for pain, discomfort, or fever as directed by your caregiver.  Keep your appointments for physical therapy if this was prescribed.  Use wraps and back braces as instructed. SEEK IMMEDIATE MEDICAL CARE IF:   You have an increase in bruising, swelling, or pain.  Your pain has not improved with medicines.  You develop new shortness of breath, chest pain, or fever.  Problems seem to be getting worse rather  than better. MAKE SURE YOU:   Understand these instructions.  Will watch your condition.  Will get help right away if you are not doing well or get worse. Document Released: 04/19/2003 Document Revised: 04/21/2011 Document Reviewed: 03/15/2010 [facility] Patient Information 2015 [place], [place]. This information is not intended to replace advice given to you by your health care provider. Make sure you discuss any questions you have with your health care provider.

## 2014-07-20 ENCOUNTER — Telehealth: Payer: Self-pay | Admitting: *Deleted

## 2014-07-20 NOTE — Telephone Encounter (Signed)
Pt called c/o irregular bleeding, breakthough bleeding off and on x 1 month now. I called and left message on pt voicemail best to make OV with JF.

## 2014-07-26 ENCOUNTER — Other Ambulatory Visit: Payer: Self-pay

## 2014-07-26 DIAGNOSIS — Z1231 Encounter for screening mammogram for malignant neoplasm of breast: Secondary | ICD-10-CM

## 2014-07-27 ENCOUNTER — Ambulatory Visit (INDEPENDENT_AMBULATORY_CARE_PROVIDER_SITE_OTHER): Payer: BLUE CROSS/BLUE SHIELD | Admitting: Gynecology

## 2014-07-27 ENCOUNTER — Ambulatory Visit: Payer: BLUE CROSS/BLUE SHIELD | Admitting: Gynecology

## 2014-07-27 ENCOUNTER — Telehealth: Payer: Self-pay | Admitting: Gynecology

## 2014-07-27 ENCOUNTER — Encounter: Payer: Self-pay | Admitting: Gynecology

## 2014-07-27 ENCOUNTER — Ambulatory Visit: Payer: BLUE CROSS/BLUE SHIELD | Admitting: Dietician

## 2014-07-27 VITALS — BP 148/88

## 2014-07-27 DIAGNOSIS — N938 Other specified abnormal uterine and vaginal bleeding: Secondary | ICD-10-CM | POA: Diagnosis not present

## 2014-07-27 LAB — CBC WITH DIFFERENTIAL/PLATELET
Basophils Absolute: 0 10*3/uL (ref 0.0–0.1)
Basophils Relative: 0 % (ref 0–1)
Eosinophils Absolute: 0.2 10*3/uL (ref 0.0–0.7)
Eosinophils Relative: 2 % (ref 0–5)
HCT: 39.5 % (ref 36.0–46.0)
Hemoglobin: 12.8 g/dL (ref 12.0–15.0)
Lymphocytes Relative: 22 % (ref 12–46)
Lymphs Abs: 2.2 10*3/uL (ref 0.7–4.0)
MCH: 26.3 pg (ref 26.0–34.0)
MCHC: 32.4 g/dL (ref 30.0–36.0)
MCV: 81.3 fL (ref 78.0–100.0)
MPV: 9.4 fL (ref 8.6–12.4)
Monocytes Absolute: 0.9 10*3/uL (ref 0.1–1.0)
Monocytes Relative: 9 % (ref 3–12)
Neutro Abs: 6.8 10*3/uL (ref 1.7–7.7)
Neutrophils Relative %: 67 % (ref 43–77)
Platelets: 427 10*3/uL — ABNORMAL HIGH (ref 150–400)
RBC: 4.86 MIL/uL (ref 3.87–5.11)
RDW: 15 % (ref 11.5–15.5)
WBC: 10.2 10*3/uL (ref 4.0–10.5)

## 2014-07-27 NOTE — Telephone Encounter (Signed)
006/16/16-I LM VM for pt that her BC ins will cover the Mirena IUD and insertion for contraception at 100%, no copay.wl Her policy will term on 56/70/14

## 2014-07-27 NOTE — Progress Notes (Signed)
   Patient is a 44 year old who is overweight who presented to the office today with complaint of irregular menstrual cycles. She was last seen the office on May 20 her history as follows:   She is not sexually active. She is overweight. She has taken Provera 10 mg by mouth for 10 days of the month when she has not had a menstrual cycle. She had been complaining of lower abdominal pain but is not having any pains today. Review records demonstrate the following as well: Back in 2014 she had an abdominal myomectomy and left ovarian cystectomy whereby the following pathology report had been reported:  Diagnosis  1. Cyst, biopsy  - CONSISTENT WITH BENIGN SEROUS CYST.  2. Ovary, left  - BRENNER TUMOR.  - MALIGNANT FEATURES ARE NOT IDENTIFIED.  3. Uterine fibroid(s)  - LEIOMYOMA.  - MALIGNANT FEATURES ARE NOT IDENTIFIED.  Patient had an ultrasound that office visit on May 23 demonstrated the following: Patient with a pendulous abdomen. Enlarged fibroid uterus with a measurement of 11.1 x 7.9 x 7.3 cm endometrial stripe of 12 mm. Patient has an intramural myoma measuring 29 x 30 mm the second 1 24 x 27 mm slightly decreased in size from previous scan last year. She had a thin-walled right ovarian cyst measuring 23 x 20 mm with no color-flow. The left ovary there was a tubular structure highly suspicious for hydrosalpinx with a measurement of 6.5 x 2.2 x 6.1 cm unable to use a Doppler due to her pendulous abdomen so was suboptimal was a report by the ultrasound tech. There was some fluid in the cul-de-sac 53 x 37 mm  On that office visit patient had a normal CA 125 as well as TSH and prolactin.  She has stated that between April in June she has had irregular bleeding she would have a cycle for 6-7 days then stop and then spot for a few days as well. Patient had normal Pap smear 2013.  Pelvic exam: Pendulous abdomen soft nontender no rebound or guarding Pelvic: Bartholin urethra Skene was  within normal limits Vagina: No lesions or discharge Cervix: No lesions or discharge Uterus difficult to assess due to patient's abdominal girth Adnexa: No tenderness in the adnexa difficult to assess due to patient's obesity Rectal exam: Not done  Assessment/plan: Patient overweight history dysfunctional uterine bleeding 1 to return back on oral contraception the pill but since she has history of hypertension and is on medication I recommended that we proceed with a Mirena IUD next week. We'll do an endometrial biopsy next week before inserting the Mirena IUD and also do a Pap smear as well. Literature information was provided and we'll follow accordingly. We'll check her CBC today.

## 2014-07-28 ENCOUNTER — Telehealth: Payer: Self-pay | Admitting: *Deleted

## 2014-07-28 ENCOUNTER — Other Ambulatory Visit: Payer: Self-pay | Admitting: *Deleted

## 2014-07-28 DIAGNOSIS — R7989 Other specified abnormal findings of blood chemistry: Secondary | ICD-10-CM

## 2014-07-28 MED ORDER — DIAZEPAM 5 MG PO TABS
ORAL_TABLET | ORAL | Status: DC
Start: 2014-07-28 — End: 2014-09-27

## 2014-07-28 NOTE — Telephone Encounter (Signed)
Pt was seen on 07/27/14 discussed Mirena IUD, scheduled on 07/31/14 for placement. Pt said she is very nervous about this and asked if a progesterone birth contol pill only would be any option? Please advise

## 2014-07-28 NOTE — Telephone Encounter (Signed)
Pt aware Rx called in

## 2014-07-28 NOTE — Telephone Encounter (Signed)
Reassure her that this as was passed and she will do fine. If we will we can call her in Valium 5 mg to take the night before in the morning before the procedure. Have her taking ibuprofen as well. Tell her the procedure will take 30 seconds

## 2014-07-31 ENCOUNTER — Encounter: Payer: Self-pay | Admitting: Gynecology

## 2014-07-31 ENCOUNTER — Ambulatory Visit (INDEPENDENT_AMBULATORY_CARE_PROVIDER_SITE_OTHER): Payer: BLUE CROSS/BLUE SHIELD | Admitting: Gynecology

## 2014-07-31 VITALS — BP 136/88

## 2014-07-31 DIAGNOSIS — Z30430 Encounter for insertion of intrauterine contraceptive device: Secondary | ICD-10-CM

## 2014-07-31 DIAGNOSIS — N938 Other specified abnormal uterine and vaginal bleeding: Secondary | ICD-10-CM | POA: Diagnosis not present

## 2014-07-31 DIAGNOSIS — D75839 Thrombocytosis, unspecified: Secondary | ICD-10-CM | POA: Insufficient documentation

## 2014-07-31 DIAGNOSIS — Z975 Presence of (intrauterine) contraceptive device: Secondary | ICD-10-CM | POA: Insufficient documentation

## 2014-07-31 DIAGNOSIS — D473 Essential (hemorrhagic) thrombocythemia: Secondary | ICD-10-CM | POA: Diagnosis not present

## 2014-07-31 NOTE — Patient Instructions (Signed)

## 2014-07-31 NOTE — Progress Notes (Signed)
   Patient presented to the office today for placement of Mirena IUD for contraception as well as for cycle control. See previous note. Patient on last office visit CBC demonstrated that the only abnormal parameter was her platelet count that was at 427.000.                                                                    IUD procedure note       Patient presented to the office today for placement of Mirena IUD. The patient had previously been provided with literature information on this method of contraception. The risks benefits and pros and cons were discussed and all her questions were answered. She is fully aware that this form of contraception is 99% effective and is good for 5 years.  Pelvic exam: Bartholin urethra Skene glands: Within normal limits Vagina: No lesions or discharge Cervix: No lesions or discharge Uterus: Anteverted position, sounded to 9 cm Adnexa: No masses or tenderness Rectal exam: Not done  The cervix was cleansed with Betadine solution. A single-tooth tenaculum was placed on the anterior cervical lip. The uterus sounded to 9-1/2 centimeter. A sterile Pipelle was introduced into the uterine cavity to obtain tissue for pathological evaluation. The IUD was shown to the patient and inserted in a sterile fashion. The IUD string was trimmed. The single-tooth tenaculum was removed. Patient was instructed to return back to the office in one month for follow up.  Patient to have a recheck on her CBC in 3 months.    Mirena IUD was placed 07/31/2014 good for 5 years. Lot number [id number]

## 2014-08-09 ENCOUNTER — Ambulatory Visit: Payer: BLUE CROSS/BLUE SHIELD | Admitting: Gynecology

## 2014-08-29 ENCOUNTER — Encounter: Payer: Self-pay | Admitting: Gynecology

## 2014-08-29 ENCOUNTER — Ambulatory Visit (INDEPENDENT_AMBULATORY_CARE_PROVIDER_SITE_OTHER): Payer: BLUE CROSS/BLUE SHIELD | Admitting: Gynecology

## 2014-08-29 VITALS — BP 134/82

## 2014-08-29 DIAGNOSIS — Z30431 Encounter for routine checking of intrauterine contraceptive device: Secondary | ICD-10-CM | POA: Diagnosis not present

## 2014-08-29 DIAGNOSIS — N898 Other specified noninflammatory disorders of vagina: Secondary | ICD-10-CM

## 2014-08-29 DIAGNOSIS — N9489 Other specified conditions associated with female genital organs and menstrual cycle: Secondary | ICD-10-CM

## 2014-08-29 LAB — WET PREP FOR TRICH, YEAST, CLUE
Clue Cells Wet Prep HPF POC: NONE SEEN
Trich, Wet Prep: NONE SEEN
Yeast Wet Prep HPF POC: NONE SEEN

## 2014-08-29 NOTE — Progress Notes (Signed)
   Patient presented for her 1 month follow-up after having placed the Mirena IUD that'll was placed for contraception as well as for cycle control since her periods were very heavy. In June her CBC that was drawn as a result of her heavy bleeding had demonstrated her platelet count 4 and 27,000. Patient is doing well with a Mirena IUD she stated that shortly after her the IUD was placed she had a normal menstrual cycle and otherwise has no complaint all her slight vaginal odor. She is not sexually active currently.  Exam: Blood pressure 1 3482 Gen. appearance well-developed on nursing on no acute distress Abdomen: Soft nontender no rebound or guarding External genitalia no lesions or discharge Vagina: No lesions or discharge Cervix: No lesions or discharge IUD visualized Uterus: Anteverted normal size shape and consistency Adnexa: No palpable masses or tenderness Rectal exam not done  Wet prep negative  Assessment/plan: Patient one month status post placement of Mirena IUD doing well. Patient to return back in September for her annual exam in a fasting states we can do not only her lipid profile but also to follow-up on her platelet count.

## 2014-09-11 ENCOUNTER — Ambulatory Visit: Payer: BLUE CROSS/BLUE SHIELD

## 2014-09-25 ENCOUNTER — Ambulatory Visit (INDEPENDENT_AMBULATORY_CARE_PROVIDER_SITE_OTHER): Payer: BLUE CROSS/BLUE SHIELD | Admitting: Emergency Medicine

## 2014-09-25 VITALS — BP 145/88 | HR 102 | Temp 98.4°F | Resp 18 | Ht 62.0 in | Wt 259.2 lb

## 2014-09-25 DIAGNOSIS — I10 Essential (primary) hypertension: Secondary | ICD-10-CM | POA: Diagnosis not present

## 2014-09-25 MED ORDER — HYDROCHLOROTHIAZIDE 25 MG PO TABS: 25.0000 mg | ORAL_TABLET | Freq: Every day | ORAL | Status: DC

## 2014-09-25 MED ORDER — OLMESARTAN MEDOXOMIL 20 MG PO TABS: 20.0000 mg | ORAL_TABLET | Freq: Every day | ORAL | Status: DC

## 2014-09-25 NOTE — Progress Notes (Signed)
Subjective:  Patient ID: Mindy Collins, female    DOB: 17-May-1970  Age: 44 y.o. MRN: [id number]  CC: blood pressure check   HPI Mindy Collins presents   For refill on her antihypertensives. She is tolerating the medication well with no adverse effect of the drug. She is not out of her medicine.  History Mindy Collins has a past medical history of HPV (human papilloma virus) infection; Dysmenorrhea; Hypertension; Obesity; GERD (gastroesophageal reflux disease); Seasonal allergies; Anemia; Fatty liver; Colitis; Carpal tunnel syndrome, bilateral; and Obesity.   She has past surgical history that includes Gynecologic cryosurgery (1993); Myomectomy abdominal approach (04/31/2007); Myomectomy (02/17/2011); and Ovarian cyst removal (02/17/2011).   Her  family history includes Breast cancer in her maternal aunt and mother; Cancer in her father, mother, and paternal aunt; Diabetes in her maternal grandfather; Heart disease in her maternal grandmother; Hypertension in her mother; Ovarian cancer in her paternal aunt.  She   reports that she has never smoked. She has never used smokeless tobacco. She reports that she does not drink alcohol or use illicit drugs.  Outpatient Prescriptions Prior to Visit  Medication Sig Dispense Refill  . fluticasone (FLONASE) 50 MCG/ACT nasal spray Place 1 spray into both nostrils daily.    Marland Kitchen levonorgestrel (MIRENA) 20 MCG/24HR IUD 1 each by Intrauterine route once.    . Multiple Vitamin (MULTIVITAMIN) tablet Take 1 tablet by mouth daily.    Marland Kitchen olmesartan-hydrochlorothiazide (BENICAR HCT) 20-12.5 MG per tablet Take 1 tablet by mouth daily. 30 tablet 6  . carisoprodol (SOMA) 350 MG tablet Take 1 tablet (350 mg total) by mouth 3 (three) times daily. (Patient not taking: Reported on 09/25/2014) 40 tablet 0  . diazepam (VALIUM) 5 MG tablet Take one tablet the night before procedure and take one table in am before procedure (Patient not taking: Reported on 09/25/2014) 2 tablet 0  .  HYDROcodone-acetaminophen (NORCO/VICODIN) 5-325 MG per tablet Take 1 tablet by mouth every 6 (six) hours as needed for moderate pain.    . meloxicam (MOBIC) 15 MG tablet Take 15 mg by mouth daily.     No facility-administered medications prior to visit.    Social History   Social History  . Marital Status: Single    Spouse Name: N/A  . Number of Children: 0  . Years of Education: N/A   Occupational History  . 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 Smoking status: Never Smoker   . Smokeless tobacco: Never Used     Comment: denies tobacco   . Alcohol Use: No  . Drug Use: No  . Sexual Activity: No   Other Topics Concern  . None   Social History Narrative     Review of Systems  Objective:  BP 145/88 mmHg  Pulse 102  Temp(Src) 98.4 F (36.9 C) (Oral)  Resp 18  Ht 5\' 2"  (1.575 m)  Wt 259 lb 3.2 oz (117.572 kg)  BMI 47.40 kg/m2  SpO2 98%  LMP 09/17/2014  Physical Exam    Assessment & Plan:   Mindy Collins was seen today for blood pressure check.  Diagnoses and all orders for this visit:  Essential hypertension, benign  Morbid obesity  Essential hypertension  Other orders -     olmesartan (BENICAR) 20 MG tablet; Take 1 tablet (20 mg total) by mouth daily. -     hydrochlorothiazide (HYDRODIURIL) 25 MG tablet; Take 1 tablet (25 mg total) by mouth daily.   I  am having [name] start on olmesartan and hydrochlorothiazide. I am also having her maintain her multivitamin, olmesartan-hydrochlorothiazide, meloxicam, HYDROcodone-acetaminophen, fluticasone, carisoprodol, diazepam, and levonorgestrel.  Meds ordered this encounter  Medications  . olmesartan (BENICAR) 20 MG tablet    Sig: Take 1 tablet (20 mg total) by mouth daily.    Dispense:  30 tablet    Refill:  5  . hydrochlorothiazide (HYDRODIURIL) 25 MG tablet    Sig: Take 1 tablet (25 mg total) by mouth daily.    Dispense:  90 tablet    Refill:  3    Appropriate red  flag conditions were discussed with the patient as well as actions that should be taken.  Patient expressed his understanding.  Follow-up: Return in about 3 months (around 12/26/2014).  Roselee Culver, MD

## 2014-09-25 NOTE — Progress Notes (Signed)
Subjective:  Patient ID: Mindy Collins, female    DOB: 1970-09-30  Age: 44 y.o. MRN: [id number]  CC: blood pressure check   HPI Mindy Collins presents    History Mindy Collins has a past medical history of HPV (human papilloma virus) infection; Dysmenorrhea; Hypertension; Obesity; GERD (gastroesophageal reflux disease); Seasonal allergies; Anemia; Fatty liver; Colitis; Carpal tunnel syndrome, bilateral; and Obesity.   She has past surgical history that includes Gynecologic cryosurgery (1993); Myomectomy abdominal approach (04/31/2007); Myomectomy (02/17/2011); and Ovarian cyst removal (02/17/2011).   Mindy Collins  family history includes Breast cancer in Mindy Collins maternal aunt and mother; Cancer in Mindy Collins father, mother, and paternal aunt; Diabetes in Mindy Collins maternal grandfather; Heart disease in Mindy Collins maternal grandmother; Hypertension in Mindy Collins mother; Ovarian cancer in Mindy Collins paternal aunt.  She   reports that she has never smoked. She has never used smokeless tobacco. She reports that she does not drink alcohol or use illicit drugs.  Outpatient Prescriptions Prior to Visit  Medication Sig Dispense Refill  . fluticasone (FLONASE) 50 MCG/ACT nasal spray Place 1 spray into both nostrils daily.    Marland Kitchen levonorgestrel (MIRENA) 20 MCG/24HR IUD 1 each by Intrauterine route once.    . Multiple Vitamin (MULTIVITAMIN) tablet Take 1 tablet by mouth daily.    Marland Kitchen olmesartan-hydrochlorothiazide (BENICAR HCT) 20-12.5 MG per tablet Take 1 tablet by mouth daily. 30 tablet 6  . carisoprodol (SOMA) 350 MG tablet Take 1 tablet (350 mg total) by mouth 3 (three) times daily. (Patient not taking: Reported on 09/25/2014) 40 tablet 0  . diazepam (VALIUM) 5 MG tablet Take one tablet the night before procedure and take one table in am before procedure (Patient not taking: Reported on 09/25/2014) 2 tablet 0  . HYDROcodone-acetaminophen (NORCO/VICODIN) 5-325 MG per tablet Take 1 tablet by mouth every 6 (six) hours as needed for moderate pain.    . meloxicam  (MOBIC) 15 MG tablet Take 15 mg by mouth daily.     No facility-administered medications prior to visit.    Social History   Social History  . Marital Status: Single    Spouse Name: N/A  . Number of Children: 0  . Years of Education: N/A   Occupational History  . 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 Smoking status: Never Smoker   . Smokeless tobacco: Never Used     Comment: denies tobacco   . Alcohol Use: No  . Drug Use: No  . Sexual Activity: No   Other Topics Concern  . None   Social History Narrative     Review of Systems  Constitutional: Negative for fever, chills and appetite change.  HENT: Negative for congestion, ear pain, postnasal drip, sinus pressure and sore throat.   Eyes: Negative for pain and redness.  Respiratory: Negative for cough, shortness of breath and wheezing.   Cardiovascular: Negative for leg swelling.  Gastrointestinal: Negative for nausea, vomiting, abdominal pain, diarrhea, constipation and blood in stool.  Endocrine: Negative for polyuria.  Genitourinary: Negative for dysuria, urgency, frequency and flank pain.  Musculoskeletal: Negative for gait problem.  Skin: Negative for rash.  Neurological: Negative for weakness and headaches.  Psychiatric/Behavioral: Negative for confusion and decreased concentration. The patient is not nervous/anxious.     Objective:  BP 145/88 mmHg  Pulse 102  Temp(Src) 98.4 F (36.9 C) (Oral)  Resp 18  Ht 5\' 2"  (1.575 m)  Wt 259 lb 3.2 oz (117.572 kg)  BMI 47.40 kg/m2  SpO2 98%  LMP 09/17/2014  Physical Exam  Constitutional: She is oriented to person, place, and time. She appears well-developed and well-nourished.  HENT:  Head: Normocephalic and atraumatic.  Eyes: Conjunctivae are normal. Pupils are equal, round, and reactive to light.  Pulmonary/Chest: Effort normal.  Musculoskeletal: She exhibits no edema.  Neurological: She is alert and oriented to  person, place, and time.  Skin: Skin is dry.  Psychiatric: She has a normal mood and affect. Mindy Collins behavior is normal. Thought content normal.      Assessment & Plan:   Mindy Collins was seen today for blood pressure check.  Diagnoses and all orders for this visit:  Essential hypertension, benign  Morbid obesity  Essential hypertension  Other orders -     olmesartan (BENICAR) 20 MG tablet; Take 1 tablet (20 mg total) by mouth daily. -     hydrochlorothiazide (HYDRODIURIL) 25 MG tablet; Take 1 tablet (25 mg total) by mouth daily.   I am having Mindy Collins start on olmesartan and hydrochlorothiazide. I am also having Mindy Collins maintain Mindy Collins multivitamin, olmesartan-hydrochlorothiazide, meloxicam, HYDROcodone-acetaminophen, fluticasone, carisoprodol, diazepam, and levonorgestrel.  Meds ordered this encounter  Medications  . olmesartan (BENICAR) 20 MG tablet    Sig: Take 1 tablet (20 mg total) by mouth daily.    Dispense:  30 tablet    Refill:  5  . hydrochlorothiazide (HYDRODIURIL) 25 MG tablet    Sig: Take 1 tablet (25 mg total) by mouth daily.    Dispense:  90 tablet    Refill:  3    Appropriate red flag conditions were discussed with the patient as well as actions that should be taken.  Patient expressed his understanding.  Follow-up: Return in about 3 months (around 12/26/2014).  Roselee Culver, MD

## 2014-09-25 NOTE — Patient Instructions (Signed)

## 2014-09-27 ENCOUNTER — Ambulatory Visit (INDEPENDENT_AMBULATORY_CARE_PROVIDER_SITE_OTHER): Payer: BLUE CROSS/BLUE SHIELD | Admitting: Family Medicine

## 2014-09-27 VITALS — BP 122/78 | HR 104 | Temp 98.0°F | Resp 18 | Ht 63.5 in | Wt 260.2 lb

## 2014-09-27 DIAGNOSIS — I10 Essential (primary) hypertension: Secondary | ICD-10-CM

## 2014-09-27 DIAGNOSIS — M546 Pain in thoracic spine: Secondary | ICD-10-CM

## 2014-09-27 MED ORDER — MELOXICAM 15 MG PO TABS: 15.0000 mg | ORAL_TABLET | Freq: Every day | ORAL | Status: DC

## 2014-09-27 MED ORDER — OXYCODONE-ACETAMINOPHEN 5-325 MG PO TABS: 1.0000 | ORAL_TABLET | Freq: Three times a day (TID) | ORAL | Status: DC | PRN

## 2014-09-27 MED ORDER — MEDROXYPROGESTERONE ACETATE 150 MG/ML IM SUSP: 150.0000 mg | Freq: Once | INTRAMUSCULAR | Status: DC

## 2014-09-27 NOTE — Progress Notes (Addendum)
Chief Complaint:  Chief Complaint  Patient presents with  . Back Pain    HPI: Mindy Collins is a 44 y.o. female who reports to [facility] today complaining of mid back pain about 4 days ago after cleaning, she tried her muscle relaxer without releif.  NO numbness, weakness, tingling, no incontinence. NO vaginal or urinary sxs, no hx of kedney stones. Denies n.v.abd pain. She cannot hardly move due to back pain  FINDINGS: No fracture. No spondylolisthesis. Transitional lumbosacral vertebrae. Mild loss of disc height at L4-L5. Mild facet degenerative change noted in the lower lumbar spine. Soft tissues are unremarkable.  IMPRESSION: No fracture or acute finding.   Electronically Signed  By: Lajean Manes M.D.  On: 03/11/2014 17:02  FINDINGS: Mild anterior spur formation at multiple levels. No fractures or subluxations seen.  IMPRESSION: Mild degenerative changes.   Electronically Signed  By: Claudie Revering M.D.  On: 07/06/2014 15:35  BP Readings from Last 3 Encounters:  09/27/14 122/78  09/25/14 145/88  08/29/14 134/82     Past Medical History  Diagnosis Date  . HPV (human papilloma virus) infection     WARTS/ TCA [place]  . Dysmenorrhea   . Hypertension   . Obesity   . GERD (gastroesophageal reflux disease)   . Seasonal allergies   . Anemia   . Fatty liver   . Colitis   . Carpal tunnel syndrome, bilateral   . Obesity    Past Surgical History  Procedure Laterality Date  . Gynecologic cryosurgery  1993    DYSPLASIA CERVIX/   . Myomectomy abdominal approach  04/31/2007  . Myomectomy  02/17/2011    Procedure: MYOMECTOMY;  Surgeon: Terrance Mass, MD;  Location: [facility];  Service: Gynecology;  Laterality: N/A;  I am hoping to get a 7:30am time on Dec 4, Dec 11 or Dec 12. It not available I will check on some 1:00 pm times. Thanks  . Ovarian cyst removal  02/17/2011    Procedure: OVARIAN CYSTECTOMY;  Surgeon: Terrance Mass, MD;  Location: [facility];   Service: Gynecology;  Laterality: Left;   Social History   Social History  . Marital Status: Single    Spouse Name: N/A  . Number of Children: 0  . Years of Education: N/A   Occupational History  . 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 Smoking status: Never Smoker   . Smokeless tobacco: Never Used     Comment: denies tobacco   . Alcohol Use: No  . Drug Use: No  . Sexual Activity: No   Other Topics Concern  . None   Social History Narrative   Family History  Problem Relation Age of Onset  . Hypertension Mother   . Breast cancer Mother   . Cancer Mother   . Heart disease Maternal Grandmother   . Cancer Father     LUNG  . Breast cancer Maternal Aunt   . Diabetes Maternal Grandfather   . Cancer Paternal Aunt   . Ovarian cancer Paternal Aunt    Allergies  Allergen Reactions  . Esomeprazole Magnesium Shortness Of Breath    Nervousness; tolerates Aciphex.  . Adhesive [Tape] Itching and Rash    Itching and rash with adhesive tape  . Latex Itching and Dermatitis    Skin blistering   Prior to Admission medications   Medication Sig Start Date End Date Taking? Authorizing Provider  cyclobenzaprine (FLEXERIL) 10 MG tablet  Take 10 mg by mouth 3 (three) times daily.   Yes Historical Provider, MD  fluticasone (FLONASE) 50 MCG/ACT nasal spray Place 1 spray into both nostrils daily.   Yes Historical Provider, MD  HYDROcodone-acetaminophen (NORCO/VICODIN) 5-325 MG per tablet Take 1 tablet by mouth every 6 (six) hours as needed for moderate pain.   Yes Historical Provider, MD  levonorgestrel (MIRENA) 20 MCG/24HR IUD 1 each by Intrauterine route once.   Yes Historical Provider, MD  loratadine (CLARITIN) 10 MG tablet Take 10 mg by mouth daily.   Yes Historical Provider, MD  Multiple Vitamin (MULTIVITAMIN) tablet Take 1 tablet by mouth daily.   Yes Historical Provider, MD  carisoprodol (SOMA) 350 MG tablet Take 1 tablet (350 mg total) by mouth  3 (three) times daily. Patient not taking: Reported on 09/25/2014 07/06/14   Shawnee Knapp, MD  diazepam (VALIUM) 5 MG tablet Take one tablet the night before procedure and take one table in am before procedure Patient not taking: Reported on 09/25/2014 07/28/14   Terrance Mass, MD  hydrochlorothiazide (HYDRODIURIL) 25 MG tablet Take 1 tablet (25 mg total) by mouth daily. Patient not taking: Reported on 09/27/2014 09/25/14   Roselee Culver, MD  meloxicam (MOBIC) 15 MG tablet Take 15 mg by mouth daily.    Historical Provider, MD  olmesartan-hydrochlorothiazide (BENICAR HCT) 20-12.5 MG per tablet Take 1 tablet by mouth daily. Patient not taking: Reported on 09/27/2014 02/23/14   Darreld Mclean, MD     ROS: The patient denies fevers, chills, night sweats, unintentional weight loss, chest pain, palpitations, wheezing, dyspnea on exertion, nausea, vomiting, abdominal pain, dysuria, hematuria, melena, numbness, weakness, or tingling.   All other systems have been reviewed and were otherwise negative with the exception of those mentioned in the HPI and as above.    PHYSICAL EXAM: Filed Vitals:   09/27/14 1741  BP: 122/78  Pulse: 104  Temp: 98 F (36.7 C)  Resp: 18   Body mass index is 45.36 kg/(m^2).   General: Alert, no acute distress HEENT:  Normocephalic, atraumatic, oropharynx patent. EOMI, PERRLA Cardiovascular:  Regular rate and rhythm, no rubs murmurs or gallops.  No Carotid bruits, radial pulse intact. No pedal edema.  Respiratory: Clear to auscultation bilaterally.  No wheezes, rales, or rhonchi.  No cyanosis, no use of accessory musculature Abdominal: No organomegaly, abdomen is soft and non-tender, positive bowel sounds. No masses. Skin: No rashes. Neurologic: Facial musculature symmetric. Psychiatric: Patient acts appropriately throughout our interaction. Lymphatic: No cervical or submandibular lymphadenopathy Musculoskeletal: Gait intact. No edema, tenderness + paramsk  tenderness  Decrease ROM due to pain 5/5 strength, 2/2 DTRs No saddle anesthesia Straight leg negative Hip and knee exam--normal      LABS: Results for orders placed or performed in visit on 08/29/14  WET PREP FOR [place], YEAST, CLUE  Result Value Ref Range   Yeast Wet Prep HPF POC NONE SEEN NONE SEEN   Trich, Wet Prep NONE SEEN NONE SEEN   Clue Cells Wet Prep HPF POC NONE SEEN NONE SEEN   WBC, Wet Prep HPF POC FEW NONE SEEN     EKG/XRAY:   Primary read interpreted by Dr. Marin Comment at [facility].   ASSESSMENT/PLAN: Encounter Diagnoses  Name Primary?  . Midline thoracic back pain Yes  . Essential hypertension, benign    Prior thoracic and lumabr spine shows DJD Rx Percocet, mobic, and flexeril [place] controlled substance database pulled, no illegal acitvities FU prn   Gross sideeffects, risk and  benefits, and alternatives of medications d/w patient. Patient is aware that all medications have potential sideeffects and we are unable to predict every sideeffect or drug-drug interaction that may occur.  Berk Pilot DO  10/02/2014 10:25 AM

## 2014-10-02 ENCOUNTER — Encounter: Payer: BLUE CROSS/BLUE SHIELD | Admitting: Gynecology

## 2014-10-14 ENCOUNTER — Ambulatory Visit (INDEPENDENT_AMBULATORY_CARE_PROVIDER_SITE_OTHER): Payer: BLUE CROSS/BLUE SHIELD | Admitting: Family Medicine

## 2014-10-14 VITALS — BP 132/90 | HR 91 | Temp 99.2°F | Ht 63.5 in | Wt 258.6 lb

## 2014-10-14 DIAGNOSIS — I10 Essential (primary) hypertension: Secondary | ICD-10-CM | POA: Diagnosis not present

## 2014-10-14 MED ORDER — OLMESARTAN MEDOXOMIL-HCTZ 20-12.5 MG PO TABS: 1.0000 | ORAL_TABLET | Freq: Every day | ORAL | Status: DC

## 2014-10-14 NOTE — Patient Instructions (Signed)

## 2014-10-14 NOTE — Progress Notes (Signed)
Chief Complaint:  Chief Complaint  Patient presents with  . Medication Problem    Benicar causing nose bleeds x's 3 weeks    HPI: Mindy Collins is a 44 y.o. female who reports to [facility] today complaining of  Having nose bleeds, diarrhea and headaches with individual  benicar dosing. She was on Benicar HCT in the past without problems but was recently switched to separate Benicar and HCTZ due to poorly controlled HTN and an increase in HCTZ was added. She states she did not need to increase her HTN meds since that day it was elevated because she was rushing around with her mom who was recently dx with breast cancer. She is normally below 140/90 at home. She stopped taking the flonase since it could cause nose bleeds and that did not help. In the last 2 days she stopped using the benicar and her nose bleeds stopped. She has not had any URI sxs, she has been without 2 days and no sxs since. She wants to go back to the lower dose combinations.   BP Readings from Last 3 Encounters:  10/14/14 132/90  09/27/14 122/78  09/25/14 145/88     Past Medical History  Diagnosis Date  . HPV (human papilloma virus) infection     WARTS/ TCA [place]  . Dysmenorrhea   . Hypertension   . Obesity   . GERD (gastroesophageal reflux disease)   . Seasonal allergies   . Anemia   . Fatty liver   . Colitis   . Carpal tunnel syndrome, bilateral   . Obesity    Past Surgical History  Procedure Laterality Date  . Gynecologic cryosurgery  1993    DYSPLASIA CERVIX/   . Myomectomy abdominal approach  04/31/2007  . Myomectomy  02/17/2011    Procedure: MYOMECTOMY;  Surgeon: Terrance Mass, MD;  Location: [facility];  Service: Gynecology;  Laterality: N/A;  I am hoping to get a 7:30am time on Dec 4, Dec 11 or Dec 12. It not available I will check on some 1:00 pm times. Thanks  . Ovarian cyst removal  02/17/2011    Procedure: OVARIAN CYSTECTOMY;  Surgeon: Terrance Mass, MD;  Location: [facility];  Service: Gynecology;   Laterality: Left;   Social History   Social History  . Marital Status: Single    Spouse Name: N/A  . Number of Children: 0  . Years of Education: N/A   Occupational History  . 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 Smoking status: Never Smoker   . Smokeless tobacco: Never Used     Comment: denies tobacco   . Alcohol Use: No  . Drug Use: No  . Sexual Activity: No   Other Topics Concern  . None   Social History Narrative   Family History  Problem Relation Age of Onset  . Hypertension Mother   . Breast cancer Mother   . Cancer Mother   . Heart disease Maternal Grandmother   . Cancer Father     LUNG  . Breast cancer Maternal Aunt   . Diabetes Maternal Grandfather   . Cancer Paternal Aunt   . Ovarian cancer Paternal Aunt    Allergies  Allergen Reactions  . Esomeprazole Magnesium Shortness Of Breath    Nervousness; tolerates Aciphex.  . Adhesive [Tape] Itching and Rash    Itching and rash with adhesive tape  . Latex Itching and Dermatitis  Skin blistering   Prior to Admission medications   Medication Sig Start Date End Date Taking? Authorizing Provider  BENICAR 20 MG tablet Take 1 tablet by mouth daily. 09/26/14  Yes Historical Provider, MD  cyclobenzaprine (FLEXERIL) 10 MG tablet Take 10 mg by mouth 3 (three) times daily.   Yes Historical Provider, MD  hydrochlorothiazide (HYDRODIURIL) 25 MG tablet Take 1 tablet (25 mg total) by mouth daily. 09/25/14  Yes Roselee Culver, MD  HYDROcodone-acetaminophen (NORCO/VICODIN) 5-325 MG per tablet Take 1 tablet by mouth every 6 (six) hours as needed for moderate pain.   Yes Historical Provider, MD  levonorgestrel (MIRENA) 20 MCG/24HR IUD 1 each by Intrauterine route once.   Yes Historical Provider, MD  loratadine (CLARITIN) 10 MG tablet Take 10 mg by mouth daily.   Yes Historical Provider, MD  meloxicam (MOBIC) 15 MG tablet Take 1 tablet (15 mg total) by mouth daily. Take    With  food, no other NSAIDs 09/27/14  Yes Elin Seats P Dontrell Stuck, DO  Multiple Vitamin (MULTIVITAMIN) tablet Take 1 tablet by mouth daily.   Yes Historical Provider, MD  oxyCODONE-acetaminophen (PERCOCET) 5-325 MG per tablet Take 1 tablet by mouth every 8 (eight) hours as needed for severe pain. Do not take hydrocodone if you use this, take with stool softener 09/27/14  Yes Zamoria Boss P Kaithlyn Teagle, DO  olmesartan-hydrochlorothiazide (BENICAR HCT) 20-12.5 MG per tablet Take 1 tablet by mouth daily. This is  Dose change 10/14/14   Moksh Loomer P Ramin Zoll, DO     ROS: The patient denies fevers, chills, night sweats, unintentional weight loss, chest pain, palpitations, wheezing, dyspnea on exertion, nausea, vomiting, abdominal pain, dysuria, hematuria, melena, numbness, weakness, or tingling.   All other systems have been reviewed and were otherwise negative with the exception of those mentioned in the HPI and as above.    PHYSICAL EXAM: Filed Vitals:   10/14/14 1204  BP: 132/90  Pulse: 91  Temp: 99.2 F (37.3 C)   Body mass index is 45.08 kg/(m^2).   General: Alert, no acute distress HEENT:  Normocephalic, atraumatic, oropharynx patent. EOMI, PERRLA Cardiovascular:  Regular rate and rhythm, no rubs murmurs or gallops.  No Carotid bruits, radial pulse intact. No pedal edema.  Respiratory: Clear to auscultation bilaterally.  No wheezes, rales, or rhonchi.  No cyanosis, no use of accessory musculature Abdominal: No organomegaly, abdomen is soft and non-tender, positive bowel sounds. No masses. Skin: No rashes. Neurologic: Facial musculature symmetric. Psychiatric: Patient acts appropriately throughout our interaction. Lymphatic: No cervical or submandibular lymphadenopathy Musculoskeletal: Gait intact. No edema, tenderness   LABS: Results for orders placed or performed in visit on 08/29/14  WET PREP FOR [place], YEAST, CLUE  Result Value Ref Range   Yeast Wet Prep HPF POC NONE SEEN NONE SEEN   Trich, Wet Prep NONE SEEN NONE SEEN    Clue Cells Wet Prep HPF POC NONE SEEN NONE SEEN   WBC, Wet Prep HPF POC FEW NONE SEEN     EKG/XRAY:   Primary read interpreted by Dr. Marin Comment at [facility].   ASSESSMENT/PLAN: Encounter Diagnosis  Name Primary?  . Essential hypertension, benign Yes   Will represcribe her her old Benicar HCT combo dosing and she can stop the separate meds. She will check BP at  Home, goal is less than 140/90 Fu in 1 month  Gross sideeffects, risk and benefits, and alternatives of medications d/w patient. Patient is aware that all medications have potential sideeffects and we are unable to predict every sideeffect  or drug-drug interaction that may occur.  Donalee Gaumond DO  10/14/2014 1:04 PM

## 2014-10-31 ENCOUNTER — Encounter (HOSPITAL_COMMUNITY): Payer: Self-pay | Admitting: Emergency Medicine

## 2014-10-31 ENCOUNTER — Emergency Department (HOSPITAL_COMMUNITY): Payer: BLUE CROSS/BLUE SHIELD

## 2014-10-31 ENCOUNTER — Ambulatory Visit (INDEPENDENT_AMBULATORY_CARE_PROVIDER_SITE_OTHER): Payer: BLUE CROSS/BLUE SHIELD | Admitting: Family Medicine

## 2014-10-31 VITALS — BP 138/88 | HR 87 | Temp 98.4°F | Resp 18 | Ht 63.0 in | Wt 260.0 lb

## 2014-10-31 DIAGNOSIS — Z638 Other specified problems related to primary support group: Secondary | ICD-10-CM

## 2014-10-31 DIAGNOSIS — I10 Essential (primary) hypertension: Secondary | ICD-10-CM | POA: Diagnosis not present

## 2014-10-31 DIAGNOSIS — Z79899 Other long term (current) drug therapy: Secondary | ICD-10-CM | POA: Diagnosis not present

## 2014-10-31 DIAGNOSIS — R079 Chest pain, unspecified: Secondary | ICD-10-CM | POA: Insufficient documentation

## 2014-10-31 DIAGNOSIS — Z862 Personal history of diseases of the blood and blood-forming organs and certain disorders involving the immune mechanism: Secondary | ICD-10-CM | POA: Diagnosis not present

## 2014-10-31 DIAGNOSIS — M25522 Pain in left elbow: Secondary | ICD-10-CM

## 2014-10-31 DIAGNOSIS — M79622 Pain in left upper arm: Secondary | ICD-10-CM

## 2014-10-31 DIAGNOSIS — E669 Obesity, unspecified: Secondary | ICD-10-CM | POA: Insufficient documentation

## 2014-10-31 DIAGNOSIS — R61 Generalized hyperhidrosis: Secondary | ICD-10-CM

## 2014-10-31 DIAGNOSIS — Z791 Long term (current) use of non-steroidal anti-inflammatories (NSAID): Secondary | ICD-10-CM | POA: Insufficient documentation

## 2014-10-31 DIAGNOSIS — K219 Gastro-esophageal reflux disease without esophagitis: Secondary | ICD-10-CM | POA: Diagnosis not present

## 2014-10-31 DIAGNOSIS — F439 Reaction to severe stress, unspecified: Secondary | ICD-10-CM

## 2014-10-31 LAB — I-STAT TROPONIN, ED: Troponin i, poc: 0 ng/mL (ref 0.00–0.08)

## 2014-10-31 LAB — CBC
HCT: 41.6 % (ref 36.0–46.0)
Hemoglobin: 13.1 g/dL (ref 12.0–15.0)
MCH: 26.3 pg (ref 26.0–34.0)
MCHC: 31.5 g/dL (ref 30.0–36.0)
MCV: 83.4 fL (ref 78.0–100.0)
Platelets: 410 10*3/uL — ABNORMAL HIGH (ref 150–400)
RBC: 4.99 MIL/uL (ref 3.87–5.11)
RDW: 15.1 % (ref 11.5–15.5)
WBC: 14 10*3/uL — ABNORMAL HIGH (ref 4.0–10.5)

## 2014-10-31 LAB — BASIC METABOLIC PANEL
Anion gap: 6 (ref 5–15)
BUN: 12 mg/dL (ref 6–20)
CO2: 33 mmol/L — ABNORMAL HIGH (ref 22–32)
Calcium: 9.2 mg/dL (ref 8.9–10.3)
Chloride: 102 mmol/L (ref 101–111)
Creatinine, Ser: 0.8 mg/dL (ref 0.44–1.00)
GFR calc Af Amer: >60 mL/min (ref >60)
GFR calc non Af Amer: >60 mL/min (ref >60)
Glucose, Bld: 110 mg/dL — ABNORMAL HIGH (ref 65–99)
Potassium: 3.8 mmol/L (ref 3.5–5.1)
Sodium: 141 mmol/L (ref 135–145)

## 2014-10-31 NOTE — Progress Notes (Signed)
Chief Complaint:  Chief Complaint  Patient presents with  . Chest Pain    x last night  . Hypertension    x last night  . Arm Pain    left    HPI: Mindy Collins is a 44 y.o. female who reports to [facility] today complaining of  Left sided CP this morming lasting several  Minutes, she has HTN, obesity, not sure if she has hyperlipdeimeia, she is prediabetic Had some diaphoresis with this afterwards, there has been a lot of stress at home. No numbnness or tingling or jaw pain currently, she did have it earlier in the week some perioral right sided numbness and tingling She did have a diffuse headache behind her eyes earlier. She has HTN and is complaint with meds,  Last week, diastolic was 92-426, thsi was a digital cuff.  She kept monitoring that, she has had left sided CP and was tryign to relax, when she moving around, the same spot She took her BP at her moms BP it was 140/98 with a manual cuff She took her BP when she got home and it was 146/103, 141/103, HR was 118 and she was not exerting nay energy  She has been on prednisone taper for sinus infection, she also had a neb treatment Denies SOB, n/v/abd pain, vision changes.  Hx of GERD, carpal tunnel.   Her mom is getting chemo for a new dx of breast cancer. She is stressed about that, has not been getting a lot of sleep in the last 4 days.  She is in school. She had a stress echo in 2012 at [place] which was normal.    BP Readings from Last 3 Encounters:  10/31/14 138/88  10/14/14 132/90  09/27/14 122/78     Past Medical History  Diagnosis Date  . HPV (human papilloma virus) infection     WARTS/ TCA [place]  . Dysmenorrhea   . Hypertension   . Obesity   . GERD (gastroesophageal reflux disease)   . Seasonal allergies   . Anemia   . Fatty liver   . Colitis   . Carpal tunnel syndrome, bilateral   . Obesity    Past Surgical History  Procedure Laterality Date  . Gynecologic cryosurgery  1993    DYSPLASIA CERVIX/     . Myomectomy abdominal approach  04/31/2007  . Myomectomy  02/17/2011    Procedure: MYOMECTOMY;  Surgeon: Terrance Mass, MD;  Location: [facility];  Service: Gynecology;  Laterality: N/A;  I am hoping to get a 7:30am time on Dec 4, Dec 11 or Dec 12. It not available I will check on some 1:00 pm times. Thanks  . Ovarian cyst removal  02/17/2011    Procedure: OVARIAN CYSTECTOMY;  Surgeon: Terrance Mass, MD;  Location: [facility];  Service: Gynecology;  Laterality: Left;   Social History   Social History  . Marital Status: Single    Spouse Name: N/A  . Number of Children: 0  . Years of Education: N/A   Occupational History  . 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 Smoking status: Never Smoker   . Smokeless tobacco: Never Used     Comment: denies tobacco   . Alcohol Use: No  . Drug Use: No  . Sexual Activity: No   Other Topics Concern  . None   Social History Narrative   Family History  Problem Relation Age of  Onset  . Hypertension Mother   . Breast cancer Mother   . Cancer Mother   . Heart disease Maternal Grandmother   . Cancer Father     LUNG  . Breast cancer Maternal Aunt   . Diabetes Maternal Grandfather   . Cancer Paternal Aunt   . Ovarian cancer Paternal Aunt    Allergies  Allergen Reactions  . Esomeprazole Magnesium Shortness Of Breath    Nervousness; tolerates Aciphex.  . Adhesive [Tape] Itching and Rash    Itching and rash with adhesive tape  . Latex Itching and Dermatitis    Skin blistering   Prior to Admission medications   Medication Sig Start Date End Date Taking? Authorizing Provider  BENICAR 20 MG tablet Take 1 tablet by mouth daily. 09/26/14  Yes Historical Provider, MD  levonorgestrel (MIRENA) 20 MCG/24HR IUD 1 each by Intrauterine route once.   Yes Historical Provider, MD  loratadine (CLARITIN) 10 MG tablet Take 10 mg by mouth daily.   Yes Historical Provider, MD  meloxicam (MOBIC) 15 MG tablet Take 1 tablet  (15 mg total) by mouth daily. Take    With food, no other NSAIDs 09/27/14  Yes Vineta Carone P Ledarius Leeson, DO  Multiple Vitamin (MULTIVITAMIN) tablet Take 1 tablet by mouth daily.   Yes Historical Provider, MD  olmesartan-hydrochlorothiazide (BENICAR HCT) 20-12.5 MG per tablet Take 1 tablet by mouth daily. This is  Dose change 10/14/14  Yes Jahmire Ruffins P Gerre Ranum, DO  cyclobenzaprine (FLEXERIL) 10 MG tablet Take 10 mg by mouth 3 (three) times daily.    Historical Provider, MD  hydrochlorothiazide (HYDRODIURIL) 25 MG tablet Take 1 tablet (25 mg total) by mouth daily. Patient not taking: Reported on 10/31/2014 09/25/14   Roselee Culver, MD  HYDROcodone-acetaminophen (NORCO/VICODIN) 5-325 MG per tablet Take 1 tablet by mouth every 6 (six) hours as needed for moderate pain.    Historical Provider, MD  oxyCODONE-acetaminophen (PERCOCET) 5-325 MG per tablet Take 1 tablet by mouth every 8 (eight) hours as needed for severe pain. Do not take hydrocodone if you use this, take with stool softener Patient not taking: Reported on 10/31/2014 09/27/14   Tawonda Legaspi P Zoanne Newill, DO     ROS: The patient denies fevers, chills, night sweats, unintentional weight loss, palpitations, wheezing, dyspnea on exertion, nausea, vomiting, abdominal pain, dysuria, hematuria, melena All other systems have been reviewed and were otherwise negative with the exception of those mentioned in the HPI and as above.    PHYSICAL EXAM: Filed Vitals:   10/31/14 2001  BP: 138/88  Pulse: 87  Temp: 98.4 F (36.9 C)  Resp: 18   Body mass index is 46.07 kg/(m^2).   General: Alert, no acute distress HEENT:  Normocephalic, atraumatic, oropharynx patent. EOMI, PERRLA Cardiovascular:  Regular rate and rhythm, no rubs murmurs or gallops.  No Carotid bruits, radial pulse intact. No pedal edema.  Respiratory: Clear to auscultation bilaterally.  No wheezes, rales, or rhonchi.  No cyanosis, no use of accessory musculature Abdominal: No organomegaly, abdomen is soft and non-tender,  positive bowel sounds. No masses. Skin: No rashes. Neurologic: Facial musculature symmetric. CN 2-12 grossly intact, no slurred speech, 5/5 strength, 2/2 DTRs, cerebellar fxn intact Psychiatric: Patient acts appropriately throughout our interaction. Lymphatic: No cervical or submandibular lymphadenopathy Musculoskeletal: Gait intact. No edema, tenderness   LABS: Results for orders placed or performed in visit on 08/29/14  WET PREP FOR [place], YEAST, CLUE  Result Value Ref Range   Yeast Wet Prep HPF POC NONE SEEN NONE  SEEN   Trich, Wet Prep NONE SEEN NONE SEEN   Clue Cells Wet Prep HPF POC NONE SEEN NONE SEEN   WBC, Wet Prep HPF POC FEW NONE SEEN     EKG/XRAY:   Primary read interpreted by Dr. Marin Comment at [facility].   ASSESSMENT/PLAN: Encounter Diagnoses  Name Primary?  . Chest pain, unspecified chest pain type Yes  . Pain in joint, upper arm, left   . Diaphoresis   . Stress at home   . Essential hypertension    EKG looks unchanged from 2015, VSS Send to ER for chest pain ruleout, she has risk factors but recenlty has been under a lot of stress and not sleeping and was on prednisone She is anxious, will send to ER for further eval with cardiac enzymes prn , stable to go by private car.  Fu prn    Gross sideeffects, risk and benefits, and alternatives of medications d/w patient. Patient is aware that all medications have potential sideeffects and we are unable to predict every sideeffect or drug-drug interaction that may occur.  Elecia Serafin DO  10/31/2014 9:20 PM

## 2014-10-31 NOTE — ED Notes (Signed)
Pt. reports intermittent left chest pain with mild SOB and diaphoresis , denies nausea / no cough or congestion . No chest pain at arrival .

## 2014-11-01 ENCOUNTER — Emergency Department (HOSPITAL_COMMUNITY)
Admission: EM | Admit: 2014-11-01 | Discharge: 2014-11-01 | Disposition: A | Discharge status: Home / Self Care | Payer: BLUE CROSS/BLUE SHIELD | Attending: Emergency Medicine | Admitting: Emergency Medicine

## 2014-11-01 DIAGNOSIS — R079 Chest pain, unspecified: Secondary | ICD-10-CM

## 2014-11-01 LAB — I-STAT TROPONIN, ED: Troponin i, poc: 0 ng/mL (ref 0.00–0.08)

## 2014-11-01 NOTE — ED Provider Notes (Signed)
This chart was scribed for [place], DO by Forrestine Him, ED Scribe. This patient was seen in room A10C/A10C and the patient's care was started 3:51 AM.    TIME SEEN: 3:51 AM   CHIEF COMPLAINT:  Chief Complaint  Patient presents with  . Chest Pain     HPI:  HPI Comments: Mindy Collins is a 44 y.o. female with a PMHx of HTN who presents to the Emergency Department complaining of intermittent, ongoing L sided chest pain with associated mild shortness of breath and diaphoresis x 2 days. Pain is described as sharp. No aggravating or alleviating factors at this time. Currently she is pain free with last episode 3 hours ago lasting only a few seconds at a time. Last episode was yesterday. No chest pain today. States she has had diaphoretic episodes but they do not occur with chest pain. No OTC medications or home remedies attempted at home for chest pain or associated symptoms. No recent fever, chills, nausea, vomiting, and abdominal pain. Echocardiogram and cardiac stress test performed in 2012. She admits to a family history of heart disease; grandmother died at the age of 3 from heart related issues. Denies any recent long distance travel. No fracture, surgery, hospitalization recently. No history of PE or DVT. No estrogen use at this time. Mindy Collins is not a smoker. Currently chest pain-free. Pain is not exertional.  ROS: See HPI Constitutional: no fever.  Eyes: no drainage  ENT: no runny nose   Cardiovascular:  Positive chest pain  Resp: Positive SOB  GI: no vomiting GU: no dysuria Integumentary: no rash  Allergy: no hives  Musculoskeletal: no leg swelling  Neurological: no slurred speech ROS otherwise negative  PAST MEDICAL HISTORY/PAST SURGICAL HISTORY:  Past Medical History  Diagnosis Date  . HPV (human papilloma virus) infection     WARTS/ TCA [place]  . Dysmenorrhea   . Hypertension   . Obesity   . GERD (gastroesophageal reflux disease)   . Seasonal allergies   . Anemia    . Fatty liver   . Colitis   . Carpal tunnel syndrome, bilateral   . Obesity     MEDICATIONS:  Prior to Admission medications   Medication Sig Start Date End Date Taking? Authorizing Provider  cyclobenzaprine (FLEXERIL) 10 MG tablet Take 10 mg by mouth 3 (three) times daily.   Yes Historical Provider, MD  HYDROcodone-acetaminophen (NORCO/VICODIN) 5-325 MG per tablet Take 1 tablet by mouth every 6 (six) hours as needed for moderate pain.   Yes Historical Provider, MD  levonorgestrel (MIRENA) 20 MCG/24HR IUD 1 each by Intrauterine route once.   Yes Historical Provider, MD  loratadine (CLARITIN) 10 MG tablet Take 10 mg by mouth daily.   Yes Historical Provider, MD  meloxicam (MOBIC) 15 MG tablet Take 1 tablet (15 mg total) by mouth daily. Take    With food, no other NSAIDs 09/27/14  Yes Thao P Le, DO  Multiple Vitamin (MULTIVITAMIN) tablet Take 1 tablet by mouth daily.   Yes Historical Provider, MD  olmesartan-hydrochlorothiazide (BENICAR HCT) 20-12.5 MG per tablet Take 1 tablet by mouth daily. This is  Dose change 10/14/14  Yes Thao P Le, DO    ALLERGIES:  Allergies  Allergen Reactions  . Esomeprazole Magnesium Shortness Of Breath    Nervousness; tolerates Aciphex.  . Adhesive [Tape] Itching and Rash    Itching and rash with adhesive tape  . Latex Itching and Dermatitis    Skin blistering  . Nexium [Esomeprazole]  SOCIAL HISTORY:  Social History  Substance Use Topics  . Smoking status: Never Smoker   . Smokeless tobacco: Never Used     Comment: denies tobacco   . Alcohol Use: No    FAMILY HISTORY: Family History  Problem Relation Age of Onset  . Hypertension Mother   . Breast cancer Mother   . Cancer Mother   . Heart disease Maternal Grandmother   . Cancer Father     LUNG  . Breast cancer Maternal Aunt   . Diabetes Maternal Grandfather   . Cancer Paternal Aunt   . Ovarian cancer Paternal Aunt     EXAM: BP 146/80 mmHg  Pulse 97  Temp(Src) 98.9 F (37.2 C)  (Oral)  Resp 19  Ht 5\' 2"  (1.575 m)  Wt 260 lb (117.935 kg)  BMI 47.54 kg/m2  SpO2 98%  LMP 10/18/2014 CONSTITUTIONAL: Alert and oriented and responds appropriately to questions. Well-appearing; well-nourished HEAD: Normocephalic EYES: Conjunctivae clear, PERRL ENT: normal nose; no rhinorrhea; moist mucous membranes; pharynx without lesions noted NECK: Supple, no meningismus, no LAD  CARD: RRR; S1 and S2 appreciated; no murmurs, no clicks, no rubs, no gallops RESP: Normal chest excursion without splinting or tachypnea; breath sounds clear and equal bilaterally; no wheezes, no rhonchi, no rales, no hypoxia or respiratory distress, speaking full sentences ABD/GI: Normal bowel sounds; non-distended; soft, non-tender, no rebound, no guarding, no peritoneal signs BACK:  The back appears normal and is non-tender to palpation, there is no CVA tenderness EXT: Normal ROM in all joints; non-tender to palpation; no edema; normal capillary refill; no cyanosis, no calf tenderness or swelling    SKIN: Normal color for age and race; warm NEURO: Moves all extremities equally, sensation to light touch intact diffusely, cranial nerves II through XII intact PSYCH: The patient's mood and manner are appropriate. Grooming and personal hygiene are appropriate.  MEDICAL DECISION MAKING: Patient here with atypical chest pain. She is chest pain-free. EKG shows no ischemic changes. Troponin 2 negative. Chest x-ray clear. No risk factors for pulmonary embolus. Initially slightly tachycardic but this has resolved without intervention prior to me seeing the patient. She is hemodynamically stable. Chest x-ray clear. I do not feel she needs further emergent workup. I have recommended close outpatient follow-up with her primary care provider. Discussed return precautions. She verbalized understanding and is comfortable with plan.        EKG Interpretation  Date/Time:  Tuesday October 31 2014 21:45:00  EDT Ventricular Rate:  108 PR Interval:  130 QRS Duration: 84 QT Interval:  354 QTC Calculation: 474 R Axis:   12 Text Interpretation:  Sinus tachycardia Cannot rule out Anterior infarct , age undetermined Abnormal ECG No significant change since last tracing Confirmed by Addisyn Leclaire,  DO, [facility] [place] [phone]) on 11/01/2014 2:49:27 AM      I personally performed the services described in this documentation, which was scribed in my presence. The recorded information has been reviewed and is accurate.    Bland, DO 11/01/14 [phone]

## 2014-11-01 NOTE — Discharge Instructions (Signed)

## 2014-11-01 NOTE — ED Notes (Signed)
Pt left with all belongings and ambulated out of treatment area.  

## 2014-11-01 NOTE — ED Notes (Signed)
Family at bedside. 

## 2014-11-10 ENCOUNTER — Ambulatory Visit (INDEPENDENT_AMBULATORY_CARE_PROVIDER_SITE_OTHER): Payer: BLUE CROSS/BLUE SHIELD | Admitting: Gynecology

## 2014-11-10 ENCOUNTER — Encounter: Payer: Self-pay | Admitting: Gynecology

## 2014-11-10 ENCOUNTER — Other Ambulatory Visit (HOSPITAL_COMMUNITY)
Admission: RE | Admit: 2014-11-10 | Discharge: 2014-11-10 | Disposition: A | Discharge status: Home / Self Care | Payer: BLUE CROSS/BLUE SHIELD | Source: Ambulatory Visit | Attending: Gynecology | Admitting: Gynecology

## 2014-11-10 VITALS — BP 118/74 | Ht 62.5 in | Wt 259.0 lb

## 2014-11-10 DIAGNOSIS — D251 Intramural leiomyoma of uterus: Secondary | ICD-10-CM | POA: Diagnosis not present

## 2014-11-10 DIAGNOSIS — Z01419 Encounter for gynecological examination (general) (routine) without abnormal findings: Secondary | ICD-10-CM | POA: Diagnosis not present

## 2014-11-10 DIAGNOSIS — Z113 Encounter for screening for infections with a predominantly sexual mode of transmission: Secondary | ICD-10-CM | POA: Diagnosis not present

## 2014-11-10 DIAGNOSIS — Z1151 Encounter for screening for human papillomavirus (HPV): Secondary | ICD-10-CM | POA: Diagnosis not present

## 2014-11-10 DIAGNOSIS — I10 Essential (primary) hypertension: Secondary | ICD-10-CM | POA: Diagnosis not present

## 2014-11-10 DIAGNOSIS — E669 Obesity, unspecified: Secondary | ICD-10-CM

## 2014-11-10 DIAGNOSIS — Z23 Encounter for immunization: Secondary | ICD-10-CM

## 2014-11-10 DIAGNOSIS — N7011 Chronic salpingitis: Secondary | ICD-10-CM | POA: Diagnosis not present

## 2014-11-10 LAB — LIPID PANEL
Cholesterol: 237 mg/dL — ABNORMAL HIGH (ref 125–200)
HDL: 50 mg/dL (ref >=46)
LDL Cholesterol: 163 mg/dL — ABNORMAL HIGH (ref <130)
Total CHOL/HDL Ratio: 4.7 Ratio (ref <=5.0)
Triglycerides: 121 mg/dL (ref <150)
VLDL: 24 mg/dL (ref <30)

## 2014-11-10 LAB — COMPREHENSIVE METABOLIC PANEL
ALT: 19 U/L (ref 6–29)
AST: 18 U/L (ref 10–30)
Albumin: 4.2 g/dL (ref 3.6–5.1)
Alkaline Phosphatase: 103 U/L (ref 33–115)
BUN: 13 mg/dL (ref 7–25)
CO2: 30 mmol/L (ref 20–31)
Calcium: 9.4 mg/dL (ref 8.6–10.2)
Chloride: 105 mmol/L (ref 98–110)
Creat: 0.71 mg/dL (ref 0.50–1.10)
Glucose, Bld: 102 mg/dL — ABNORMAL HIGH (ref 65–99)
Potassium: 3.9 mmol/L (ref 3.5–5.3)
Sodium: 143 mmol/L (ref 135–146)
Total Bilirubin: 0.6 mg/dL (ref 0.2–1.2)
Total Protein: 7.2 g/dL (ref 6.1–8.1)

## 2014-11-10 LAB — CBC WITH DIFFERENTIAL/PLATELET
Basophils Absolute: 0 10*3/uL (ref 0.0–0.1)
Basophils Relative: 0 % (ref 0–1)
Eosinophils Absolute: 0.1 10*3/uL (ref 0.0–0.7)
Eosinophils Relative: 1 % (ref 0–5)
HCT: 40 % (ref 36.0–46.0)
Hemoglobin: 13.1 g/dL (ref 12.0–15.0)
Lymphocytes Relative: 21 % (ref 12–46)
Lymphs Abs: 2.2 10*3/uL (ref 0.7–4.0)
MCH: 26.3 pg (ref 26.0–34.0)
MCHC: 32.8 g/dL (ref 30.0–36.0)
MCV: 80.2 fL (ref 78.0–100.0)
MPV: 9.5 fL (ref 8.6–12.4)
Monocytes Absolute: 0.7 10*3/uL (ref 0.1–1.0)
Monocytes Relative: 7 % (ref 3–12)
Neutro Abs: 7.5 10*3/uL (ref 1.7–7.7)
Neutrophils Relative %: 71 % (ref 43–77)
Platelets: 412 10*3/uL — ABNORMAL HIGH (ref 150–400)
RBC: 4.99 MIL/uL (ref 3.87–5.11)
RDW: 15.5 % (ref 11.5–15.5)
WBC: 10.5 10*3/uL (ref 4.0–10.5)

## 2014-11-10 LAB — TSH: TSH: 1.415 u[IU]/mL (ref 0.350–4.500)

## 2014-11-10 MED ORDER — OLMESARTAN MEDOXOMIL-HCTZ 20-12.5 MG PO TABS: 1.0000 | ORAL_TABLET | Freq: Every day | ORAL | Status: DC

## 2014-11-10 NOTE — Addendum Note (Signed)
Addended by: Thurnell Garbe A on: 11/10/2014 09:33 AM   Modules accepted: Orders, SmartSet

## 2014-11-10 NOTE — Progress Notes (Signed)
Mindy Collins 09-14-1970 [id number]   History:    44 y.o.  for annual gyn exam with complaints of occasional cyclical left lower quadrant pain during the time of ovulation. Patient's had past history of menorrhagia and had a Mirena IUD placed in June 2016 and reports that her cycles now have improved tremendously no intermenstrual spotting reported. Review of her record indicated in 2014 she had abdominal myomectomy as well as left ovarian cystectomy. 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 Review her records indicated that in 1993 she had cryotherapy for cervical dysplasia at another facility. In 2005 she was treated for external genital warts. Her Pap smear in 2012 10 2013 was normal.   Before the IUD was placed patient had an ultrasound which had demonstrated the following: Enlarged fibroid uterus with a measurement of 11.1 x 7.9 x 7.3 cm endometrial stripe of 12 mm. Patient has an intramural myoma measuring 29 x 30 mm the second 1 24 x 27 mm slightly decreased in size from previous scan last year. She had a thin-walled right ovarian cyst measuring 23 x 20 mm with no color-flow. The left ovary there was a tubular structure highly suspicious for hydrosalpinx with a measurement of 6.5 x 2.2 x 6.1 cm unable to use a Doppler due to her pendulous abdomen so was suboptimal was a report by the ultrasound tech. There was some fluid in the cul-de-sac 53 x 37 mm. Her CA 125 was in the normal range with a value of 25.  Patient not sexually active. Her mother was diagnosed with breast cancer but not certain if she has been tested for the BRCA one BRCA2 gene mutation. Patient requesting flu vaccine today. Patient requesting also HIV testing today as well.  Past medical history,surgical history, family history and social history were all reviewed and documented in the EPIC chart.  Gynecologic History Patient's last menstrual period was 10/18/2014. Contraception: IUD Last Pap:  2013. Results were: normal Last mammogram: 2015. Results were: normal  Obstetric History OB History  Gravida Para Term Preterm AB SAB TAB Ectopic Multiple Living  0                  ROS: A ROS was performed and pertinent positives and negatives are included in the history.  GENERAL: No fevers or chills. HEENT: No change in vision, no earache, sore throat or sinus congestion. NECK: No pain or stiffness. CARDIOVASCULAR: No chest pain or pressure. No palpitations. PULMONARY: No shortness of breath, cough or wheeze. GASTROINTESTINAL: No abdominal pain, nausea, vomiting or diarrhea, melena or bright red blood per rectum. GENITOURINARY: No urinary frequency, urgency, hesitancy or dysuria. MUSCULOSKELETAL: No joint or muscle pain, no back pain, no recent trauma. DERMATOLOGIC: No rash, no itching, no lesions. ENDOCRINE: No polyuria, polydipsia, no heat or cold intolerance. No recent change in weight. HEMATOLOGICAL: No anemia or easy bruising or bleeding. NEUROLOGIC: No headache, seizures, numbness, tingling or weakness. PSYCHIATRIC: No depression, no loss of interest in normal activity or change in sleep pattern.     Exam: chaperone present  BP 118/74 mmHg  Ht 5' 2.5" (1.588 m)  Wt 259 lb (117.482 kg)  BMI 46.59 kg/m2  LMP 10/18/2014  Body mass index is 46.59 kg/(m^2).  General appearance : Well developed well nourished female. No acute distress HEENT: Eyes: no retinal hemorrhage or exudates,  Neck supple, trachea midline, no carotid bruits, no thyroidmegaly Lungs: Clear to auscultation, no rhonchi or wheezes, or rib  retractions  Heart: Regular rate and rhythm, no murmurs or gallops Breast:Examined in sitting and supine position were symmetrical in appearance, no palpable masses or tenderness,  no skin retraction, no nipple inversion, no nipple discharge, no skin discoloration, no axillary or supraclavicular lymphadenopathy Abdomen: no palpable masses or tenderness, no rebound or  guarding Extremities: no edema or skin discoloration or tenderness  Pelvic:  Bartholin, Urethra, Skene Glands: Within normal limits             Vagina: No gross lesions or discharge  Cervix: No gross lesions or discharge  Uterus  10-12 weeks size nontender, normal size, shape and consistency, non-tender and mobile  Adnexa  Without masses or tenderness  Anus and perineum  normal   Rectovaginal  normal sphincter tone without palpated masses or tenderness             Hemoccult not indicated     Assessment/Plan:  44 y.o. female for annual exam patient doing well with Mirena IUD better cycle control now less heavy and regular. Patient overweight has been working on this issue by exercising healthier and exercising. She was weighing 271 pounds last year and currently weighing 259 pounds. Patient received the flu vaccine today. The following screening blood work was ordered: Comprehensive metabolic panel, fasting lipid profile, TSH, CBC, and urinalysis. We'll follow-up on her hydrosalpinx next year since her ultrasound was just a few months ago. We discussed importance of monthly self breast examination. Patient was also requesting an HIV screen today which will be obtained as well. Patient was reminded to schedule her mammogram. Prescription refill for her blood pressure medication was provided as well. Pap smear obtained today.   Terrance Mass MD, 9:11 AM 11/10/2014

## 2014-11-10 NOTE — Patient Instructions (Signed)
BRCA-1 and BRCA-2 BRCA-1 and BRCA-2 are 2 genes that are linked with hereditary breast and ovarian cancers. About 200,000 women are diagnosed with invasive breast cancer each year and about 23,000 with ovarian cancer (according to the American Cancer Society). Of these cancers, about 5% to 10% will be due to a mutation in one of the BRCA genes. Men can also inherit an increased risk of developing breast cancer, primarily from an alteration in the BRCA-2 gene.  Individuals with mutations in BRCA1 or BRCA2 have significantly elevated risks for breast cancer (up to 80% lifetime risk), ovarian cancer (up to 40% lifetime risk), bilateral breast cancer and other types of cancers. BRCA mutations are inherited and passed from generation to generation. One half of the time, they are passed from the father's side of the family.  The DNA in white blood cells is used to detect mutations in the BRCA genes. While the gene products (proteins) of the BRCA genes act only in breast and ovarian tissue, the genes are present in every cell of the body and blood is the most easily accessible source of that DNA. PREPARATION FOR TEST The test for BRCA mutations is done on a blood sample collected by needle from a vein in the arm. The test does not require surgical biopsy of breast or ovarian tissue.  NORMAL FINDINGS No genetic mutations. Ranges for normal findings may vary among different laboratories and hospitals. You should always check with your doctor after having lab work or other tests done to discuss the meaning of your test results and whether your values are considered within normal limits. MEANING OF TEST  Your caregiver will go over the test results with you and discuss the importance and meaning of your results, as well as treatment options and the need for additional tests if necessary. OBTAINING THE TEST RESULTS It is your responsibility to obtain your test results. Ask the lab or department performing the test  when and how you will get your results. OTHER THINGS TO KNOW Your test results may have implications for other family members. When one member of a family is tested for BRCA mutations, issues often arise about how or whether to share this information with other family members. Seek advice from a genetic counselor about communication of result with your family members.  Pre and post test consultation with a health care provider knowledgeable about genetic testing cannot be overemphasized.  There are many issues to be considered when preparing for a genetic test and upon learning the results, and a genetic counselor has the knowledge and experience to help you sort through them.  If the BRCA test is positive, the options include increased frequency of check-ups (e.g., mammography, blood tests for CA-125, or transvaginal ultrasonography); medications that could reduce risk (e.g., oral contraceptives or tamoxifen); or surgical removal of the ovaries or breasts. There are a number of variables involved and it is important to discuss your options with your doctor and genetic counselor. Research studies have reported that for every 1000 women negative for BRCA mutations, between 12 and 45 of them will develop breast cancer by age 50 and between 3 and 4 will develop ovarian cancer by age 50. The risk increases with age. The test can be ordered by a doctor, preferably by one who can also offer genetic counseling. The blood sample will be sent to a laboratory that specializes in BRCA testing. The American Society of Clinical Oncology and the National Breast Cancer Coalition encourage women seeking the   test to participate in long-term outcome studies to help gather information on the effectiveness of different check-up and treatment options. Document Released: 02/21/2004 Document Revised: 04/21/2011 Document Reviewed: 04/29/2013 [facility] Patient Information 2015 [place], [place]. This information is not intended to  replace advice given to you by your health care provider. Make sure you discuss any questions you have with your health care provider.

## 2014-11-11 LAB — URINALYSIS W MICROSCOPIC + REFLEX CULTURE
Bacteria, UA: NONE SEEN [HPF]
Bilirubin Urine: NEGATIVE
Casts: NONE SEEN [LPF]
Crystals: NONE SEEN [HPF]
Glucose, UA: NEGATIVE
Hgb urine dipstick: NEGATIVE
Ketones, ur: NEGATIVE
Leukocytes, UA: NEGATIVE
Nitrite: NEGATIVE
Protein, ur: NEGATIVE
Specific Gravity, Urine: 1.029 (ref 1.001–1.035)
Yeast: NONE SEEN [HPF]
pH: 5.5 (ref 5.0–8.0)

## 2014-11-11 LAB — HIV ANTIBODY (ROUTINE TESTING W REFLEX): HIV 1&2 Ab, 4th Generation: NONREACTIVE

## 2014-11-12 LAB — URINE CULTURE
Colony Count: NO GROWTH
Organism ID, Bacteria: NO GROWTH

## 2014-11-13 ENCOUNTER — Other Ambulatory Visit: Payer: Self-pay | Admitting: Gynecology

## 2014-11-13 DIAGNOSIS — E781 Pure hyperglyceridemia: Secondary | ICD-10-CM

## 2014-11-13 DIAGNOSIS — R7989 Other specified abnormal findings of blood chemistry: Secondary | ICD-10-CM

## 2014-11-13 LAB — CYTOLOGY - PAP

## 2014-12-18 ENCOUNTER — Encounter: Payer: BLUE CROSS/BLUE SHIELD | Admitting: Genetic Counselor

## 2014-12-18 ENCOUNTER — Other Ambulatory Visit: Payer: BLUE CROSS/BLUE SHIELD

## 2014-12-27 ENCOUNTER — Ambulatory Visit: Payer: BLUE CROSS/BLUE SHIELD | Admitting: Gynecology

## 2015-02-02 ENCOUNTER — Ambulatory Visit
Admission: RE | Admit: 2015-02-02 | Discharge: 2015-02-02 | Disposition: A | Discharge status: Home / Self Care | Payer: BLUE CROSS/BLUE SHIELD | Source: Ambulatory Visit

## 2015-02-02 DIAGNOSIS — Z1231 Encounter for screening mammogram for malignant neoplasm of breast: Secondary | ICD-10-CM

## 2015-02-11 ENCOUNTER — Other Ambulatory Visit: Payer: Self-pay | Admitting: Family Medicine

## 2015-02-14 ENCOUNTER — Ambulatory Visit: Payer: BLUE CROSS/BLUE SHIELD | Admitting: Gynecology

## 2015-02-16 ENCOUNTER — Other Ambulatory Visit: Payer: Self-pay

## 2015-02-16 ENCOUNTER — Telehealth: Payer: Self-pay | Admitting: *Deleted

## 2015-02-16 ENCOUNTER — Other Ambulatory Visit: Payer: Self-pay | Admitting: Gynecology

## 2015-02-16 MED ORDER — OLMESARTAN MEDOXOMIL-HCTZ 20-12.5 MG PO TABS: 1.0000 | ORAL_TABLET | Freq: Every day | ORAL | Status: DC

## 2015-02-16 NOTE — Telephone Encounter (Signed)
Pt called because her Rx for Benicar HCT 20-12.5 mg was denied stating she should follow up with Dr.Copeland, pt saw you on OV 11/10/14. You gave her Rx 30 day supply with 11 refills. Pt asked if you are not willing to prescribe medication as you did before? Please advise

## 2015-02-16 NOTE — Telephone Encounter (Signed)
Rx sent 

## 2015-02-16 NOTE — Telephone Encounter (Signed)
Given a refill sure she follows up with her internist as well

## 2015-02-16 NOTE — Telephone Encounter (Signed)
Dr. Edilia Bo has being treating her for her HTN. She needs to call her office for refill

## 2015-05-31 ENCOUNTER — Ambulatory Visit: Payer: BLUE CROSS/BLUE SHIELD

## 2015-07-24 ENCOUNTER — Other Ambulatory Visit: Payer: Self-pay | Admitting: Family Medicine

## 2015-09-03 ENCOUNTER — Ambulatory Visit (INDEPENDENT_AMBULATORY_CARE_PROVIDER_SITE_OTHER): Payer: BLUE CROSS/BLUE SHIELD | Admitting: Family Medicine

## 2015-09-03 VITALS — BP 128/90 | HR 95 | Temp 98.2°F | Resp 18 | Ht 62.5 in | Wt 271.0 lb

## 2015-09-03 DIAGNOSIS — I10 Essential (primary) hypertension: Secondary | ICD-10-CM

## 2015-09-03 DIAGNOSIS — M546 Pain in thoracic spine: Secondary | ICD-10-CM

## 2015-09-03 DIAGNOSIS — M5442 Lumbago with sciatica, left side: Secondary | ICD-10-CM | POA: Diagnosis not present

## 2015-09-03 DIAGNOSIS — M5441 Lumbago with sciatica, right side: Secondary | ICD-10-CM | POA: Diagnosis not present

## 2015-09-03 DIAGNOSIS — M62830 Muscle spasm of back: Secondary | ICD-10-CM

## 2015-09-03 DIAGNOSIS — Z5181 Encounter for therapeutic drug level monitoring: Secondary | ICD-10-CM

## 2015-09-03 MED ORDER — CYCLOBENZAPRINE HCL 10 MG PO TABS: 10.0000 mg | ORAL_TABLET | Freq: Three times a day (TID) | ORAL | 5 refills | Status: DC

## 2015-09-03 MED ORDER — MELOXICAM 15 MG PO TABS: 15.0000 mg | ORAL_TABLET | Freq: Every day | ORAL | 0 refills | Status: DC

## 2015-09-03 MED ORDER — CYCLOBENZAPRINE HCL ER 30 MG PO CP24: 30.0000 mg | ORAL_CAPSULE | Freq: Every day | ORAL | 0 refills | Status: DC | PRN

## 2015-09-03 MED ORDER — OLMESARTAN MEDOXOMIL-HCTZ 20-12.5 MG PO TABS: 1.0000 | ORAL_TABLET | Freq: Every day | ORAL | 5 refills | Status: DC

## 2015-09-03 NOTE — Patient Instructions (Addendum)
[facility] has a new weight loss clinic in [place] on [place]. So far, they seem to be getting good results and since it is medically based your insurance might provide better coverage.  It is probably worth checking to see what your ins coverage is. It is called "By Design" and you can call [phone] option 1 for more info.  Think about a diet and exercise regimen you could get behind and you think would work with your lifestyle, then we can add in weight loss medication as long as we keep a close eye on your heart and blood pressure while you are on it.  IF you received an x-ray today, you will receive an invoice from [facility]. Please contact [facility] at [phone] with questions or concerns regarding your invoice.   IF you received labwork today, you will receive an invoice from [employer]. Please contact [facility] at [phone] with questions or concerns regarding your invoice.   Our billing staff will not be able to assist you with questions regarding bills from these companies.  You will be contacted with the lab results as soon as they are available. The fastest way to get your results is to activate your My Chart account. Instructions are located on the last page of this paperwork. If you have not heard from [place] regarding the results in 2 weeks, please contact this office.     Exercising to Lose Weight Exercising can help you to lose weight. In order to lose weight through exercise, you need to do vigorous-intensity exercise. You can tell that you are exercising with vigorous intensity if you are breathing very hard and fast and cannot hold a conversation while exercising. Moderate-intensity exercise helps to maintain your current weight. You can tell that you are exercising at a moderate level if you have a higher heart rate and faster breathing, but you are still able to hold a conversation. HOW OFTEN SHOULD  I EXERCISE? Choose an activity that you enjoy and set realistic goals. Your health care provider can help you to make an activity plan that works for you. Exercise regularly as directed by your health care provider. This may include:  Doing resistance training twice each week, such as:  Push-ups.  Sit-ups.  Lifting weights.  Using resistance bands.  Doing a given intensity of exercise for a given amount of time. Choose from these options:  150 minutes of moderate-intensity exercise every week.  75 minutes of vigorous-intensity exercise every week.  A mix of moderate-intensity and vigorous-intensity exercise every week. Children, pregnant women, people who are out of shape, people who are overweight, and older adults may need to consult a health care provider for individual recommendations. If you have any sort of medical condition, be sure to consult your health care provider before starting a new exercise program. WHAT ARE SOME ACTIVITIES THAT CAN HELP ME TO LOSE WEIGHT?   Walking at a rate of at least 4.5 miles an hour.  Jogging or running at a rate of 5 miles per hour.  Biking at a rate of at least 10 miles per hour.  Lap swimming.  Roller-skating or in-line skating.  Cross-country skiing.  Vigorous competitive sports, such as football, basketball, and soccer.  Jumping rope.  Aerobic dancing. HOW CAN I BE MORE ACTIVE IN MY DAY-TO-DAY ACTIVITIES?  Use the stairs instead of the elevator.  Take a walk during your lunch break.  If you drive, park your car farther away  from work or school.  If you take public transportation, get off one stop early and walk the rest of the way.  Make all of your phone calls while standing up and walking around.  Get up, stretch, and walk around every 30 minutes throughout the day. WHAT GUIDELINES SHOULD I FOLLOW WHILE EXERCISING?  Do not exercise so much that you hurt yourself, feel dizzy, or get very short of breath.  Consult  your health care provider prior to starting a new exercise program.  Wear comfortable clothes and shoes with good support.  Drink plenty of water while you exercise to prevent dehydration or heat stroke. Body water is lost during exercise and must be replaced.  Work out until you breathe faster and your heart beats faster.   This information is not intended to replace advice given to you by your health care provider. Make sure you discuss any questions you have with your health care provider.   Document Released: 03/01/2010 Document Revised: 02/17/2014 Document Reviewed: 06/30/2013 Elsevier Interactive Patient Education 2016 [employer]    Muscle Cramps and Spasms Muscle cramps and spasms occur when a muscle or muscles tighten and you have no control over this tightening (involuntary muscle contraction). They are a common problem and can develop in any muscle. The most common place is in the calf muscles of the leg. Both muscle cramps and muscle spasms are involuntary muscle contractions, but they also have differences:   Muscle cramps are sporadic and painful. They may last a few seconds to a quarter of an hour. Muscle cramps are often more forceful and last longer than muscle spasms.  Muscle spasms may or may not be painful. They may also last just a few seconds or much longer. CAUSES  It is uncommon for cramps or spasms to be due to a serious underlying problem. In many cases, the cause of cramps or spasms is unknown. Some common causes are:   Overexertion.   Overuse from repetitive motions (doing the same thing over and over).   Remaining in a certain position for a long period of time.   Improper preparation, form, or technique while performing a sport or activity.   Dehydration.   Injury.   Side effects of some medicines.   Abnormally low levels of the salts and ions in your blood (electrolytes), especially potassium and calcium. This could happen if you are  taking water pills (diuretics) or you are pregnant.  Some underlying medical problems can make it more likely to develop cramps or spasms. These include, but are not limited to:   Diabetes.   Parkinson disease.   Hormone disorders, such as thyroid problems.   Alcohol abuse.   Diseases specific to muscles, joints, and bones.   Blood vessel disease where not enough blood is getting to the muscles.  HOME CARE INSTRUCTIONS   Stay well hydrated. Drink enough water and fluids to keep your urine clear or pale yellow.  It may be helpful to massage, stretch, and relax the affected muscle.  For tight or tense muscles, use a warm towel, heating pad, or hot shower water directed to the affected area.  If you are sore or have pain after a cramp or spasm, applying ice to the affected area may relieve discomfort.  Put ice in a plastic bag.  Place a towel between your skin and the bag.  Leave the ice on for 15-20 minutes, 03-04 times a day.  Medicines used to treat a known cause of  cramps or spasms may help reduce their frequency or severity. Only take over-the-counter or prescription medicines as directed by your caregiver. SEEK MEDICAL CARE IF:  Your cramps or spasms get more severe, more frequent, or do not improve over time.  MAKE SURE YOU:   Understand these instructions.  Will watch your condition.  Will get help right away if you are not doing well or get worse.   This information is not intended to replace advice given to you by your health care provider. Make sure you discuss any questions you have with your health care provider.   Document Released: 07/19/2001 Document Revised: 05/24/2012 Document Reviewed: 01/14/2012 Elsevier Interactive Patient Education [employer].

## 2015-09-03 NOTE — Progress Notes (Signed)
Subjective:  By signing my name below, I, Mindy Collins, attest that this documentation has been prepared under the direction and in the presence of Mindy Cheadle, MD. Electronically Signed: Moises Collins, [place]. 09/03/2015 , 6:21 PM .  Patient was seen in Room 9 .   Patient ID: Mindy Collins, female    DOB: 1970-07-30, 45 y.o.   MRN: [id number] Chief Complaint  Patient presents with  . Spasms    Chronic    HPI Mindy Collins is a 45 y.o. female who presents to [facility] complaining of chronic muscle spasms in her back.  Patient states she's still dealing with back pain located in the middle of her thoracic spine. She hasn't taken much of the pain medications but does have to take the muscle relaxants. She went to the campus doctor about 2~3 weeks ago and was prescribed prednisone taper and given exercises. She still "feels crampy" throughout the day and tightening when she lays down in the evening, needing to do stretches. She informs that the exercises help improve but would also have spasms in her stomach too. She mentions pulling a charlie horse when she tried to do some of the stretches last night. She denies taking OTC medications. She does take multi-vitamins. She denies any urinary symptoms.   Patient complains being under a lot of stress lately, causing her to gain weight. She notes being down to 250 lbs at one point, but now she's back to about 270 lbs. She noticed increased appetite "where [she] can eat a whole pizza by herself", prior to taking the prednisone. She's been stress eating, and decreased her exercise. She's tried "Weight Watchers" without any assistance, and also a dietician with meal replacement for some difference. She's considered gastric surgery. She mentions noticing her breasts size have slightly increased since mirena IUD. Her last GYN visit was in Sept 2016.   Wt Readings from Last 3 Encounters:  09/03/15 271 lb (122.9 kg)  11/10/14 259 lb (117.5 kg)  10/31/14 260 lb (117.9  kg)   She's been having a lot of stress with taking care of her mother, who is still going through chemo. She is going through schooling and recently her job was terminated. She denies having anxiety. She notes having trouble sleeping due to "being wired" with trying to find work and mild depression. She believes her BP was slightly elevated during triage due to the complications she ran into while being checked in.   Patient also reports receiving a ventolin inhaler from the health center on campus for wheezing. She only noticed needing it on "hot days", and using the inhaler as needed. She denies using it today. She denies being diagnosed with asthma.   Past Medical History:  Diagnosis Date  . Anemia   . Carpal tunnel syndrome, bilateral   . Colitis   . Dysmenorrhea   . Fatty liver   . GERD (gastroesophageal reflux disease)   . HPV (human papilloma virus) infection    WARTS/ TCA [place]  . Hypertension   . Obesity   . Obesity   . Seasonal allergies    Prior to Admission medications   Medication Sig Start Date End Date Taking? Authorizing Provider  cyclobenzaprine (FLEXERIL) 10 MG tablet Take 10 mg by mouth 3 (three) times daily.   Yes Historical Provider, MD  HYDROcodone-acetaminophen (NORCO/VICODIN) 5-325 MG per tablet Take 1 tablet by mouth every 6 (six) hours as needed for moderate pain.   Yes Historical Provider, MD  levonorgestrel (MIRENA) 20  MCG/24HR IUD 1 each by Intrauterine route once.   Yes Historical Provider, MD  loratadine (CLARITIN) 10 MG tablet Take 10 mg by mouth daily.   Yes Historical Provider, MD  meloxicam (MOBIC) 15 MG tablet Take 1 tablet (15 mg total) by mouth daily. Take    With food, no other NSAIDs 09/27/14  Yes Thao P Le, DO  Multiple Vitamin (MULTIVITAMIN) tablet Take 1 tablet by mouth daily.   Yes Historical Provider, MD  olmesartan-hydrochlorothiazide (BENICAR HCT) 20-12.5 MG tablet Take 1 tablet by mouth daily. 02/16/15  Yes Terrance Mass, MD   Allergies    Allergen Reactions  . Esomeprazole Magnesium Shortness Of Breath    Nervousness; tolerates Aciphex.  . Adhesive [Tape] Itching and Rash    Itching and rash with adhesive tape  . Latex Itching and Dermatitis    Skin blistering  . Nexium [Esomeprazole]    Review of Systems  Constitutional: Positive for appetite change and fatigue. Negative for chills and fever.  Respiratory: Negative for wheezing.   Musculoskeletal: Positive for back pain. Negative for arthralgias, gait problem, joint swelling and myalgias.  Psychiatric/Behavioral: Positive for dysphoric mood and sleep disturbance. The patient is not nervous/anxious.        Objective:   Physical Exam  Constitutional: She is oriented to person, place, and time. She appears well-developed and well-nourished. No distress.  HENT:  Head: Normocephalic and atraumatic.  Eyes: EOM are normal. Pupils are equal, round, and reactive to light.  Neck: Neck supple.  Cardiovascular: Regular rhythm.  Tachycardia present.   Pulmonary/Chest: Effort normal and breath sounds normal. No respiratory distress.  Musculoskeletal: Normal range of motion.  No point tenderness over thoracic spine, some muscle spasms over the bilateral scapula and distal to the scapula  Neurological: She is alert and oriented to person, place, and time.  Skin: Skin is warm and dry.  Psychiatric: She has a normal mood and affect. Her behavior is normal.  Nursing note and vitals reviewed.   BP 128/90   Pulse 95   Temp 98.2 F (36.8 C) (Oral)   Resp 18   Ht 5' 2.5" (1.588 m)   Wt 271 lb (122.9 kg)   SpO2 100%   BMI 48.78 kg/m     Assessment & Plan:   1. Essential hypertension - diastolic bp mildly elevated but will hold off on increasing benicare-hct since pt is going to work on tlc.  2. Bilateral low back pain with sciatica, sciatica laterality unspecified   3. Back spasm - advised that she may hugetly benefit from a breast reduction - Her breasts are extremely  notably large and pendulous and is clearly a large part (if not the whole reason) that she is having chronic thoracic back pain/spasms. Pt would prefer to focus on systemic weightloss instead.  Try amirex instead of flexeril when spasms are not relieved by sev days of short-acting cyclobenzaprine qhs.  Pt using meloxicam sparingly due HTN - used 30 tabs/yr, refilled.  Still has hydrocodone 5mg  at home - rx'ed #30 no refills 18 mos prior so clearly not abusing - ok to refill prn.  4. Medication monitoring encounter   5. Midline thoracic back pain   6.      Morbid obesity - reviewed different diet options in detail. Rec looking into the [place] "by design" program.  Would be willing to consider use of wellbutrin or phentermine to aide in diet compliance but she needs to figure out exactly how she is going  to diet and exercise prior to starting and will need close f/u due to HTN.  Suggested to pt that she may want to look into gastric bypass which she will consider.  Orders Placed This Encounter  Procedures  . Comprehensive metabolic panel  . CK  . Magnesium  . Sedimentation rate  . C-reactive protein    Meds ordered this encounter  Medications  . cyclobenzaprine (FLEXERIL) 10 MG tablet    Sig: Take 1 tablet (10 mg total) by mouth 3 (three) times daily.    Dispense:  30 tablet    Refill:  5  . olmesartan-hydrochlorothiazide (BENICAR HCT) 20-12.5 MG tablet    Sig: Take 1 tablet by mouth daily.    Dispense:  30 tablet    Refill:  5  . DISCONTD: meloxicam (MOBIC) 15 MG tablet    Sig: Take 1 tablet (15 mg total) by mouth daily. with food, no other NSAIDs    Dispense:  30 tablet    Refill:  0  . cyclobenzaprine (AMRIX) 30 MG 24 hr capsule    Sig: Take 1 capsule (30 mg total) by mouth daily as needed for muscle spasms.    Dispense:  30 capsule    Refill:  0  . meloxicam (MOBIC) 15 MG tablet    Sig: Take 1 tablet (15 mg total) by mouth daily. with food, no other NSAIDs    Dispense:  30  tablet    Refill:  0    I personally performed the services described in this documentation, which was scribed in my presence. The recorded information has been reviewed and considered, and addended by me as needed.   Mindy Collins, M.D.  [facility] [address] [place], [place] [address] [phone] phone [phone] fax  09/03/15 11:13 PM

## 2015-09-04 LAB — COMPREHENSIVE METABOLIC PANEL
ALT: 24 U/L (ref 6–29)
AST: 20 U/L (ref 10–30)
Albumin: 4.3 g/dL (ref 3.6–5.1)
Alkaline Phosphatase: 111 U/L (ref 33–115)
BUN: 10 mg/dL (ref 7–25)
CO2: 30 mmol/L (ref 20–31)
Calcium: 9.6 mg/dL (ref 8.6–10.2)
Chloride: 100 mmol/L (ref 98–110)
Creat: 0.8 mg/dL (ref 0.50–1.10)
Glucose, Bld: 91 mg/dL (ref 65–99)
Potassium: 3.7 mmol/L (ref 3.5–5.3)
Sodium: 141 mmol/L (ref 135–146)
Total Bilirubin: 1 mg/dL (ref 0.2–1.2)
Total Protein: 7.5 g/dL (ref 6.1–8.1)

## 2015-09-04 LAB — CK: Total CK: 86 U/L (ref 7–177)

## 2015-09-04 LAB — MAGNESIUM: Magnesium: 2 mg/dL (ref 1.5–2.5)

## 2015-09-04 LAB — SEDIMENTATION RATE: Sed Rate: 14 mm/hr (ref 0–20)

## 2015-09-04 LAB — C-REACTIVE PROTEIN: CRP: 1.8 mg/dL — ABNORMAL HIGH (ref <0.60)

## 2015-12-21 ENCOUNTER — Ambulatory Visit (INDEPENDENT_AMBULATORY_CARE_PROVIDER_SITE_OTHER): Payer: BLUE CROSS/BLUE SHIELD

## 2015-12-21 ENCOUNTER — Ambulatory Visit (INDEPENDENT_AMBULATORY_CARE_PROVIDER_SITE_OTHER): Payer: BLUE CROSS/BLUE SHIELD | Admitting: Family Medicine

## 2015-12-21 VITALS — BP 126/80 | HR 106 | Temp 98.4°F | Resp 18 | Ht 62.5 in | Wt 278.0 lb

## 2015-12-21 DIAGNOSIS — R519 Headache, unspecified: Secondary | ICD-10-CM

## 2015-12-21 DIAGNOSIS — R51 Headache: Secondary | ICD-10-CM

## 2015-12-21 DIAGNOSIS — R002 Palpitations: Secondary | ICD-10-CM

## 2015-12-21 LAB — POCT CBC
Granulocyte percent: 75.4 %G (ref 37–80)
HCT, POC: 40.6 % (ref 37.7–47.9)
Hemoglobin: 14.1 g/dL (ref 12.2–16.2)
Lymph, poc: 3.6 — AB (ref 0.6–3.4)
MCH, POC: 28.1 pg (ref 27–31.2)
MCHC: 34.7 g/dL (ref 31.8–35.4)
MCV: 80.9 fL (ref 80–97)
MID (cbc): 0.6 (ref 0–0.9)
MPV: 7.7 fL (ref 0–99.8)
POC Granulocyte: 13 — AB (ref 2–6.9)
POC LYMPH PERCENT: 21 %L (ref 10–50)
POC MID %: 3.6 %M (ref 0–12)
Platelet Count, POC: 362 10*3/uL (ref 142–424)
RBC: 5.02 M/uL (ref 4.04–5.48)
RDW, POC: 14.8 %
WBC: 17.2 10*3/uL — AB (ref 4.6–10.2)

## 2015-12-21 MED ORDER — BUTALBITAL-APAP-CAFFEINE 50-325-40 MG PO TABS: 1.0000 | ORAL_TABLET | Freq: Four times a day (QID) | ORAL | 0 refills | Status: DC | PRN

## 2015-12-21 NOTE — Patient Instructions (Addendum)
IF you received an x-ray today, you will receive an invoice from [facility]. Please contact [facility] at [phone] with questions or concerns regarding your invoice.   IF you received labwork today, you will receive an invoice from [employer]. Please contact [facility] at [phone] with questions or concerns regarding your invoice.   Our billing staff will not be able to assist you with questions regarding bills from these companies.  You will be contacted with the lab results as soon as they are available. The fastest way to get your results is to activate your My Chart account. Instructions are located on the last page of this paperwork. If you have not heard from [place] regarding the results in 2 weeks, please contact this office.     Tension Headache A tension headache is a feeling of pain, pressure, or aching that is often felt over the front and sides of the head. The pain can be dull, or it can feel tight (constricting). Tension headaches are not normally associated with nausea or vomiting, and they do not get worse with physical activity. Tension headaches can last from 30 minutes to several days. This is the most common type of headache. CAUSES The exact cause of this condition is not known. Tension headaches often begin after stress, anxiety, or depression. Other triggers may include:  Alcohol.  Too much caffeine, or caffeine withdrawal.  Respiratory infections, such as colds, flu, or sinus infections.  Dental problems or teeth clenching.  Fatigue.  Holding your head and neck in the same position for a long period of time, such as while using a computer.  Smoking. SYMPTOMS Symptoms of this condition include:  A feeling of pressure around the head.  Dull, aching head pain.  Pain felt over the front and sides of the head.  Tenderness in the muscles of the head, neck, and shoulders. DIAGNOSIS This condition may be  diagnosed based on your symptoms and a physical exam. Tests may be done, such as a CT scan or an MRI of your head. These tests may be done if your symptoms are severe or unusual. TREATMENT This condition may be treated with lifestyle changes and medicines to help relieve symptoms. HOME CARE INSTRUCTIONS Managing Pain  Take over-the-counter and prescription medicines only as told by your health care provider.  Lie down in a dark, quiet room when you have a headache.  If directed, apply ice to the head and neck area:  Put ice in a plastic bag.  Place a towel between your skin and the bag.  Leave the ice on for 20 minutes, 2-3 times per day.  Use a heating pad or a hot shower to apply heat to the head and neck area as told by your health care provider. Eating and Drinking  Eat meals on a regular schedule.  Limit alcohol use.  Decrease your caffeine intake, or stop using caffeine. General Instructions  Keep all follow-up visits as told by your health care provider. This is important.  Keep a headache journal to help find out what may trigger your headaches. For example, write down:  What you eat and drink.  How much sleep you get.  Any change to your diet or medicines.  Try massage or other relaxation techniques.  Limit stress.  Sit up straight, and avoid tensing your muscles.  Do not use tobacco products, including cigarettes, chewing tobacco, or e-cigarettes. If you need help quitting, ask your health care provider.  Exercise regularly as told by your health care provider.  Get 7-9 hours of sleep, or the amount recommended by your health care provider. SEEK MEDICAL CARE IF:  Your symptoms are not helped by medicine.  You have a headache that is different from what you normally experience.  You have nausea or you vomit.  You have a fever. SEEK IMMEDIATE MEDICAL CARE IF:  Your headache becomes severe.  You have repeated vomiting.  You have a stiff  neck.  You have a loss of vision.  You have problems with speech.  You have pain in your eye or ear.  You have muscular weakness or loss of muscle control.  You lose your balance or you have trouble walking.  You feel faint or you pass out.  You have confusion.   This information is not intended to replace advice given to you by your health care provider. Make sure you discuss any questions you have with your health care provider.   Document Released: 01/27/2005 Document Revised: 10/18/2014 Document Reviewed: 05/22/2014 Elsevier Interactive Patient Education [employer].

## 2015-12-21 NOTE — Progress Notes (Signed)
Subjective:  By signing my name below, I, Mindy Collins, attest that this documentation has been prepared under the direction and in the presence of Mindy Cheadle, MD Electronically Signed: Ladene Artist, ED Scribe 12/21/2015 at 6:01 PM.   Patient ID: Mindy Collins, female    DOB: [birth year], 45 y.o.   MRN: [id number]  Chief Complaint  Patient presents with  . Headache    right side  . Neck Pain   HPI: HPI Comments: Mindy Collins is a 45 y.o. female who presents to the [facility] complaining of gradually worsening right-sided temporal HAs for the past month. Pt reports a sharp, shooting pain in the right temple that tends to self-resolve with lying down. She reports associated symptoms of right jaw pain, intermittent rhinorrhea on the right and sneezing, fatigue longer than 1-2 weeks, crackling in her neck, especially with rotation, pain behind her right eye with slight visual changes to the right eye first noticed over the past few days. Pt is not due to see the eye doctor until January. Pt reports a h/o keratoconus. She admits to increased stress related to school and states that she has noticed that she has waken up biting her tongue and with chest palpitations following very vivid dreams. She has been seen by an on-campus provider for the same and was started on a taper course of Prednisone. She has also tried Flonase without significant relief.   Past Medical History:  Diagnosis Date  . Anemia   . Carpal tunnel syndrome, bilateral   . Colitis   . Dysmenorrhea   . Fatty liver   . GERD (gastroesophageal reflux disease)   . HPV (human papilloma virus) infection    WARTS/ TCA [place]  . Hypertension   . Obesity   . Obesity   . Seasonal allergies    Current Outpatient Prescriptions on File Prior to Visit  Medication Sig Dispense Refill  . cyclobenzaprine (AMRIX) 30 MG 24 hr capsule Take 1 capsule (30 mg total) by mouth daily as needed for muscle spasms. 30 capsule 0  .  levonorgestrel (MIRENA) 20 MCG/24HR IUD 1 each by Intrauterine route once.    . loratadine (CLARITIN) 10 MG tablet Take 10 mg by mouth daily.    . meloxicam (MOBIC) 15 MG tablet Take 1 tablet (15 mg total) by mouth daily. with food, no other NSAIDs 30 tablet 0  . Multiple Vitamin (MULTIVITAMIN) tablet Take 1 tablet by mouth daily.    Marland Kitchen olmesartan-hydrochlorothiazide (BENICAR HCT) 20-12.5 MG tablet Take 1 tablet by mouth daily. 30 tablet 5  . cyclobenzaprine (FLEXERIL) 10 MG tablet Take 1 tablet (10 mg total) by mouth 3 (three) times daily. (Patient not taking: Reported on 12/21/2015) 30 tablet 5  . HYDROcodone-acetaminophen (NORCO/VICODIN) 5-325 MG per tablet Take 1 tablet by mouth every 6 (six) hours as needed for moderate pain.     No current facility-administered medications on file prior to visit.     Allergies  Allergen Reactions  . Esomeprazole Magnesium Shortness Of Breath    Nervousness; tolerates Aciphex.  . Adhesive [Tape] Itching and Rash    Itching and rash with adhesive tape  . Latex Itching and Dermatitis    Skin blistering  . Nexium [Esomeprazole]     Review of Systems  Constitutional: Positive for fatigue.  HENT: Positive for rhinorrhea, sinus pressure and sneezing.   Eyes: Positive for visual disturbance.  Musculoskeletal: Positive for neck pain.  Neurological: Positive for headaches.   BP 126/80 (  BP Location: Right Arm, Patient Position: Sitting, Cuff Size: Large)   Pulse (!) 114   Temp 98.4 F (36.9 C) (Oral)   Resp 18   Ht 5' 2.5" (1.588 m)   Wt 278 lb (126.1 kg)   SpO2 98%   BMI 50.04 kg/m     Objective:   Physical Exam  Constitutional: She is oriented to person, place, and time. She appears well-developed and well-nourished. No distress.  HENT:  Head: Normocephalic and atraumatic.  Right Ear: Tympanic membrane is erythematous. A middle ear effusion is present.  Left Ear: Tympanic membrane is erythematous. A middle ear effusion is present.    Mouth/Throat: Oropharynx is clear and moist and mucous membranes are normal.  Nares with erythema.  Pressure sensation with palpation of the R temporal artery. Tenderness over the R mastoid process and R TMJ.   Eyes: Conjunctivae and EOM are normal.  Neck: Neck supple. No tracheal deviation present.  Cardiovascular: Normal rate.   Pulmonary/Chest: Effort normal. No respiratory distress.  Musculoskeletal: Normal range of motion.  Neurological: She is alert and oriented to person, place, and time.  Skin: Skin is warm and dry.  Psychiatric: She has a normal mood and affect. Her behavior is normal.  Nursing note and vitals reviewed.   Visual Acuity Screening   Right eye Left eye Both eyes  Without correction:     With correction: 20/25 20/25 20/25   Comments: Peripheral vision  85 degrees both eyes  Dg Sinuses Complete  Result Date: 12/21/2015 CLINICAL DATA:  Right frontal and temporal headaches EXAM: PARANASAL SINUSES - COMPLETE 3 + VIEW COMPARISON:  None. FINDINGS: Nasal septum is midline. Imaged paranasal sinuses are grossly clear and without mucosal thickening or fluid level. No obvious fracture. IMPRESSION: Negative. Electronically Signed   By: Donavan Foil M.D.   On: 12/21/2015 18:54   Results for orders placed or performed in visit on 12/21/15  Sedimentation Rate  Result Value Ref Range   Sed Rate 6 0 - 20 mm/hr  C-reactive protein  Result Value Ref Range   CRP 15.2 (H) <8.0 mg/L  TSH  Result Value Ref Range   TSH 1.40 mIU/L  POCT CBC  Result Value Ref Range   WBC 17.2 (A) 4.6 - 10.2 K/uL   Lymph, poc 3.6 (A) 0.6 - 3.4   POC LYMPH PERCENT 21.0 10 - 50 %L   MID (cbc) 0.6 0 - 0.9   POC MID % 3.6 0 - 12 %M   POC Granulocyte 13.0 (A) 2 - 6.9   Granulocyte percent 75.4 37 - 80 %G   RBC 5.02 4.04 - 5.48 M/uL   Hemoglobin 14.1 12.2 - 16.2 g/dL   HCT, POC 40.6 37.7 - 47.9 %   MCV 80.9 80 - 97 fL   MCH, POC 28.1 27 - 31.2 pg   MCHC 34.7 31.8 - 35.4 g/dL   RDW, POC 14.8 %    Platelet Count, POC 362 142 - 424 K/uL   MPV 7.7 0 - 99.8 fL       Assessment & Plan:   1. Right temporal headache   2. Palpitations    Suspect leukocytosis is secondary to recent prednisone use - just finished 2d prior - was a 6 day 60->10 taper. Suspect HAs are MSK/tension.   Orders Placed This Encounter  Procedures  . DG Sinuses Complete    Standing Status:   Future    Number of Occurrences:   1    Standing Expiration  Date:   12/20/2016    Order Specific Question:   Reason for Exam (SYMPTOM  OR DIAGNOSIS REQUIRED)    Answer:   right frontal/temporal headaches, chronic sinusitis    Order Specific Question:   Is the patient pregnant?    Answer:   No    Order Specific Question:   Preferred imaging location?    Answer:   External  . Sedimentation Rate  . C-reactive protein  . TSH  . POCT CBC    Meds ordered this encounter  Medications  . butalbital-acetaminophen-caffeine (FIORICET, ESGIC) 50-325-40 MG tablet    Sig: Take 1-2 tablets by mouth every 6 (six) hours as needed for headache.    Dispense:  20 tablet    Refill:  0    I personally performed the services described in this documentation, which was scribed in my presence. The recorded information has been reviewed and considered, and addended by me as needed.   Mindy Collins, M.D.  [facility] [address] [place], [place] [address] [phone] phone [phone] fax  12/30/15 2:03 AM

## 2015-12-22 LAB — SEDIMENTATION RATE: Sed Rate: 6 mm/hr (ref 0–20)

## 2015-12-23 LAB — TSH: TSH: 1.4 mIU/L

## 2015-12-24 LAB — C-REACTIVE PROTEIN: CRP: 15.2 mg/L — ABNORMAL HIGH (ref <8.0)

## 2016-01-25 ENCOUNTER — Other Ambulatory Visit: Payer: Self-pay | Admitting: Gynecology

## 2016-01-25 DIAGNOSIS — Z1231 Encounter for screening mammogram for malignant neoplasm of breast: Secondary | ICD-10-CM

## 2016-02-05 ENCOUNTER — Ambulatory Visit (INDEPENDENT_AMBULATORY_CARE_PROVIDER_SITE_OTHER): Payer: BLUE CROSS/BLUE SHIELD | Admitting: Physician Assistant

## 2016-02-05 ENCOUNTER — Encounter: Payer: Self-pay | Admitting: Physician Assistant

## 2016-02-05 VITALS — BP 128/86 | HR 99 | Temp 99.4°F | Resp 18 | Ht 62.0 in | Wt 278.0 lb

## 2016-02-05 DIAGNOSIS — R040 Epistaxis: Secondary | ICD-10-CM | POA: Diagnosis not present

## 2016-02-05 NOTE — Progress Notes (Signed)
Patient ID: Mindy Collins, female    DOB: Aug 09, 1970, 45 y.o.   MRN: [id number]  PCP: [name], MD  Chief Complaint  Patient presents with  . Epistaxis    possibly due to incorrectly taking nasal spray, about 2 weeks    Subjective:   Presents for evaluation of nose bleeds.  R>L Some bloody mucous with blowing nose. No blood dripping from the nares. Was using OTC Flonase 2 sprays Q nostril QD, but then got a prescription for it (01/17/2016, from [facility]), and didn't realize that the instructions were 1 spray Q nostril BID. Concerned that the bleeding may be due to using the spray inocrrectly.  No fever/chills. Sneezing.  Has been having more frontal headaches. One episode of dizziness.  Is taking Xyzal  Review of Systems As above.    Patient Active Problem List   Diagnosis Date Noted  . Thrombocytosis ([place]) 07/31/2014  . IUD (intrauterine device) in place 07/31/2014  . DUB (dysfunctional uterine bleeding) 07/27/2014  . Intramural leiomyoma of uterus 06/30/2014  . Hydrosalpinx 06/30/2014  . Back pain 05/30/2014  . Morbid obesity ([place]) 08/21/2013  . HYPERTENSION, BENIGN 04/05/2010     Prior to Admission medications   Medication Sig Start Date End Date Taking? Authorizing Provider  cyclobenzaprine (AMRIX) 30 MG 24 hr capsule Take 1 capsule (30 mg total) by mouth daily as needed for muscle spasms. 09/03/15  Yes Shawnee Knapp, MD  levonorgestrel (MIRENA) 20 MCG/24HR IUD 1 each by Intrauterine route once.   Yes Historical Provider, MD  meloxicam (MOBIC) 15 MG tablet Take 1 tablet (15 mg total) by mouth daily. with food, no other NSAIDs 09/03/15  Yes Shawnee Knapp, MD  Multiple Vitamin (MULTIVITAMIN) tablet Take 1 tablet by mouth daily.   Yes Historical Provider, MD  olmesartan-hydrochlorothiazide (BENICAR HCT) 20-12.5 MG tablet Take 1 tablet by mouth daily. 09/03/15  Yes Shawnee Knapp, MD  butalbital-acetaminophen-caffeine (FIORICET, ESGIC) [phone] MG  tablet Take 1-2 tablets by mouth every 6 (six) hours as needed for headache. Patient not taking: Reported on 02/05/2016 12/21/15 12/20/16  Shawnee Knapp, MD  cyclobenzaprine (FLEXERIL) 10 MG tablet Take 1 tablet (10 mg total) by mouth 3 (three) times daily. Patient not taking: Reported on 02/05/2016 09/03/15   Shawnee Knapp, MD  fluticasone [facility]) 50 MCG/ACT nasal spray instill 1 spray into each nostril twice a day 01/17/16   Historical Provider, MD  levocetirizine (XYZAL) 5 MG tablet Take 5 mg by mouth at bedtime. 01/17/16   Historical Provider, MD      Allergies  Allergen Reactions  . Esomeprazole Magnesium Shortness Of Breath    Nervousness; tolerates Aciphex.  . Adhesive [Tape] Itching and Rash    Itching and rash with adhesive tape  . Latex Itching and Dermatitis    Skin blistering  . Nexium [Esomeprazole]        Objective:  Physical Exam  Constitutional: She is oriented to person, place, and time. She appears well-developed and well-nourished. She is active and cooperative. No distress.  BP 128/86 (BP Location: Left Arm, Patient Position: Sitting, Cuff Size: Large)   Pulse 99   Temp 99.4 F (37.4 C)   Resp 18   Ht 5\' 2"  (1.575 m)   Wt 278 lb (126.1 kg)   SpO2 96%   BMI 50.85 kg/m   HENT:  Head: Normocephalic and atraumatic.  Right Ear: Hearing, tympanic membrane, external ear and ear canal normal.  Left Ear: Hearing, tympanic  membrane, external ear and ear canal normal.  Nose: No mucosal edema, rhinorrhea, nose lacerations or sinus tenderness. Epistaxis is observed.  Mouth/Throat: Uvula is midline, oropharynx is clear and moist and mucous membranes are normal.  Dried blood and crusted mucous in both nostrils, R>L  Eyes: Conjunctivae are normal. No scleral icterus.  Neck: Normal range of motion. Neck supple. No thyromegaly present.  Cardiovascular: Normal rate, regular rhythm and normal heart sounds.   Pulses:      Radial pulses are 2+ on the right side, and 2+ on the  left side.  Pulmonary/Chest: Effort normal and breath sounds normal.  Lymphadenopathy:       Head (right side): No tonsillar, no preauricular, no posterior auricular and no occipital adenopathy present.       Head (left side): No tonsillar, no preauricular, no posterior auricular and no occipital adenopathy present.    She has no cervical adenopathy.       Right: No supraclavicular adenopathy present.       Left: No supraclavicular adenopathy present.  Neurological: She is alert and oriented to person, place, and time. No sensory deficit.  Skin: Skin is warm, dry and intact. No rash noted. No cyanosis or erythema. Nails show no clubbing.  Psychiatric: She has a normal mood and affect. Her speech is normal and behavior is normal.           Assessment & Plan:   1. Epistaxis Not severe, and likely due to dry and irritated nares. Continue current treatment for allergies. Add saline NS and Vaseline in the nares. Drink more water. RTC if symptoms worsen/persist.  2. Morbid obesity ([place]) As she was leaving she asked about weight loss options. Has not been successful so far on her own. Would like to see a weight loss specialist. - Amb Ref to Medical Weight Management   Fara Chute, PA-C Physician Assistant-Certified [facility]

## 2016-02-05 NOTE — Patient Instructions (Addendum)
Start a saline nasal spray. Also, apply a small amount of vaseline to the nostrils with your pinky finger. Continue the Flonase and levocetirizine. INCREASE your daily water intake to 64 ounces.    IF you received an x-ray today, you will receive an invoice from [facility]. Please contact [facility] at [phone] with questions or concerns regarding your invoice.   IF you received labwork today, you will receive an invoice from [facility]. Please contact [facility] at [phone] with questions or concerns regarding your invoice.   Our billing staff will not be able to assist you with questions regarding bills from these companies.  You will be contacted with the lab results as soon as they are available. The fastest way to get your results is to activate your My Chart account. Instructions are located on the last page of this paperwork. If you have not heard from [place] regarding the results in 2 weeks, please contact this office.

## 2016-02-21 ENCOUNTER — Encounter (INDEPENDENT_AMBULATORY_CARE_PROVIDER_SITE_OTHER): Payer: BLUE CROSS/BLUE SHIELD | Admitting: Family Medicine

## 2016-02-22 ENCOUNTER — Ambulatory Visit (INDEPENDENT_AMBULATORY_CARE_PROVIDER_SITE_OTHER): Payer: BLUE CROSS/BLUE SHIELD | Admitting: Gynecology

## 2016-02-22 ENCOUNTER — Encounter: Payer: Self-pay | Admitting: Gynecology

## 2016-02-22 VITALS — BP 124/78 | Ht 62.0 in | Wt 278.0 lb

## 2016-02-22 DIAGNOSIS — D251 Intramural leiomyoma of uterus: Secondary | ICD-10-CM

## 2016-02-22 DIAGNOSIS — Z01419 Encounter for gynecological examination (general) (routine) without abnormal findings: Secondary | ICD-10-CM | POA: Diagnosis not present

## 2016-02-22 DIAGNOSIS — N7011 Chronic salpingitis: Secondary | ICD-10-CM | POA: Diagnosis not present

## 2016-02-22 DIAGNOSIS — E663 Overweight: Secondary | ICD-10-CM

## 2016-02-22 DIAGNOSIS — Z23 Encounter for immunization: Secondary | ICD-10-CM

## 2016-02-22 LAB — COMPREHENSIVE METABOLIC PANEL
ALT: 37 U/L — ABNORMAL HIGH (ref 6–29)
AST: 29 U/L (ref 10–30)
Albumin: 4 g/dL (ref 3.6–5.1)
Alkaline Phosphatase: 96 U/L (ref 33–115)
BUN: 11 mg/dL (ref 7–25)
CO2: 29 mmol/L (ref 20–31)
Calcium: 9.3 mg/dL (ref 8.6–10.2)
Chloride: 103 mmol/L (ref 98–110)
Creat: 0.75 mg/dL (ref 0.50–1.10)
Glucose, Bld: 99 mg/dL (ref 65–99)
Potassium: 3.6 mmol/L (ref 3.5–5.3)
Sodium: 142 mmol/L (ref 135–146)
Total Bilirubin: 0.5 mg/dL (ref 0.2–1.2)
Total Protein: 7 g/dL (ref 6.1–8.1)

## 2016-02-22 LAB — TSH: TSH: 1.72 mIU/L

## 2016-02-22 LAB — LIPID PANEL
Cholesterol: 214 mg/dL — ABNORMAL HIGH (ref <200)
HDL: 45 mg/dL — ABNORMAL LOW (ref >50)
LDL Cholesterol: 148 mg/dL — ABNORMAL HIGH (ref <100)
Total CHOL/HDL Ratio: 4.8 Ratio (ref <5.0)
Triglycerides: 104 mg/dL (ref <150)
VLDL: 21 mg/dL (ref <30)

## 2016-02-22 NOTE — Addendum Note (Signed)
Addended by: Burnett Kanaris on: 02/22/2016 02:56 PM   Modules accepted: Orders

## 2016-02-22 NOTE — Patient Instructions (Addendum)
Bariatric Surgery Information Bariatric surgery, also called weight loss surgery, is a procedure that helps you lose weight. You may consider or your health care provider may suggest bariatric surgery if:  You are severely obese and have been unable to lose weight through diet and exercise.  You have health problems related to obesity, such as:  Type 2 diabetes.  Heart disease.  Lung disease. How does bariatric surgery help me lose weight? Bariatric surgery helps you lose weight by decreasing how much food your body absorbs. This is done by closing off part of your stomach to make it smaller. This restricts the amount of food your stomach can hold. Bariatric surgery can also change your body's regular digestive process, so that food bypasses the parts of your body that absorb calories and nutrients. If you decide to have bariatric surgery, it is important to continue to eat a healthy diet and exercise regularly after the surgery. What are the different kinds of bariatric surgery? There are two kinds of bariatric surgeries:  Restrictive surgeries make your stomach smaller. They do not change your digestive process. The smaller the size of your new stomach, the less food you can eat. There are different types of restrictive surgeries.  Malabsorptive surgeries both make your stomach smaller and alter your digestive process so that your body processes less calories and nutrients. These are the most common kind of bariatric surgery. There are different types of malabsorptive surgeries. What are the different types of restrictive surgery? Adjustable Gastric Banding  In this procedure, an inflatable band is placed around your stomach near the upper end. This makes the passageway for food into the rest of your stomach much smaller. The band can be adjusted, making it tighter or looser, by filling it with salt solution. Your surgeon can adjust the band based on how are you feeling and how much weight  you are losing. The band can be removed in the future. Vertical Banded Gastroplasty  In this procedure, staples are used to separate your stomach into two parts, a small upper pouch and a bigger lower pouch. This decreases how much food you can eat. Sleeve Gastrectomy  In this procedure, your stomach is made smaller. This is done by surgically removing a large part of your stomach. When your stomach is smaller, you feel full more quickly and reduce how much you eat. What are the different types of malabsorptive surgery? Roux-en-Y Gastric Bypass (RGB)  This is the most common weight loss surgery. In this procedure, a small stomach pouch is created in the upper part of your stomach. Next, this small stomach pouch is attached directly to the middle part of your small intestine. The farther down your small intestine the new connection is made, the fewer calories and nutrients you will absorb. Biliopancreatic Diversion with Duodenal Switch (BPD/DS)  This is a multi-step procedure. In this procedure, a large part of your stomach is removed, making your stomach smaller. Next, this smaller stomach is attached to the lower part of your small intestine. Like the RGB surgery, you absorb fewer calories and nutrients the farther down your small intestine the attachment is made. What are the risks of bariatric surgery? As with any surgical procedure, each type of bariatric surgery has its own risks. These risks also depend on your age, your overall health, and any other medical conditions you may have. When deciding on bariatric surgery, it is very important to:  Talk to your health care provider and choose the surgery that  is best for you.  Ask your health care provider about specific risks for the surgery you choose. Where to find more information:  American Society for Metabolic & Bariatric Surgery: www.asmbs.org  Weight-control Information Network (WIN): win.AmenCredit.is This information is not intended  to replace advice given to you by your health care provider. Make sure you discuss any questions you have with your health care provider. Document Released: 01/27/2005 Document Revised: 07/05/2015 Document Reviewed: 07/28/2012 Elsevier Interactive Patient Education  2017 Elsevier Inc. Influenza Virus Vaccine (Flucelvax) What is this medicine? INFLUENZA VIRUS VACCINE (in floo EN zuh VAHY ruhs vak SEEN) helps to reduce the risk of getting influenza also known as the flu. The vaccine only helps protect you against some strains of the flu. COMMON BRAND NAME(S): FLUCELVAX What should I tell my health care provider before I take this medicine? They need to know if you have any of these conditions: -bleeding disorder like hemophilia -fever or infection -Guillain-Barre syndrome or other neurological problems -immune system problems -infection with the human immunodeficiency virus (HIV) or AIDS -low blood platelet counts -multiple sclerosis -an unusual or allergic reaction to influenza virus vaccine, other medicines, foods, dyes or preservatives -pregnant or trying to get pregnant -breast-feeding How should I use this medicine? This vaccine is for injection into a muscle. It is given by a health care professional. A copy of Vaccine Information Statements will be given before each vaccination. Read this sheet carefully each time. The sheet may change frequently. Talk to your pediatrician regarding the use of this medicine in children. Special care may be needed. Overdosage: If you think you've taken too much of this medicine contact a poison control center or emergency room at once. What if I miss a dose? This does not apply. What may interact with this medicine? -chemotherapy or radiation therapy -medicines that lower your immune system like etanercept, anakinra, infliximab, and adalimumab -medicines that treat or prevent blood clots like warfarin -phenytoin -steroid medicines like prednisone  or cortisone -theophylline -vaccines What should I watch for while using this medicine? Report any side effects that do not go away within 3 days to your doctor or health care professional. Call your health care provider if any unusual symptoms occur within 6 weeks of receiving this vaccine. You may still catch the flu, but the illness is not usually as bad. You cannot get the flu from the vaccine. The vaccine will not protect against colds or other illnesses that may cause fever. The vaccine is needed every year. What side effects may I notice from receiving this medicine? Side effects that you should report to your doctor or health care professional as soon as possible: -allergic reactions like skin rash, itching or hives, swelling of the face, lips, or tongue Side effects that usually do not require medical attention (Report these to your doctor or health care professional if they continue or are bothersome.): -fever -headache -muscle aches and pains -pain, tenderness, redness, or swelling at the injection site -tiredness Where should I keep my medicine? The vaccine will be given by a health care professional in a clinic, pharmacy, doctor's office, or other health care setting. You will not be given vaccine doses to store at home.  2017 Elsevier/Gold Standard (2011-01-08 14:06:47)

## 2016-02-22 NOTE — Progress Notes (Signed)
Mindy Collins 05-Aug-1970 [id number]   History:    46 y.o.  for annual gyn exam with no major complaints today. She is doing well since the IUD was placed in 2016 and reports very minimal if any menses. Review of her record indicated in 2014 she had abdominal myomectomy as well as left ovarian cystectomy. The cyst was a benign serous cystadenoma and benign fibroid.Review her records indicated that in 1993 she had cryotherapy for cervical dysplasia at another facility. In 2005 she was treated for external genital warts.Before the IUD was placed patient had an ultrasound which had demonstrated the following: Enlarged fibroid uterus with a measurement of 11.1 x 7.9 x 7.3 cm endometrial stripe of 12 mm. Patient has an intramural myoma measuring 29 x 30 mm the second 1 24 x 27 mm slightly decreased in size from previous scan last year. She had a thin-walled right ovarian cyst measuring 23 x 20 mm with no color-flow. The left ovary there was a tubular structure highly suspicious for hydrosalpinx with a measurement of 6.5 x 2.2 x 6.1 cm unable to use a Doppler due to her pendulous abdomen so was suboptimal was a report by the ultrasound tech. There was some fluid in the cul-de-sac 53 x 37 mm. Her CA 125 was in the normal range with a value of 25.  Patient not sexually active. Her mother was diagnosed with breast cancer but not certain if she has been tested for the BRCA one BRCA2 gene mutation. Patient requesting flu vaccine today.     Past medical history,surgical history, family history and social history were all reviewed and documented in the EPIC chart.  Gynecologic History Patient's last menstrual period was 12/27/2014 (approximate). Contraception: IUD Last Pap: 2016. Results were: normal Last mammogram: 2016. Results were: normal  Obstetric History OB History  Gravida Para Term Preterm AB Living  0            SAB TAB Ectopic Multiple Live Births                    ROS: A ROS was  performed and pertinent positives and negatives are included in the history.  GENERAL: No fevers or chills. HEENT: No change in vision, no earache, sore throat or sinus congestion. NECK: No pain or stiffness. CARDIOVASCULAR: No chest pain or pressure. No palpitations. PULMONARY: No shortness of breath, cough or wheeze. GASTROINTESTINAL: No abdominal pain, nausea, vomiting or diarrhea, melena or bright red blood per rectum. GENITOURINARY: No urinary frequency, urgency, hesitancy or dysuria. MUSCULOSKELETAL: No joint or muscle pain, no back pain, no recent trauma. DERMATOLOGIC: No rash, no itching, no lesions. ENDOCRINE: No polyuria, polydipsia, no heat or cold intolerance. No recent change in weight. HEMATOLOGICAL: No anemia or easy bruising or bleeding. NEUROLOGIC: No headache, seizures, numbness, tingling or weakness. PSYCHIATRIC: No depression, no loss of interest in normal activity or change in sleep pattern.     Exam: chaperone present  BP 124/78   Ht 5' 2" (1.575 m)   Wt 278 lb (126.1 kg)   LMP 12/27/2014 (Approximate) Comment: iud no period  BMI 50.85 kg/m   Body mass index is 50.85 kg/m.  General appearance : Well developed well nourished female. No acute distress HEENT: Eyes: no retinal hemorrhage or exudates,  Neck supple, trachea midline, no carotid bruits, no thyroidmegaly Lungs: Clear to auscultation, no rhonchi or wheezes, or rib retractions  Heart: Regular rate and rhythm, no murmurs or gallops Breast:Examined in sitting and  supine position were symmetrical in appearance, no palpable masses or tenderness,  no skin retraction, no nipple inversion, no nipple discharge, no skin discoloration, no axillary or supraclavicular lymphadenopathy Abdomen: no palpable masses or tenderness, no rebound or guarding Extremities: no edema or skin discoloration or tenderness  Pelvic:  Bartholin, Urethra, Skene Glands: Within normal limits             Vagina: No gross lesions or  discharge  Cervix: No gross lesions or discharge, IUD string not visualized  Uterus  limited due to patient's obesity abdominal girth  Adnexa same as above  Anus and perineum  normal   Rectovaginal  normal sphincter tone without palpated masses or tenderness             Hemoccult not indicated     Assessment/Plan:  46 y.o. female for annual exam will return to the office next week for an ultrasound in effort to follow-up on her hydrosalpinx and small fibroid and to better assess her ovaries as well and uterus since it was very limited due to patient's abdominal girth. I've given her literature information on bariatric surgery for her to make an appointment for consultation with the general surgeons since she weighs 270 pounds has a BMI 50.85 kg/m and has gained 19 pounds is last year. The following screening fasting blood work will be drawn today: Fasting lipid profile, TSH, comprehensive metabolic panel, CBC, and urinalysis. Pap smear not indicated this year. Patient received the flu vaccine today after she was counseled.   Terrance Mass MD, 12:03 PM 02/22/2016

## 2016-02-23 LAB — CBC WITH DIFFERENTIAL/PLATELET
Basophils Absolute: 0 cells/uL (ref 0–200)
Basophils Relative: 0 %
Eosinophils Absolute: 93 cells/uL (ref 15–500)
Eosinophils Relative: 1 %
HCT: 40.4 % (ref 35.0–45.0)
Hemoglobin: 12.8 g/dL (ref 11.7–15.5)
Lymphocytes Relative: 20 %
Lymphs Abs: 1860 cells/uL (ref 850–3900)
MCH: 26.4 pg — ABNORMAL LOW (ref 27.0–33.0)
MCHC: 31.7 g/dL — ABNORMAL LOW (ref 32.0–36.0)
MCV: 83.5 fL (ref 80.0–100.0)
MPV: 9.9 fL (ref 7.5–12.5)
Monocytes Absolute: 651 cells/uL (ref 200–950)
Monocytes Relative: 7 %
Neutro Abs: 6696 cells/uL (ref 1500–7800)
Neutrophils Relative %: 72 %
Platelets: 385 10*3/uL (ref 140–400)
RBC: 4.84 MIL/uL (ref 3.80–5.10)
RDW: 15.5 % — ABNORMAL HIGH (ref 11.0–15.0)
WBC: 9.3 10*3/uL (ref 3.8–10.8)

## 2016-02-23 LAB — URINALYSIS W MICROSCOPIC + REFLEX CULTURE
Bacteria, UA: NONE SEEN [HPF]
Bilirubin Urine: NEGATIVE
Casts: NONE SEEN [LPF]
Crystals: NONE SEEN [HPF]
Glucose, UA: NEGATIVE
Hgb urine dipstick: NEGATIVE
Ketones, ur: NEGATIVE
Leukocytes, UA: NEGATIVE
Nitrite: NEGATIVE
Protein, ur: NEGATIVE
Specific Gravity, Urine: 1.015 (ref 1.001–1.035)
WBC, UA: NONE SEEN WBC/HPF (ref <=5)
Yeast: NONE SEEN [HPF]
pH: 7 (ref 5.0–8.0)

## 2016-02-24 LAB — URINE CULTURE

## 2016-02-26 ENCOUNTER — Other Ambulatory Visit: Payer: Self-pay | Admitting: Gynecology

## 2016-02-26 ENCOUNTER — Ambulatory Visit
Admission: RE | Admit: 2016-02-26 | Discharge: 2016-02-26 | Disposition: A | Discharge status: Home / Self Care | Payer: BLUE CROSS/BLUE SHIELD | Source: Ambulatory Visit | Attending: Gynecology | Admitting: Gynecology

## 2016-02-26 DIAGNOSIS — Z1231 Encounter for screening mammogram for malignant neoplasm of breast: Secondary | ICD-10-CM

## 2016-02-26 DIAGNOSIS — R945 Abnormal results of liver function studies: Principal | ICD-10-CM

## 2016-02-26 DIAGNOSIS — R7989 Other specified abnormal findings of blood chemistry: Secondary | ICD-10-CM

## 2016-02-27 ENCOUNTER — Ambulatory Visit: Payer: BLUE CROSS/BLUE SHIELD | Admitting: Gynecology

## 2016-02-27 ENCOUNTER — Other Ambulatory Visit: Payer: BLUE CROSS/BLUE SHIELD

## 2016-03-01 ENCOUNTER — Encounter (INDEPENDENT_AMBULATORY_CARE_PROVIDER_SITE_OTHER): Payer: BLUE CROSS/BLUE SHIELD | Admitting: Family Medicine

## 2016-03-03 ENCOUNTER — Ambulatory Visit (INDEPENDENT_AMBULATORY_CARE_PROVIDER_SITE_OTHER): Payer: BLUE CROSS/BLUE SHIELD

## 2016-03-03 ENCOUNTER — Other Ambulatory Visit: Payer: Self-pay | Admitting: Gynecology

## 2016-03-03 ENCOUNTER — Ambulatory Visit (INDEPENDENT_AMBULATORY_CARE_PROVIDER_SITE_OTHER): Payer: BLUE CROSS/BLUE SHIELD | Admitting: Gynecology

## 2016-03-03 VITALS — BP 124/82

## 2016-03-03 DIAGNOSIS — N949 Unspecified condition associated with female genital organs and menstrual cycle: Secondary | ICD-10-CM | POA: Diagnosis not present

## 2016-03-03 DIAGNOSIS — D251 Intramural leiomyoma of uterus: Secondary | ICD-10-CM | POA: Diagnosis not present

## 2016-03-03 DIAGNOSIS — N852 Hypertrophy of uterus: Secondary | ICD-10-CM

## 2016-03-03 DIAGNOSIS — N9489 Other specified conditions associated with female genital organs and menstrual cycle: Secondary | ICD-10-CM

## 2016-03-03 DIAGNOSIS — N7011 Chronic salpingitis: Secondary | ICD-10-CM

## 2016-03-03 NOTE — Patient Instructions (Signed)
CA-125 Tumor Marker Test Why am I having this test? This test is used to check the level of cancer antigen 125 (CA-125) in your blood. The CA-125 tumor marker test can be helpful in detecting ovarian cancer. The test is only performed if you are considered at high risk for ovarian cancer. Your health care provider may recommend this test if:  You have a strong family history of ovarian cancer.  You have a breast cancer antigen (BRCA) genetic defect. If you have already been diagnosed with ovarian cancer, your health care provider may use this test to help identify the extent of the disease and to monitor your response to treatment. What kind of sample is taken? A blood sample is required for this test. It is usually collected by inserting a needle into a vein. How do I prepare for this test? There is no preparation required for this test. What are the reference ranges? Reference ranges are considered healthy ranges established after testing a large group of healthy people. Reference ranges may vary among different people, labs, and hospitals. It is your responsibility to obtain your test results. Ask the lab or department performing the test when and how you will get your results. The reference range for this test is 0-35 units/mL or less than 35 kunits/L (SI units). What do the results mean? Increased levels of CA-125 may indicate:  Certain types of cancer, including:  Ovarian cancer.  Pancreatic cancer.  Colon cancer.  Lung cancer.  Breast cancer.  Lymphoma.  Noncancerous (benign) disorders, including:  Cirrhosis.  Pregnancy.  Endometriosis.  Pancreatitis.  Pelvic inflammatory disease (PID). Talk with your health care provider to discuss your results, treatment options, and if necessary, the need for more tests. Talk with your health care provider if you have any questions about your results. Talk with your health care provider to discuss your results, treatment options,  and if necessary, the need for more tests. Talk with your health care provider if you have any questions about your results. This information is not intended to replace advice given to you by your health care provider. Make sure you discuss any questions you have with your health care provider. Document Released: 02/19/2004 Document Revised: 10/02/2015 Document Reviewed: 06/16/2013 Elsevier Interactive Patient Education  2017 Elsevier Inc.  

## 2016-03-03 NOTE — Progress Notes (Signed)
   Patient is a 46 year old that presented to the office today to discuss her ultrasound. She was seen the office for annual exam earlier this month.She is doing well since the IUD was placed in 2016 and reports very minimal if any menses.Review of her record indicated in 2014 she had abdominal myomectomy as well as left ovarian cystectomy. The cyst was a benign serous cystadenoma and benign fibroid. Patient had an ultrasound 2016 which demonstrated the following:  Enlarged fibroid uterus with a measurement of 11.1 x 7.9 x 7.3 cm endometrial stripe of 12 mm. Patient has an intramural myoma measuring 29 x 30 mm the second 1 24 x 27 mm slightly decreased in size from previous scan last year. She had a thin-walled right ovarian cyst measuring 23 x 20 mm with no color-flow. The left ovary there was a tubular structure highly suspicious for hydrosalpinx with a measurement of 6.5 x 2.2 x 6.1 cm unable to use a Doppler due to her pendulous abdomen so was suboptimal was a report by the ultrasound tech. There was some fluid in the cul-de-sac 53 x 37 mm The fundus area  Today's ultrasound demonstrated the following: Uterus measured 10.2 x 6.2 x 6.1 cm with endometrial stripe of 5.1 mm. Intramural fibroids 30 x 23 mm and 34 x 24 mm respectively. IUD was seen in the uterine cavity and normal position. Both right and left ovary were not seen. A right adnexal cystic mass serpentine shaped with septations was reported with a measurement of 8.5 x 4.8 x 7.3 cm which was not seen on previous exam. No fluid in the cul-de-sac. No apparent adnexal masses otherwise. Previous left tubular mass was not seen. Patient with pendulous abdomen.  Assessment/plan: For clear delineation of this adnexal mass patient be scheduled for CT with and without contrast in the next few weeks. She will then return to the office 2-3 days after the CT scan to discuss her results and plan a course of management. She is overweight with pendulous  abdomen. We are also going to check a CA 125 today its limitations were discussed.  Greater than 90% time was spent counseling and coordinate care for this patient. Time of consultation 15 minutes

## 2016-03-04 LAB — CA 125: CA 125: 16 U/mL (ref <35)

## 2016-03-06 ENCOUNTER — Telehealth: Payer: Self-pay | Admitting: *Deleted

## 2016-03-06 ENCOUNTER — Telehealth: Payer: Self-pay

## 2016-03-06 DIAGNOSIS — N9489 Other specified conditions associated with female genital organs and menstrual cycle: Secondary | ICD-10-CM

## 2016-03-06 NOTE — Telephone Encounter (Signed)
-----   Message from Terrance Mass, MD sent at 03/03/2016 11:09 AM EST ----- Anderson Malta, please schedule CT scan with and without contrast for this patient with a right adnexal mass. Also make an appointment for her to see me 2-3 days after the CT scan

## 2016-03-06 NOTE — Telephone Encounter (Signed)
8 cm right adnexal cystic mass you can do the CT scan without contrast

## 2016-03-06 NOTE — Telephone Encounter (Signed)
Pt called back to let me know she received my message

## 2016-03-06 NOTE — Telephone Encounter (Signed)
The adnexal mass diagnosis will not precertify at all. Has nothing to do with the contrast order. Rep suggested changing to pelvic mass (?) .

## 2016-03-06 NOTE — Telephone Encounter (Signed)
Left a detailed message with time and date of scan on pt voicemail.

## 2016-03-06 NOTE — Telephone Encounter (Signed)
I tried to authorize CT of pelvis with and without contrast with Adnexal Mass diagnosis from visit and scheduling. I was given the choices of: Is this a hematoma or hemorrhage? Right or left quadrant pain?  When I answered "no" to those questions the rep said we could not go any further with that diagnosis because those are the only criteria for that diagnosis. Unable to authorize it.  He asked if we might want to try a different diagnosis?

## 2016-03-06 NOTE — Telephone Encounter (Signed)
Ct scan of pelvis w/wo contrast scheduled on 03/12/16 @ 1:15pm at [facility] radiology, liquids only 4 hours prior to scan left message for pt to call.

## 2016-03-07 ENCOUNTER — Telehealth: Payer: Self-pay

## 2016-03-07 NOTE — Telephone Encounter (Signed)
I added this auth # to her appt notes.

## 2016-03-07 NOTE — Telephone Encounter (Signed)
I spoke with Abigail Butts at [facility]. She authorized CT scan with diagnosis pelvic mass R19.00. Authorization number is [id number] valid from today until 04/05/16.

## 2016-03-07 NOTE — Telephone Encounter (Signed)
Pelvic mass will be fine thank you

## 2016-03-10 ENCOUNTER — Ambulatory Visit (INDEPENDENT_AMBULATORY_CARE_PROVIDER_SITE_OTHER): Payer: Self-pay | Admitting: Family Medicine

## 2016-03-11 ENCOUNTER — Other Ambulatory Visit: Payer: BLUE CROSS/BLUE SHIELD

## 2016-03-11 DIAGNOSIS — R7989 Other specified abnormal findings of blood chemistry: Secondary | ICD-10-CM

## 2016-03-11 DIAGNOSIS — R945 Abnormal results of liver function studies: Principal | ICD-10-CM

## 2016-03-11 LAB — ALT: ALT: 32 U/L — ABNORMAL HIGH (ref 6–29)

## 2016-03-11 LAB — AST: AST: 22 U/L (ref 10–30)

## 2016-03-12 ENCOUNTER — Other Ambulatory Visit: Payer: Self-pay | Admitting: Gynecology

## 2016-03-12 ENCOUNTER — Ambulatory Visit (HOSPITAL_COMMUNITY)
Admission: RE | Admit: 2016-03-12 | Discharge: 2016-03-12 | Disposition: A | Discharge status: Home / Self Care | Payer: BLUE CROSS/BLUE SHIELD | Source: Ambulatory Visit | Attending: Gynecology | Admitting: Gynecology

## 2016-03-12 DIAGNOSIS — N949 Unspecified condition associated with female genital organs and menstrual cycle: Secondary | ICD-10-CM | POA: Diagnosis present

## 2016-03-12 DIAGNOSIS — N9489 Other specified conditions associated with female genital organs and menstrual cycle: Secondary | ICD-10-CM

## 2016-03-12 DIAGNOSIS — R1909 Other intra-abdominal and pelvic swelling, mass and lump: Secondary | ICD-10-CM | POA: Diagnosis present

## 2016-03-12 DIAGNOSIS — R7989 Other specified abnormal findings of blood chemistry: Secondary | ICD-10-CM

## 2016-03-12 DIAGNOSIS — R945 Abnormal results of liver function studies: Principal | ICD-10-CM

## 2016-03-12 MED ORDER — SODIUM CHLORIDE 0.9 % IJ SOLN
INTRAMUSCULAR | Status: AC
  Filled 2016-03-12: qty 50

## 2016-03-12 MED ORDER — IOPAMIDOL (ISOVUE-300) INJECTION 61%
INTRAVENOUS | Status: AC
  Administered 2016-03-12: 100 mL
  Filled 2016-03-12: qty 100

## 2016-03-14 ENCOUNTER — Encounter: Payer: Self-pay | Admitting: Gynecology

## 2016-03-14 ENCOUNTER — Ambulatory Visit (INDEPENDENT_AMBULATORY_CARE_PROVIDER_SITE_OTHER): Payer: BLUE CROSS/BLUE SHIELD | Admitting: Gynecology

## 2016-03-14 VITALS — BP 126/82 | Ht 62.0 in | Wt 278.0 lb

## 2016-03-14 DIAGNOSIS — S301XXA Contusion of abdominal wall, initial encounter: Secondary | ICD-10-CM

## 2016-03-14 DIAGNOSIS — T888XXA Other specified complications of surgical and medical care, not elsewhere classified, initial encounter: Secondary | ICD-10-CM | POA: Diagnosis not present

## 2016-03-14 DIAGNOSIS — T792XXA Traumatic secondary and recurrent hemorrhage and seroma, initial encounter: Secondary | ICD-10-CM | POA: Diagnosis not present

## 2016-03-14 NOTE — Progress Notes (Signed)
Patient is a 46 year old that presented to the office today to discuss her recent CT scan. Her history is as follows:  Patient was seen the office on 03/03/2016 to discuss her pelvic ultrasound. She had previous symptoms seen earlier in the year for her annual exam.She is doing well since the IUD was placed in 2016 and reports very minimal if any menses.Review of her record indicated in 2014 she had abdominal myomectomy as well as left ovarian cystectomy. The cyst was a benign serous cystadenoma and benign fibroid. Patient had an ultrasound 2016 which demonstrated the following:  Enlarged fibroid uterus with a measurement of 11.1 x 7.9 x 7.3 cm endometrial stripe of 12 mm. Patient has an intramural myoma measuring 29 x 30 mm the second 1 24 x 27 mm slightly decreased in size from previous scan last year. She had a thin-walled right ovarian cyst measuring 23 x 20 mm with no color-flow. The left ovary there was a tubular structure highly suspicious for hydrosalpinx with a measurement of 6.5 x 2.2 x 6.1 cm unable to use a Doppler due to her pendulous abdomen so was suboptimal was a report by the ultrasound tech. There was some fluid in the cul-de-sac 53 x 37 mm The fundus area  Today's ultrasound demonstrated the following: Uterus measured 10.2 x 6.2 x 6.1 cm with endometrial stripe of 5.1 mm. Intramural fibroids 30 x 23 mm and 34 x 24 mm respectively. IUD was seen in the uterine cavity and normal position. Both right and left ovary were not seen. A right adnexal cystic mass serpentine shaped with septations was reported with a measurement of 8.5 x 4.8 x 7.3 cm which was not seen on previous exam. No fluid in the cul-de-sac. No apparent adnexal masses otherwise. Previous left tubular mass was not seen. Patient with pendulous abdomen.  Her CA 125 was 16  CT scan report demonstrated the following: Reproductive: An IUD is noted in the endometrial canal. Probable small fibroid involving the anterior  myometrium. The cervix is grossly normal. I believe I can identify both ovaries and they appear normal. There is a complex cystic lesion noted in the anterior aspect of the pelvis. This has both intra and extra  abdominal components. Part of the cystic structure is anterior to the right rectus muscle. It is smoothly marginated and well circumscribed. It measures approximately 8.7 x 5.5 cm. It measures approximately 28 Hounsfield units. I most likely a postoperative fluid collection, liquified hematoma or seroma. I do not see any worrisome CT imaging findings. Followup ultrasound examination in 6 months may be useful to reassess for any enlargement.  Other:  No other significant findings.  Musculoskeletal: No significant bony findings.  IMPRESSION: Complex fluid collection identified on outside ultrasound (not available for comparison) appears to correspond to a probable postoperative fluid collection involving the anterior pelvic wall. Please see above discussion.  No other significant pelvic findings.  Her case was discussed with the GYN oncologist Dr. Everitt Amber and she had commented : "I'm not sure I would "follow" it with scans though if it is asymptomatic.  I don't think it needs intervention if it is asymptomatic"  This information was relayed to the patient. She's totally asymptomatic. She's in the process of talking with a general surgeon for possible gastric bypass one year for her annual exam and an ultrasound could be ordered at that time as well or sooner she has any symptoms.  Gravida 90% time was spent counseling correlating care for  this patient. Time of consultation 15 minutes

## 2016-05-11 ENCOUNTER — Other Ambulatory Visit: Payer: Self-pay | Admitting: Family Medicine

## 2016-06-25 ENCOUNTER — Encounter: Payer: Self-pay | Admitting: Gynecology

## 2016-06-28 ENCOUNTER — Other Ambulatory Visit: Payer: Self-pay | Admitting: Gynecology

## 2016-07-02 NOTE — Progress Notes (Signed)
Subjective:    Patient ID: Mindy Collins, female    DOB: 01-08-1971, 46 y.o.   MRN: [id number]   Chief Complaint  Patient presents with  . Medication Refill    Benicar HCT 20-12.5 MG    HPI  Mindy Collins is a delightful 46 yo woman here today to address her HTN.  I last saw her 6 mo and 10 mos prior for acute neck and back pain respectively.  HTN: On benicar-hctz 20-12.5 - she has not run out but very close. She is monitoring outside the office and running 120s/70s.   Obesity: tried "Weight Watchers" without any assistance, and also a dietician with meal replacement for some difference. She's considered gastric surgery. Breasts still growing larger so discussed breast reduction prior as does have recurrent back pain but pt appropriately concerned that this does not address the root of her problem of systemic weight gain so advised she consider surgical options which she has done, had general surgery c/s. Does stress eat.  Last yr had pt look into the [facility] "by design" program and was then referred to [facility] weight mngmnt clinic with Dr. Shary Decamp but pt no showed then canceled twice. She has not been able to find a program that would take her insurance though admits she didn't fee.   Last yr her mom was going through chemo. Last yr Mindy Collins's job was terminated so she went back to school.  HLD: improved some 4 mos ago - total cholesterol was down to 214 from 237 the year prior and LDL cholesterol improved at 148 down from 163.  Developed some RAD during summer last yr for which she used prn albuterol. + seasonal allergies.  Anemia:  GERD: Fatty liver:  ALT has been mildly elevated - gyn following. Has had lots of pelvis imaging but last liver imaging was 7 yr ago 06/2009 when pt had colitis where CT stated liver normal. Lab Results  Component Value Date   ALT 32 (H) 03/11/2016   ALT 37 (H) 02/22/2016   ALT 24 09/03/2015   ALT 19 11/10/2014   ALT 23 05/30/2014    IBS:  Past  Medical History:  Diagnosis Date  . Anemia   . Carpal tunnel syndrome, bilateral   . Colitis   . Dysmenorrhea   . Fatty liver   . GERD (gastroesophageal reflux disease)   . HPV (human papilloma virus) infection    WARTS/ TCA [place]  . Hypertension   . Obesity   . Obesity   . Seasonal allergies    Past Surgical History:  Procedure Laterality Date  . GYNECOLOGIC CRYOSURGERY  1993   DYSPLASIA CERVIX/   . MYOMECTOMY  02/17/2011   Procedure: MYOMECTOMY;  Surgeon: Terrance Mass, MD;  Location: [facility];  Service: Gynecology;  Laterality: N/A;  I am hoping to get a 7:30am time on Dec 4, Dec 11 or Dec 12. It not available I will check on some 1:00 pm times. Thanks  . MYOMECTOMY ABDOMINAL APPROACH  04/31/2007  . OVARIAN CYST REMOVAL  02/17/2011   Procedure: OVARIAN CYSTECTOMY;  Surgeon: Terrance Mass, MD;  Location: [facility];  Service: Gynecology;  Laterality: Left;   Current Outpatient Prescriptions on File Prior to Visit  Medication Sig Dispense Refill  . cyclobenzaprine (AMRIX) 30 MG 24 hr capsule Take 1 capsule (30 mg total) by mouth daily as needed for muscle spasms. 30 capsule 0  . fluticasone (FLONASE) 50 MCG/ACT nasal spray instill 1 spray into each nostril  twice a day  0  . levonorgestrel (MIRENA) 20 MCG/24HR IUD 1 each by Intrauterine route once.    . Multiple Vitamin (MULTIVITAMIN) tablet Take 1 tablet by mouth daily.    . butalbital-acetaminophen-caffeine (FIORICET WITH CODEINE) 50-325-40-30 MG capsule Take 1 capsule by mouth every 4 (four) hours as needed for headache.    . levocetirizine (XYZAL) 5 MG tablet Take 5 mg by mouth at bedtime.  0  . meloxicam (MOBIC) 15 MG tablet Take 1 tablet (15 mg total) by mouth daily. with food, no other NSAIDs (Patient not taking: Reported on 07/03/2016) 30 tablet 0   No current facility-administered medications on file prior to visit.    Allergies  Allergen Reactions  . Esomeprazole Magnesium Shortness Of Breath    Nervousness; tolerates  Aciphex.  . Adhesive [Tape] Itching and Rash    Itching and rash with adhesive tape  . Latex Itching and Dermatitis    Skin blistering  . Levocetirizine Dihydrochloride Other (See Comments)    Muscle jerks  . Nexium [Esomeprazole]    Family History  Problem Relation Age of Onset  . Hypertension Mother   . Breast cancer Mother   . Cancer Mother   . Heart disease Maternal Grandmother   . Cancer Father        LUNG  . Breast cancer Maternal Aunt   . Diabetes Maternal Grandfather   . Cancer Paternal Aunt   . Ovarian cancer Paternal Aunt    Social History   Social History  . Marital status: Single    Spouse name: N/A  . Number of children: 0  . Years of education: N/A   Occupational History  . graduate student Student    [employer]   Social History Main Topics  . Smoking status: Never Smoker  . Smokeless tobacco: Never Used     Comment: denies tobacco   . Alcohol use No  . Drug use: No  . Sexual activity: Yes    Birth control/ protection: IUD   Other Topics Concern  . None   Social History Narrative   Graduate degree from [employer] in woman/gender studies.   Lives with her mother.   Depression screen [facility] 2/9 07/03/2016 02/05/2016 12/21/2015 09/03/2015 10/31/2014  Decreased Interest 0 0 0 0 0  Down, Depressed, Hopeless 0 0 0 0 0  PHQ - 2 Score 0 0 0 0 0    Review of Systems See hpi    Objective:   Physical Exam  Constitutional: She is oriented to person, place, and time. She appears well-developed and well-nourished. No distress.  HENT:  Head: Normocephalic and atraumatic.  Right Ear: External ear normal.  Left Ear: External ear normal.  Eyes: Conjunctivae are normal. No scleral icterus.  Neck: Normal range of motion. Neck supple. No thyromegaly present.  Cardiovascular: Normal rate, regular rhythm, normal heart sounds and intact distal pulses.   Pulmonary/Chest: Effort normal and breath sounds normal. No respiratory distress.  Musculoskeletal: She exhibits  no edema.  Lymphadenopathy:    She has no cervical adenopathy.  Neurological: She is alert and oriented to person, place, and time.  Skin: Skin is warm and dry. She is not diaphoretic. No erythema.  Psychiatric: She has a normal mood and affect. Her behavior is normal.    BP 140/87 (BP Location: Right Arm, Patient Position: Sitting, Cuff Size: Large)   Pulse 88   Temp 97.4 F (36.3 C)   Resp 18   Ht 5\' 2"  (1.575 m)   Wt  280 lb 12.8 oz (127.4 kg)   SpO2 97%   BMI 51.36 kg/m      Assessment & Plan:  Gyn did last - cbc, cmp, lipid, tsh, ua 4 mos prior  1. HYPERTENSION, BENIGN   2. Morbid obesity ([place])     Meds ordered this encounter  Medications  . loratadine (CLARITIN) 10 MG tablet    Sig: Take 10 mg by mouth daily.  Marland Kitchen olmesartan-hydrochlorothiazide (BENICAR HCT) 20-12.5 MG tablet    Sig: Take 1 tablet by mouth daily.    Dispense:  90 tablet    Refill:  3     [name], M.D.  Primary Care at [facility] [address] [place], [place] [address] [phone] phone [phone] fax  07/15/16 3:13 AM

## 2016-07-03 ENCOUNTER — Ambulatory Visit (INDEPENDENT_AMBULATORY_CARE_PROVIDER_SITE_OTHER): Payer: BLUE CROSS/BLUE SHIELD | Admitting: Family Medicine

## 2016-07-03 ENCOUNTER — Encounter: Payer: Self-pay | Admitting: Family Medicine

## 2016-07-03 VITALS — BP 140/87 | HR 88 | Temp 97.4°F | Resp 18 | Ht 62.0 in | Wt 280.8 lb

## 2016-07-03 DIAGNOSIS — I10 Essential (primary) hypertension: Secondary | ICD-10-CM | POA: Diagnosis not present

## 2016-07-03 MED ORDER — OLMESARTAN MEDOXOMIL-HCTZ 20-12.5 MG PO TABS
1.0000 | ORAL_TABLET | Freq: Every day | ORAL | 3 refills | Status: DC
Start: 2016-07-03 — End: 2016-10-03

## 2016-07-03 NOTE — Patient Instructions (Addendum)
We recommend that you schedule a mammogram for breast cancer screening. Typically, you do not need a referral to do this. Please contact a local imaging center to schedule your mammogram.  [facility] - [phone]  *ask for the Radiology Department [facility] - [phone] or [phone]  [facility] - [phone] [place] [phone] [facility] - [phone]  *ask for the [facility] - [phone]  *ask for the Radiology Department [facility] - [phone]  *ask for the [facility] - [phone]    IF you received an x-ray today, you will receive an invoice from [facility]. Please contact [facility] at [phone] with questions or concerns regarding your invoice.   IF you received labwork today, you will receive an invoice from [facility]. Please contact [facility] at [phone] with questions or concerns regarding your invoice.   Our billing staff will not be able to assist you with questions regarding bills from these companies.  You will be contacted with the lab results as soon as they are available. The fastest way to get your results is to activate your My Chart account. Instructions are located on the last page of this paperwork. If you have not heard from [place] regarding the results in 2 weeks, please contact this office.     Hypertension Hypertension, commonly called high blood pressure, is when the force of blood pumping through the arteries is too strong. The arteries are the blood vessels that carry blood from the heart throughout the body. Hypertension forces the heart to work harder to pump blood and may cause arteries to become narrow or stiff. Having untreated or uncontrolled hypertension can cause heart attacks, strokes, kidney disease, and other problems. A blood pressure reading  consists of a higher number over a lower number. Ideally, your blood pressure should be below 120/80. The first ("top") number is called the systolic pressure. It is a measure of the pressure in your arteries as your heart beats. The second ("bottom") number is called the diastolic pressure. It is a measure of the pressure in your arteries as the heart relaxes. What are the causes? The cause of this condition is not known. What increases the risk? Some risk factors for high blood pressure are under your control. Others are not. Factors you can change   Smoking.  Having type 2 diabetes mellitus, high cholesterol, or both.  Not getting enough exercise or physical activity.  Being overweight.  Having too much fat, sugar, calories, or salt (sodium) in your diet.  Drinking too much alcohol. Factors that are difficult or impossible to change   Having chronic kidney disease.  Having a family history of high blood pressure.  Age. Risk increases with age.  Race. You may be at higher risk if you are African-American.  Gender. Men are at higher risk than women before age 65. After age 4, women are at higher risk than men.  Having obstructive sleep apnea.  Stress. What are the signs or symptoms? Extremely high blood pressure (hypertensive crisis) may cause:  Headache.  Anxiety.  Shortness of breath.  Nosebleed.  Nausea and vomiting.  Severe chest pain.  Jerky movements you cannot control (seizures). How is this diagnosed? This condition is diagnosed by measuring your blood pressure while you are seated, with your arm resting on a surface. The cuff of the blood pressure monitor will be placed directly  against the skin of your upper arm at the level of your heart. It should be measured at least twice using the same arm. Certain conditions can cause a difference in blood pressure between your right and left arms. Certain factors can cause blood pressure readings to be lower or  higher than normal (elevated) for a short period of time:  When your blood pressure is higher when you are in a health care provider's office than when you are at home, this is called white coat hypertension. Most people with this condition do not need medicines.  When your blood pressure is higher at home than when you are in a health care provider's office, this is called masked hypertension. Most people with this condition may need medicines to control blood pressure. If you have a high blood pressure reading during one visit or you have normal blood pressure with other risk factors:  You may be asked to return on a different day to have your blood pressure checked again.  You may be asked to monitor your blood pressure at home for 1 week or longer. If you are diagnosed with hypertension, you may have other blood or imaging tests to help your health care provider understand your overall risk for other conditions. How is this treated? This condition is treated by making healthy lifestyle changes, such as eating healthy foods, exercising more, and reducing your alcohol intake. Your health care provider may prescribe medicine if lifestyle changes are not enough to get your blood pressure under control, and if:  Your systolic blood pressure is above 130.  Your diastolic blood pressure is above 80. Your personal target blood pressure may vary depending on your medical conditions, your age, and other factors. Follow these instructions at home: Eating and drinking   Eat a diet that is high in fiber and potassium, and low in sodium, added sugar, and fat. An example eating plan is called the DASH (Dietary Approaches to Stop Hypertension) diet. To eat this way:  Eat plenty of fresh fruits and vegetables. Try to fill half of your plate at each meal with fruits and vegetables.  Eat whole grains, such as whole wheat pasta, brown rice, or whole grain bread. Fill about one quarter of your plate with  whole grains.  Eat or drink low-fat dairy products, such as skim milk or low-fat yogurt.  Avoid fatty cuts of meat, processed or cured meats, and poultry with skin. Fill about one quarter of your plate with lean proteins, such as fish, chicken without skin, beans, eggs, and tofu.  Avoid premade and processed foods. These tend to be higher in sodium, added sugar, and fat.  Reduce your daily sodium intake. Most people with hypertension should eat less than 1,500 mg of sodium a day.  Limit alcohol intake to no more than 1 drink a day for nonpregnant women and 2 drinks a day for men. One drink equals 12 oz of beer, 5 oz of wine, or 1 oz of hard liquor. Lifestyle   Work with your health care provider to maintain a healthy body weight or to lose weight. Ask what an ideal weight is for you.  Get at least 30 minutes of exercise that causes your heart to beat faster (aerobic exercise) most days of the week. Activities may include walking, swimming, or biking.  Include exercise to strengthen your muscles (resistance exercise), such as pilates or lifting weights, as part of your weekly exercise routine. Try to do these types of exercises  for 30 minutes at least 3 days a week.  Do not use any products that contain nicotine or tobacco, such as cigarettes and e-cigarettes. If you need help quitting, ask your health care provider.  Monitor your blood pressure at home as told by your health care provider.  Keep all follow-up visits as told by your health care provider. This is important. Medicines   Take over-the-counter and prescription medicines only as told by your health care provider. Follow directions carefully. Blood pressure medicines must be taken as prescribed.  Do not skip doses of blood pressure medicine. Doing this puts you at risk for problems and can make the medicine less effective.  Ask your health care provider about side effects or reactions to medicines that you should watch  for. Contact a health care provider if:  You think you are having a reaction to a medicine you are taking.  You have headaches that keep coming back (recurring).  You feel dizzy.  You have swelling in your ankles.  You have trouble with your vision. Get help right away if:  You develop a severe headache or confusion.  You have unusual weakness or numbness.  You feel faint.  You have severe pain in your chest or abdomen.  You vomit repeatedly.  You have trouble breathing. Summary  Hypertension is when the force of blood pumping through your arteries is too strong. If this condition is not controlled, it may put you at risk for serious complications.  Your personal target blood pressure may vary depending on your medical conditions, your age, and other factors. For most people, a normal blood pressure is less than 120/80.  Hypertension is treated with lifestyle changes, medicines, or a combination of both. Lifestyle changes include weight loss, eating a healthy, low-sodium diet, exercising more, and limiting alcohol. This information is not intended to replace advice given to you by your health care provider. Make sure you discuss any questions you have with your health care provider. Document Released: 01/27/2005 Document Revised: 12/26/2015 Document Reviewed: 12/26/2015 Elsevier Interactive Patient Education  2017 [employer].

## 2016-07-04 ENCOUNTER — Other Ambulatory Visit: Payer: BLUE CROSS/BLUE SHIELD

## 2016-07-21 ENCOUNTER — Ambulatory Visit (INDEPENDENT_AMBULATORY_CARE_PROVIDER_SITE_OTHER): Payer: BLUE CROSS/BLUE SHIELD | Admitting: Urgent Care

## 2016-07-21 ENCOUNTER — Encounter: Payer: Self-pay | Admitting: Urgent Care

## 2016-07-21 ENCOUNTER — Ambulatory Visit (INDEPENDENT_AMBULATORY_CARE_PROVIDER_SITE_OTHER): Payer: BLUE CROSS/BLUE SHIELD

## 2016-07-21 ENCOUNTER — Telehealth: Payer: Self-pay | Admitting: *Deleted

## 2016-07-21 VITALS — BP 120/80 | HR 90 | Temp 98.1°F | Resp 16 | Ht 62.75 in | Wt 280.4 lb

## 2016-07-21 DIAGNOSIS — I10 Essential (primary) hypertension: Secondary | ICD-10-CM

## 2016-07-21 DIAGNOSIS — R079 Chest pain, unspecified: Secondary | ICD-10-CM

## 2016-07-21 DIAGNOSIS — R030 Elevated blood-pressure reading, without diagnosis of hypertension: Secondary | ICD-10-CM | POA: Diagnosis not present

## 2016-07-21 DIAGNOSIS — R0789 Other chest pain: Secondary | ICD-10-CM

## 2016-07-21 MED ORDER — NAPROXEN SODIUM 550 MG PO TABS: 550.0000 mg | ORAL_TABLET | Freq: Two times a day (BID) | ORAL | 1 refills | Status: DC

## 2016-07-21 NOTE — Progress Notes (Signed)
MRN: [id number] DOB: 06/26/1970  Subjective:   Mindy Collins is a 46 y.o. female presenting for chief complaint of chest pain (last night, pain and pressure)  Reports 1 day history of left sided chest pain. Patient had severe chest mid-sternal chest pain while reaching into the refrigerator last night that lasted ~30 minutes. Patient admits being sweaty before the chest pain started. Pain resolved with rest, she was able to eat her meal (had a salad). Also reports having had intermittent left arm pain, neck and jaw pain in the week prior to this chest pain, fatigue. During the episode of chest pain last night denies any diaphoresis, shob, heart racing, n/v, abdominal pain, radiation of pain into her neck, jaw or arm. Patient is on Mirena IUD. Denies smoking cigarettes.  [name] has a current medication list which includes the following prescription(s): cyclobenzaprine, fluticasone, levonorgestrel, loratadine, meloxicam, multivitamin, olmesartan-hydrochlorothiazide, butalbital-acetaminophen-caffeine, and levocetirizine. Also is allergic to esomeprazole magnesium; adhesive [tape]; latex; levocetirizine dihydrochloride; and nexium [esomeprazole]. [name]  has a past medical history of Anemia; Carpal tunnel syndrome, bilateral; Colitis; Dysmenorrhea; Fatty liver; GERD (gastroesophageal reflux disease); HPV (human papilloma virus) infection; Hypertension; Obesity; Obesity; and Seasonal allergies. Also  has a past surgical history that includes Gynecologic cryosurgery (1993); Myomectomy abdominal approach (04/31/2007); Myomectomy (02/17/2011); and Ovarian cyst removal (02/17/2011).  Objective:   Vitals: BP (!) 150/100   Pulse 90   Temp 98.1 F (36.7 C) (Oral)   Resp 16   Ht 5' 2.75" (1.594 m)   Wt 280 lb 6.4 oz (127.2 kg)   SpO2 96%   BMI 50.07 kg/m   BP Readings from Last 3 Encounters:  07/21/16 120/80  07/03/16 140/87  03/14/16 126/82   Physical Exam  Constitutional: She is oriented to person,  place, and time. She appears well-developed and well-nourished.  HENT:  Mouth/Throat: Oropharynx is clear and moist.  Eyes: No scleral icterus.  Neck: No JVD present.  Cardiovascular: Normal rate, regular rhythm and intact distal pulses.  Exam reveals no gallop and no friction rub.   No murmur heard. Pulmonary/Chest: No respiratory distress. She has no wheezes. She has no rales.  Abdominal: Soft. Bowel sounds are normal. She exhibits no distension and no mass. There is no tenderness. There is no guarding.  Neurological: She is alert and oriented to person, place, and time.  Skin: Skin is warm and dry.   Dg Chest 2 View  Result Date: 07/21/2016 CLINICAL DATA:  Chest pain. History of hypertension, thrombocytosis common nonsmoker. EXAM: CHEST  2 VIEW COMPARISON:  Chest x-ray of October 31, 2014 FINDINGS: The lungs are adequately inflated and clear. The heart and pulmonary vascularity are normal. The mediastinum is normal in width. There is no pleural effusion. The bony thorax exhibits no acute abnormality. IMPRESSION: There is no pneumonia, CHF, nor other acute cardiopulmonary abnormality. Electronically Signed   By: David  Martinique M.D.   On: 07/21/2016 11:15   ECG reading - Left axis deviation and possible q-wave in lead III which are both unchanged from previous ecg 10/31/2014. Otherwise sinus rhythm at 86bpm.  Assessment and Plan :   1. Atypical chest pain 2. Essential hypertension 3. Elevated blood pressure reading 4. Morbid obesity ([place]) - Labs pending, patient needs urgent referral to cardiology for stress test and evaluation of atypical chest pain. I discussed this with patient and she in agreement. In the meantime, patient will refrain from strenuous activity and start 81mg  aspirin daily.  [name], PA-C [facility] at [facility] [phone] 07/21/2016  10:32 AM

## 2016-07-21 NOTE — Telephone Encounter (Signed)
Per [place], [place], pharmacist advised to cancel Rx for Anaprox.

## 2016-07-21 NOTE — Patient Instructions (Addendum)
Please hold off on any strenuous activity until evaluated by a cardiologist. Start taking 81mg  baby aspirin daily. Continue taking your blood pressure medications as prescribed.    Nonspecific Chest Pain Chest pain can be caused by many different conditions. There is always a chance that your pain could be related to something serious, such as a heart attack or a blood clot in your lungs. Chest pain can also be caused by conditions that are not life-threatening. If you have chest pain, it is very important to follow up with your health care provider. What are the causes? Causes of this condition include:  Heartburn.  Pneumonia or bronchitis.  Anxiety or stress.  Inflammation around your heart (pericarditis) or lung (pleuritis or pleurisy).  A blood clot in your lung.  A collapsed lung (pneumothorax). This can develop suddenly on its own (spontaneous pneumothorax) or from trauma to the chest.  Shingles infection (varicella-zoster virus).  Heart attack.  Damage to the bones, muscles, and cartilage that make up your chest wall. This can include: ? Bruised bones due to injury. ? Strained muscles or cartilage due to frequent or repeated coughing or overwork. ? Fracture to one or more ribs. ? Sore cartilage due to inflammation (costochondritis).  What increases the risk? Risk factors for this condition may include:  Activities that increase your risk for trauma or injury to your chest.  Respiratory infections or conditions that cause frequent coughing.  Medical conditions or overeating that can cause heartburn.  Heart disease or family history of heart disease.  Conditions or health behaviors that increase your risk of developing a blood clot.  Having had chicken pox (varicella zoster).  What are the signs or symptoms? Chest pain can feel like:  Burning or tingling on the surface of your chest or deep in your chest.  Crushing, pressure, aching, or squeezing pain.  Dull  or sharp pain that is worse when you move, cough, or take a deep breath.  Pain that is also felt in your back, neck, shoulder, or arm, or pain that spreads to any of these areas.  Your chest pain may come and go, or it may stay constant. How is this diagnosed? Lab tests or other studies may be needed to find the cause of your pain. Your health care provider may have you take a test called an ECG (electrocardiogram). An ECG records your heartbeat patterns at the time the test is performed. You may also have other tests, such as:  Transthoracic echocardiogram (TTE). In this test, sound waves are used to create a picture of the heart structures and to look at how blood flows through your heart.  Transesophageal echocardiogram (TEE).This is a more advanced imaging test that takes images from inside your body. It allows your health care provider to see your heart in finer detail.  Cardiac monitoring. This allows your health care provider to monitor your heart rate and rhythm in real time.  Holter monitor. This is a portable device that records your heartbeat and can help to diagnose abnormal heartbeats. It allows your health care provider to track your heart activity for several days, if needed.  Stress tests. These can be done through exercise or by taking medicine that makes your heart beat more quickly.  Blood tests.  Other imaging tests.  How is this treated? Treatment depends on what is causing your chest pain. Treatment may include:  Medicines. These may include: ? Acid blockers for heartburn. ? Anti-inflammatory medicine. ? Pain medicine for inflammatory  conditions. ? Antibiotic medicine, if an infection is present. ? Medicines to dissolve blood clots. ? Medicines to treat coronary artery disease (CAD).  Supportive care for conditions that do not require medicines. This may include: ? Resting. ? Applying heat or cold packs to injured areas. ? Limiting activities until pain  decreases.  Follow these instructions at home: Medicines  If you were prescribed an antibiotic, take it as told by your health care provider. Do not stop taking the antibiotic even if you start to feel better.  Take over-the-counter and prescription medicines only as told by your health care provider. Lifestyle  Do not use any products that contain nicotine or tobacco, such as cigarettes and e-cigarettes. If you need help quitting, ask your health care provider.  Do not drink alcohol.  Make lifestyle changes as directed by your health care provider. These may include: ? Getting regular exercise. Ask your health care provider to suggest some activities that are safe for you. ? Eating a heart-healthy diet. A registered dietitian can help you to learn healthy eating options. ? Maintaining a healthy weight. ? Managing diabetes, if necessary. ? Reducing stress, such as with yoga or relaxation techniques. General instructions  Avoid any activities that bring on chest pain.  If heartburn is the cause for your chest pain, raise (elevate) the head of your bed about 6 inches (15 cm) by putting blocks under the legs. Sleeping with more pillows does not effectively relieve heartburn because it only changes the position of your head.  Keep all follow-up visits as told by your health care provider. This is important. This includes any further testing if your chest pain does not go away. Contact a health care provider if:  Your chest pain does not go away.  You have a rash with blisters on your chest.  You have a fever.  You have chills. Get help right away if:  Your chest pain is worse.  You have a cough that gets worse, or you cough up blood.  You have severe pain in your abdomen.  You have severe weakness.  You faint.  You have sudden, unexplained chest discomfort.  You have sudden, unexplained discomfort in your arms, back, neck, or jaw.  You have shortness of breath at any  time.  You suddenly start to sweat, or your skin gets clammy.  You feel nauseous or you vomit.  You suddenly feel light-headed or dizzy.  Your heart begins to beat quickly, or it feels like it is skipping beats. These symptoms may represent a serious problem that is an emergency. Do not wait to see if the symptoms will go away. Get medical help right away. Call your local emergency services (911 in the [place] Do not drive yourself to the hospital. This information is not intended to replace advice given to you by your health care provider. Make sure you discuss any questions you have with your health care provider. Document Released: 11/06/2004 Document Revised: 10/22/2015 Document Reviewed: 10/22/2015 Elsevier Interactive Patient Education  2017 [employer].     IF you received an x-ray today, you will receive an invoice from [facility]. Please contact [facility] at [phone] with questions or concerns regarding your invoice.   IF you received labwork today, you will receive an invoice from [place]. Please contact [facility] at [phone] with questions or concerns regarding your invoice.   Our billing staff will not be able to assist you with questions regarding bills from these companies.  You will  be contacted with the lab results as soon as they are available. The fastest way to get your results is to activate your My Chart account. Instructions are located on the last page of this paperwork. If you have not heard from [place] regarding the results in 2 weeks, please contact this office.

## 2016-07-22 LAB — CBC
Hematocrit: 42.2 % (ref 34.0–46.6)
Hemoglobin: 13.5 g/dL (ref 11.1–15.9)
MCH: 26.9 pg (ref 26.6–33.0)
MCHC: 32 g/dL (ref 31.5–35.7)
MCV: 84 fL (ref 79–97)
Platelets: 385 10*3/uL — ABNORMAL HIGH (ref 150–379)
RBC: 5.02 x10E6/uL (ref 3.77–5.28)
RDW: 15.2 % (ref 12.3–15.4)
WBC: 10.7 10*3/uL (ref 3.4–10.8)

## 2016-07-22 LAB — SEDIMENTATION RATE: Sed Rate: 14 mm/hr (ref 0–32)

## 2016-07-22 LAB — BRAIN NATRIURETIC PEPTIDE: BNP: 4.4 pg/mL (ref 0.0–100.0)

## 2016-07-28 ENCOUNTER — Encounter: Payer: Self-pay | Admitting: Family Medicine

## 2016-07-28 ENCOUNTER — Ambulatory Visit (INDEPENDENT_AMBULATORY_CARE_PROVIDER_SITE_OTHER): Payer: BLUE CROSS/BLUE SHIELD | Admitting: Family Medicine

## 2016-07-28 VITALS — BP 144/90 | HR 96 | Temp 98.1°F | Resp 18 | Ht 62.75 in | Wt 281.0 lb

## 2016-07-28 DIAGNOSIS — I10 Essential (primary) hypertension: Secondary | ICD-10-CM

## 2016-07-28 DIAGNOSIS — R0789 Other chest pain: Secondary | ICD-10-CM

## 2016-07-28 MED ORDER — RABEPRAZOLE SODIUM 20 MG PO TBEC: 20.0000 mg | DELAYED_RELEASE_TABLET | Freq: Every day | ORAL | 1 refills | Status: DC

## 2016-07-28 MED ORDER — ASPIRIN EC 81 MG PO TBEC: 81.0000 mg | DELAYED_RELEASE_TABLET | Freq: Every day | ORAL | Status: DC

## 2016-07-28 NOTE — Patient Instructions (Addendum)
Change the baby aspirin to an enteric-coated (ECG) aspirin 81 mg. Please swing by our same day appointment clinic at [address] on your way out. they'll have several samples of these at the front desk for you to start on. Keep the baby aspirin that your currently on at home. If any of these pains ever recur, then to 4 of those immediately.  Let see if [place] cardiovascular continue in sooner.  Start AcipHex 30 minutes before meal for the next 1-2 months. Then stop.  Let see back in 3 months for weight check with fasting labs.    IF you received an x-ray today, you will receive an invoice from [facility]. Please contact [facility] at [phone] with questions or concerns regarding your invoice.   IF you received labwork today, you will receive an invoice from [place]. Please contact [facility] at [phone] with questions or concerns regarding your invoice.   Our billing staff will not be able to assist you with questions regarding bills from these companies.  You will be contacted with the lab results as soon as they are available. The fastest way to get your results is to activate your My Chart account. Instructions are located on the last page of this paperwork. If you have not heard from [place] regarding the results in 2 weeks, please contact this office.      Angina Pectoris Angina pectoris, often called angina, is extreme discomfort in the chest, neck, or arm. This is caused by a lack of blood in the middle and thickest layer of the heart wall (myocardium). There are four types of angina:  Stable angina. Stable angina usually occurs in episodes of predictable frequency and duration. It is usually brought on by physical activity, stress, or excitement. Stable angina usually lasts a few minutes and can often be relieved by a medicine that you place under your tongue. This medicine is called sublingual nitroglycerin.  Unstable angina. Unstable angina can occur  even when you are doing little or no physical activity. It can even occur while you are sleeping or when you are at rest. It can suddenly increase in severity or frequency. It may not be relieved by sublingual nitroglycerin, and it can last up to 30 minutes.  Microvascular angina. This type of angina is caused by a disorder of tiny blood vessels called arterioles. Microvascular angina is more common in women. The pain may be more severe and last longer than other types of angina pectoris.  Prinzmetal or variant angina. This type of angina pectoris is rare and usually occurs when you are doing little or no physical activity. It especially occurs in the early morning hours.  What are the causes? Atherosclerosis is the cause of angina. This is the buildup of fat and cholesterol (plaque) on the inside of the arteries. Over time, the plaque may narrow or block the artery, and this will lessen blood flow to the heart. Plaque can also become weak and break off within a coronary artery to form a clot and cause a sudden blockage. What increases the risk? Risk factors common to both men and women include:  High cholesterol levels.  High blood pressure (hypertension).  Tobacco use.  Diabetes.  Family history of angina.  Obesity.  Lack of exercise.  A diet high in saturated fats.  Women are at greater risk for angina if they are:  Over age 69.  Postmenopausal.  What are the signs or symptoms? Many people do not experience any symptoms  during the early stages of angina. As the condition progresses, symptoms common to both men and women may include:  Chest pain. ? The pain can be described as a crushing or squeezing in the chest, or a tightness, pressure, fullness, or heaviness in the chest. ? The pain can last more than a few minutes, or it can stop and recur.  Pain in the arms, neck, jaw, or back.  Unexplained heartburn or indigestion.  Shortness of breath.  Nausea.  Sudden cold  sweats.  Sudden light-headedness.  Many women have chest discomfort and some of the other symptoms. However, women often have different (atypical) symptoms, such as:  Fatigue.  Unexplained feelings of nervousness or anxiety.  Unexplained weakness.  Dizziness or fainting.  Sometimes, women may have angina without any symptoms. How is this diagnosed? Tests to diagnose angina may include:  ECG (electrocardiogram).  Exercise stress test. This looks for signs of blockage when the heart is being exercised.  Pharmacologic stress test. This test looks for signs of blockage when the heart is being stressed with a medicine.  Blood tests.  Coronary angiogram. This is a procedure to look at the coronary arteries to see if there is any blockage.  How is this treated? The treatment of angina may include the following:  Healthy behavioral changes to reduce or control risk factors.  Medicine.  Coronary stenting.A stent helps to keep an artery open.  Coronary angioplasty. This procedure widens a narrowed or blocked artery.  Coronary arterybypass surgery. This will allow your blood to pass the blockage (bypass) to reach your heart.  Follow these instructions at home:  Take medicines only as directed by your health care provider.  Do not take the following medicines unless your health care provider approves: ? Nonsteroidal anti-inflammatory drugs (NSAIDs), such as ibuprofen, naproxen, or celecoxib. ? Vitamin supplements that contain vitamin A, vitamin E, or both. ? Hormone replacement therapy that contains estrogen with or without progestin.  Manage other health conditions such as hypertension and diabetes as directed by your health care provider.  Follow a heart-healthy diet. A dietitian can help to educate you about healthy food options and changes.  Use healthy cooking methods such as roasting, grilling, broiling, baking, poaching, steaming, or stir-frying. Talk to a dietitian  to learn more about healthy cooking methods.  Follow an exercise program approved by your health care provider.  Maintain a healthy weight. Lose weight as approved by your health care provider.  Plan rest periods when fatigued.  Learn to manage stress.  Do not use any tobacco products, including cigarettes, chewing tobacco, or electronic cigarettes. If you need help quitting, ask your health care provider.  If you drink alcohol, and your health care provider approves, limit your alcohol intake to no more than 1 drink per day. One drink equals 12 ounces of beer, 5 ounces of wine, or 1 ounces of hard liquor.  Stop illegal drug use.  Keep all follow-up visits as directed by your health care provider. This is important. Get help right away if:  You have pain in your chest, neck, arm, jaw, stomach, or back that lasts more than a few minutes, is recurring, or is unrelieved by taking sublingualnitroglycerin.  You have profuse sweating without cause.  You have unexplained: ? Heartburn or indigestion. ? Shortness of breath or difficulty breathing. ? Nausea or vomiting. ? Fatigue. ? Feelings of nervousness or anxiety. ? Weakness. ? Diarrhea.  You have sudden light-headedness or dizziness.  You faint. These symptoms  may represent a serious problem that is an emergency. Do not wait to see if the symptoms will go away. Get medical help right away. Call your local emergency services (911 in the [place] Do not drive yourself to the hospital. This information is not intended to replace advice given to you by your health care provider. Make sure you discuss any questions you have with your health care provider. Document Released: 01/27/2005 Document Revised: 07/11/2015 Document Reviewed: 05/31/2013 Elsevier Interactive Patient Education  2017 [employer].

## 2016-07-28 NOTE — Progress Notes (Signed)
Subjective:    Patient ID: Mindy Collins, female    DOB: Jun 19, 1970, 46 y.o.   MRN: [id number] Chief Complaint  Patient presents with  . Chest Pain  . Follow-up    HPI  [name] was seen 1 wk prior by [facility] after experiencing, the evening prior to eval, a self-limited 30 minute episode of severe sudden-onset midsternal chest pain accompanied by diaphoresis which resolved after rest.  She had had intermittent left arm neck and jaw tightening/pains over the prior week (also not exertional) in addition to fatigue though did not have any associated alarm symptoms during the 30 minute episode of chest pain.  She did have a feeling of doom but did not think about calling 911 though she did prepare to have someone take her to the ER in case it did not resolve soon.  At the office visit patient's blood pressure was 150 over 100 which is significantly higher than her baseline 120/80. Exam, chest x-ray were normal and EKG was unchanged compared to September 2016. Patient was started on aspirin 81 mg and advised to avoid strenuous activity while urgent cardiology referral is pending. CBC, ESR, BNP were normal.  Going to [place] for a week.  Saw Dr. Harrington Challenger at [facility] in 2012 when she had a nml stress echo 04/23/2010 which is scanned into the procedures tab in Epic.   She has not changed her activity this week.  Prior to that she had joined the [facility] and was getting ready to try it and water aerobics but holding off until she gets cardiology clearance. BP at home has been 120s/80s still this week.  Did start asa 71m and noted it was causing gerd sxs.  She did notice that she can taste the baby aspirin all day. No LRD type sxs.  Has a sedentary job but has been doing better with her diet. Oatmeal for breakfast, tuna and salad for lunch, veggies for dinner.  Has been having to deny herself desserts. Trying to chew food more and eat slower. Stopped vitamin as occ vitamin feels like it is getting stuck  in chest occ. No GERD/gastritis sxs.  Has had several times where reflux woke her up at night over the past year 3x in the past year.  Chronic constipation so needs to restart miralax. Has had some sinus sxs over the past few wks w/ itchy eyes so restarted flonase.  Past Medical History:  Diagnosis Date  . Anemia   . Carpal tunnel syndrome, bilateral   . Colitis   . Dysmenorrhea   . Fatty liver   . GERD (gastroesophageal reflux disease)   . HPV (human papilloma virus) infection    WARTS/ TCA [place]  . Hypertension   . Obesity   . Obesity   . Seasonal allergies    Past Surgical History:  Procedure Laterality Date  . GYNECOLOGIC CRYOSURGERY  1993   DYSPLASIA CERVIX/   . MYOMECTOMY  02/17/2011   Procedure: MYOMECTOMY;  Surgeon: JTerrance Mass MD;  [facility];  Service: Gynecology;  Laterality: N/A;  I am hoping to get a 7:30am time on Dec 4, Dec 11 or Dec 12. It not available I will check on some 1:00 pm times. Thanks  . MYOMECTOMY ABDOMINAL APPROACH  04/31/2007  . OVARIAN CYST REMOVAL  02/17/2011   Procedure: OVARIAN CYSTECTOMY;  Surgeon: JTerrance Mass MD;  [facility];  Service: Gynecology;  Laterality: Left;   Current Outpatient Prescriptions on File Prior  to Visit  Medication Sig Dispense Refill  . cyclobenzaprine (AMRIX) 30 MG 24 hr capsule Take 1 capsule (30 mg total) by mouth daily as needed for muscle spasms. 30 capsule 0  . fluticasone (FLONASE) 50 MCG/ACT nasal spray instill 1 spray into each nostril twice a day  0  . levonorgestrel (MIRENA) 20 MCG/24HR IUD 1 each by Intrauterine route once.    . loratadine (CLARITIN) 10 MG tablet Take 10 mg by mouth daily.    . Multiple Vitamin (MULTIVITAMIN) tablet Take 1 tablet by mouth daily.    Marland Kitchen olmesartan-hydrochlorothiazide (BENICAR HCT) 20-12.5 MG tablet Take 1 tablet by mouth daily. 90 tablet 3   No current facility-administered medications on file prior to visit.    Allergies  Allergen Reactions  . Esomeprazole  Magnesium Shortness Of Breath    Nervousness; tolerates Aciphex.  . Adhesive [Tape] Itching and Rash    Itching and rash with adhesive tape  . Latex Itching and Dermatitis    Skin blistering  . Levocetirizine Dihydrochloride Other (See Comments)    Muscle jerks  . Nexium [Esomeprazole]    Family History  Problem Relation Age of Onset  . Hypertension Mother   . Breast cancer Mother   . Cancer Mother   . Heart disease Maternal Grandmother   . Cancer Father        LUNG  . Breast cancer Maternal Aunt   . Diabetes Maternal Grandfather   . Cancer Paternal Aunt   . Ovarian cancer Paternal Aunt    Social History   Social History  . Marital status: Single    Spouse name: N/A  . Number of children: 0  . Years of education: N/A   Occupational History  . graduate student Student    [employer]   Social History Main Topics  . Smoking status: Never Smoker  . Smokeless tobacco: Never Used     Comment: denies tobacco   . Alcohol use No  . Drug use: No  . Sexual activity: Yes    Birth control/ protection: IUD   Other Topics Concern  . None   Social History Narrative   Graduate degree from [employer] in woman/gender studies.   Lives with her mother.   Depression screen [facility] 2/9 07/28/2016 07/21/2016 07/03/2016 02/05/2016 12/21/2015  Decreased Interest 0 0 0 0 0  Down, Depressed, Hopeless 0 0 0 0 0  PHQ - 2 Score 0 0 0 0 0    Review of Systems See hpi    Objective:   Physical Exam  Constitutional: She is oriented to person, place, and time. She appears well-developed and well-nourished. No distress.  Morbid central obesity  HENT:  Head: Normocephalic and atraumatic.  Right Ear: External ear normal.  Left Ear: External ear normal.  Eyes: Conjunctivae are normal. No scleral icterus.  Neck: Normal range of motion. Neck supple. No thyromegaly present.  Cardiovascular: Normal rate, regular rhythm, normal heart sounds and intact distal pulses.   Pulmonary/Chest: Effort normal  and breath sounds normal. No respiratory distress.  Musculoskeletal: She exhibits no edema.  Lymphadenopathy:    She has no cervical adenopathy.  Neurological: She is alert and oriented to person, place, and time.  Skin: Skin is warm and dry. She is not diaphoretic. No erythema.  Psychiatric: She has a normal mood and affect. Her behavior is normal.         BP 139/83   Pulse 96   Temp 98.1 F (36.7 C) (Oral)   Resp 18  Ht 5' 2.75" (1.594 m)   Wt 281 lb (127.5 kg)   SpO2 98%   BMI 50.17 kg/m   Assessment & Plan:   1. Atypical chest pain   2. Essential hypertension   Has appt with Dr. Frederico Hamman on 6/22 - will see if [facility] can get pt in prior due to her leaving on 6/6.   Orders Placed This Encounter  Procedures  . Care order/instruction:    Scheduling Instructions:     Recheck BP  . Care order/instruction:    AVS printed - let patient go!    Meds ordered this encounter  Medications  . RABEprazole (ACIPHEX) 20 MG tablet    Sig: Take 1 tablet (20 mg total) by mouth daily.    Dispense:  30 tablet    Refill:  1  . aspirin EC 81 MG tablet    Sig: Take 1 tablet (81 mg total) by mouth daily.     [name], M.D.  Primary Care at [facility] [address] [place], [place] [address] [phone] phone [phone] fax  07/29/16 10:28 AM

## 2016-07-29 ENCOUNTER — Encounter: Payer: Self-pay | Admitting: Family Medicine

## 2016-08-21 ENCOUNTER — Ambulatory Visit: Payer: BLUE CROSS/BLUE SHIELD | Admitting: Urgent Care

## 2016-09-19 ENCOUNTER — Other Ambulatory Visit: Payer: Self-pay | Admitting: Family Medicine

## 2016-09-27 ENCOUNTER — Ambulatory Visit (INDEPENDENT_AMBULATORY_CARE_PROVIDER_SITE_OTHER): Payer: BLUE CROSS/BLUE SHIELD | Admitting: Family Medicine

## 2016-09-27 ENCOUNTER — Encounter: Payer: Self-pay | Admitting: Family Medicine

## 2016-09-27 VITALS — BP 122/90 | HR 75 | Temp 98.3°F | Resp 16 | Ht 62.6 in | Wt 284.0 lb

## 2016-09-27 DIAGNOSIS — R7303 Prediabetes: Secondary | ICD-10-CM

## 2016-09-27 DIAGNOSIS — R740 Nonspecific elevation of levels of transaminase and lactic acid dehydrogenase [LDH]: Secondary | ICD-10-CM

## 2016-09-27 DIAGNOSIS — I10 Essential (primary) hypertension: Secondary | ICD-10-CM

## 2016-09-27 DIAGNOSIS — D75839 Thrombocytosis, unspecified: Secondary | ICD-10-CM

## 2016-09-27 DIAGNOSIS — D473 Essential (hemorrhagic) thrombocythemia: Secondary | ICD-10-CM

## 2016-09-27 DIAGNOSIS — M545 Low back pain, unspecified: Secondary | ICD-10-CM

## 2016-09-27 DIAGNOSIS — R7401 Elevation of levels of liver transaminase levels: Secondary | ICD-10-CM

## 2016-09-27 MED ORDER — MELOXICAM 15 MG PO TABS: 15.0000 mg | ORAL_TABLET | Freq: Every day | ORAL | 2 refills | Status: DC | PRN

## 2016-09-27 MED ORDER — CYCLOBENZAPRINE HCL ER 30 MG PO CP24: 30.0000 mg | ORAL_CAPSULE | Freq: Every day | ORAL | 2 refills | Status: DC | PRN

## 2016-09-27 MED ORDER — RANITIDINE HCL 150 MG PO TABS: 150.0000 mg | ORAL_TABLET | Freq: Two times a day (BID) | ORAL | 1 refills | Status: DC

## 2016-09-27 NOTE — Patient Instructions (Addendum)
IF you received an x-ray today, you will receive an invoice from [facility]. Please contact [facility] at [phone] with questions or concerns regarding your invoice.   IF you received labwork today, you will receive an invoice from [facility]. Please contact [facility] at [phone] with questions or concerns regarding your invoice.   Our billing staff will not be able to assist you with questions regarding bills from these companies.  You will be contacted with the lab results as soon as they are available. The fastest way to get your results is to activate your My Chart account. Instructions are located on the last page of this paperwork. If you have not heard from [place] regarding the results in 2 weeks, please contact this office.      Back Injury Prevention Back injuries can be very painful. They can also be difficult to heal. After having one back injury, you are more likely to injure your back again. It is important to learn how to avoid injuring or re-injuring your back. The following tips can help you to prevent a back injury. What should I know about physical fitness?  Exercise for 30 minutes per day on most days of the week or as directed by your health care provider. Make sure to: ? Do aerobic exercises, such as walking, jogging, biking, or swimming. ? Do exercises that increase balance and strength, such as tai chi and yoga. These can decrease your risk of falling and injuring your back. ? Do stretching exercises to help with flexibility. ? Try to develop strong abdominal muscles. Your abdominal muscles provide a lot of the support that is needed by your back.  Maintain a healthy weight. This helps to decrease your risk of a back injury. What should I know about my diet?  Talk with your health care provider about your overall diet. Take supplements and vitamins only as directed by your health care provider.  Talk with your health care provider about  how much calcium and vitamin D you need each day. These nutrients help to prevent weakening of the bones (osteoporosis). Osteoporosis can cause broken (fractured) bones, which lead to back pain.  Include good sources of calcium in your diet, such as dairy products, green leafy vegetables, and products that have had calcium added to them (fortified).  Include good sources of vitamin D in your diet, such as milk and foods that are fortified with vitamin D. What should I know about my posture?  Sit up straight and stand up straight. Avoid leaning forward when you sit or hunching over when you stand.  Choose chairs that have good low-back (lumbar) support.  If you work at a desk, sit close to it so you do not need to lean over. Keep your chin tucked in. Keep your neck drawn back, and keep your elbows bent at a right angle. Your arms should look like the letter "L."  Sit high and close to the steering wheel when you drive. Add a lumbar support to your car seat, if needed.  Avoid sitting or standing in one position for very long. Take breaks to get up, stretch, and walk around at least one time every hour. Take breaks every hour if you are driving for long periods of time.  Sleep on your side with your knees slightly bent, or sleep on your back with a pillow under your knees. Do not lie on the front of your body to sleep. What should I know about lifting,  twisting, and reaching? Lifting and Heavy Lifting   Avoid heavy lifting, especially repetitive heavy lifting. If you must do heavy lifting: ? Stretch before lifting. ? Work slowly. ? Rest between lifts. ? Use a tool such as a cart or a dolly to move objects if one is available. ? Make several small trips instead of carrying one heavy load. ? Ask for help when you need it, especially when moving big objects.  Follow these steps when lifting: ? Stand with your feet shoulder-width apart. ? Get as close to the object as you can. Do not try to  pick up a heavy object that is far from your body. ? Use handles or lifting straps if they are available. ? Bend at your knees. Squat down, but keep your heels off the floor. ? Keep your shoulders pulled back, your chin tucked in, and your back straight. ? Lift the object slowly while you tighten the muscles in your legs, abdomen, and buttocks. Keep the object as close to the center of your body as possible.  Follow these steps when putting down a heavy load: ? Stand with your feet shoulder-width apart. ? Lower the object slowly while you tighten the muscles in your legs, abdomen, and buttocks. Keep the object as close to the center of your body as possible. ? Keep your shoulders pulled back, your chin tucked in, and your back straight. ? Bend at your knees. Squat down, but keep your heels off the floor. ? Use handles or lifting straps if they are available. Twisting and Reaching  Avoid lifting heavy objects above your waist.  Do not twist at your waist while you are lifting or carrying a load. If you need to turn, move your feet.  Do not bend over without bending at your knees.  Avoid reaching over your head, across a table, or for an object on a high surface. What are some other tips?  Avoid wet floors and icy ground. Keep sidewalks clear of ice to prevent falls.  Do not sleep on a mattress that is too soft or too hard.  Keep items that are used frequently within easy reach.  Put heavier objects on shelves at waist level, and put lighter objects on lower or higher shelves.  Find ways to decrease your stress, such as exercise, massage, or relaxation techniques. Stress can build up in your muscles. Tense muscles are more vulnerable to injury.  Talk with your health care provider if you feel anxious or depressed. These conditions can make back pain worse.  Wear flat heel shoes with cushioned soles.  Avoid sudden movements.  Use both shoulder straps when carrying a  backpack.  Do not use any tobacco products, including cigarettes, chewing tobacco, or electronic cigarettes. If you need help quitting, ask your health care provider. This information is not intended to replace advice given to you by your health care provider. Make sure you discuss any questions you have with your health care provider. Document Released: 03/06/2004 Document Revised: 07/05/2015 Document Reviewed: 01/31/2014 Elsevier Interactive Patient Education  2017 [employer].

## 2016-09-27 NOTE — Progress Notes (Addendum)
By signing my name below, I, Mindy Collins, attest that this documentation has been prepared under the direction and in the presence of Mindy Cheadle, MD.  Electronically Signed: Verlee Collins, Medical Scribe. 09/27/16. 10:46 AM.  Subjective:    Patient ID: Mindy Collins, female    DOB: 1970/07/27, 46 y.o.   MRN: [id number]  HPI Chief Complaint  Patient presents with  . Hypertension    follow-up    HPI Comments: Mindy Collins is a 46 y.o. female who presents to Primary Care at [facility] for HTN follow-up. Last saw her 2 months ago. She had atypical chest pain which was likely attributed to GERD, but due to risk of sedentary lifestyle and HTN, was referred to cardiology Dr. Wynonia Lawman to rule out - no results avaliable. Started on daily asipirin 81 and aciphex. Pt is fasting.  HTN: Pt is complaint with her medication but missed a dose this morning. Reports her systolic bp has been fine, running 120-128. Pt's insurance company, [employer], would like her to have step therapy. She has been on valsartan and lisinopril in the past.  Chest Pain f/u: Pt was cleared by Dr. Wynonia Lawman to exercise with a stress test. Pt was told her heart is fine and does what it's supposed to do when she exercises. Pt is compliant with aciphex and she's mainly been chest pain free, but 1x she had chest pain a couple of days after eating fried food. She typically doesn't eat fried food. Reports nexium makes her nervous.  Back Pain: Pt injured her back after trying to move [place] luggage in the airport. She took meloxicam for relief of her sxs.  Exercise: Mindy Collins (through HTN at the [facility]) is the instructer for a prep program. The first half is a class and pt gets knowledge about a healthy lifestyle and the second half she'll be in the gym.   Patient Active Problem List   Diagnosis Date Noted  . Abdominal wall seroma ([place]) 03/14/2016  . Adnexal mass 03/03/2016  . Thrombocytosis ([name]) 07/31/2014  . IUD (intrauterine  device) in place 07/31/2014  . DUB (dysfunctional uterine bleeding) 07/27/2014  . Intramural leiomyoma of uterus 06/30/2014  . Hydrosalpinx 06/30/2014  . Back pain 05/30/2014  . Morbid obesity ([name]) 08/21/2013  . HYPERTENSION, BENIGN 04/05/2010   Past Medical History:  Diagnosis Date  . Anemia   . Carpal tunnel syndrome, bilateral   . Colitis   . Dysmenorrhea   . Fatty liver   . GERD (gastroesophageal reflux disease)   . HPV (human papilloma virus) infection    WARTS/ TCA [place]  . Hypertension   . Obesity   . Obesity   . Seasonal allergies    Past Surgical History:  Procedure Laterality Date  . GYNECOLOGIC CRYOSURGERY  1993   DYSPLASIA CERVIX/   . MYOMECTOMY  02/17/2011   Procedure: MYOMECTOMY;  Surgeon: Terrance Mass, MD;  Location: [facility];  Service: Gynecology;  Laterality: N/A;  I am hoping to get a 7:30am time on Dec 4, Dec 11 or Dec 12. It not available I will check on some 1:00 pm times. Thanks  . MYOMECTOMY ABDOMINAL APPROACH  04/31/2007  . OVARIAN CYST REMOVAL  02/17/2011   Procedure: OVARIAN CYSTECTOMY;  Surgeon: Terrance Mass, MD;  Location: [facility];  Service: Gynecology;  Laterality: Left;   Allergies  Allergen Reactions  . Esomeprazole Magnesium Shortness Of Breath    Nervousness; tolerates Aciphex.  . Adhesive [Tape] Itching and Rash  Itching and rash with adhesive tape  . Latex Itching and Dermatitis    Skin blistering  . Levocetirizine Dihydrochloride Other (See Comments)    Muscle jerks  . Nexium [Esomeprazole]    Prior to Admission medications   Medication Sig Start Date End Date Taking? Authorizing Provider  cyclobenzaprine (AMRIX) 30 MG 24 hr capsule Take 1 capsule (30 mg total) by mouth daily as needed for muscle spasms. 09/03/15  Yes Shawnee Knapp, MD  fluticasone Asencion Islam) 50 MCG/ACT nasal spray instill 1 spray into each nostril twice a day 01/17/16  Yes [provider]  levonorgestrel (MIRENA) 20 MCG/24HR IUD 1 each by Intrauterine route  once.   Yes [provider]  loratadine (CLARITIN) 10 MG tablet Take 10 mg by mouth daily.   Yes [provider]  Multiple Vitamin (MULTIVITAMIN) tablet Take 1 tablet by mouth daily.   Yes [provider]  olmesartan-hydrochlorothiazide (BENICAR HCT) 20-12.5 MG tablet Take 1 tablet by mouth daily. 07/03/16  Yes Shawnee Knapp, MD  RABEprazole (ACIPHEX) 20 MG tablet Take 1 tablet (20 mg total) by mouth daily. 07/28/16  Yes Shawnee Knapp, MD   Social History   Social History  . Marital status: Single    Spouse name: N/A  . Number of children: 0  . Years of education: N/A   Occupational History  . graduate student Student    [employer]   Social History Main Topics  . Smoking status: Never Smoker  . Smokeless tobacco: Never Used     Comment: denies tobacco   . Alcohol use No  . Drug use: No  . Sexual activity: Yes    Birth control/ protection: IUD   Other Topics Concern  . Not on file   Social History Narrative   Graduate degree from [employer] in woman/gender studies.   Lives with her mother.   Family History  Problem Relation Age of Onset  . Hypertension Mother   . Breast cancer Mother   . Cancer Mother   . Heart disease Maternal Grandmother   . Cancer Father        LUNG  . Breast cancer Maternal Aunt   . Diabetes Maternal Grandfather   . Cancer Paternal Aunt   . Ovarian cancer Paternal Aunt     Depression screen [facility] 2/9 09/27/2016 07/28/2016 07/21/2016 07/03/2016 02/05/2016  Decreased Interest 0 0 0 0 0  Down, Depressed, Hopeless 0 0 0 0 0  PHQ - 2 Score 0 0 0 0 0    Review of Systems  Constitutional: Negative for diaphoresis and unexpected weight change.  Respiratory: Negative for shortness of breath.   Cardiovascular: Positive for chest pain.  Musculoskeletal: Positive for back pain. Negative for gait problem.  Neurological: Negative for weakness.   Objective:  Physical Exam  Constitutional: She appears well-developed and well-nourished.  No distress.  HENT:  Head: Normocephalic and atraumatic.  Eyes: Conjunctivae are normal.  Neck: Neck supple.  Cardiovascular: Normal rate, regular rhythm and normal heart sounds.  Exam reveals no gallop and no friction rub.   No murmur heard. Pulmonary/Chest: Effort normal and breath sounds normal. No respiratory distress. She has no wheezes. She has no rales.  Neurological: She is alert.  Skin: Skin is warm and dry.  Psychiatric: She has a normal mood and affect. Her behavior is normal.  Nursing note and vitals reviewed.   Vitals:   09/27/16 1022 09/27/16 1114  BP: (!) 146/87 122/90  Pulse: 75   Resp: 16  Temp: 98.3 F (36.8 C)   TempSrc: Oral   SpO2: 96%   Weight: 284 lb (128.8 kg)   Height: 5' 2.6" (1.59 m)    Body mass index is 50.96 kg/m. Assessment & Plan:    1. HYPERTENSION, BENIGN - She has been on valsartan and lisinopril. Needs PA for benicar.  2. Thrombocytosis ([place])   3. Morbid obesity ([place]) - going to join the [employer] program (provider referral for exercise program) at the [facility] referral placed - 12 wks of nutrition, exericise, lifestyle coaching. Will refer to Dr. Acquanetta Belling for medical weight loss treatment as well.  4. Transaminitis  - slowly worsening - likely fatty liver  5. Prediabetes  Lab Results  Component Value Date   HGBA1C 6.0 (H) 09/27/2016   HGBA1C 5.7 (H) 01/21/2014   HGBA1C 5.4 12/15/2013     6.      Acute bilateral low back pain without sciatica - after heavy lifting. Refilled when necessary cyclobenzaprine. Continue when necessary mobic  Orders Placed This Encounter  Procedures  . CBC with Differential/Platelet  . Comprehensive metabolic panel  . Hemoglobin A1c  . Hemoglobin A1c  . Care order/instruction:    Scheduling Instructions:     Recheck BP  . Care order/instruction:    Scheduling Instructions:     Complete orders, AVS and go.    Meds ordered this encounter  Medications  . ranitidine (ZANTAC) 150 MG tablet      Sig: Take 1 tablet (150 mg total) by mouth 2 (two) times daily.    Dispense:  180 tablet    Refill:  1  . cyclobenzaprine (AMRIX) 30 MG 24 hr capsule    Sig: Take 1 capsule (30 mg total) by mouth daily as needed for muscle spasms.    Dispense:  30 capsule    Refill:  2  . meloxicam (MOBIC) 15 MG tablet    Sig: Take 1 tablet (15 mg total) by mouth daily as needed for pain (back pain).    Dispense:  30 tablet    Refill:  2    I personally performed the services described in this documentation, which was scribed in my presence. The recorded information has been reviewed and considered, and addended by me as needed.   Mindy Collins, M.D.  Primary Care at [facility] [address] [place], [place] [address] [phone] phone [phone] fax  09/29/16 11:38 PM  Results for orders placed or performed in visit on 09/27/16  CBC with Differential/Platelet  Result Value Ref Range   WBC 10.3 3.4 - 10.8 x10E3/uL   RBC 4.72 3.77 - 5.28 x10E6/uL   Hemoglobin 12.5 11.1 - 15.9 g/dL   Hematocrit 39.0 34.0 - 46.6 %   MCV 83 79 - 97 fL   MCH 26.5 (L) 26.6 - 33.0 pg   MCHC 32.1 31.5 - 35.7 g/dL   RDW 14.8 12.3 - 15.4 %   Platelets 401 (H) 150 - 379 x10E3/uL   Neutrophils 69 Not Estab. %   Lymphs 22 Not Estab. %   Monocytes 8 Not Estab. %   Eos 1 Not Estab. %   Basos 0 Not Estab. %   Neutrophils Absolute 7.1 (H) 1.4 - 7.0 x10E3/uL   Lymphocytes Absolute 2.2 0.7 - 3.1 x10E3/uL   Monocytes Absolute 0.9 0.1 - 0.9 x10E3/uL   EOS (ABSOLUTE) 0.1 0.0 - 0.4 x10E3/uL   Basophils Absolute 0.0 0.0 - 0.2 x10E3/uL   Immature Granulocytes 0  Not Estab. %   Immature Grans (Abs) 0.0 0.0 - 0.1 x10E3/uL  Comprehensive metabolic panel  Result Value Ref Range   Glucose 100 (H) 65 - 99 mg/dL   BUN 10 6 - 24 mg/dL   Creatinine, Ser 0.76 0.57 - 1.00 mg/dL   GFR calc non Af Amer 96 >59 mL/min/1.73   GFR calc Af Amer 110 >59 mL/min/1.73   BUN/Creatinine Ratio 13 9 - 23   Sodium 143 134 - 144  mmol/L   Potassium 4.1 3.5 - 5.2 mmol/L   Chloride 100 96 - 106 mmol/L   CO2 30 (H) 20 - 29 mmol/L   Calcium 9.5 8.7 - 10.2 mg/dL   Total Protein 7.2 6.0 - 8.5 g/dL   Albumin 4.2 3.5 - 5.5 g/dL   Globulin, Total 3.0 1.5 - 4.5 g/dL   Albumin/Globulin Ratio 1.4 1.2 - 2.2   Bilirubin Total 0.6 0.0 - 1.2 mg/dL   Alkaline Phosphatase 123 (H) 39 - 117 IU/L   AST 41 (H) 0 - 40 IU/L   ALT 60 (H) 0 - 32 IU/L  Hemoglobin A1c  Result Value Ref Range   Hgb A1c MFr Bld 6.0 (H) 4.8 - 5.6 %   Est. average glucose Bld gHb Est-mCnc 126 mg/dL

## 2016-09-28 LAB — CBC WITH DIFFERENTIAL/PLATELET
Basophils Absolute: 0 10*3/uL (ref 0.0–0.2)
Basos: 0 %
EOS (ABSOLUTE): 0.1 10*3/uL (ref 0.0–0.4)
Eos: 1 %
Hematocrit: 39 % (ref 34.0–46.6)
Hemoglobin: 12.5 g/dL (ref 11.1–15.9)
Immature Grans (Abs): 0 10*3/uL (ref 0.0–0.1)
Immature Granulocytes: 0 %
Lymphocytes Absolute: 2.2 10*3/uL (ref 0.7–3.1)
Lymphs: 22 %
MCH: 26.5 pg — ABNORMAL LOW (ref 26.6–33.0)
MCHC: 32.1 g/dL (ref 31.5–35.7)
MCV: 83 fL (ref 79–97)
Monocytes Absolute: 0.9 10*3/uL (ref 0.1–0.9)
Monocytes: 8 %
Neutrophils Absolute: 7.1 10*3/uL — ABNORMAL HIGH (ref 1.4–7.0)
Neutrophils: 69 %
Platelets: 401 10*3/uL — ABNORMAL HIGH (ref 150–379)
RBC: 4.72 x10E6/uL (ref 3.77–5.28)
RDW: 14.8 % (ref 12.3–15.4)
WBC: 10.3 10*3/uL (ref 3.4–10.8)

## 2016-09-28 LAB — COMPREHENSIVE METABOLIC PANEL
ALT: 60 IU/L — ABNORMAL HIGH (ref 0–32)
AST: 41 IU/L — ABNORMAL HIGH (ref 0–40)
Albumin/Globulin Ratio: 1.4 (ref 1.2–2.2)
Albumin: 4.2 g/dL (ref 3.5–5.5)
Alkaline Phosphatase: 123 IU/L — ABNORMAL HIGH (ref 39–117)
BUN/Creatinine Ratio: 13 (ref 9–23)
BUN: 10 mg/dL (ref 6–24)
Bilirubin Total: 0.6 mg/dL (ref 0.0–1.2)
CO2: 30 mmol/L — ABNORMAL HIGH (ref 20–29)
Calcium: 9.5 mg/dL (ref 8.7–10.2)
Chloride: 100 mmol/L (ref 96–106)
Creatinine, Ser: 0.76 mg/dL (ref 0.57–1.00)
GFR calc Af Amer: 110 mL/min/{1.73_m2} (ref >59)
GFR calc non Af Amer: 96 mL/min/{1.73_m2} (ref >59)
Globulin, Total: 3 g/dL (ref 1.5–4.5)
Glucose: 100 mg/dL — ABNORMAL HIGH (ref 65–99)
Potassium: 4.1 mmol/L (ref 3.5–5.2)
Sodium: 143 mmol/L (ref 134–144)
Total Protein: 7.2 g/dL (ref 6.0–8.5)

## 2016-09-28 LAB — HEMOGLOBIN A1C
Est. average glucose Bld gHb Est-mCnc: 126 mg/dL
Hgb A1c MFr Bld: 6 % — ABNORMAL HIGH (ref 4.8–5.6)

## 2016-09-30 ENCOUNTER — Encounter: Payer: Self-pay | Admitting: Family Medicine

## 2016-09-30 ENCOUNTER — Other Ambulatory Visit: Payer: Self-pay | Admitting: Family Medicine

## 2016-10-01 ENCOUNTER — Telehealth: Payer: Self-pay

## 2016-10-01 ENCOUNTER — Encounter: Payer: Self-pay | Admitting: Family Medicine

## 2016-10-01 NOTE — Telephone Encounter (Signed)
Received request for PA for Benicar.  Completed on Cover My Meds.  Key Code is [id number].  Recheck status in 3 business days.

## 2016-10-02 NOTE — Telephone Encounter (Signed)
Dr. Brigitte Pulse, Received denial through [facility] today stating patient needs to try another formulary drug before they will approve the Benicar HCTZ. (They counted Valsartan but did not count the lisinopril.)  A list of recommended formulary is below: Candesartan/cilexetil-HCTZ Irebesartan-HCTZ Losartan potassium-HCTZ Olmesartan-HCTZ Please advise, Emmakate Hypes

## 2016-10-03 ENCOUNTER — Encounter: Payer: Self-pay | Admitting: Family Medicine

## 2016-10-03 DIAGNOSIS — R7303 Prediabetes: Secondary | ICD-10-CM | POA: Insufficient documentation

## 2016-10-03 DIAGNOSIS — E785 Hyperlipidemia, unspecified: Secondary | ICD-10-CM | POA: Insufficient documentation

## 2016-10-03 MED ORDER — OLMESARTAN MEDOXOMIL-HCTZ 20-12.5 MG PO TABS: 1.0000 | ORAL_TABLET | Freq: Every day | ORAL | 3 refills | Status: DC

## 2016-10-03 MED ORDER — CYCLOBENZAPRINE HCL 10 MG PO TABS: 10.0000 mg | ORAL_TABLET | Freq: Three times a day (TID) | ORAL | 2 refills | Status: DC | PRN

## 2016-10-03 NOTE — Telephone Encounter (Signed)
Olmesartan-hctz is the Benicar HCT - it was denied and it now says it is on their formulary?????  If it is on the list, why is it being denied? Are there only certain doses that are approved. Sent new rx to the pharm - maybe the last was going through as the brand name only  Somehow?   Please f/u w/ pt and see if she was able to get the olmesartan-hctz I just sent in. If not,  Will change her to irbesartan-hctz.

## 2016-10-03 NOTE — Addendum Note (Signed)
Addended by: Shawnee Knapp on: 10/03/2016 12:05 PM   Modules accepted: Orders

## 2016-10-04 ENCOUNTER — Ambulatory Visit: Payer: BLUE CROSS/BLUE SHIELD | Admitting: Family Medicine

## 2016-10-07 NOTE — Telephone Encounter (Signed)
LMVM of patient, inquiring if she had received her bp med yet.  If she has not, please call me back.

## 2016-10-20 ENCOUNTER — Telehealth: Payer: Self-pay

## 2016-10-20 NOTE — Telephone Encounter (Signed)
Left a VM for Mindy Collins in reference to the referral for the Provider Referral Exercise Program for her to call back.

## 2016-12-12 ENCOUNTER — Encounter: Payer: Self-pay | Admitting: Obstetrics & Gynecology

## 2016-12-12 ENCOUNTER — Ambulatory Visit (INDEPENDENT_AMBULATORY_CARE_PROVIDER_SITE_OTHER): Payer: 59 | Admitting: Obstetrics & Gynecology

## 2016-12-12 VITALS — BP 142/88

## 2016-12-12 DIAGNOSIS — N766 Ulceration of vulva: Secondary | ICD-10-CM | POA: Diagnosis not present

## 2016-12-12 MED ORDER — VALACYCLOVIR HCL 1 G PO TABS: 1000.0000 mg | ORAL_TABLET | Freq: Two times a day (BID) | ORAL | 3 refills | Status: AC

## 2016-12-12 NOTE — Patient Instructions (Signed)
1. Vulvar ulcer Probable Genital HSV.  Sureswab HSV done.  Valacyclor 1 g BID x 5 days prescribed.  Diagnosis, treatment, risks reviewed with patient.  Inflammatory/immune conditions discussed if not Genital HSV.  If HSV neg and no regression of ulcer, will f/u for Vulvar Bx. - SureSwab HSV, Type 1/2 DNA, PCR  Mindy Collins, it was a pleasure seeing you today!  I will inform you of your results as soon as available.   Genital Herpes Genital herpes is a common sexually transmitted infection (STI) that is caused by a virus. The virus spreads from person to person through sexual contact. Infection can cause itching, blisters, and sores around the genitals or rectum. Symptoms may last several days and then go away This is called an outbreak. However, the virus remains in your body, so you may have more outbreaks in the future. The time between outbreaks varies and can be months or years. Genital herpes affects men and women. It is particularly concerning for pregnant women because the virus can be passed to the baby during delivery and can cause serious problems. Genital herpes is also a concern for people who have a weak disease-fighting (immune) system. What are the causes? This condition is caused by the herpes simplex virus (HSV) type 1 or type 2. The virus may spread through:  Sexual contact with an infected person, including vaginal, anal, and oral sex.  Contact with fluid from a herpes sore.  The skin. This means that you can get herpes from an infected partner even if he or she does not have a visible sore or does not know that he or she is infected.  What increases the risk? You are more likely to develop this condition if:  You have sex with many partners.  You do not use latex condoms during sex.  What are the signs or symptoms? Most people do not have symptoms (asymptomatic) or have mild symptoms that may be mistaken for other skin problems. Symptoms may include:  Small red bumps near  the genitals, rectum, or mouth. These bumps turn into blisters and then turn into sores.  Flu-like symptoms, including: ? Fever. ? Body aches. ? Swollen lymph nodes. ? Headache.  Painful urination.  Pain and itching in the genital area or rectal area.  Vaginal discharge.  Tingling or shooting pain in the legs and buttocks.  Generally, symptoms are more severe and last longer during the first (primary) outbreak. Flu-like symptoms are also more common during the primary outbreak. How is this diagnosed? Genital herpes may be diagnosed based on:  A physical exam.  Your medical history.  Blood tests.  A test of a fluid sample (culture) from an open sore.  How is this treated? There is no cure for this condition, but treatment with antiviral medicines that are taken by mouth (orally) can do the following:  Speed up healing and relieve symptoms.  Help to reduce the spread of the virus to sexual partners.  Limit the chance of future outbreaks, or make future outbreaks shorter.  Lessen symptoms of future outbreaks.  Your health care provider may also recommend pain relief medicines, such as aspirin or ibuprofen. Follow these instructions at home: Sexual activity  Do not have sexual contact during active outbreaks.  Practice safe sex. Latex condoms and female condoms may help prevent the spread of the herpes virus. General instructions  Keep the affected areas dry and clean.  Take over-the-counter and prescription medicines only as told by your health care provider.  Avoid rubbing or touching blisters and sores. If you do touch blisters or sores: ? Wash your hands thoroughly with soap and water. ? Do not touch your eyes afterward.  To help relieve pain or itching, you may take the following actions as directed by your health care provider: ? Apply a cold, wet cloth (cold compress) to affected areas 4-6 times a day. ? Apply a substance that protects your skin and  reduces bleeding (astringent). ? Apply a gel that helps relieve pain around sores (lidocaine gel). ? Take a warm, shallow bath that cleans the genital area (sitz bath).  Keep all follow-up visits as told by your health care provider. This is important. How is this prevented?  Use condoms. Although anyone can get genital herpes during sexual contact, even with the use of a condom, a condom can provide some protection.  Avoid having multiple sexual partners.  Talk with your sexual partner about any symptoms either of you may have. Also, talk with your partner about any history of STIs.  Get tested for STIs before you have sex. Ask your partner to do the same.  Do not have sexual contact if you have symptoms of genital herpes. Contact a health care provider if:  Your symptoms are not improving with medicine.  Your symptoms return.  You have new symptoms.  You have a fever.  You have abdominal pain.  You have redness, swelling, or pain in your eye.  You notice new sores on other parts of your body.  You are a woman and experience bleeding between menstrual periods.  You have had herpes and you become pregnant or plan to become pregnant. Summary  Genital herpes is a common sexually transmitted infection (STI) that is caused by the herpes simplex virus (HSV) type 1 or type 2.  These viruses are most often spread through sexual contact with an infected person.  You are more likely to develop this condition if you have sex with many partners or you have unprotected sex.  Most people do not have symptoms (asymptomatic) or have mild symptoms that may be mistaken for other skin problems. Symptoms occur as outbreaks that may happen months or years apart.  There is no cure for this condition, but treatment with oral antiviral medicines can reduce symptoms, reduce the chance of spreading the virus to a partner, prevent future outbreaks, or shorten future outbreaks. This information is  not intended to replace advice given to you by your health care provider. Make sure you discuss any questions you have with your health care provider. Document Released: 01/25/2000 Document Revised: 12/28/2015 Document Reviewed: 12/28/2015 Elsevier Interactive Patient Education  2017 [employer].

## 2016-12-12 NOTE — Progress Notes (Signed)
    Mindy Collins Jul 27, 1970 [id number]        46 y.o.  G0P0 Abstinent currently  RP:  Rt vulvar burning x 3 days  HPI:  Burning at Rt vulva after passing urine and when taking her shower x 3 days.  Had some bleeding when wiping yesterday, but not this morning.  H/O vulvar warts treated with TCA >20 yrs ago.  No H/O Genital HSV.  No UTI Sx.  No vaginal d/c, odor or itching.  Past medical history,surgical history, problem list, medications, allergies, family history and social history were all reviewed and documented in the EPIC chart.  Directed ROS with pertinent positives and negatives documented in the history of present illness/assessment and plan.  Exam:  Vitals:   12/12/16 1112  BP: (!) 142/88   General appearance:  Normal  Gyn exam:  Vulva:  Ulcer on right inner labia minora about 5x7 mm.  HSV Sureswab done.   Assessment/Plan:  46 y.o. G0P0   1. Vulvar ulcer Probable Genital HSV.  Sureswab HSV done.  Valacyclor 1 g BID x 5 days prescribed.  Diagnosis, treatment, risks reviewed with patient.  Inflammatory/immune conditions discussed if not Genital HSV.  If HSV neg and no regression of ulcer, will f/u for Vulvar Bx. - SureSwab HSV, Type 1/2 DNA, PCR  Counseling >50% x 15 minutes  Princess Bruins MD, 11:32 AM 12/12/2016

## 2016-12-13 DIAGNOSIS — Z719 Counseling, unspecified: Secondary | ICD-10-CM | POA: Diagnosis not present

## 2016-12-16 LAB — SURESWAB HSV, TYPE 1/2 DNA, PCR
HSV 1 DNA: NOT DETECTED
HSV 2 DNA: NOT DETECTED

## 2016-12-22 ENCOUNTER — Ambulatory Visit: Payer: Self-pay | Admitting: *Deleted

## 2016-12-22 NOTE — Telephone Encounter (Signed)
Pain in R shoulder- patient was diagnosed with bilateral carpel tunnel( 2016) patient has been getting sharp pains in her neck down to her thumb in the R arm.  Reason for Disposition . [1] After 2 weeks AND [2] still painful  Answer Assessment - Initial Assessment Questions 1. MECHANISM: "How did the injury happen?"     Patient states this has been progressively getting worse over time- she feels it is related to her carpel tunnel. She does a lot of computer work . The pain is in her neck radiating down the arm- from top of shoulder blade down to hand. Pressure can cause  Tingling and pain- numbness- like going to sleep- nerve pain. Patient gets pain with stretching out arm                      2. ONSET: "When did the injury happen?" (Minutes or hours ago)      Pain gets worse with pressure or activity- working is hard 3. LOCATION: "Where is the injury located?"      Shoulder to finger tips 4. APPEARANCE of INJURY: "What does the injury look like?"      Thumb may be swollen slightly- possibly from wearing brace 5. SEVERITY: "Can you use the arm normally?"      No- patient has to take breaks- or switch hands 6. SWELLING or BRUISING: "is there any swelling or bruising?" If so, ask: "How large is it? (e.g., inches, centimeters)      Slightly swollen thumb- hand area 7. PAIN: "Is there pain?" If so, ask: "How bad is the pain?"    (Scale 1-10; or mild, moderate, severe)     Neck- 10 if bending- collarbone area can be sore, arm- fine unless over using- patient has tried heat and ice with no relief 8. TETANUS: For any breaks in the skin, ask: "When was the last tetanus booster?"     Cut above ear, but symptoms were before that- up to date 9. OTHER SYMPTOMS: "Do you have any other symptoms?"  (e.g., numbness in hand)     Patient is not having any other symptoms that have not been mentioned- other carpel tunnel in L hand is still localized to the hand 10. PREGNANCY: "Is there any chance you are  pregnant?" "When was your last menstrual period?"       No- IUD  Protocols used: ARM INJURY-A-AH

## 2016-12-22 NOTE — Telephone Encounter (Signed)
Left message to book an appointment early if she can't wait to see Dr. Brigitte Pulse on the 14th.   Per Release detailed message left

## 2016-12-24 ENCOUNTER — Ambulatory Visit (INDEPENDENT_AMBULATORY_CARE_PROVIDER_SITE_OTHER): Payer: 59

## 2016-12-24 ENCOUNTER — Encounter: Payer: Self-pay | Admitting: Family Medicine

## 2016-12-24 ENCOUNTER — Other Ambulatory Visit: Payer: Self-pay

## 2016-12-24 ENCOUNTER — Ambulatory Visit (INDEPENDENT_AMBULATORY_CARE_PROVIDER_SITE_OTHER): Payer: 59 | Admitting: Family Medicine

## 2016-12-24 VITALS — BP 126/84 | HR 107 | Temp 99.0°F | Resp 16 | Ht 62.6 in | Wt 283.6 lb

## 2016-12-24 DIAGNOSIS — M5412 Radiculopathy, cervical region: Secondary | ICD-10-CM | POA: Diagnosis not present

## 2016-12-24 DIAGNOSIS — M5410 Radiculopathy, site unspecified: Secondary | ICD-10-CM | POA: Diagnosis not present

## 2016-12-24 DIAGNOSIS — M542 Cervicalgia: Secondary | ICD-10-CM | POA: Diagnosis not present

## 2016-12-24 DIAGNOSIS — Z719 Counseling, unspecified: Secondary | ICD-10-CM | POA: Diagnosis not present

## 2016-12-24 DIAGNOSIS — G5603 Carpal tunnel syndrome, bilateral upper limbs: Secondary | ICD-10-CM | POA: Diagnosis not present

## 2016-12-24 MED ORDER — PREDNISONE 20 MG PO TABS: ORAL_TABLET | ORAL | 0 refills | Status: DC
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
## 2016-12-24 NOTE — Patient Instructions (Addendum)
Do not take any other otc pain medication other than tylenol/acetaminophen - so no aleve, ibuprofen, motrin, advil, etc. Stop the meloxicam until you are off of the prednisone. Then restart daily until your pain is gone or until it has been 6 weeks. If you are still having any pain in 6 weeks, come back to see me so we can proceed with an MRI and consider speciality referral for your neck.    IF you received an x-ray today, you will receive an invoice from [facility]. Please contact [facility] at [phone] with questions or concerns regarding your invoice.   IF you received labwork today, you will receive an invoice from [facility]. Please contact [facility] at [phone] with questions or concerns regarding your invoice.   Our billing staff will not be able to assist you with questions regarding bills from these companies.  You will be contacted with the lab results as soon as they are available. The fastest way to get your results is to activate your My Chart account. Instructions are located on the last page of this paperwork. If you have not heard from [place] regarding the results in 2 weeks, please contact this office.      Cervical Radiculopathy Cervical radiculopathy happens when a nerve in the neck (cervical nerve) is pinched or bruised. This condition can develop because of an injury or as part of the normal aging process. Pressure on the cervical nerves can cause pain or numbness that runs from the neck all the way down into the arm and fingers. Usually, this condition gets better with rest. Treatment may be needed if the condition does not improve. What are the causes? This condition may be caused by:  Injury.  Slipped (herniated) disk.  Muscle tightness in the neck because of overuse.  Arthritis.  Breakdown or degeneration in the bones and joints of the spine (spondylosis) due to aging.  Bone spurs that may develop near the cervical nerves.  What are the  signs or symptoms? Symptoms of this condition include:  Pain that runs from the neck to the arm and hand. The pain can be severe or irritating. It may be worse when the neck is moved.  Numbness or weakness in the affected arm and hand.  How is this diagnosed? This condition may be diagnosed based on symptoms, medical history, and a physical exam. You may also have tests, including:  X-rays.  CT scan.  MRI.  Electromyogram (EMG).  Nerve conduction tests.  How is this treated? In many cases, treatment is not needed for this condition. With rest, the condition usually gets better over time. If treatment is needed, options may include:  Wearing a soft neck collar for short periods of time.  Physical therapy to strengthen your neck muscles.  Medicines, such as NSAIDs, oral corticosteroids, or spinal injections.  Surgery. This may be needed if other treatments do not help. Various types of surgery may be done depending on the cause of your problems.  Follow these instructions at home: Managing pain  Take over-the-counter and prescription medicines only as told by your health care provider.  If directed, apply ice to the affected area. ? Put ice in a plastic bag. ? Place a towel between your skin and the bag. ? Leave the ice on for 20 minutes, 2-3 times per day.  If ice does not help, you can try using heat. Take a warm shower or warm bath, or use a heat pack as told by your health  care provider.  Try a gentle neck and shoulder massage to help relieve symptoms. Activity  Rest as needed. Follow instructions from your health care provider about any restrictions on activities.  Do stretching and strengthening exercises as told by your health care provider or physical therapist. General instructions  If you were given a soft collar, wear it as told by your health care provider.  Use a flat pillow when you sleep.  Keep all follow-up visits as told by your health care  provider. This is important. Contact a health care provider if:  Your condition does not improve with treatment. Get help right away if:  Your pain gets much worse and cannot be controlled with medicines.  You have weakness or numbness in your hand, arm, face, or leg.  You have a high fever.  You have a stiff, rigid neck.  You lose control of your bowels or your bladder (have incontinence).  You have trouble with walking, balance, or speaking. This information is not intended to replace advice given to you by your health care provider. Make sure you discuss any questions you have with your health care provider. Document Released: 10/22/2000 Document Revised: 07/05/2015 Document Reviewed: 03/23/2014 Elsevier Interactive Patient Education  [employer].

## 2016-12-25 DIAGNOSIS — Z719 Counseling, unspecified: Secondary | ICD-10-CM | POA: Diagnosis not present

## 2016-12-31 DIAGNOSIS — Z719 Counseling, unspecified: Secondary | ICD-10-CM | POA: Diagnosis not present

## 2016-12-31 DIAGNOSIS — H18613 Keratoconus, stable, bilateral: Secondary | ICD-10-CM | POA: Diagnosis not present

## 2016-12-31 DIAGNOSIS — H52213 Irregular astigmatism, bilateral: Secondary | ICD-10-CM | POA: Diagnosis not present

## 2017-01-07 DIAGNOSIS — R293 Abnormal posture: Secondary | ICD-10-CM | POA: Diagnosis not present

## 2017-01-07 DIAGNOSIS — M542 Cervicalgia: Secondary | ICD-10-CM | POA: Diagnosis not present

## 2017-01-07 DIAGNOSIS — Z719 Counseling, unspecified: Secondary | ICD-10-CM | POA: Diagnosis not present

## 2017-01-07 DIAGNOSIS — M5412 Radiculopathy, cervical region: Secondary | ICD-10-CM | POA: Diagnosis not present

## 2017-01-12 DIAGNOSIS — M5412 Radiculopathy, cervical region: Secondary | ICD-10-CM | POA: Diagnosis not present

## 2017-01-12 DIAGNOSIS — R293 Abnormal posture: Secondary | ICD-10-CM | POA: Diagnosis not present

## 2017-01-12 DIAGNOSIS — M542 Cervicalgia: Secondary | ICD-10-CM | POA: Diagnosis not present

## 2017-01-14 DIAGNOSIS — R293 Abnormal posture: Secondary | ICD-10-CM | POA: Diagnosis not present

## 2017-01-14 DIAGNOSIS — M542 Cervicalgia: Secondary | ICD-10-CM | POA: Diagnosis not present

## 2017-01-14 DIAGNOSIS — M5412 Radiculopathy, cervical region: Secondary | ICD-10-CM | POA: Diagnosis not present

## 2017-01-23 DIAGNOSIS — R293 Abnormal posture: Secondary | ICD-10-CM | POA: Diagnosis not present

## 2017-01-23 DIAGNOSIS — M542 Cervicalgia: Secondary | ICD-10-CM | POA: Diagnosis not present

## 2017-01-23 DIAGNOSIS — M5412 Radiculopathy, cervical region: Secondary | ICD-10-CM | POA: Diagnosis not present

## 2017-01-26 DIAGNOSIS — M5412 Radiculopathy, cervical region: Secondary | ICD-10-CM | POA: Diagnosis not present

## 2017-01-26 DIAGNOSIS — R293 Abnormal posture: Secondary | ICD-10-CM | POA: Diagnosis not present

## 2017-01-26 DIAGNOSIS — Z719 Counseling, unspecified: Secondary | ICD-10-CM | POA: Diagnosis not present

## 2017-01-26 DIAGNOSIS — M542 Cervicalgia: Secondary | ICD-10-CM | POA: Diagnosis not present

## 2017-01-28 DIAGNOSIS — M5412 Radiculopathy, cervical region: Secondary | ICD-10-CM | POA: Diagnosis not present

## 2017-01-28 DIAGNOSIS — M542 Cervicalgia: Secondary | ICD-10-CM | POA: Diagnosis not present

## 2017-01-28 DIAGNOSIS — R293 Abnormal posture: Secondary | ICD-10-CM | POA: Diagnosis not present

## 2017-01-29 ENCOUNTER — Encounter (INDEPENDENT_AMBULATORY_CARE_PROVIDER_SITE_OTHER): Payer: 59

## 2017-02-02 DIAGNOSIS — R293 Abnormal posture: Secondary | ICD-10-CM | POA: Diagnosis not present

## 2017-02-02 DIAGNOSIS — M5412 Radiculopathy, cervical region: Secondary | ICD-10-CM | POA: Diagnosis not present

## 2017-02-02 DIAGNOSIS — M542 Cervicalgia: Secondary | ICD-10-CM | POA: Diagnosis not present

## 2017-02-06 DIAGNOSIS — M542 Cervicalgia: Secondary | ICD-10-CM | POA: Diagnosis not present

## 2017-02-06 DIAGNOSIS — R293 Abnormal posture: Secondary | ICD-10-CM | POA: Diagnosis not present

## 2017-02-06 DIAGNOSIS — M5412 Radiculopathy, cervical region: Secondary | ICD-10-CM | POA: Diagnosis not present

## 2017-02-12 ENCOUNTER — Encounter: Payer: Self-pay | Admitting: Family Medicine

## 2017-02-16 DIAGNOSIS — R293 Abnormal posture: Secondary | ICD-10-CM | POA: Diagnosis not present

## 2017-02-16 DIAGNOSIS — M5412 Radiculopathy, cervical region: Secondary | ICD-10-CM | POA: Diagnosis not present

## 2017-02-16 DIAGNOSIS — M542 Cervicalgia: Secondary | ICD-10-CM | POA: Diagnosis not present

## 2017-02-18 DIAGNOSIS — M542 Cervicalgia: Secondary | ICD-10-CM | POA: Diagnosis not present

## 2017-02-18 DIAGNOSIS — R293 Abnormal posture: Secondary | ICD-10-CM | POA: Diagnosis not present

## 2017-02-18 DIAGNOSIS — M5412 Radiculopathy, cervical region: Secondary | ICD-10-CM | POA: Diagnosis not present

## 2017-02-20 DIAGNOSIS — R293 Abnormal posture: Secondary | ICD-10-CM | POA: Diagnosis not present

## 2017-02-20 DIAGNOSIS — M542 Cervicalgia: Secondary | ICD-10-CM | POA: Diagnosis not present

## 2017-02-20 DIAGNOSIS — M5412 Radiculopathy, cervical region: Secondary | ICD-10-CM | POA: Diagnosis not present

## 2017-02-25 DIAGNOSIS — M5412 Radiculopathy, cervical region: Secondary | ICD-10-CM | POA: Diagnosis not present

## 2017-02-25 DIAGNOSIS — M542 Cervicalgia: Secondary | ICD-10-CM | POA: Diagnosis not present

## 2017-02-25 DIAGNOSIS — R293 Abnormal posture: Secondary | ICD-10-CM | POA: Diagnosis not present

## 2017-02-27 ENCOUNTER — Encounter: Payer: Self-pay | Admitting: Obstetrics & Gynecology

## 2017-02-27 ENCOUNTER — Ambulatory Visit (INDEPENDENT_AMBULATORY_CARE_PROVIDER_SITE_OTHER): Payer: BLUE CROSS/BLUE SHIELD | Admitting: Obstetrics & Gynecology

## 2017-02-27 VITALS — BP 134/90 | Ht 62.0 in | Wt 280.0 lb

## 2017-02-27 DIAGNOSIS — Z6841 Body Mass Index (BMI) 40.0 and over, adult: Secondary | ICD-10-CM

## 2017-02-27 DIAGNOSIS — Z30431 Encounter for routine checking of intrauterine contraceptive device: Secondary | ICD-10-CM | POA: Diagnosis not present

## 2017-02-27 DIAGNOSIS — Z1151 Encounter for screening for human papillomavirus (HPV): Secondary | ICD-10-CM | POA: Diagnosis not present

## 2017-02-27 DIAGNOSIS — T8332XA Displacement of intrauterine contraceptive device, initial encounter: Secondary | ICD-10-CM

## 2017-02-27 DIAGNOSIS — Z01411 Encounter for gynecological examination (general) (routine) with abnormal findings: Secondary | ICD-10-CM

## 2017-02-27 DIAGNOSIS — D219 Benign neoplasm of connective and other soft tissue, unspecified: Secondary | ICD-10-CM | POA: Diagnosis not present

## 2017-02-27 NOTE — Patient Instructions (Signed)
1. Encounter for gynecological examination with abnormal finding Gynecologic exam limited by obesity and history of fibroids.  Pap test with high risk HPV done.  Breast exam normal.  Will schedule screening mammogram now.  Health labs with family physician.  2. Encounter for routine checking of intrauterine contraceptive device (IUD) Marinol IUD well-tolerated since June 2016.  IUD strings not seen.  Will follow up for a pelvic ultrasound to locate the IUD.  3. Intrauterine contraceptive device threads lost, initial encounter Follow-up pelvic ultrasound for localization of IUD. - US Transvaginal Non-OB; Future  4. Fibroids Evaluate stability of fibroids by pelvic ultrasound. - US Transvaginal Non-OB; Future  5. Class 3 severe obesity due to excess calories without serious comorbidity with body mass index (BMI) of 50.0 to 59.9 in adult [facility]) Low calorie/low carb diet and physical activity plan will be organized by the [facility] team.  Morbid obesity is limiting the gynecologic exam.  Therefore the patient will follow up for pelvic ultrasound. - US Transvaginal Non-OB; Future  Mindy Collins, it was a pleasure seeing you today!  I will inform you of your results as soon as they are available.  Health Maintenance, Female Adopting a healthy lifestyle and getting preventive care can go a long way to promote health and wellness. Talk with your health care provider about what schedule of regular examinations is right for you. This is a good chance for you to check in with your provider about disease prevention and staying healthy. In between checkups, there are plenty of things you can do on your own. Experts have done a lot of research about which lifestyle changes and preventive measures are most likely to keep you healthy. Ask your health care provider for more information. Weight and diet Eat a healthy diet  Be sure to include plenty of vegetables, fruits, low-fat dairy products, and lean  protein.  Do not eat a lot of foods high in solid fats, added sugars, or salt.  Get regular exercise. This is one of the most important things you can do for your health. ? Most adults should exercise for at least 150 minutes each week. The exercise should increase your heart rate and make you sweat (moderate-intensity exercise). ? Most adults should also do strengthening exercises at least twice a week. This is in addition to the moderate-intensity exercise.  Maintain a healthy weight  Body mass index (BMI) is a measurement that can be used to identify possible weight problems. It estimates body fat based on height and weight. Your health care provider can help determine your BMI and help you achieve or maintain a healthy weight.  For females 45 years of age and older: ? A BMI below 18.5 is considered underweight. ? A BMI of 18.5 to 24.9 is normal. ? A BMI of 25 to 29.9 is considered overweight. ? A BMI of 30 and above is considered obese.  Watch levels of cholesterol and blood lipids  You should start having your blood tested for lipids and cholesterol at 45 years of age, then have this test every 5 years.  You may need to have your cholesterol levels checked more often if: ? Your lipid or cholesterol levels are high. ? You are older than 45 years of age. ? You are at high risk for heart disease.  Cancer screening Lung Cancer  Lung cancer screening is recommended for adults 26-53 years old who are at high risk for lung cancer because of a history of smoking.  A yearly  low-dose CT scan of the lungs is recommended for people who: ? Currently smoke. ? Have quit within the past 15 years. ? Have at least a 30-pack-year history of smoking. A pack year is smoking an average of one pack of cigarettes a day for 1 year.  Yearly screening should continue until it has been 15 years since you quit.  Yearly screening should stop if you develop a health problem that would prevent you from  having lung cancer treatment.  Breast Cancer  Practice breast self-awareness. This means understanding how your breasts normally appear and feel.  It also means doing regular breast self-exams. Let your health care provider know about any changes, no matter how small.  If you are in your 20s or 30s, you should have a clinical breast exam (CBE) by a health care provider every 1-3 years as part of a regular health exam.  If you are 26 or older, have a CBE every year. Also consider having a breast X-ray (mammogram) every year.  If you have a family history of breast cancer, talk to your health care provider about genetic screening.  If you are at high risk for breast cancer, talk to your health care provider about having an MRI and a mammogram every year.  Breast cancer gene (BRCA) assessment is recommended for women who have family members with BRCA-related cancers. BRCA-related cancers include: ? Breast. ? Ovarian. ? Tubal. ? Peritoneal cancers.  Results of the assessment will determine the need for genetic counseling and BRCA1 and BRCA2 testing.  Cervical Cancer Your health care provider may recommend that you be screened regularly for cancer of the pelvic organs (ovaries, uterus, and vagina). This screening involves a pelvic examination, including checking for microscopic changes to the surface of your cervix (Pap test). You may be encouraged to have this screening done every 3 years, beginning at age 57.  For women ages 82-65, health care providers may recommend pelvic exams and Pap testing every 3 years, or they may recommend the Pap and pelvic exam, combined with testing for human papilloma virus (HPV), every 5 years. Some types of HPV increase your risk of cervical cancer. Testing for HPV may also be done on women of any age with unclear Pap test results.  Other health care providers may not recommend any screening for nonpregnant women who are considered low risk for pelvic cancer  and who do not have symptoms. Ask your health care provider if a screening pelvic exam is right for you.  If you have had past treatment for cervical cancer or a condition that could lead to cancer, you need Pap tests and screening for cancer for at least 20 years after your treatment. If Pap tests have been discontinued, your risk factors (such as having a new sexual partner) need to be reassessed to determine if screening should resume. Some women have medical problems that increase the chance of getting cervical cancer. In these cases, your health care provider may recommend more frequent screening and Pap tests.  Colorectal Cancer  This type of cancer can be detected and often prevented.  Routine colorectal cancer screening usually begins at 45 years of age and continues through 45 years of age.  Your health care provider may recommend screening at an earlier age if you have risk factors for colon cancer.  Your health care provider may also recommend using home test kits to check for hidden blood in the stool.  A small camera at the end of a  tube can be used to examine your colon directly (sigmoidoscopy or colonoscopy). This is done to check for the earliest forms of colorectal cancer.  Routine screening usually begins at age 29.  Direct examination of the colon should be repeated every 5-10 years through 45 years of age. However, you may need to be screened more often if early forms of precancerous polyps or small growths are found.  Skin Cancer  Check your skin from head to toe regularly.  Tell your health care provider about any new moles or changes in moles, especially if there is a change in a mole's shape or color.  Also tell your health care provider if you have a mole that is larger than the size of a pencil eraser.  Always use sunscreen. Apply sunscreen liberally and repeatedly throughout the day.  Protect yourself by wearing long sleeves, pants, a wide-brimmed hat, and  sunglasses whenever you are outside.  Heart disease, diabetes, and high blood pressure  High blood pressure causes heart disease and increases the risk of stroke. High blood pressure is more likely to develop in: ? People who have blood pressure in the high end of the normal range (130-139/85-89 mm Hg). ? People who are overweight or obese. ? People who are African American.  If you are 77-35 years of age, have your blood pressure checked every 3-5 years. If you are 55 years of age or older, have your blood pressure checked every year. You should have your blood pressure measured twice-once when you are at a hospital or clinic, and once when you are not at a hospital or clinic. Record the average of the two measurements. To check your blood pressure when you are not at a hospital or clinic, you can use: ? An automated blood pressure machine at a pharmacy. ? A home blood pressure monitor.  If you are between 29 years and 18 years old, ask your health care provider if you should take aspirin to prevent strokes.  Have regular diabetes screenings. This involves taking a blood sample to check your fasting blood sugar level. ? If you are at a normal weight and have a low risk for diabetes, have this test once every three years after 45 years of age. ? If you are overweight and have a high risk for diabetes, consider being tested at a younger age or more often. Preventing infection Hepatitis B  If you have a higher risk for hepatitis B, you should be screened for this virus. You are considered at high risk for hepatitis B if: ? You were born in a country where hepatitis B is common. Ask your health care provider which countries are considered high risk. ? Your parents were born in a high-risk country, and you have not been immunized against hepatitis B (hepatitis B vaccine). ? You have HIV or AIDS. ? You use needles to inject street drugs. ? You live with someone who has hepatitis B. ? You have  had sex with someone who has hepatitis B. ? You get hemodialysis treatment. ? You take certain medicines for conditions, including cancer, organ transplantation, and autoimmune conditions.  Hepatitis C  Blood testing is recommended for: ? Everyone born from [birth year] through 1963. ? Anyone with known risk factors for hepatitis C.  Sexually transmitted infections (STIs)  You should be screened for sexually transmitted infections (STIs) including gonorrhea and chlamydia if: ? You are sexually active and are younger than 45 years of age. ? You are older than  45 years of age and your health care provider tells you that you are at risk for this type of infection. ? Your sexual activity has changed since you were last screened and you are at an increased risk for chlamydia or gonorrhea. Ask your health care provider if you are at risk.  If you do not have HIV, but are at risk, it may be recommended that you take a prescription medicine daily to prevent HIV infection. This is called pre-exposure prophylaxis (PrEP). You are considered at risk if: ? You are sexually active and do not regularly use condoms or know the HIV status of your partner(s). ? You take drugs by injection. ? You are sexually active with a partner who has HIV.  Talk with your health care provider about whether you are at high risk of being infected with HIV. If you choose to begin PrEP, you should first be tested for HIV. You should then be tested every 3 months for as long as you are taking PrEP. Pregnancy  If you are premenopausal and you may become pregnant, ask your health care provider about preconception counseling.  If you may become pregnant, take 400 to 800 micrograms (mcg) of folic acid every day.  If you want to prevent pregnancy, talk to your health care provider about birth control (contraception). Osteoporosis and menopause  Osteoporosis is a disease in which the bones lose minerals and strength with aging. This  can result in serious bone fractures. Your risk for osteoporosis can be identified using a bone density scan.  If you are 20 years of age or older, or if you are at risk for osteoporosis and fractures, ask your health care provider if you should be screened.  Ask your health care provider whether you should take a calcium or vitamin D supplement to lower your risk for osteoporosis.  Menopause may have certain physical symptoms and risks.  Hormone replacement therapy may reduce some of these symptoms and risks. Talk to your health care provider about whether hormone replacement therapy is right for you. Follow these instructions at home:  Schedule regular health, dental, and eye exams.  Stay current with your immunizations.  Do not use any tobacco products including cigarettes, chewing tobacco, or electronic cigarettes.  If you are pregnant, do not drink alcohol.  If you are breastfeeding, limit how much and how often you drink alcohol.  Limit alcohol intake to no more than 1 drink per day for nonpregnant women. One drink equals 12 ounces of beer, 5 ounces of wine, or 1 ounces of hard liquor.  Do not use street drugs.  Do not share needles.  Ask your health care provider for help if you need support or information about quitting drugs.  Tell your health care provider if you often feel depressed.  Tell your health care provider if you have ever been abused or do not feel safe at home. This information is not intended to replace advice given to you by your health care provider. Make sure you discuss any questions you have with your health care provider. Document Released: 08/12/2010 Document Revised: 07/05/2015 Document Reviewed: 10/31/2014 Elsevier Interactive Patient Education  [employer].

## 2017-02-27 NOTE — Progress Notes (Addendum)
Mindy Collins 12-15-1970 [id number]   History:    47 y.o. G0 Single  RP:  Established patient presenting for annual gyn exam   HPI: Well on Mirena IUD since June 2016.  No menstrual period.  No pelvic pain.  Normal vaginal secretions.  Presented November 2018 with a vulvar ulcer for which HSV 1 and 2 were negative.  Ulcer resolved.  Abstinent.  Breasts normal.  Urine and bowel movements normal.  Entered the [facility] program for weight loss.  Will be on a low calorie diet with a physical activity program for the next year.  Body mass index 51.21.  Past medical history,surgical history, family history and social history were all reviewed and documented in the EPIC chart.  Gynecologic History No LMP recorded. Patient is not currently having periods (Reason: IUD). Contraception: abstinence and IUD Last Pap: 2016. Results were: Negative Last mammogram: 02/2016. Results were: Negative  Obstetric History OB History  Gravida Para Term Preterm AB Living  0            SAB TAB Ectopic Multiple Live Births                    ROS: A ROS was performed and pertinent positives and negatives are included in the history.  GENERAL: No fevers or chills. HEENT: No change in vision, no earache, sore throat or sinus congestion. NECK: No pain or stiffness. CARDIOVASCULAR: No chest pain or pressure. No palpitations. PULMONARY: No shortness of breath, cough or wheeze. GASTROINTESTINAL: No abdominal pain, nausea, vomiting or diarrhea, melena or bright red blood per rectum. GENITOURINARY: No urinary frequency, urgency, hesitancy or dysuria. MUSCULOSKELETAL: No joint or muscle pain, no back pain, no recent trauma. DERMATOLOGIC: No rash, no itching, no lesions. ENDOCRINE: No polyuria, polydipsia, no heat or cold intolerance. No recent change in weight. HEMATOLOGICAL: No anemia or easy bruising or bleeding. NEUROLOGIC: No headache, seizures, numbness, tingling or weakness. PSYCHIATRIC: No depression, no loss of  interest in normal activity or change in sleep pattern.     Exam:   BP 134/90   Ht 5\' 2"  (1.575 m)   Wt 280 lb (127 kg)   BMI 51.21 kg/m   Body mass index is 51.21 kg/m.  General appearance : Well developed well nourished female. No acute distress HEENT: Eyes: no retinal hemorrhage or exudates,  Neck supple, trachea midline, no carotid bruits, no thyroidmegaly Lungs: Clear to auscultation, no rhonchi or wheezes, or rib retractions  Heart: Regular rate and rhythm, no murmurs or gallops Breast:Examined in sitting and supine position were symmetrical in appearance, no palpable masses or tenderness,  no skin retraction, no nipple inversion, no nipple discharge, no skin discoloration, no axillary or supraclavicular lymphadenopathy Abdomen: no palpable masses or tenderness, no rebound or guarding Extremities: no edema or skin discoloration or tenderness  Pelvic: Vulva normal  Bartholin, Urethra, Skene Glands: Within normal limits             Vagina: No gross lesions or discharge  Cervix: No gross lesions or discharge.  Pap/HR HPV done.  IUD strings not visible.  Uterus  AV, exam limited by obesity.  H/O Fibroids.  Non-tender, mobile.  Adnexa  Without masses or tenderness  Anus and perineum  normal    Assessment/Plan:  47 y.o. female for annual exam   1. Encounter for gynecological examination with abnormal finding Gynecologic exam limited by obesity and history of fibroids.  Pap test with high risk HPV done.  Breast  exam normal.  Will schedule screening mammogram now.  Health labs with family physician.  2. Encounter for routine checking of intrauterine contraceptive device (IUD) Mirena IUD well-tolerated since June 2016.  IUD strings not seen.  Will follow up for a pelvic ultrasound to locate the IUD.  3. Intrauterine contraceptive device threads lost, initial encounter Follow-up pelvic ultrasound for localization of IUD. - US Transvaginal Non-OB; Future  4. Fibroids Evaluate  stability of fibroids by pelvic ultrasound. - US Transvaginal Non-OB; Future  5. Class 3 severe obesity due to excess calories without serious comorbidity with body mass index (BMI) of 50.0 to 59.9 in adult [facility]) Low calorie/low carb diet and physical activity plan will be organized by the [facility] team.  Morbid obesity is limiting the gynecologic exam.  Therefore the patient will follow up for pelvic ultrasound. - US Transvaginal Non-OB; Future  Princess Bruins MD, 8:58 AM 02/27/2017

## 2017-03-02 ENCOUNTER — Ambulatory Visit: Payer: Self-pay | Admitting: Family Medicine

## 2017-03-03 LAB — PAP, TP IMAGING W/ HPV RNA, RFLX HPV TYPE 16,18/45: HPV DNA High Risk: NOT DETECTED

## 2017-03-05 ENCOUNTER — Ambulatory Visit (INDEPENDENT_AMBULATORY_CARE_PROVIDER_SITE_OTHER): Payer: BLUE CROSS/BLUE SHIELD | Admitting: Family Medicine

## 2017-03-05 ENCOUNTER — Other Ambulatory Visit: Payer: Self-pay

## 2017-03-05 ENCOUNTER — Encounter: Payer: Self-pay | Admitting: Family Medicine

## 2017-03-05 VITALS — BP 128/74 | HR 113 | Temp 98.1°F | Resp 20 | Ht 62.0 in | Wt 280.2 lb

## 2017-03-05 DIAGNOSIS — I10 Essential (primary) hypertension: Secondary | ICD-10-CM | POA: Diagnosis not present

## 2017-03-05 DIAGNOSIS — E538 Deficiency of other specified B group vitamins: Secondary | ICD-10-CM

## 2017-03-05 DIAGNOSIS — R740 Nonspecific elevation of levels of transaminase and lactic acid dehydrogenase [LDH]: Secondary | ICD-10-CM

## 2017-03-05 DIAGNOSIS — G6289 Other specified polyneuropathies: Secondary | ICD-10-CM

## 2017-03-05 DIAGNOSIS — R5382 Chronic fatigue, unspecified: Secondary | ICD-10-CM | POA: Diagnosis not present

## 2017-03-05 DIAGNOSIS — R7303 Prediabetes: Secondary | ICD-10-CM

## 2017-03-05 DIAGNOSIS — M5412 Radiculopathy, cervical region: Secondary | ICD-10-CM

## 2017-03-05 DIAGNOSIS — F5101 Primary insomnia: Secondary | ICD-10-CM | POA: Diagnosis not present

## 2017-03-05 DIAGNOSIS — E785 Hyperlipidemia, unspecified: Secondary | ICD-10-CM

## 2017-03-05 DIAGNOSIS — R7401 Elevation of levels of liver transaminase levels: Secondary | ICD-10-CM

## 2017-03-05 LAB — POCT URINALYSIS DIP (MANUAL ENTRY)
Bilirubin, UA: NEGATIVE
Blood, UA: NEGATIVE
Glucose, UA: NEGATIVE mg/dL
Ketones, POC UA: NEGATIVE mg/dL
Leukocytes, UA: NEGATIVE
Nitrite, UA: NEGATIVE
Protein Ur, POC: NEGATIVE mg/dL
Spec Grav, UA: 1.025 (ref 1.010–1.025)
Urobilinogen, UA: 0.2 E.U./dL
pH, UA: 6 (ref 5.0–8.0)

## 2017-03-05 MED ORDER — SUVOREXANT 15 MG PO TABS: 15.0000 mg | ORAL_TABLET | Freq: Every day | ORAL | 0 refills | Status: DC

## 2017-03-05 MED ORDER — SUVOREXANT 10 MG PO TABS: 10.0000 mg | ORAL_TABLET | Freq: Every day | ORAL | 0 refills | Status: DC

## 2017-03-05 MED ORDER — SUVOREXANT 20 MG PO TABS: 20.0000 mg | ORAL_TABLET | Freq: Every day | ORAL | 0 refills | Status: DC

## 2017-03-05 NOTE — Progress Notes (Addendum)
Subjective:    Patient ID: Mindy Collins, female    DOB: 1970-02-17, 47 y.o.   MRN: [id number] Chief Complaint  Patient presents with  . Physical Therapy    Pt states pinch nerve got better but then came back with improvement. Pt states the numbness is still there. Pt states she would like to know if she should continue with PT or try something new.  . Follow-up  . Referral    Pt would like a referral to a sleep specialist  . Medication Refill    Olmesartan Medoxomil-HCTZ    HPI  Chronic Medical Conditions: Transaminitis: fatty liver due to weight and pre-diabetes.. [place] 06/2008 showed fatty liver. 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 Going to start 2/15/  Wakes at night biting her tongue.  She has been told she snores. No reports of apnea. Has insomnia at baseline so she doesn't get enough regular sleep to tell if her fatigue or waking non-rested is due to insomnia or sleep apnea.  At night she feels wired, she just can't go to sleep.  Sometimes woken up by gerd if she eats something spicy before bed.    PT went very well for her cervical pinched nerve so she started doing more housework after which her sxs came back - she went back to PT which helped along with massage and muscle relaxers which worked. However, the exercises seemed to be bothering her carpal tunnel and now has had numbness in both hands on the otp of her fingertips for the past 2 weeks. No burning/pins/needles. Does get worse with typing/writing.  In her 2-5th finger tips.  Still with tingling pricking nerve jumping sensations throughout her entires right arm.  Has tried icing, heating but when she reaches and holds her neck the whole arm feels   Past Medical History:  Diagnosis Date  . Anemia   . Carpal tunnel syndrome, bilateral   . Colitis   . Dysmenorrhea   . Fatty liver   . GERD (gastroesophageal reflux disease)   . HPV (human papilloma virus) infection    WARTS/ TCA [place]   . Hypertension   . Obesity   . Obesity   . Seasonal allergies    Past Surgical History:  Procedure Laterality Date  . GYNECOLOGIC CRYOSURGERY  1993   DYSPLASIA CERVIX/   . MYOMECTOMY  02/17/2011   Procedure: MYOMECTOMY;  Surgeon: Terrance Mass, MD;  Location: [facility];  Service: Gynecology;  Laterality: N/A;  I am hoping to get a 7:30am time on Dec 4, Dec 11 or Dec 12. It not available I will check on some 1:00 pm times. Thanks  . MYOMECTOMY ABDOMINAL APPROACH  04/31/2007  . OVARIAN CYST REMOVAL  02/17/2011   Procedure: OVARIAN CYSTECTOMY;  Surgeon: Terrance Mass, MD;  Location: [facility];  Service: Gynecology;  Laterality: Left;   Current Outpatient Medications on File Prior to Visit  Medication Sig Dispense Refill  . cyclobenzaprine (FLEXERIL) 10 MG tablet Take 1 tablet (10 mg total) by mouth 3 (three) times daily as needed for muscle spasms. 30 tablet 2  . fluticasone (FLONASE) 50 MCG/ACT nasal spray instill 1 spray into each nostril twice a day  0  . levonorgestrel (MIRENA) 20 MCG/24HR IUD 1 each by Intrauterine route once.    . loratadine (CLARITIN) 10 MG tablet Take 10 mg by mouth daily.    . meloxicam (MOBIC) 15 MG tablet take 1 tablet by mouth daily  if needed  0  . Multiple Vitamin (MULTIVITAMIN) tablet Take 1 tablet by mouth daily.    Marland Kitchen olmesartan-hydrochlorothiazide (BENICAR HCT) 20-12.5 MG tablet Take 1 tablet by mouth daily. 90 tablet 3   No current facility-administered medications on file prior to visit.    Allergies  Allergen Reactions  . Esomeprazole Magnesium Shortness Of Breath    Nervousness; tolerates Aciphex.  . Adhesive [Tape] Itching and Rash    Itching and rash with adhesive tape  . Latex Itching and Dermatitis    Skin blistering  . Levocetirizine Dihydrochloride Other (See Comments)    Muscle jerks  . Nexium [Esomeprazole]    Family History  Problem Relation Age of Onset  . Hypertension Mother   . Breast cancer Mother   . Cancer Mother   . Heart  disease Maternal Grandmother   . Cancer Father        LUNG  . Breast cancer Maternal Aunt   . Diabetes Maternal Grandfather   . Cancer Paternal Aunt   . Ovarian cancer Paternal Aunt    Social History   Socioeconomic History  . Marital status: Single    Spouse name: None  . Number of children: 0  . Years of education: None  . Highest education level: None  Social Needs  . Financial resource strain: None  . Food insecurity - worry: None  . Food insecurity - inability: None  . Transportation needs - medical: None  . Transportation needs - non-medical: None  Occupational History  . Occupation: Multimedia programmer: STUDENT    Comment: [employer]  Tobacco Use  . Smoking status: Never Smoker  . Smokeless tobacco: Never Used  . Tobacco comment: denies tobacco   Substance and Sexual Activity  . Alcohol use: No    Alcohol/week: 0.0 oz  . Drug use: No  . Sexual activity: Yes    Birth control/protection: IUD  Other Topics Concern  . None  Social History Narrative   Graduate degree from [employer] in woman/gender studies.   Lives with her mother.   Depression screen [facility] 2/9 03/05/2017 12/24/2016 09/27/2016 07/28/2016 07/21/2016  Decreased Interest 0 0 0 0 0  Down, Depressed, Hopeless 0 0 0 0 0  PHQ - 2 Score 0 0 0 0 0    Review of Systems See hpi    Objective:   Physical Exam  Constitutional: She is oriented to person, place, and time. She appears well-developed and well-nourished. No distress.  HENT:  Head: Normocephalic and atraumatic.  Right Ear: External ear normal.  Left Ear: External ear normal.  Eyes: Conjunctivae are normal. No scleral icterus.  Neck: Normal range of motion. Neck supple. No thyromegaly present.  Cardiovascular: Normal rate, regular rhythm, normal heart sounds and intact distal pulses.  Pulmonary/Chest: Effort normal and breath sounds normal. No respiratory distress.  Musculoskeletal: She exhibits no edema.  Lymphadenopathy:    She has no  cervical adenopathy.  Neurological: She is alert and oriented to person, place, and time. A sensory deficit (decreased sensation in Rt arm and Rt fingers 2-5.) is present.  Skin: Skin is warm and dry. She is not diaphoretic. No erythema.  Psychiatric: She has a normal mood and affect. Her behavior is normal.     UA NML    BP 128/74 (BP Location: Left Arm, Patient Position: Sitting, Cuff Size: Large)   Pulse (!) 113   Temp 98.1 F (36.7 C) (Oral)   Resp 20   Ht 5\' 2"  (1.575  m)   Wt 280 lb 3.2 oz (127.1 kg)   SpO2 95%   BMI 51.25 kg/m   Assessment & Plan:   1. Chronic fatigue - will refer for OSA testing - both dentist and endodontist mentioned this to her and certainly has many of the risk factors/comorbidities.  2. HYPERTENSION, BENIGN - well controlled, cont olmesartan-hctz 20-12.5  3. Morbid obesity ([place])   4. Prediabetes - stable  5. Hyperlipidemia LDL goal <130 - lipids increasing today but ASCVD risk 2.8% and as pt is starting medical weight loss program, expect lipid panel will also improve.  6. Other polyneuropathy - labs today show vitamin B12 deficiency - have pt start subligual supplement and recheck at next visit.  7. Right cervical radiculopathy   8.      Elevated transaminases - almost def fatty liver due to BMI 51, continued weight gain, and HLD with LDL in 160s. However, no prior imaging of liver - last abd imaging CT 2011 read normal liver.  Monitor LFTs during weight loss program and if cont to worsen -> needs RUQ [place].  9.      Primary insomnia - no meds prior but will be good candidate for belsomra since describes feeling to awake at night - also w/ frequent wakenings but will see on sleep study  Orders Placed This Encounter  Procedures  . MR Cervical Spine Wo Contrast    Standing Status:   Future    Standing Expiration Date:   05/04/2018    Order Specific Question:   What is the patient's sedation requirement?    Answer:   No Sedation    Order Specific  Question:   Does the patient have a pacemaker or implanted devices?    Answer:   No    Order Specific Question:   Preferred imaging location?    Answer:   [facility] (table limit-550lbs)    Order Specific Question:   Radiology Contrast Protocol - do NOT remove file path    Answer:   [email or web address]  . Lipid panel    Order Specific Question:   Has the patient fasted?    Answer:   Yes  . Comprehensive metabolic panel    Order Specific Question:   Has the patient fasted?    Answer:   Yes  . CBC with Differential/Platelet  . TSH  . Hemoglobin A1c  . Vitamin B12  . Ambulatory referral to Sleep Studies    Referral Priority:   Routine    Referral Type:   Consultation    Referral Reason:   Specialty Services Required    Number of Visits Requested:   1  . POCT urinalysis dipstick    Meds ordered this encounter  Medications  . Suvorexant (BELSOMRA) 10 MG TABS    Sig: Take 10 mg by mouth at bedtime.    Dispense:  10 tablet    Refill:  0  . Suvorexant (BELSOMRA) 15 MG TABS    Sig: Take 15 mg by mouth at bedtime.    Dispense:  10 tablet    Refill:  0  . Suvorexant (BELSOMRA) 20 MG TABS    Sig: Take 20 mg by mouth at bedtime.    Dispense:  10 tablet    Refill:  0  . olmesartan-hydrochlorothiazide (BENICAR HCT) 20-12.5 MG tablet    Sig: Take 1 tablet by mouth daily.    Dispense:  90 tablet    Refill:  [place], M.D.  Primary Care at [facility] [address] [place], [place] [address] [phone] phone [phone] fax  03/08/17 2:00 PM

## 2017-03-05 NOTE — Patient Instructions (Addendum)
IF you received an x-ray today, you will receive an invoice from [facility]. Please contact [facility] at [phone] with questions or concerns regarding your invoice.   IF you received labwork today, you will receive an invoice from [facility]. Please contact [facility] at [phone] with questions or concerns regarding your invoice.   Our billing staff will not be able to assist you with questions regarding bills from these companies.  You will be contacted with the lab results as soon as they are available. The fastest way to get your results is to activate your My Chart account. Instructions are located on the last page of this paperwork. If you have not heard from [place] regarding the results in 2 weeks, please contact this office.    Sleep Studies A sleep study (polysomnogram) is a series of tests done while you are sleeping. It can show how well you sleep. This can help your health care provider diagnose a sleep disorder and show how severe your sleep disorder is. A sleep study may lead to treatment that will help you sleep better and prevent other medical problems caused by poor sleep. If you have a sleep disorder, you may also be at risk for:  Sleep-related accidents.  High blood pressure.  Heart disease.  Stroke.  Other medical conditions.  Sleep disorders are common. Your health care provider may suspect a sleep disorder if you:  Have loud snoring most nights.  Have brief periods when you stop breathing at night.  Feel sleepy on most days.  Fall asleep suddenly during the day.  Have trouble falling asleep or staying asleep.  Feel like you need to move your legs when trying to fall asleep.  Have dreams that seem very real shortly after falling asleep.  Feel like you cannot move when you first wake up.  Which tests will I need to have? Most sleep studies last all night and include these tests:  Recordings of your brain  activity.  Recordings of your eye movements.  Recording of your heart rate and rhythm.  Blood pressure readings.  Readings of the amount of oxygen in your blood.  Measurements of your chest and belly movement as you breathe during sleep.  If you have signs of the sleep disorder called sleep apnea during your test, you may get a mask to wear for the second half of the night.  The mask provides continuous positive airway pressure (CPAP). This may improve sleep apnea significantly.  You will then have all tests done again with the mask in place to see if your measurements and recordings change.  How are sleep studies done? Most sleep studies are done over one full night of sleep.  You will arrive at the study center in the evening and can go home in the morning.  Bring your pajamas and toothbrush.  Do not have caffeine on the day of your sleep study.  Your health care provider will let you know if you need to stop taking any of your regular medicines before the test.  To do the tests included in a polysomnogram, you will have:  Round, sticky patches with sensors attached to recording wires (electrodes) placed on your scalp, face, chest, and limbs.  Wires from all the electrodes and sensors run from your bed to a computer. The wires can be taken off and put back on if you need to get out of bed to go to the bathroom.  A sensor placed over your nose  to measure airflow.  A finger clip put on one finger to measure your blood oxygen level.  A belt around your belly and a belt around your chest to measure breathing movements.  Where are sleep studies done? Sleep studies are done at sleep centers. A sleep center may be inside a hospital, office, or clinic. The room where you have the study may look like a hospital room or a hotel room. The health care providers doing the study may come in and out of the room during the study. Most of the time, they will be in another room monitoring  your test. How is information from sleep studies helpful? A polysomnogram can be used along with your medical history and a physical exam to diagnose conditions, such as:  Sleep apnea.  Restless legs syndrome.  Sleep-related seizure disorders.  Sleep-related movement disorders.  A medical doctor who specializes in sleep will evaluate your sleep study. The specialist will share the results with your primary health care provider. Treatments based on your sleep study may include:  Improving your sleep habits (sleep hygiene).  Wearing a CPAP mask.  Wearing an oral device at night to improve breathing and reduce snoring.  Taking medicine for: ? Restless legs syndrome. ? Sleep-related seizure disorder. ? Sleep-related movement disorder.  This information is not intended to replace advice given to you by your health care provider. Make sure you discuss any questions you have with your health care provider. Document Released: 08/03/2002 Document Revised: 09/23/2015 Document Reviewed: 04/04/2013 Elsevier Interactive Patient Education  2018 [place].  Sleep Apnea Sleep apnea is a condition in which breathing pauses or becomes shallow during sleep. Episodes of sleep apnea usually last 10 seconds or longer, and they may occur as many as 20 times an hour. Sleep apnea disrupts your sleep and keeps your body from getting the rest that it needs. This condition can increase your risk of certain health problems, including:  Heart attack.  Stroke.  Obesity.  Diabetes.  Heart failure.  Irregular heartbeat.  There are three kinds of sleep apnea:  Obstructive sleep apnea. This kind is caused by a blocked or collapsed airway.  Central sleep apnea. This kind happens when the part of the brain that controls breathing does not send the correct signals to the muscles that control breathing.  Mixed sleep apnea. This is a combination of obstructive and central sleep apnea.  What are the  causes? The most common cause of this condition is a collapsed or blocked airway. An airway can collapse or become blocked if:  Your throat muscles are abnormally relaxed.  Your tongue and tonsils are larger than normal.  You are overweight.  Your airway is smaller than normal.  What increases the risk? This condition is more likely to develop in people who:  Are overweight.  Smoke.  Have a smaller than normal airway.  Are elderly.  Are female.  Drink alcohol.  Take sedatives or tranquilizers.  Have a family history of sleep apnea.  What are the signs or symptoms? Symptoms of this condition include:  Trouble staying asleep.  Daytime sleepiness and tiredness.  Irritability.  Loud snoring.  Morning headaches.  Trouble concentrating.  Forgetfulness.  Decreased interest in sex.  Unexplained sleepiness.  Mood swings.  Personality changes.  Feelings of depression.  Waking up often during the night to urinate.  Dry mouth.  Sore throat.  How is this diagnosed? This condition may be diagnosed with:  A medical history.  A physical  exam.  A series of tests that are done while you are sleeping (sleep study). These tests are usually done in a sleep lab, but they may also be done at home.  How is this treated? Treatment for this condition aims to restore normal breathing and to ease symptoms during sleep. It may involve managing health issues that can affect breathing, such as high blood pressure or obesity. Treatment may include:  Sleeping on your side.  Using a decongestant if you have nasal congestion.  Avoiding the use of depressants, including alcohol, sedatives, and narcotics.  Losing weight if you are overweight.  Making changes to your diet.  Quitting smoking.  Using a device to open your airway while you sleep, such as: ? An oral appliance. This is a custom-made mouthpiece that shifts your lower jaw forward. ? A continuous positive  airway pressure (CPAP) device. This device delivers oxygen to your airway through a mask. ? A nasal expiratory positive airway pressure (EPAP) device. This device has valves that you put into each nostril. ? A bi-level positive airway pressure (BPAP) device. This device delivers oxygen to your airway through a mask.  Surgery if other treatments do not work. During surgery, excess tissue is removed to create a wider airway.  It is important to get treatment for sleep apnea. Without treatment, this condition can lead to:  High blood pressure.  Coronary artery disease.  (Men) An inability to achieve or maintain an erection (impotence).  Reduced thinking abilities.  Follow these instructions at home:  Make any lifestyle changes that your health care provider recommends.  Eat a healthy, well-balanced diet.  Take over-the-counter and prescription medicines only as told by your health care provider.  Avoid using depressants, including alcohol, sedatives, and narcotics.  Take steps to lose weight if you are overweight.  If you were given a device to open your airway while you sleep, use it only as told by your health care provider.  Do not use any tobacco products, such as cigarettes, chewing tobacco, and e-cigarettes. If you need help quitting, ask your health care provider.  Keep all follow-up visits as told by your health care provider. This is important. Contact a health care provider if:  The device that you received to open your airway during sleep is uncomfortable or does not seem to be working.  Your symptoms do not improve.  Your symptoms get worse. Get help right away if:  You develop chest pain.  You develop shortness of breath.  You develop discomfort in your back, arms, or stomach.  You have trouble speaking.  You have weakness on one side of your body.  You have drooping in your face. These symptoms may represent a serious problem that is an emergency. Do  not wait to see if the symptoms will go away. Get medical help right away. Call your local emergency services (911 in the [place] Do not drive yourself to the hospital. This information is not intended to replace advice given to you by your health care provider. Make sure you discuss any questions you have with your health care provider. Document Released: 01/17/2002 Document Revised: 09/23/2015 Document Reviewed: 11/06/2014 Elsevier Interactive Patient Education  [employer].

## 2017-03-06 LAB — CBC WITH DIFFERENTIAL/PLATELET
Basophils Absolute: 0 10*3/uL (ref 0.0–0.2)
Basos: 0 %
EOS (ABSOLUTE): 0.1 10*3/uL (ref 0.0–0.4)
Eos: 1 %
Hematocrit: 41.5 % (ref 34.0–46.6)
Hemoglobin: 13.9 g/dL (ref 11.1–15.9)
Immature Grans (Abs): 0 10*3/uL (ref 0.0–0.1)
Immature Granulocytes: 0 %
Lymphocytes Absolute: 3 10*3/uL (ref 0.7–3.1)
Lymphs: 22 %
MCH: 27.4 pg (ref 26.6–33.0)
MCHC: 33.5 g/dL (ref 31.5–35.7)
MCV: 82 fL (ref 79–97)
Monocytes Absolute: 1 10*3/uL — ABNORMAL HIGH (ref 0.1–0.9)
Monocytes: 7 %
Neutrophils Absolute: 9.8 10*3/uL — ABNORMAL HIGH (ref 1.4–7.0)
Neutrophils: 70 %
Platelets: 417 10*3/uL — ABNORMAL HIGH (ref 150–379)
RBC: 5.07 x10E6/uL (ref 3.77–5.28)
RDW: 15.3 % (ref 12.3–15.4)
WBC: 13.9 10*3/uL — ABNORMAL HIGH (ref 3.4–10.8)

## 2017-03-06 LAB — COMPREHENSIVE METABOLIC PANEL
ALT: 68 IU/L — ABNORMAL HIGH (ref 0–32)
AST: 43 IU/L — ABNORMAL HIGH (ref 0–40)
Albumin/Globulin Ratio: 1.5 (ref 1.2–2.2)
Albumin: 4.6 g/dL (ref 3.5–5.5)
Alkaline Phosphatase: 139 IU/L — ABNORMAL HIGH (ref 39–117)
BUN/Creatinine Ratio: 14 (ref 9–23)
BUN: 10 mg/dL (ref 6–24)
Bilirubin Total: 0.6 mg/dL (ref 0.0–1.2)
CO2: 28 mmol/L (ref 20–29)
Calcium: 9.9 mg/dL (ref 8.7–10.2)
Chloride: 101 mmol/L (ref 96–106)
Creatinine, Ser: 0.74 mg/dL (ref 0.57–1.00)
GFR calc Af Amer: 114 mL/min/{1.73_m2} (ref >59)
GFR calc non Af Amer: 99 mL/min/{1.73_m2} (ref >59)
Globulin, Total: 3.1 g/dL (ref 1.5–4.5)
Glucose: 96 mg/dL (ref 65–99)
Potassium: 3.6 mmol/L (ref 3.5–5.2)
Sodium: 145 mmol/L — ABNORMAL HIGH (ref 134–144)
Total Protein: 7.7 g/dL (ref 6.0–8.5)

## 2017-03-06 LAB — LIPID PANEL
Chol/HDL Ratio: 4.6 ratio — ABNORMAL HIGH (ref 0.0–4.4)
Cholesterol, Total: 241 mg/dL — ABNORMAL HIGH (ref 100–199)
HDL: 52 mg/dL (ref >39)
LDL Calculated: 168 mg/dL — ABNORMAL HIGH (ref 0–99)
Triglycerides: 107 mg/dL (ref 0–149)
VLDL Cholesterol Cal: 21 mg/dL (ref 5–40)

## 2017-03-06 LAB — VITAMIN B12: Vitamin B-12: 238 pg/mL (ref 232–1245)

## 2017-03-06 LAB — HEMOGLOBIN A1C
Est. average glucose Bld gHb Est-mCnc: 123 mg/dL
Hgb A1c MFr Bld: 5.9 % — ABNORMAL HIGH (ref 4.8–5.6)

## 2017-03-06 LAB — TSH: TSH: 2.19 u[IU]/mL (ref 0.450–4.500)

## 2017-03-08 DIAGNOSIS — F5101 Primary insomnia: Secondary | ICD-10-CM | POA: Insufficient documentation

## 2017-03-08 DIAGNOSIS — R7401 Elevation of levels of liver transaminase levels: Secondary | ICD-10-CM | POA: Insufficient documentation

## 2017-03-08 DIAGNOSIS — R740 Nonspecific elevation of levels of transaminase and lactic acid dehydrogenase [LDH]: Secondary | ICD-10-CM

## 2017-03-08 DIAGNOSIS — E538 Deficiency of other specified B group vitamins: Secondary | ICD-10-CM | POA: Insufficient documentation

## 2017-03-08 MED ORDER — OLMESARTAN MEDOXOMIL-HCTZ 20-12.5 MG PO TABS: 1.0000 | ORAL_TABLET | Freq: Every day | ORAL | 3 refills | Status: DC

## 2017-03-12 ENCOUNTER — Telehealth: Payer: Self-pay | Admitting: Family Medicine

## 2017-03-12 NOTE — Telephone Encounter (Signed)
[facility] stated that she has to first try the "alternative medications [which] include zolpidem (Ambien), eszopiclone(Lunesta) , zaleplon (Sonata), etc" which are all "sedative-hypnotics" similar to Ambien - just that Lunesta lasts twice as long as Ambien and Sonata lasts ~1/3 as long as Ambien. Would she like to try one of these meds instead?

## 2017-03-17 ENCOUNTER — Telehealth: Payer: Self-pay | Admitting: Family Medicine

## 2017-03-17 NOTE — Telephone Encounter (Signed)
Tried to get pt's MR CERVICAL SPINE WO CONTRAST procedure approved with insurance but they denied the claim a peer to peer can be done by calling [facility] at [phone].

## 2017-03-19 NOTE — Telephone Encounter (Signed)
C-spine MRI approved - Confirmation #[id number] valid 03/17/17-04/15/17

## 2017-03-23 ENCOUNTER — Ambulatory Visit
Admission: RE | Admit: 2017-03-23 | Discharge: 2017-03-23 | Disposition: A | Discharge status: Home / Self Care | Payer: 59 | Source: Ambulatory Visit | Attending: Family Medicine | Admitting: Family Medicine

## 2017-03-23 DIAGNOSIS — G6289 Other specified polyneuropathies: Secondary | ICD-10-CM

## 2017-03-23 DIAGNOSIS — M5412 Radiculopathy, cervical region: Secondary | ICD-10-CM

## 2017-03-23 DIAGNOSIS — M50122 Cervical disc disorder at C5-C6 level with radiculopathy: Secondary | ICD-10-CM | POA: Diagnosis not present

## 2017-03-26 ENCOUNTER — Ambulatory Visit (INDEPENDENT_AMBULATORY_CARE_PROVIDER_SITE_OTHER): Payer: BLUE CROSS/BLUE SHIELD | Admitting: Obstetrics & Gynecology

## 2017-03-26 ENCOUNTER — Other Ambulatory Visit: Payer: Self-pay | Admitting: Obstetrics & Gynecology

## 2017-03-26 ENCOUNTER — Ambulatory Visit (INDEPENDENT_AMBULATORY_CARE_PROVIDER_SITE_OTHER): Payer: BLUE CROSS/BLUE SHIELD

## 2017-03-26 DIAGNOSIS — T8332XD Displacement of intrauterine contraceptive device, subsequent encounter: Secondary | ICD-10-CM

## 2017-03-26 DIAGNOSIS — R1900 Intra-abdominal and pelvic swelling, mass and lump, unspecified site: Secondary | ICD-10-CM

## 2017-03-26 DIAGNOSIS — N9489 Other specified conditions associated with female genital organs and menstrual cycle: Secondary | ICD-10-CM

## 2017-03-26 DIAGNOSIS — Z6841 Body Mass Index (BMI) 40.0 and over, adult: Secondary | ICD-10-CM

## 2017-03-26 DIAGNOSIS — D219 Benign neoplasm of connective and other soft tissue, unspecified: Secondary | ICD-10-CM | POA: Diagnosis not present

## 2017-03-26 DIAGNOSIS — N949 Unspecified condition associated with female genital organs and menstrual cycle: Secondary | ICD-10-CM

## 2017-03-26 DIAGNOSIS — N979 Female infertility, unspecified: Secondary | ICD-10-CM

## 2017-03-26 DIAGNOSIS — T8332XA Displacement of intrauterine contraceptive device, initial encounter: Secondary | ICD-10-CM

## 2017-03-26 NOTE — Progress Notes (Signed)
    Mandeep Ferch 1970/11/29 [id number]        47 y.o.  G0P0 Single  RP: IUD strings lost/Fibroids/Rt Adnexal Cyst  HPI: No abdo-pelvic pain.  Starting [facility] program at [facility].   OB History  Gravida Para Term Preterm AB Living  0            SAB TAB Ectopic Multiple Live Births                   Past medical history,surgical history, problem list, medications, allergies, family history and social history were all reviewed and documented in the EPIC chart.   Directed ROS with pertinent positives and negatives documented in the history of present illness/assessment and plan.  Exam:  There were no vitals filed for this visit. General appearance:  Normal  Pelvic US today: T/V and T/A images.  Anteverted uterus measuring 9.86 x 5.77 x 4.91 cm.  Intramural fibroids sampling with largest 2 myomas measuring 2.0 x 1.9 cm and 2.7 x 1.9 cm.  IUD seen in the intrauterine cavity.  Right ovary normal.  Left ovary normal.  Continued presence of right midline cystic lesion with numerous septations measuring 10.9 x 6.7 x 8.0 cm.  Negative color flow Doppler on that area.  Cystic lesion is relatively stable compared to last ultrasound January 2018 when it was measured at 8.5 cm.  No free fluid in the posterior cul-de-sac.  Suboptimal images due to obesity.   Assessment/Plan:  47 y.o. G0P0   1. Intrauterine contraceptive device threads lost, subsequent encounter IUD in good intrauterine position per pelvic ultrasound today.  Patient reassured.  2. Fibroids Small intramural fibroids smaller than a year ago on pelvic ultrasound.  3. Adnexal cyst Right midline cystic lesion previously investigated by ultrasound and CT scan and attributed to probable postop adhesions.  Relatively stable on pelvic ultrasound today and asymptomatic.  Decision to observe.  4. Infertility, female Single 47 year old G0 thinking of the possibility of conception with sperm donor.  High risk of  infertility at this point in her life reviewed with patient.  Decision to check an AMH level today.  Patient will contact me if desires a referral to a fertility specialist. - Anti mullerian hormone; Future  Counseling on above issues more than 50% for 25 minutes.  Princess Bruins MD, 5:03 PM 03/26/2017

## 2017-03-27 ENCOUNTER — Telehealth: Payer: Self-pay | Admitting: Family Medicine

## 2017-03-27 DIAGNOSIS — M9979 Connective tissue and disc stenosis of intervertebral foramina of abdomen and other regions: Secondary | ICD-10-CM

## 2017-03-27 DIAGNOSIS — G542 Cervical root disorders, not elsewhere classified: Secondary | ICD-10-CM

## 2017-03-27 DIAGNOSIS — M779 Enthesopathy, unspecified: Secondary | ICD-10-CM

## 2017-03-27 DIAGNOSIS — M519 Unspecified thoracic, thoracolumbar and lumbosacral intervertebral disc disorder: Secondary | ICD-10-CM

## 2017-03-27 DIAGNOSIS — M9989 Other biomechanical lesions of abdomen and other regions: Secondary | ICD-10-CM

## 2017-03-27 DIAGNOSIS — M50122 Cervical disc disorder at C5-C6 level with radiculopathy: Secondary | ICD-10-CM

## 2017-03-27 DIAGNOSIS — M5412 Radiculopathy, cervical region: Secondary | ICD-10-CM

## 2017-03-27 DIAGNOSIS — M5020 Other cervical disc displacement, unspecified cervical region: Secondary | ICD-10-CM

## 2017-03-27 DIAGNOSIS — M5010 Cervical disc disorder with radiculopathy, unspecified cervical region: Secondary | ICD-10-CM

## 2017-03-27 NOTE — Telephone Encounter (Signed)
Dr Brigitte Pulse, would you like patient to come in for an office visit?

## 2017-03-27 NOTE — Telephone Encounter (Signed)
Copied from [place] [id number]. Topic: Quick Communication - See Telephone Encounter >> Mar 27, 2017  2:04 PM Ether Griffins B wrote: CRM for notification. See Telephone encounter for:  Pt has received the results of her MRI for the pinched nerve. She is still in a lot of pain and it is interfering with her work and school. She is wanting to know the next steps that need to be taken.  03/27/17.Marland Kitchen

## 2017-03-28 ENCOUNTER — Encounter: Payer: Self-pay | Admitting: Obstetrics & Gynecology

## 2017-03-28 NOTE — Patient Instructions (Signed)
1. Intrauterine contraceptive device threads lost, subsequent encounter IUD in good intrauterine position per pelvic ultrasound today.  Patient reassured.  2. Fibroids Small intramural fibroids smaller than a year ago on pelvic ultrasound.  3. Adnexal cyst Right midline cystic lesion previously investigated by ultrasound and CT scan and attributed to probable postop adhesions.  Relatively stable on pelvic ultrasound today and asymptomatic.  Decision to observe.  4. Infertility, female Single 47 year old G0 thinking of the possibility of conception with sperm donor.  High risk of infertility at this point in her life reviewed with patient.  Decision to check an AMH level today.  Patient will contact me if desires a referral to a fertility specialist. - Anti mullerian hormone; Future  [place], good seeing you today!  I will inform you of your results as soon as they are available.

## 2017-03-30 ENCOUNTER — Institutional Professional Consult (permissible substitution): Payer: BLUE CROSS/BLUE SHIELD | Admitting: Neurology

## 2017-03-30 ENCOUNTER — Telehealth: Payer: Self-pay | Admitting: Neurology

## 2017-03-30 NOTE — Telephone Encounter (Signed)
Pt no show to apt today.

## 2017-03-31 NOTE — Telephone Encounter (Signed)
See previous note about Prednisone.

## 2017-03-31 NOTE — Telephone Encounter (Signed)
Will refer to neurosurgery for further eval since she has already done PT.  Does she want to try another course of prednisone like we did in Nov to get her to that appointment?    C-spine MRI 03/23/2017 1. C5-6 right foraminal protrusion with right C6 impingement. An uncovertebral spur contributes to the stenosis based on November 2018 radiography. 2. C6-7 left foraminal protrusion impinging on the left C7 root.

## 2017-03-31 NOTE — Telephone Encounter (Signed)
Unable to leave message voice mail full.

## 2017-04-01 ENCOUNTER — Ambulatory Visit: Payer: Self-pay | Admitting: Family Medicine

## 2017-04-01 NOTE — Telephone Encounter (Signed)
Pt spoke to office 2/19.  Advised that neurosurg referral just entered.  Office will reach out to pt when add'l info is available.

## 2017-04-02 ENCOUNTER — Institutional Professional Consult (permissible substitution): Payer: BLUE CROSS/BLUE SHIELD | Admitting: Neurology

## 2017-04-23 ENCOUNTER — Encounter (INDEPENDENT_AMBULATORY_CARE_PROVIDER_SITE_OTHER): Payer: BLUE CROSS/BLUE SHIELD

## 2017-04-27 ENCOUNTER — Ambulatory Visit (INDEPENDENT_AMBULATORY_CARE_PROVIDER_SITE_OTHER): Payer: BLUE CROSS/BLUE SHIELD | Admitting: Family Medicine

## 2017-04-27 ENCOUNTER — Encounter (INDEPENDENT_AMBULATORY_CARE_PROVIDER_SITE_OTHER): Payer: Self-pay

## 2017-05-04 ENCOUNTER — Institutional Professional Consult (permissible substitution): Payer: BLUE CROSS/BLUE SHIELD | Admitting: Neurology

## 2017-05-04 ENCOUNTER — Telehealth: Payer: Self-pay | Admitting: Neurology

## 2017-05-04 NOTE — Telephone Encounter (Signed)
Pt called this am stating that she is unable to get off work and make it to apt, this is 2nd no show for the pt

## 2017-05-04 NOTE — Telephone Encounter (Signed)
Please schedule her for July - not earlier - if again no show, will dismiss .

## 2017-05-27 DIAGNOSIS — G5603 Carpal tunnel syndrome, bilateral upper limbs: Secondary | ICD-10-CM | POA: Diagnosis not present

## 2017-05-27 DIAGNOSIS — I10 Essential (primary) hypertension: Secondary | ICD-10-CM | POA: Diagnosis not present

## 2017-05-27 DIAGNOSIS — M542 Cervicalgia: Secondary | ICD-10-CM | POA: Diagnosis not present

## 2017-05-27 DIAGNOSIS — M5412 Radiculopathy, cervical region: Secondary | ICD-10-CM | POA: Diagnosis not present

## 2017-06-18 ENCOUNTER — Other Ambulatory Visit: Payer: Self-pay | Admitting: Obstetrics & Gynecology

## 2017-06-18 DIAGNOSIS — Z1231 Encounter for screening mammogram for malignant neoplasm of breast: Secondary | ICD-10-CM

## 2017-06-19 ENCOUNTER — Ambulatory Visit
Admission: RE | Admit: 2017-06-19 | Discharge: 2017-06-19 | Disposition: A | Discharge status: Home / Self Care | Payer: 59 | Source: Ambulatory Visit | Attending: Obstetrics & Gynecology | Admitting: Obstetrics & Gynecology

## 2017-06-19 DIAGNOSIS — Z1231 Encounter for screening mammogram for malignant neoplasm of breast: Secondary | ICD-10-CM

## 2017-07-31 ENCOUNTER — Telehealth: Payer: Self-pay | Admitting: Family Medicine

## 2017-07-31 NOTE — Telephone Encounter (Signed)
LOV  03/05/17 Dr. Brigitte Pulse Last refill 10/03/16  # 30  With 2 refills

## 2017-07-31 NOTE — Telephone Encounter (Signed)
Copied from [place] [id number]. Topic: Quick Communication - Rx Refill/Question >> Jul 31, 2017  9:11 AM Antonieta Iba C wrote: Medication: olmesartan-hydrochlorothiazide (BENICAR HCT) 20-12.5 MG tablet and also cyclobenzaprine (FLEXERIL) 10 MG tablet  Has the patient contacted their pharmacy? Yes.  (Agent: If no, request that the patient contact the pharmacy for the refill.) (Agent: If yes, when and what did the pharmacy advise?)  Preferred Pharmacy (with phone number or street name): [facility] [place], [place] AT [employer] [address] [place] [phone] (Phone) [phone] (Fax)      Agent: Please be advised that RX refills may take up to 3 business days. We ask that you follow-up with your pharmacy.

## 2017-08-03 ENCOUNTER — Other Ambulatory Visit: Payer: Self-pay | Admitting: Family Medicine

## 2017-08-03 NOTE — Telephone Encounter (Signed)
°  Relation to pt: self  Call back [place]: [facility] [address], [place] AT [place] [phone] (Phone) [phone] (Fax)     Reason for call:  Patient checking on the status of medication request, patient going out of town tomorrow and would like Rx sent in today, informed patient please allow 72 hour turn around time, please advise

## 2017-08-04 NOTE — Telephone Encounter (Signed)
LOV  03/05/17 Dr. Brigitte Pulse Last refill Flexeril  10/03/16  # 30 with 2 refills Last refill Benicar 03/08/17 # 90 with 3 refills

## 2017-08-04 NOTE — Telephone Encounter (Signed)
Pt is going out of town tonight

## 2017-08-11 ENCOUNTER — Encounter: Payer: Self-pay | Admitting: Family Medicine

## 2017-08-12 ENCOUNTER — Other Ambulatory Visit: Payer: Self-pay

## 2017-08-27 ENCOUNTER — Encounter (INDEPENDENT_AMBULATORY_CARE_PROVIDER_SITE_OTHER): Payer: BLUE CROSS/BLUE SHIELD

## 2017-09-03 ENCOUNTER — Ambulatory Visit: Payer: BLUE CROSS/BLUE SHIELD | Admitting: Family Medicine

## 2017-09-07 ENCOUNTER — Encounter (INDEPENDENT_AMBULATORY_CARE_PROVIDER_SITE_OTHER): Payer: Self-pay | Admitting: Family Medicine

## 2017-09-07 ENCOUNTER — Ambulatory Visit (INDEPENDENT_AMBULATORY_CARE_PROVIDER_SITE_OTHER): Payer: BLUE CROSS/BLUE SHIELD | Admitting: Family Medicine

## 2017-09-07 VITALS — BP 137/83 | HR 82 | Temp 98.1°F | Ht 62.0 in | Wt 280.0 lb

## 2017-09-07 DIAGNOSIS — R0602 Shortness of breath: Secondary | ICD-10-CM

## 2017-09-07 DIAGNOSIS — E782 Mixed hyperlipidemia: Secondary | ICD-10-CM | POA: Insufficient documentation

## 2017-09-07 DIAGNOSIS — R7303 Prediabetes: Secondary | ICD-10-CM

## 2017-09-07 DIAGNOSIS — Z1331 Encounter for screening for depression: Secondary | ICD-10-CM

## 2017-09-07 DIAGNOSIS — Z9189 Other specified personal risk factors, not elsewhere classified: Secondary | ICD-10-CM | POA: Diagnosis not present

## 2017-09-07 DIAGNOSIS — E559 Vitamin D deficiency, unspecified: Secondary | ICD-10-CM

## 2017-09-07 DIAGNOSIS — K760 Fatty (change of) liver, not elsewhere classified: Secondary | ICD-10-CM

## 2017-09-07 DIAGNOSIS — Z6841 Body Mass Index (BMI) 40.0 and over, adult: Secondary | ICD-10-CM

## 2017-09-07 DIAGNOSIS — R5383 Other fatigue: Secondary | ICD-10-CM

## 2017-09-07 DIAGNOSIS — Z0289 Encounter for other administrative examinations: Secondary | ICD-10-CM

## 2017-09-07 NOTE — Telephone Encounter (Signed)
Patient states that [facility] contacted her and told her their fax machine has been down and they never did receive the medications. They asked her if she could call Dr Brigitte Pulse and see if she could pick up a paper script or they both could be called in for cyclobenzaprine (FLEXERIL) 10 MG tablet and olmesartan-hydrochlorothiazide (BENICAR HCT) 20-12.5 MG tablet. Please advise.    [facility] @ [address]

## 2017-09-08 LAB — CBC WITH DIFFERENTIAL
Basophils Absolute: 0 10*3/uL (ref 0.0–0.2)
Basos: 0 %
EOS (ABSOLUTE): 0.1 10*3/uL (ref 0.0–0.4)
Eos: 1 %
Hematocrit: 39.9 % (ref 34.0–46.6)
Hemoglobin: 13.1 g/dL (ref 11.1–15.9)
Immature Grans (Abs): 0 10*3/uL (ref 0.0–0.1)
Immature Granulocytes: 0 %
Lymphocytes Absolute: 2.6 10*3/uL (ref 0.7–3.1)
Lymphs: 22 %
MCH: 27.1 pg (ref 26.6–33.0)
MCHC: 32.8 g/dL (ref 31.5–35.7)
MCV: 82 fL (ref 79–97)
Monocytes Absolute: 0.9 10*3/uL (ref 0.1–0.9)
Monocytes: 7 %
Neutrophils Absolute: 7.9 10*3/uL — ABNORMAL HIGH (ref 1.4–7.0)
Neutrophils: 70 %
RBC: 4.84 x10E6/uL (ref 3.77–5.28)
RDW: 15.6 % — ABNORMAL HIGH (ref 12.3–15.4)
WBC: 11.5 10*3/uL — ABNORMAL HIGH (ref 3.4–10.8)

## 2017-09-08 LAB — VITAMIN D 25 HYDROXY (VIT D DEFICIENCY, FRACTURES): Vit D, 25-Hydroxy: 5.7 ng/mL — ABNORMAL LOW (ref 30.0–100.0)

## 2017-09-08 LAB — COMPREHENSIVE METABOLIC PANEL
ALT: 44 IU/L — ABNORMAL HIGH (ref 0–32)
AST: 28 IU/L (ref 0–40)
Albumin/Globulin Ratio: 1.4 (ref 1.2–2.2)
Albumin: 4.1 g/dL (ref 3.5–5.5)
Alkaline Phosphatase: 127 IU/L — ABNORMAL HIGH (ref 39–117)
BUN/Creatinine Ratio: 14 (ref 9–23)
BUN: 10 mg/dL (ref 6–24)
Bilirubin Total: 0.4 mg/dL (ref 0.0–1.2)
CO2: 27 mmol/L (ref 20–29)
Calcium: 9.7 mg/dL (ref 8.7–10.2)
Chloride: 101 mmol/L (ref 96–106)
Creatinine, Ser: 0.73 mg/dL (ref 0.57–1.00)
GFR calc Af Amer: 115 mL/min/{1.73_m2} (ref >59)
GFR calc non Af Amer: 100 mL/min/{1.73_m2} (ref >59)
Globulin, Total: 2.9 g/dL (ref 1.5–4.5)
Glucose: 96 mg/dL (ref 65–99)
Potassium: 4 mmol/L (ref 3.5–5.2)
Sodium: 143 mmol/L (ref 134–144)
Total Protein: 7 g/dL (ref 6.0–8.5)

## 2017-09-08 LAB — HEMOGLOBIN A1C
Est. average glucose Bld gHb Est-mCnc: 126 mg/dL
Hgb A1c MFr Bld: 6 % — ABNORMAL HIGH (ref 4.8–5.6)

## 2017-09-08 LAB — LIPID PANEL WITH LDL/HDL RATIO
Cholesterol, Total: 235 mg/dL — ABNORMAL HIGH (ref 100–199)
HDL: 45 mg/dL (ref >39)
LDL Calculated: 143 mg/dL — ABNORMAL HIGH (ref 0–99)
LDl/HDL Ratio: 3.2 ratio (ref 0.0–3.2)
Triglycerides: 233 mg/dL — ABNORMAL HIGH (ref 0–149)
VLDL Cholesterol Cal: 47 mg/dL — ABNORMAL HIGH (ref 5–40)

## 2017-09-08 LAB — VITAMIN B12: Vitamin B-12: 989 pg/mL (ref 232–1245)

## 2017-09-08 LAB — T4, FREE: Free T4: 1.2 ng/dL (ref 0.82–1.77)

## 2017-09-08 LAB — TSH: TSH: 3.19 u[IU]/mL (ref 0.450–4.500)

## 2017-09-08 LAB — INSULIN, RANDOM: INSULIN: 14.8 u[IU]/mL (ref 2.6–24.9)

## 2017-09-08 LAB — FOLATE: Folate: 3.4 ng/mL (ref >3.0)

## 2017-09-08 LAB — T3: T3, Total: 129 ng/dL (ref 71–180)

## 2017-09-08 NOTE — Progress Notes (Signed)
Office: [phone]  /  Fax: [phone]   Dear Dr. Brigitte Collins,   Thank you for referring Mindy Collins to our clinic. The following note includes my evaluation and treatment recommendations.  HPI:   Chief Complaint: OBESITY    Mindy Collins has been referred by Laurey Arrow. Mindy Pulse, MD for consultation regarding her obesity and obesity related comorbidities.    Mindy Collins (MR# [id number]) is a 47 y.o. female who presents on 09/08/2017 for obesity evaluation and treatment. Current BMI is Body mass index is 51.21 kg/m.Mindy Collins has been struggling with her weight for many years and has been unsuccessful in either losing weight, maintaining weight loss, or reaching her healthy weight goal.     Mindy Collins attended our information session and states she is currently in the action stage of change and ready to dedicate time achieving and maintaining a healthier weight. Mindy Collins is interested in becoming our patient and working on intensive lifestyle modifications including (but not limited to) diet, exercise and weight loss.    Mindy Collins states her family eats meals together she started gaining weight when she stopped being active her heaviest weight ever was 285 lbs. she is a picky eater and doesn't like to eat healthier foods  she has significant food cravings issues  she snacks frequently in the evenings she wakes up frequently in the middle of the night to eat she skips meals frequently she is frequently drinking liquids with calories she frequently makes poor food choices she frequently eats larger portions than normal  she has binge eating behaviors she struggles with emotional eating    Fatigue Mindy Collins feels her energy is lower than it should be. This has worsened with weight gain and has not worsened recently. Mindy Collins admits to daytime somnolence and admits to waking up still tired. Patient is at risk for obstructive sleep apnea. Patent has a history of symptoms of daytime fatigue and morning fatigue. Patient  generally gets 4 hours of sleep per night, and states they generally have restless sleep. Snoring is present. Apneic episodes are present. Epworth Sleepiness Score is 8  Dyspnea on exertion Mindy Collins notes increasing shortness of breath with exercising and seems to be worsening over time with weight gain. She notes getting out of breath sooner with activity than she used to. This has not gotten worse recently. Mindy Collins denies orthopnea.  Vitamin D deficiency Mindy Collins has a diagnosis of vitamin D deficiency. She is not currently taking vit D. There are no recent labs. Mindy Collins admits fatigue but denies nausea, vomiting or muscle weakness.  Hyperlipidemia Mixed Mindy Collins has mixed hyperlipidemia and is not on statin. She is attempting to control her cholesterol levels with intensive lifestyle modification including a low saturated fat diet, exercise and weight loss. She denies any chest pain, claudication or myalgias.  At risk for cardiovascular disease Mindy Collins is at a higher than average risk for cardiovascular disease due to obesity and hyperlipidemia. She currently denies any chest pain.  Pre-Diabetes Mindy Collins has a diagnosis of prediabetes based on her elevated Hgb A1c and was informed this puts her at greater risk of developing diabetes. Mindy Collins has a positive family history of diabetes. She is not taking metformin currently. She is attempting to work on diet and exercise to decrease risk of diabetes. She admits polyphagia and denies nausea or hypoglycemia.  NAFLD (non alcoholic fatty liver disease) Mindy Collins has a diagnosis of non alcoholic fatty liver disease with mildly elevated LFT's in the past. Her BMI is over 40. She denies abdominal pain  or jaundice. She denies excessive alcohol intake.  Depression Screen Mindy Collins's Food and Mood (modified PHQ-9) score was  Depression screen PHQ 2/9 09/07/2017  Decreased Interest 3  Down, Depressed, Hopeless 3  PHQ - 2 Score 6  Altered sleeping 3  Tired, decreased energy 3    Change in appetite 3  Feeling bad or failure about yourself  3  Trouble concentrating 3  Moving slowly or fidgety/restless 2  Suicidal thoughts 1  PHQ-9 Score 24  Difficult doing work/chores Somewhat difficult    ALLERGIES: Allergies  Allergen Reactions  . Esomeprazole Magnesium Shortness Of Breath    Nervousness; tolerates Aciphex.  . Adhesive [Tape] Itching and Rash    Itching and rash with adhesive tape  . Latex Itching and Dermatitis    Skin blistering  . Levocetirizine Dihydrochloride Other (See Comments)    Muscle jerks  . Nexium [Esomeprazole]     MEDICATIONS: Current Outpatient Medications on File Prior to Visit  Medication Sig Dispense Refill  . cyclobenzaprine (FLEXERIL) 10 MG tablet TAKE 1 TABLET BY MOUTH THREE TIMES A DAY IF NEEDED FOR MUSCLE SPASM 30 tablet 0  . fluticasone (FLONASE) 50 MCG/ACT nasal spray instill 1 spray into each nostril twice a day  0  . levonorgestrel (MIRENA) 20 MCG/24HR IUD 1 each by Intrauterine route once.    . loratadine (CLARITIN) 10 MG tablet Take 10 mg by mouth daily.    . meloxicam (MOBIC) 15 MG tablet take 1 tablet by mouth daily if needed  0  . olmesartan-hydrochlorothiazide (BENICAR HCT) 20-12.5 MG tablet TAKE 1 TABLET BY MOUTH EVERY DAY 90 tablet 0  . vitamin B-12 (CYANOCOBALAMIN) 1000 MCG tablet Take 1,000 mcg by mouth daily.     No current facility-administered medications on file prior to visit.     PAST MEDICAL HISTORY: Past Medical History:  Diagnosis Date  . Abdominal wall seroma   . Anemia   . Arthritis   . Asthma   . B12 deficiency   . Back pain   . Carpal tunnel syndrome, bilateral   . Chest pain   . Colitis   . Constipation   . Dysmenorrhea   . Dyspnea   . Fatty liver   . GERD (gastroesophageal reflux disease)   . HLD (hyperlipidemia)   . HPV (human papilloma virus) infection    WARTS/ TCA TX  . Hypertension   . Keratoconus   . Leg edema   . Obesity   . Obesity   . Osteoarthritis   . Palpitations    . Prediabetes   . Seasonal allergies     PAST SURGICAL HISTORY: Past Surgical History:  Procedure Laterality Date  . GYNECOLOGIC CRYOSURGERY  1993   DYSPLASIA CERVIX/   . MYOMECTOMY  02/17/2011   Procedure: MYOMECTOMY;  Surgeon: Terrance Mass, MD;  Location: [facility];  Service: Gynecology;  Laterality: N/A;  I am hoping to get a 7:30am time on Dec 4, Dec 11 or Dec 12. It not available I will check on some 1:00 pm times. Thanks  . MYOMECTOMY ABDOMINAL APPROACH  04/31/2007  . OVARIAN CYST REMOVAL  02/17/2011   Procedure: OVARIAN CYSTECTOMY;  Surgeon: Terrance Mass, MD;  Location: [facility];  Service: Gynecology;  Laterality: Left;    SOCIAL HISTORY: Social History   Tobacco Use  . Smoking status: Never Smoker  . Smokeless tobacco: Never Used  . Tobacco comment: denies tobacco   Substance Use Topics  . Alcohol use: No    Alcohol/week: 0.0  oz  . Drug use: No    FAMILY HISTORY: Family History  Problem Relation Age of Onset  . Hypertension Mother   . Breast cancer Mother   . Cancer Mother   . Hyperlipidemia Mother   . Thyroid disease Mother   . Obesity Mother   . Heart disease Maternal Grandmother   . Cancer Father        LUNG  . Breast cancer Maternal Aunt   . Diabetes Maternal Grandfather   . Cancer Paternal Aunt   . Ovarian cancer Paternal Aunt     ROS: Review of Systems  Constitutional: Positive for malaise/fatigue.  HENT: Positive for congestion (nasal stuffiness).   Eyes:       Vision Changes Wear Glasses or Contacts Blurry or Double Vision  Respiratory: Positive for shortness of breath (on exertion).   Cardiovascular: Negative for chest pain, orthopnea and claudication.       Leg Cramping  Gastrointestinal: Positive for constipation. Negative for nausea and vomiting.  Musculoskeletal: Positive for back pain. Negative for myalgias.       Muscle or Joint Pain Negative for muscle weakness  Skin: Positive for itching.  Endo/Heme/Allergies:       Positive  for polyphagia Negative for hypoglycemia  Psychiatric/Behavioral: The patient has insomnia.        Stress    PHYSICAL EXAM: Blood pressure 137/83, Collins 82, temperature 98.1 F (36.7 C), temperature source Oral, height 5\' 2"  (1.575 m), weight 280 lb (127 kg), SpO2 99 %. Body mass index is 51.21 kg/m. Physical Exam  Constitutional: She is oriented to person, place, and time. She appears well-developed and well-nourished.  HENT:  Head: Normocephalic and atraumatic.  Nose: Nose normal.  Eyes: EOM are normal. No scleral icterus.  Neck: Normal range of motion. Neck supple. No thyromegaly present.  Cardiovascular: Normal rate and regular rhythm.  Pulmonary/Chest: Effort normal. No respiratory distress.  Abdominal: Soft. There is no tenderness.  +obesity  Musculoskeletal: Normal range of motion.  Range of Motion normal in all 4 extremities  Neurological: She is alert and oriented to person, place, and time. Coordination normal.  Skin: Skin is warm and dry.  Psychiatric: She has a normal mood and affect. Her behavior is normal.  Vitals reviewed.   RECENT LABS AND TESTS: BMET    Component Value Date/Time   NA 143 09/07/2017 0917   K 4.0 09/07/2017 0917   CL 101 09/07/2017 0917   CO2 27 09/07/2017 0917   GLUCOSE 96 09/07/2017 0917   GLUCOSE 99 02/22/2016 1206   BUN 10 09/07/2017 0917   CREATININE 0.73 09/07/2017 0917   CREATININE 0.75 02/22/2016 1206   CALCIUM 9.7 09/07/2017 0917   GFRNONAA 100 09/07/2017 0917   GFRAA 115 09/07/2017 0917   Lab Results  Component Value Date   HGBA1C 6.0 (H) 09/07/2017   Lab Results  Component Value Date   INSULIN 14.8 09/07/2017   CBC    Component Value Date/Time   WBC 11.5 (H) 09/07/2017 0917   WBC 9.3 02/22/2016 1206   RBC 4.84 09/07/2017 0917   RBC 4.84 02/22/2016 1206   HGB 13.1 09/07/2017 0917   HGB 11.5 (L) 02/14/2011 1218   HCT 39.9 09/07/2017 0917   HCT 35.4 02/14/2011 1218   PLT 417 (H) 03/05/2017 1754   MCV 82  09/07/2017 0917   MCV 83.2 02/14/2011 1218   MCH 27.1 09/07/2017 0917   MCH 26.4 (L) 02/22/2016 1206   MCHC 32.8 09/07/2017 0917   MCHC  31.7 (L) 02/22/2016 1206   RDW 15.6 (H) 09/07/2017 0917   RDW 15.2 (H) 02/14/2011 1218   LYMPHSABS 2.6 09/07/2017 0917   LYMPHSABS 1.7 02/14/2011 1218   MONOABS 651 02/22/2016 1206   MONOABS 0.8 02/14/2011 1218   EOSABS 0.1 09/07/2017 0917   BASOSABS 0.0 09/07/2017 0917   BASOSABS 0.1 02/14/2011 1218   Iron/TIBC/Ferritin/ %Sat No results found for: IRON, TIBC, FERRITIN, IRONPCTSAT Lipid Panel     Component Value Date/Time   CHOL 235 (H) 09/07/2017 0917   TRIG 233 (H) 09/07/2017 0917   HDL 45 09/07/2017 0917   CHOLHDL 4.6 (H) 03/05/2017 1754   CHOLHDL 4.8 02/22/2016 1206   VLDL 21 02/22/2016 1206   LDLCALC 143 (H) 09/07/2017 0917   LDLDIRECT 141.6 04/16/2010 0743   Hepatic Function Panel     Component Value Date/Time   PROT 7.0 09/07/2017 0917   ALBUMIN 4.1 09/07/2017 0917   AST 28 09/07/2017 0917   ALT 44 (H) 09/07/2017 0917   ALKPHOS 127 (H) 09/07/2017 0917   BILITOT 0.4 09/07/2017 0917      Component Value Date/Time   TSH 3.190 09/07/2017 0917   TSH 2.190 03/05/2017 1754   TSH 1.72 02/22/2016 1206    ECG  shows NSR with a rate of 91 BPM INDIRECT CALORIMETER done today shows a VO2 of 301 and a REE of 2097.  Her calculated basal metabolic rate is 9833 thus her basal metabolic rate is better than expected.    ASSESSMENT AND PLAN: Other fatigue - Plan: EKG 12-Lead, Vitamin B12, CBC With Differential, Folate, T3, T4, free, TSH  Shortness of breath on exertion  Mixed hyperlipidemia - Plan: Lipid Panel With LDL/HDL Ratio  Prediabetes - Plan: Comprehensive metabolic panel, Hemoglobin A1c, Insulin, random  Vitamin D deficiency - Plan: VITAMIN D 25 Hydroxy (Vit-D Deficiency, Fractures)  NAFLD (nonalcoholic fatty liver disease) - Plan: Comprehensive metabolic panel  Depression screening  At risk for heart disease  Class 3  severe obesity with serious comorbidity and body mass index (BMI) of 50.0 to 59.9 in adult, unspecified obesity type ([place])  PLAN: Fatigue Laporsche was informed that her fatigue may be related to obesity, depression or many other causes. Labs will be ordered, and in the meanwhile Melaney has agreed to work on diet, exercise and weight loss to help with fatigue. Proper sleep hygiene was discussed including the need for 7-8 hours of quality sleep each night. A sleep study was not ordered based on symptoms and Epworth score.  Dyspnea on exertion Timaya's shortness of breath appears to be obesity related and exercise induced. She has agreed to work on weight loss and gradually increase exercise to treat her exercise induced shortness of breath. If Skila follows our instructions and loses weight without improvement of her shortness of breath, we will plan to refer to pulmonology. We will monitor this condition regularly. Jaslene agrees to this plan.  Vitamin D Deficiency Brena was informed that low vitamin D levels contributes to fatigue and are associated with obesity, breast, and colon cancer. We will check labs and she will follow up for routine testing of vitamin D, at least 2-3 times per year.    Hyperlipidemia Mixed Jeanelle was informed of the American Heart Association Guidelines emphasizing intensive lifestyle modifications as the first line treatment for hyperlipidemia. We discussed many lifestyle modifications today in depth, and Bruce will start diet and will work on decreasing saturated fats such as fatty red meat, butter and many fried foods. She will  also increase vegetables and lean protein in her diet and work on exercise and weight loss efforts. We will check labs and Shalyn will follow up at the agreed upon time.  Cardiovascular risk counseling Camiyah was given extended (15 minutes) coronary artery disease prevention counseling today. She is 47 y.o. female and has risk factors for heart disease  including obesity and hyperlipidemia. We discussed intensive lifestyle modifications today with an emphasis on specific weight loss instructions and strategies. Pt was also informed of the importance of increasing exercise and decreasing saturated fats to help prevent heart disease.  Pre-Diabetes Loisann will start diet and work on weight loss, exercise, and decreasing simple carbohydrates in her diet to help decrease the risk of diabetes. She was informed that eating too many simple carbohydrates or too many calories at one sitting increases the likelihood of GI side effects. We will check labs and Leita agreed to follow up with [place] as directed to monitor her progress.  NAFLD (non alcoholic fatty liver disease) We discussed the diagnosis of non alcoholic fatty liver disease today and how this condition is obesity related. Kerria was educated on her risk of developing NASH or even liver failure and the only proven treatment for NAFLD was weight loss. Alianah agreed to continue with her weight loss efforts with healthier diet and exercise as an essential part of her treatment plan.  Depression Screen Purva had a strongly positive depression screening. Depression is commonly associated with obesity and often results in emotional eating behaviors. We will monitor this closely and work on CBT to help improve the non-hunger eating patterns. Referral to Psychology may be required if no improvement is seen as she continues in our clinic.  Obesity Zetta is currently in the action stage of change and her goal is to continue with weight loss efforts. I recommend Shelly begin the structured treatment plan as follows:  She has agreed to follow the Category 3 plan Ikia has been instructed to eventually work up to a goal of 150 minutes of combined cardio and strengthening exercise per week for weight loss and overall health benefits. We discussed the following Behavioral Modification Strategies today: increasing lean  protein intake and decreasing simple carbohydrates    She was informed of the importance of frequent follow up visits to maximize her success with intensive lifestyle modifications for her multiple health conditions. She was informed we would discuss her lab results at her next visit unless there is a critical issue that needs to be addressed sooner. Nelline agreed to keep her next visit at the agreed upon time to discuss these results.    OBESITY BEHAVIORAL INTERVENTION VISIT  Today's visit was # 1 out of 22.  Starting weight: 280 lbs Starting date: 09/07/17 Today's weight : 280 lbs  Today's date: 09/07/2017 Total lbs lost to date: 0 (Patients must lose 7 lbs in the first 6 months to continue with counseling)   ASK: We discussed the diagnosis of obesity with Fernande Bras today and Denaja agreed to give [place] permission to discuss obesity behavioral modification therapy today.  ASSESS: Shavon has the diagnosis of obesity and her BMI today is 51.2 Chanelle is in the action stage of change   ADVISE: Abreanna was educated on the multiple health risks of obesity as well as the benefit of weight loss to improve her health. She was advised of the need for long term treatment and the importance of lifestyle modifications.  AGREE: Multiple dietary modification options and treatment options were  discussed and  Leona agreed to the above obesity treatment plan.   I, Doreene Nest, am acting as transcriptionist for  Dennard Nip, MD   I have reviewed the above documentation for accuracy and completeness, and I agree with the above. -Dennard Nip, MD

## 2017-09-10 ENCOUNTER — Telehealth: Payer: Self-pay | Admitting: Family Medicine

## 2017-09-10 NOTE — Telephone Encounter (Signed)
Call to pharmacy- they checked the computer- the last time patient filled cyclobenzaprine was January. They do not have they 08/12/17 Rx on file.  Can that be resent for patient?

## 2017-09-10 NOTE — Telephone Encounter (Signed)
pls advise

## 2017-09-10 NOTE — Telephone Encounter (Signed)
Copied from [place] [phone]. Topic: Quick Communication - Rx Refill/Question >> Sep 10, 2017  3:24 PM Synthia Innocent wrote: Medication: cyclobenzaprine (FLEXERIL) 10 MG tablet, did not receive refill from 08/12/17  Has the patient contacted their pharmacy? Yes.   (Agent: If no, request that the patient contact the pharmacy for the refill.) (Agent: If yes, when and what did the pharmacy advise?)  Preferred Pharmacy (with phone number or street name): [facility]  Agent: Please be advised that RX refills may take up to 3 business days. We ask that you follow-up with your pharmacy.  Patient requesting call once sent

## 2017-09-11 ENCOUNTER — Other Ambulatory Visit: Payer: Self-pay | Admitting: Family Medicine

## 2017-09-11 NOTE — Telephone Encounter (Signed)
cyclobenzaprine refill Last Refill:08/12/17 # 30 Last OV: cannot find OV where issue addressed in office note PCP: Dr Brigitte Pulse Pharmacy:[facility] [address]

## 2017-09-14 ENCOUNTER — Encounter: Payer: Self-pay | Admitting: Family Medicine

## 2017-09-14 MED ORDER — CYCLOBENZAPRINE HCL 10 MG PO TABS: ORAL_TABLET | ORAL | 1 refills | Status: DC

## 2017-09-14 MED ORDER — OLMESARTAN MEDOXOMIL-HCTZ 20-12.5 MG PO TABS: 1.0000 | ORAL_TABLET | Freq: Every day | ORAL | 1 refills | Status: DC

## 2017-09-14 NOTE — Telephone Encounter (Signed)
Sent by e-rx and notified pt by [facility] that they confirmed receipt

## 2017-09-14 NOTE — Telephone Encounter (Signed)
Resent - pt requested call once it was done but I did send her a MyChart message so hopefully that will suffice. . . ?  Meds ordered this encounter  Medications  . cyclobenzaprine (FLEXERIL) 10 MG tablet    Sig: TAKE 1 TABLET BY MOUTH THREE TIMES A DAY IF NEEDED FOR MUSCLE SPASM    Dispense:  30 tablet    Refill:  1  . olmesartan-hydrochlorothiazide (BENICAR HCT) 20-12.5 MG tablet    Sig: Take 1 tablet by mouth daily.    Dispense:  90 tablet    Refill:  1    Pt does not need currently  - looks like 90d supply was sent in on 7/3 - but please add these on as additional refills to her current rx. Thanks.

## 2017-09-15 ENCOUNTER — Encounter (INDEPENDENT_AMBULATORY_CARE_PROVIDER_SITE_OTHER): Payer: Self-pay | Admitting: Family Medicine

## 2017-09-16 NOTE — Telephone Encounter (Signed)
Can you plese r/s patient? Thank you very much.

## 2017-09-21 ENCOUNTER — Encounter (INDEPENDENT_AMBULATORY_CARE_PROVIDER_SITE_OTHER): Payer: Self-pay

## 2017-09-21 ENCOUNTER — Ambulatory Visit (INDEPENDENT_AMBULATORY_CARE_PROVIDER_SITE_OTHER): Payer: Self-pay | Admitting: Family Medicine

## 2017-10-01 ENCOUNTER — Ambulatory Visit (INDEPENDENT_AMBULATORY_CARE_PROVIDER_SITE_OTHER): Payer: 59 | Admitting: Family Medicine

## 2017-10-01 VITALS — BP 136/85 | HR 86 | Temp 97.9°F | Ht 62.0 in | Wt 275.0 lb

## 2017-10-01 DIAGNOSIS — E559 Vitamin D deficiency, unspecified: Secondary | ICD-10-CM

## 2017-10-01 DIAGNOSIS — R7303 Prediabetes: Secondary | ICD-10-CM | POA: Diagnosis not present

## 2017-10-01 DIAGNOSIS — Z9189 Other specified personal risk factors, not elsewhere classified: Secondary | ICD-10-CM

## 2017-10-01 MED ORDER — VITAMIN D (ERGOCALCIFEROL) 1.25 MG (50000 UNIT) PO CAPS: 50000.0000 [IU] | ORAL_CAPSULE | ORAL | 0 refills | Status: DC

## 2017-10-05 NOTE — Progress Notes (Signed)
Office: [phone]  /  Fax: [phone]   HPI:   Chief Complaint: OBESITY Mindy Collins is here to discuss her progress with her obesity treatment plan. She is on the Category 3 plan and is following her eating plan approximately 40 % of the time. She states she is exercising 0 minutes 0 times per week. [name] did well with weight loss on her Category 3 plan. She was mindful of her calories even when she was unable to follow her plan closely.  Her weight is 275 lb (124.7 kg) today and has had a weight loss of 5 pounds over a period of 3 weeks since her last visit. She has lost 5 lbs since starting treatment with [place].  Vitamin D Deficiency Mindy Collins has a new diagnosis of vitamin D deficiency. She is not currently taking Vit D, she notes fatigue and denies nausea, vomiting or muscle weakness.  Pre-Diabetes Mindy Collins has a new diagnosis of pre-diabetes based on her elevated Hgb A1c and was informed this puts her at greater risk of developing diabetes. She is not taking metformin currently and continues to work on diet and exercise to decrease risk of diabetes. She notes polyphagia and denies hypoglycemia.  At risk for diabetes Mindy Collins is at higher than average risk for developing diabetes due to her obesity and pre-diabetes. She currently denies polyuria or polydipsia.  ALLERGIES: Allergies  Allergen Reactions  . Esomeprazole Magnesium Shortness Of Breath    Nervousness; tolerates Aciphex.  . Adhesive [Tape] Itching and Rash    Itching and rash with adhesive tape  . Latex Itching and Dermatitis    Skin blistering  . Levocetirizine Dihydrochloride Other (See Comments)    Muscle jerks  . Nexium [Esomeprazole]     MEDICATIONS: Current Outpatient Medications on File Prior to Visit  Medication Sig Dispense Refill  . cyclobenzaprine (FLEXERIL) 10 MG tablet TAKE 1 TABLET BY MOUTH THREE TIMES A DAY IF NEEDED FOR MUSCLE SPASM 30 tablet 1  . fluticasone (FLONASE) 50 MCG/ACT nasal spray instill 1 spray  into each nostril twice a day  0  . levonorgestrel (MIRENA) 20 MCG/24HR IUD 1 each by Intrauterine route once.    . loratadine (CLARITIN) 10 MG tablet Take 10 mg by mouth daily.    . meloxicam (MOBIC) 15 MG tablet take 1 tablet by mouth daily if needed  0  . olmesartan-hydrochlorothiazide (BENICAR HCT) 20-12.5 MG tablet Take 1 tablet by mouth daily. 90 tablet 1  . vitamin B-12 (CYANOCOBALAMIN) 1000 MCG tablet Take 1,000 mcg by mouth daily.     No current facility-administered medications on file prior to visit.     PAST MEDICAL HISTORY: Past Medical History:  Diagnosis Date  . Abdominal wall seroma   . Anemia   . Arthritis   . Asthma   . B12 deficiency   . Back pain   . Carpal tunnel syndrome, bilateral   . Chest pain   . Colitis   . Constipation   . Dysmenorrhea   . Dyspnea   . Fatty liver   . GERD (gastroesophageal reflux disease)   . HLD (hyperlipidemia)   . HPV (human papilloma virus) infection    WARTS/ TCA [place]  . Hypertension   . Keratoconus   . Leg edema   . Obesity   . Obesity   . Osteoarthritis   . Palpitations   . Prediabetes   . Seasonal allergies     PAST SURGICAL HISTORY: Past Surgical History:  Procedure Laterality Date  . GYNECOLOGIC  CRYOSURGERY  1993   DYSPLASIA CERVIX/   . MYOMECTOMY  02/17/2011   Procedure: MYOMECTOMY;  Surgeon: Terrance Mass, MD;  Location: [facility];  Service: Gynecology;  Laterality: N/A;  I am hoping to get a 7:30am time on Dec 4, Dec 11 or Dec 12. It not available I will check on some 1:00 pm times. Thanks  . MYOMECTOMY ABDOMINAL APPROACH  04/31/2007  . OVARIAN CYST REMOVAL  02/17/2011   Procedure: OVARIAN CYSTECTOMY;  Surgeon: Terrance Mass, MD;  Location: [facility];  Service: Gynecology;  Laterality: Left;    SOCIAL HISTORY: Social History   Tobacco Use  . Smoking status: Never Smoker  . Smokeless tobacco: Never Used  . Tobacco comment: denies tobacco   Substance Use Topics  . Alcohol use: No    Alcohol/week: 0.0  standard drinks  . Drug use: No    FAMILY HISTORY: Family History  Problem Relation Age of Onset  . Hypertension Mother   . Breast cancer Mother   . Cancer Mother   . Hyperlipidemia Mother   . Thyroid disease Mother   . Obesity Mother   . Heart disease Maternal Grandmother   . Cancer Father        LUNG  . Breast cancer Maternal Aunt   . Diabetes Maternal Grandfather   . Cancer Paternal Aunt   . Ovarian cancer Paternal Aunt     ROS: Review of Systems  Constitutional: Positive for malaise/fatigue and weight loss.  Gastrointestinal: Negative for nausea and vomiting.  Genitourinary: Negative for frequency.  Musculoskeletal:       Negative muscle weakness  Endo/Heme/Allergies: Negative for polydipsia.       Positive polyphagia Negative hypoglycemia    PHYSICAL EXAM: Blood pressure 136/85, pulse 86, temperature 97.9 F (36.6 C), temperature source Oral, height 5\' 2"  (1.575 m), weight 275 lb (124.7 kg), SpO2 98 %. Body mass index is 50.3 kg/m. Physical Exam  Constitutional: She is oriented to person, place, and time. She appears well-developed and well-nourished.  Cardiovascular: Normal rate.  Pulmonary/Chest: Effort normal.  Musculoskeletal: Normal range of motion.  Neurological: She is oriented to person, place, and time.  Skin: Skin is warm and dry.  Psychiatric: She has a normal mood and affect. Her behavior is normal.  Vitals reviewed.   RECENT LABS AND TESTS: BMET    Component Value Date/Time   NA 143 09/07/2017 0917   K 4.0 09/07/2017 0917   CL 101 09/07/2017 0917   CO2 27 09/07/2017 0917   GLUCOSE 96 09/07/2017 0917   GLUCOSE 99 02/22/2016 1206   BUN 10 09/07/2017 0917   CREATININE 0.73 09/07/2017 0917   CREATININE 0.75 02/22/2016 1206   CALCIUM 9.7 09/07/2017 0917   GFRNONAA 100 09/07/2017 0917   GFRAA 115 09/07/2017 0917   Lab Results  Component Value Date   HGBA1C 6.0 (H) 09/07/2017   HGBA1C 5.9 (H) 03/05/2017   HGBA1C 6.0 (H) 09/27/2016    HGBA1C 5.7 (H) 01/21/2014   HGBA1C 5.4 12/15/2013   Lab Results  Component Value Date   INSULIN 14.8 09/07/2017   CBC    Component Value Date/Time   WBC 11.5 (H) 09/07/2017 0917   WBC 9.3 02/22/2016 1206   RBC 4.84 09/07/2017 0917   RBC 4.84 02/22/2016 1206   HGB 13.1 09/07/2017 0917   HGB 11.5 (L) 02/14/2011 1218   HCT 39.9 09/07/2017 0917   HCT 35.4 02/14/2011 1218   PLT 417 (H) 03/05/2017 1754   MCV 82 09/07/2017  0917   MCV 83.2 02/14/2011 1218   MCH 27.1 09/07/2017 0917   MCH 26.4 (L) 02/22/2016 1206   MCHC 32.8 09/07/2017 0917   MCHC 31.7 (L) 02/22/2016 1206   RDW 15.6 (H) 09/07/2017 0917   RDW 15.2 (H) 02/14/2011 1218   LYMPHSABS 2.6 09/07/2017 0917   LYMPHSABS 1.7 02/14/2011 1218   MONOABS 651 02/22/2016 1206   MONOABS 0.8 02/14/2011 1218   EOSABS 0.1 09/07/2017 0917   BASOSABS 0.0 09/07/2017 0917   BASOSABS 0.1 02/14/2011 1218   Iron/TIBC/Ferritin/ %Sat No results found for: IRON, TIBC, FERRITIN, IRONPCTSAT Lipid Panel     Component Value Date/Time   CHOL 235 (H) 09/07/2017 0917   TRIG 233 (H) 09/07/2017 0917   HDL 45 09/07/2017 0917   CHOLHDL 4.6 (H) 03/05/2017 1754   CHOLHDL 4.8 02/22/2016 1206   VLDL 21 02/22/2016 1206   LDLCALC 143 (H) 09/07/2017 0917   LDLDIRECT 141.6 04/16/2010 0743   Hepatic Function Panel     Component Value Date/Time   PROT 7.0 09/07/2017 0917   ALBUMIN 4.1 09/07/2017 0917   AST 28 09/07/2017 0917   ALT 44 (H) 09/07/2017 0917   ALKPHOS 127 (H) 09/07/2017 0917   BILITOT 0.4 09/07/2017 0917      Component Value Date/Time   TSH 3.190 09/07/2017 0917   TSH 2.190 03/05/2017 1754   TSH 1.72 02/22/2016 1206  Results for VERNISHA, BACOTE (MRN [id number]) as of 10/05/2017 10:14  Ref. Range 09/07/2017 09:17  Vitamin D, 25-Hydroxy Latest Ref Range: 30.0 - 100.0 ng/mL 5.7 (L)    ASSESSMENT AND PLAN: Vitamin D deficiency - Plan: Vitamin D, Ergocalciferol, (DRISDOL) 50000 units CAPS capsule  Prediabetes  At risk for diabetes  mellitus  PLAN:  Vitamin D Deficiency Neima was informed that low vitamin D levels contributes to fatigue and are associated with obesity, breast, and colon cancer. Lititia agrees to start prescription Vit D @50 ,000 IU every 3 days #10 with no refills. She will follow up for routine testing of vitamin D, at least 2-3 times per year. She was informed of the risk of over-replacement of vitamin D and agrees to not increase her dose unless she discusses this with [place] first. Raetta agrees to follow up with our clinic in 2 weeks.  Pre-Diabetes Ericha will continue to work on weight loss, diet, exercise, and decreasing simple carbohydrates in her diet to help decrease the risk of diabetes. We dicussed metformin including benefits and risks. She was informed that eating too many simple carbohydrates or too many calories at one sitting increases the likelihood of GI side effects. Sidnee declined metformin for now and a prescription was not written today. We will recheck labs in 3 months. Prezley agrees to follow up with our clinic in 2 weeks as directed to monitor her progress.  Diabetes risk counselling Kaho was given extended (30 minutes) diabetes prevention counseling today. She is 47 y.o. female and has risk factors for diabetes including obesity and pre-diabetes. We discussed intensive lifestyle modifications today with an emphasis on weight loss as well as increasing exercise and decreasing simple carbohydrates in her diet.  Obesity Daje is currently in the action stage of change. As such, her goal is to continue with weight loss efforts She has agreed to follow the Category 3 plan Shawna has been instructed to work up to a goal of 150 minutes of combined cardio and strengthening exercise per week for weight loss and overall health benefits. We discussed the following Behavioral Modification Strategies  today: increasing lean protein intake, decreasing simple carbohydrates, and better snacking choices     Suheyla has agreed to follow up with our clinic in 2 weeks. She was informed of the importance of frequent follow up visits to maximize her success with intensive lifestyle modifications for her multiple health conditions.   OBESITY BEHAVIORAL INTERVENTION VISIT  Today's visit was # 2.   Starting weight: 280 lbs Starting date: 09/07/17 Today's weight : 275 lbs Today's date: 10/01/2017 Total lbs lost to date: 5 At least 15 minutes were spent on discussing the following behavioral intervention visit.   ASK: We discussed the diagnosis of obesity with Fernande Bras today and Arnett agreed to give [place] permission to discuss obesity behavioral modification therapy today.  ASSESS: Christabelle has the diagnosis of obesity and her BMI today is 50.29 Kacee is in the action stage of change   ADVISE: Maddilynn was educated on the multiple health risks of obesity as well as the benefit of weight loss to improve her health. She was advised of the need for long term treatment and the importance of lifestyle modifications to improve her current health and to decrease her risk of future health problems.  AGREE: Multiple dietary modification options and treatment options were discussed and  Amri agreed to follow the recommendations documented in the above note.  ARRANGE: Adaliz was educated on the importance of frequent visits to treat obesity as outlined per CMS and USPSTF guidelines and agreed to schedule her next follow up appointment today.  I, Trixie Dredge, am acting as transcriptionist for Dennard Nip, MD  I have reviewed the above documentation for accuracy and completeness, and I agree with the above. -Dennard Nip, MD

## 2017-10-07 ENCOUNTER — Ambulatory Visit: Payer: BLUE CROSS/BLUE SHIELD | Admitting: Family Medicine

## 2017-10-08 ENCOUNTER — Other Ambulatory Visit: Payer: Self-pay | Admitting: Family Medicine

## 2017-10-19 ENCOUNTER — Ambulatory Visit (INDEPENDENT_AMBULATORY_CARE_PROVIDER_SITE_OTHER): Payer: 59 | Admitting: Family Medicine

## 2017-10-19 VITALS — BP 131/84 | HR 87 | Temp 97.9°F | Ht 62.0 in | Wt 276.0 lb

## 2017-10-19 DIAGNOSIS — Z6841 Body Mass Index (BMI) 40.0 and over, adult: Secondary | ICD-10-CM | POA: Diagnosis not present

## 2017-10-19 DIAGNOSIS — E559 Vitamin D deficiency, unspecified: Secondary | ICD-10-CM

## 2017-10-19 DIAGNOSIS — Z9189 Other specified personal risk factors, not elsewhere classified: Secondary | ICD-10-CM | POA: Diagnosis not present

## 2017-10-19 MED ORDER — VITAMIN D (ERGOCALCIFEROL) 1.25 MG (50000 UNIT) PO CAPS: 50000.0000 [IU] | ORAL_CAPSULE | ORAL | 0 refills | Status: DC

## 2017-10-19 NOTE — Progress Notes (Signed)
Office: [phone]  /  Fax: [phone]   HPI:   Chief Complaint: OBESITY Mindy Collins is here to discuss her progress with her obesity treatment plan. She is on the Category 3 plan and is following her eating plan approximately 80 % of the time. She states she is exercising 0 minutes 0 times per week. Mindy Collins is retaining some fluid today. She hasn't been sleeping well (only 2-3 hours a night) due to extra work stressors and extra work duties. She is still trying to meal plan the best she can.  Her weight is 276 lb (125.2 kg) today and has gained 1 pound since her last visit. She has lost 4 lbs since starting treatment with [place].  Vitamin D Deficiency Mindy Collins has a diagnosis of vitamin D deficiency. She is stable on prescription Vit D. She notes nausea with Vit D and worsening GERD, especially if she takes it in the evening. She denies vomiting or muscle weakness.  At risk for osteopenia and osteoporosis Mindy Collins is at higher risk of osteopenia and osteoporosis due to vitamin D deficiency.   ALLERGIES: Allergies  Allergen Reactions  . Esomeprazole Magnesium Shortness Of Breath    Nervousness; tolerates Aciphex.  . Adhesive [Tape] Itching and Rash    Itching and rash with adhesive tape  . Latex Itching and Dermatitis    Skin blistering  . Levocetirizine Dihydrochloride Other (See Comments)    Muscle jerks  . Nexium [Esomeprazole]     MEDICATIONS: Current Outpatient Medications on File Prior to Visit  Medication Sig Dispense Refill  . cyclobenzaprine (FLEXERIL) 10 MG tablet TAKE 1 TABLET BY MOUTH THREE TIMES A DAY IF NEEDED FOR MUSCLE SPASM 30 tablet 1  . fluticasone (FLONASE) 50 MCG/ACT nasal spray instill 1 spray into each nostril twice a day  0  . levonorgestrel (MIRENA) 20 MCG/24HR IUD 1 each by Intrauterine route once.    . loratadine (CLARITIN) 10 MG tablet Take 10 mg by mouth daily.    . meloxicam (MOBIC) 15 MG tablet take 1 tablet by mouth daily if needed  0  .  olmesartan-hydrochlorothiazide (BENICAR HCT) 20-12.5 MG tablet Take 1 tablet by mouth daily. 90 tablet 1  . vitamin B-12 (CYANOCOBALAMIN) 1000 MCG tablet Take 1,000 mcg by mouth daily.     No current facility-administered medications on file prior to visit.     PAST MEDICAL HISTORY: Past Medical History:  Diagnosis Date  . Abdominal wall seroma   . Anemia   . Arthritis   . Asthma   . B12 deficiency   . Back pain   . Carpal tunnel syndrome, bilateral   . Chest pain   . Colitis   . Constipation   . Dysmenorrhea   . Dyspnea   . Fatty liver   . GERD (gastroesophageal reflux disease)   . HLD (hyperlipidemia)   . HPV (human papilloma virus) infection    WARTS/ TCA [place]  . Hypertension   . Keratoconus   . Leg edema   . Obesity   . Obesity   . Osteoarthritis   . Palpitations   . Prediabetes   . Seasonal allergies     PAST SURGICAL HISTORY: Past Surgical History:  Procedure Laterality Date  . GYNECOLOGIC CRYOSURGERY  1993   DYSPLASIA CERVIX/   . MYOMECTOMY  02/17/2011   Procedure: MYOMECTOMY;  Surgeon: Terrance Mass, MD;  Location: [facility];  Service: Gynecology;  Laterality: N/A;  I am hoping to get a 7:30am time on Dec 4, Dec  11 or Dec 12. It not available I will check on some 1:00 pm times. Thanks  . MYOMECTOMY ABDOMINAL APPROACH  04/31/2007  . OVARIAN CYST REMOVAL  02/17/2011   Procedure: OVARIAN CYSTECTOMY;  Surgeon: Terrance Mass, MD;  Location: [facility];  Service: Gynecology;  Laterality: Left;    SOCIAL HISTORY: Social History   Tobacco Use  . Smoking status: Never Smoker  . Smokeless tobacco: Never Used  . Tobacco comment: denies tobacco   Substance Use Topics  . Alcohol use: No    Alcohol/week: 0.0 standard drinks  . Drug use: No    FAMILY HISTORY: Family History  Problem Relation Age of Onset  . Hypertension Mother   . Breast cancer Mother   . Cancer Mother   . Hyperlipidemia Mother   . Thyroid disease Mother   . Obesity Mother   . Heart disease  Maternal Grandmother   . Cancer Father        LUNG  . Breast cancer Maternal Aunt   . Diabetes Maternal Grandfather   . Cancer Paternal Aunt   . Ovarian cancer Paternal Aunt     ROS: Review of Systems  Constitutional: Negative for weight loss.  Gastrointestinal: Positive for nausea. Negative for vomiting.  Musculoskeletal:       Negative muscle weakness    PHYSICAL EXAM: Blood pressure 131/84, pulse 87, temperature 97.9 F (36.6 C), temperature source Oral, height 5\' 2"  (1.575 m), weight 276 lb (125.2 kg), SpO2 98 %. Body mass index is 50.48 kg/m. Physical Exam  Constitutional: She is oriented to person, place, and time. She appears well-developed and well-nourished.  Cardiovascular: Normal rate.  Pulmonary/Chest: Effort normal.  Musculoskeletal: Normal range of motion.  Neurological: She is oriented to person, place, and time.  Skin: Skin is warm and dry.  Psychiatric: She has a normal mood and affect. Her behavior is normal.  Vitals reviewed.   RECENT LABS AND TESTS: BMET    Component Value Date/Time   NA 143 09/07/2017 0917   K 4.0 09/07/2017 0917   CL 101 09/07/2017 0917   CO2 27 09/07/2017 0917   GLUCOSE 96 09/07/2017 0917   GLUCOSE 99 02/22/2016 1206   BUN 10 09/07/2017 0917   CREATININE 0.73 09/07/2017 0917   CREATININE 0.75 02/22/2016 1206   CALCIUM 9.7 09/07/2017 0917   GFRNONAA 100 09/07/2017 0917   GFRAA 115 09/07/2017 0917   Lab Results  Component Value Date   HGBA1C 6.0 (H) 09/07/2017   HGBA1C 5.9 (H) 03/05/2017   HGBA1C 6.0 (H) 09/27/2016   HGBA1C 5.7 (H) 01/21/2014   HGBA1C 5.4 12/15/2013   Lab Results  Component Value Date   INSULIN 14.8 09/07/2017   CBC    Component Value Date/Time   WBC 11.5 (H) 09/07/2017 0917   WBC 9.3 02/22/2016 1206   RBC 4.84 09/07/2017 0917   RBC 4.84 02/22/2016 1206   HGB 13.1 09/07/2017 0917   HGB 11.5 (L) 02/14/2011 1218   HCT 39.9 09/07/2017 0917   HCT 35.4 02/14/2011 1218   PLT 417 (H) 03/05/2017  1754   MCV 82 09/07/2017 0917   MCV 83.2 02/14/2011 1218   MCH 27.1 09/07/2017 0917   MCH 26.4 (L) 02/22/2016 1206   MCHC 32.8 09/07/2017 0917   MCHC 31.7 (L) 02/22/2016 1206   RDW 15.6 (H) 09/07/2017 0917   RDW 15.2 (H) 02/14/2011 1218   LYMPHSABS 2.6 09/07/2017 0917   LYMPHSABS 1.7 02/14/2011 1218   MONOABS 651 02/22/2016 1206  MONOABS 0.8 02/14/2011 1218   EOSABS 0.1 09/07/2017 0917   BASOSABS 0.0 09/07/2017 0917   BASOSABS 0.1 02/14/2011 1218   Iron/TIBC/Ferritin/ %Sat No results found for: IRON, TIBC, FERRITIN, IRONPCTSAT Lipid Panel     Component Value Date/Time   CHOL 235 (H) 09/07/2017 0917   TRIG 233 (H) 09/07/2017 0917   HDL 45 09/07/2017 0917   CHOLHDL 4.6 (H) 03/05/2017 1754   CHOLHDL 4.8 02/22/2016 1206   VLDL 21 02/22/2016 1206   LDLCALC 143 (H) 09/07/2017 0917   LDLDIRECT 141.6 04/16/2010 0743   Hepatic Function Panel     Component Value Date/Time   PROT 7.0 09/07/2017 0917   ALBUMIN 4.1 09/07/2017 0917   AST 28 09/07/2017 0917   ALT 44 (H) 09/07/2017 0917   ALKPHOS 127 (H) 09/07/2017 0917   BILITOT 0.4 09/07/2017 0917      Component Value Date/Time   TSH 3.190 09/07/2017 0917   TSH 2.190 03/05/2017 1754   TSH 1.72 02/22/2016 1206  Results for NAARA, KELTY (MRN [id number]) as of 10/19/2017 16:04  Ref. Range 09/07/2017 09:17  Vitamin D, 25-Hydroxy Latest Ref Range: 30.0 - 100.0 ng/mL 5.7 (L)    ASSESSMENT AND PLAN: Vitamin D deficiency - Plan: Vitamin D, Ergocalciferol, (DRISDOL) 50000 units CAPS capsule  At risk for osteoporosis  Class 3 severe obesity with serious comorbidity and body mass index (BMI) of 50.0 to 59.9 in adult, unspecified obesity type ([place])  PLAN:  Vitamin D Deficiency Mindy Collins was informed that low vitamin D levels contributes to fatigue and are associated with obesity, breast, and colon cancer. Mindy Collins agrees to continue taking prescription Vit D @50 ,000 IU every week #4 and we will refill for 1 month. She is to take Vit D with a  full meal in the morning. She will follow up for routine testing of vitamin D, at least 2-3 times per year. She was informed of the risk of over-replacement of vitamin D and agrees to not increase her dose unless she discusses this with [place] first. Mindy Collins agrees to follow up with our clinic in 2 weeks.  At risk for osteopenia and osteoporosis Mindy Collins was given extended  (15 minutes) osteoporosis prevention counseling today. Mindy Collins is at risk for osteopenia and osteoporsis due to her vitamin D deficiency. She was encouraged to take her vitamin D and follow her higher calcium diet and increase strengthening exercise to help strengthen her bones and decrease her risk of osteopenia and osteoporosis.  Obesity Mindy Collins is currently in the action stage of change. As such, her goal is to continue with weight loss efforts She has agreed to follow the Category 3 plan Mindy Collins has been instructed to work up to a goal of 150 minutes of combined cardio and strengthening exercise per week or Mindy Collins is to increase sleep before she worries about adding in exercise for weight loss and overall health benefits. We discussed the following Behavioral Modification Strategies today: increasing lean protein intake, decreasing simple carbohydrates  and work on meal planning and easy cooking plans   Mindy Collins has agreed to follow up with our clinic in 2 weeks. She was informed of the importance of frequent follow up visits to maximize her success with intensive lifestyle modifications for her multiple health conditions.   OBESITY BEHAVIORAL INTERVENTION VISIT  Today's visit was # 3   Starting weight: 280 lbs Starting date: 09/07/17 Today's weight : 276 lbs  Today's date: 10/19/2017 Total lbs lost to date: 4    ASK: We discussed the  diagnosis of obesity with Fernande Bras today and Saharah agreed to give [place] permission to discuss obesity behavioral modification therapy today.  ASSESS: Kameko has the diagnosis of obesity and her BMI  today is 50.47 Ileene is in the action stage of change   ADVISE: Britne was educated on the multiple health risks of obesity as well as the benefit of weight loss to improve her health. She was advised of the need for long term treatment and the importance of lifestyle modifications to improve her current health and to decrease her risk of future health problems.  AGREE: Multiple dietary modification options and treatment options were discussed and  Fritzie agreed to follow the recommendations documented in the above note.  ARRANGE: Annastasia was educated on the importance of frequent visits to treat obesity as outlined per CMS and USPSTF guidelines and agreed to schedule her next follow up appointment today.  I, Trixie Dredge, am acting as transcriptionist for Dennard Nip, MD  I have reviewed the above documentation for accuracy and completeness, and I agree with the above. -Dennard Nip, MD

## 2017-11-02 ENCOUNTER — Ambulatory Visit (INDEPENDENT_AMBULATORY_CARE_PROVIDER_SITE_OTHER): Payer: 59 | Admitting: Bariatrics

## 2017-11-02 VITALS — BP 109/69 | HR 82 | Temp 98.3°F | Ht 62.0 in | Wt 277.0 lb

## 2017-11-02 DIAGNOSIS — I10 Essential (primary) hypertension: Secondary | ICD-10-CM | POA: Diagnosis not present

## 2017-11-02 DIAGNOSIS — Z9189 Other specified personal risk factors, not elsewhere classified: Secondary | ICD-10-CM

## 2017-11-02 DIAGNOSIS — Z6841 Body Mass Index (BMI) 40.0 and over, adult: Secondary | ICD-10-CM

## 2017-11-02 DIAGNOSIS — E559 Vitamin D deficiency, unspecified: Secondary | ICD-10-CM

## 2017-11-03 NOTE — Progress Notes (Signed)
Office: [phone]  /  Fax: [phone]   HPI:   Chief Complaint: OBESITY Mindy Collins is here to discuss her progress with her obesity treatment plan. She is on the  follow the Category 3 plan and is following her eating plan approximately 20 % of the time. She states she is exercising 0 minutes 0 times per week. Mindy Collins is currently struggling with increase stress at work with a conference and working linger hours. She has skipped some meals states she "grabs what you can get". She feels like she is eating the same things.  Her weight is 277 lb (125.6 kg) today and has not lost weight since her last visit. She has lost 3 lbs since starting treatment with [place].  Vitamin D deficiency Mindy Collins has a diagnosis of vitamin D deficiency. She is currently taking vit D. She reports a decrease nausea, vomiting that she had before and denies muscle weakness.   Ref. Range 09/07/2017 09:17  Vitamin D, 25-Hydroxy Latest Ref Range: 30.0 - 100.0 ng/mL 5.7 (L)   Hypertension Mindy Collins is a 48 y.o. female with hypertension.  Mindy Collins denies chest pain, lightheadedness or shortness of breath on exertion. She is working weight loss to help control her blood pressure with the goal of decreasing her risk of heart attack and stroke. Mindy Collins is currently taking olmesartan-hctz.  blood pressure is currently controlled.   ALLERGIES: Allergies  Allergen Reactions  . Esomeprazole Magnesium Shortness Of Breath    Nervousness; tolerates Aciphex.  . Adhesive [Tape] Itching and Rash    Itching and rash with adhesive tape  . Latex Itching and Dermatitis    Skin blistering  . Levocetirizine Dihydrochloride Other (See Comments)    Muscle jerks  . Nexium [Esomeprazole]     MEDICATIONS: Current Outpatient Medications on File Prior to Visit  Medication Sig Dispense Refill  . cyclobenzaprine (FLEXERIL) 10 MG tablet TAKE 1 TABLET BY MOUTH THREE TIMES A DAY IF NEEDED FOR MUSCLE SPASM 30 tablet 1  . fluticasone (FLONASE)  50 MCG/ACT nasal spray instill 1 spray into each nostril twice a day  0  . levonorgestrel (MIRENA) 20 MCG/24HR IUD 1 each by Intrauterine route once.    . loratadine (CLARITIN) 10 MG tablet Take 10 mg by mouth daily.    . meloxicam (MOBIC) 15 MG tablet take 1 tablet by mouth daily if needed  0  . olmesartan-hydrochlorothiazide (BENICAR HCT) 20-12.5 MG tablet Take 1 tablet by mouth daily. 90 tablet 1  . vitamin B-12 (CYANOCOBALAMIN) 1000 MCG tablet Take 1,000 mcg by mouth daily.    . Vitamin D, Ergocalciferol, (DRISDOL) 50000 units CAPS capsule Take 1 capsule (50,000 Units total) by mouth every 7 (seven) days. 4 capsule 0   No current facility-administered medications on file prior to visit.     PAST MEDICAL HISTORY: Past Medical History:  Diagnosis Date  . Abdominal wall seroma   . Anemia   . Arthritis   . Asthma   . B12 deficiency   . Back pain   . Carpal tunnel syndrome, bilateral   . Chest pain   . Colitis   . Constipation   . Dysmenorrhea   . Dyspnea   . Fatty liver   . GERD (gastroesophageal reflux disease)   . HLD (hyperlipidemia)   . HPV (human papilloma virus) infection    WARTS/ TCA [place]  . Hypertension   . Keratoconus   . Leg edema   . Obesity   . Obesity   . Osteoarthritis   .  Palpitations   . Prediabetes   . Seasonal allergies     PAST SURGICAL HISTORY: Past Surgical History:  Procedure Laterality Date  . GYNECOLOGIC CRYOSURGERY  1993   DYSPLASIA CERVIX/   . MYOMECTOMY  02/17/2011   Procedure: MYOMECTOMY;  Surgeon: Terrance Mass, MD;  Location: [facility];  Service: Gynecology;  Laterality: N/A;  I am hoping to get a 7:30am time on Dec 4, Dec 11 or Dec 12. It not available I will check on some 1:00 pm times. Thanks  . MYOMECTOMY ABDOMINAL APPROACH  04/31/2007  . OVARIAN CYST REMOVAL  02/17/2011   Procedure: OVARIAN CYSTECTOMY;  Surgeon: Terrance Mass, MD;  Location: [facility];  Service: Gynecology;  Laterality: Left;    SOCIAL HISTORY: Social History    Tobacco Use  . Smoking status: Never Smoker  . Smokeless tobacco: Never Used  . Tobacco comment: denies tobacco   Substance Use Topics  . Alcohol use: No    Alcohol/week: 0.0 standard drinks  . Drug use: No    FAMILY HISTORY: Family History  Problem Relation Age of Onset  . Hypertension Mother   . Breast cancer Mother   . Cancer Mother   . Hyperlipidemia Mother   . Thyroid disease Mother   . Obesity Mother   . Heart disease Maternal Grandmother   . Cancer Father        LUNG  . Breast cancer Maternal Aunt   . Diabetes Maternal Grandfather   . Cancer Paternal Aunt   . Ovarian cancer Paternal Aunt     ROS: Review of Systems  Constitutional: Negative for weight loss.  Respiratory: Negative for shortness of breath.   Cardiovascular: Negative for chest pain.  Gastrointestinal: Positive for nausea and vomiting.  Musculoskeletal:       Negative for muscle weakness  Neurological:       Negative for lightheadedness    PHYSICAL EXAM: Blood pressure 109/69, pulse 82, temperature 98.3 F (36.8 C), temperature source Oral, height 5\' 2"  (1.575 m), weight 277 lb (125.6 kg), SpO2 99 %. Body mass index is 50.66 kg/m. Physical Exam  Constitutional: She is oriented to person, place, and time. She appears well-developed and well-nourished.  HENT:  Head: Normocephalic.  Cardiovascular: Normal rate.  Pulmonary/Chest: Effort normal.  Musculoskeletal: Normal range of motion.  Neurological: She is oriented to person, place, and time.  Skin: Skin is warm and dry.  Psychiatric: She has a normal mood and affect. Her behavior is normal.  Vitals reviewed.   RECENT LABS AND TESTS: BMET    Component Value Date/Time   NA 143 09/07/2017 0917   K 4.0 09/07/2017 0917   CL 101 09/07/2017 0917   CO2 27 09/07/2017 0917   GLUCOSE 96 09/07/2017 0917   GLUCOSE 99 02/22/2016 1206   BUN 10 09/07/2017 0917   CREATININE 0.73 09/07/2017 0917   CREATININE 0.75 02/22/2016 1206   CALCIUM 9.7  09/07/2017 0917   GFRNONAA 100 09/07/2017 0917   GFRAA 115 09/07/2017 0917   Lab Results  Component Value Date   HGBA1C 6.0 (H) 09/07/2017   HGBA1C 5.9 (H) 03/05/2017   HGBA1C 6.0 (H) 09/27/2016   HGBA1C 5.7 (H) 01/21/2014   HGBA1C 5.4 12/15/2013   Lab Results  Component Value Date   INSULIN 14.8 09/07/2017   CBC    Component Value Date/Time   WBC 11.5 (H) 09/07/2017 0917   WBC 9.3 02/22/2016 1206   RBC 4.84 09/07/2017 0917   RBC 4.84 02/22/2016 1206  HGB 13.1 09/07/2017 0917   HGB 11.5 (L) 02/14/2011 1218   HCT 39.9 09/07/2017 0917   HCT 35.4 02/14/2011 1218   PLT 417 (H) 03/05/2017 1754   MCV 82 09/07/2017 0917   MCV 83.2 02/14/2011 1218   MCH 27.1 09/07/2017 0917   MCH 26.4 (L) 02/22/2016 1206   MCHC 32.8 09/07/2017 0917   MCHC 31.7 (L) 02/22/2016 1206   RDW 15.6 (H) 09/07/2017 0917   RDW 15.2 (H) 02/14/2011 1218   LYMPHSABS 2.6 09/07/2017 0917   LYMPHSABS 1.7 02/14/2011 1218   MONOABS 651 02/22/2016 1206   MONOABS 0.8 02/14/2011 1218   EOSABS 0.1 09/07/2017 0917   BASOSABS 0.0 09/07/2017 0917   BASOSABS 0.1 02/14/2011 1218   Iron/TIBC/Ferritin/ %Sat No results found for: IRON, TIBC, FERRITIN, IRONPCTSAT Lipid Panel     Component Value Date/Time   CHOL 235 (H) 09/07/2017 0917   TRIG 233 (H) 09/07/2017 0917   HDL 45 09/07/2017 0917   CHOLHDL 4.6 (H) 03/05/2017 1754   CHOLHDL 4.8 02/22/2016 1206   VLDL 21 02/22/2016 1206   LDLCALC 143 (H) 09/07/2017 0917   LDLDIRECT 141.6 04/16/2010 0743   Hepatic Function Panel     Component Value Date/Time   PROT 7.0 09/07/2017 0917   ALBUMIN 4.1 09/07/2017 0917   AST 28 09/07/2017 0917   ALT 44 (H) 09/07/2017 0917   ALKPHOS 127 (H) 09/07/2017 0917   BILITOT 0.4 09/07/2017 0917      Component Value Date/Time   TSH 3.190 09/07/2017 0917   TSH 2.190 03/05/2017 1754   TSH 1.72 02/22/2016 1206     Ref. Range 09/07/2017 09:17  Vitamin D, 25-Hydroxy Latest Ref Range: 30.0 - 100.0 ng/mL 5.7 (L)   ASSESSMENT  AND PLAN: Vitamin D deficiency  Essential hypertension  At risk for heart disease  Class 3 severe obesity with serious comorbidity and body mass index (BMI) of 50.0 to 59.9 in adult, unspecified obesity type ([place])  PLAN: Vitamin D Deficiency Mindy Collins was informed that low vitamin D levels contributes to fatigue and are associated with obesity, breast, and colon cancer. She agrees to continue to take prescription Vit D @50 ,000 IU every week, increase vitamin D rich foods, and will follow up for routine testing of vitamin D, at least 2-3 times per year. She was informed of the risk of over-replacement of vitamin D and agrees to not increase her dose unless she discusses this with [place] first. She agrees to follow up in our clinic as directed.   Hypertension We discussed sodium restriction, working on healthy weight loss, and a regular exercise program as the means to achieve improved blood pressure control. Mindy Collins agreed with this plan and agreed to follow up as directed. We will continue to monitor her blood pressure as well as her progress with the above lifestyle modifications. She will continue her medications as prescribed and will watch for signs of hypotension as she continues her lifestyle modifications.  Obesity Mindy Collins is currently in the action stage of change. As such, her goal is to continue with weight loss efforts She has agreed to follow the Category 3 plan and journal 450-550 calories 90g of protein for supper. She agrees to not miss meals.  Mindy Collins has been instructed to work up to a goal of 150 minutes of combined cardio and strengthening exercise per week for weight loss and overall health benefits. We discussed the following Behavioral Modification Stratagies today: increasing lean protein intake, decreasing simple carbohydrates , increasing vegetables, decreasing sodium  intake, decrease eating out, increase water intake, no skipping meals, and keeping a strict food journal.    Mindy Collins  has agreed to follow up with our clinic in 2 weeks. She was informed of the importance of frequent follow up visits to maximize her success with intensive lifestyle modifications for her multiple health conditions.   OBESITY BEHAVIORAL INTERVENTION VISIT  Today's visit was # 4   Starting weight: 280 lb Starting date: 09/07/17 Today's weight : 277 lb Today's date:11/02/17 Total lbs lost to date: 3 lb    ASK: We discussed the diagnosis of obesity with Mindy Collins today and Mindy Collins agreed to give [place] permission to discuss obesity behavioral modification therapy today.  ASSESS: Mindy Collins has the diagnosis of obesity and her BMI today is 50.65 Mindy Collins is in the action stage of change   ADVISE: Mindy Collins was educated on the multiple health risks of obesity as well as the benefit of weight loss to improve her health. She was advised of the need for long term treatment and the importance of lifestyle modifications to improve her current health and to decrease her risk of future health problems.  AGREE: Multiple dietary modification options and treatment options were discussed and  Mindy Collins agreed to follow the recommendations documented in the above note.  ARRANGE: Mindy Collins was educated on the importance of frequent visits to treat obesity as outlined per CMS and USPSTF guidelines and agreed to schedule her next follow up appointment today.  Leary Roca, am acting as transcriptionist for [employer], DO  I have reviewed the above documentation for accuracy and completeness, and I agree with the above. -Jearld Lesch, DO

## 2017-11-16 ENCOUNTER — Other Ambulatory Visit (INDEPENDENT_AMBULATORY_CARE_PROVIDER_SITE_OTHER): Payer: Self-pay | Admitting: Family Medicine

## 2017-11-16 DIAGNOSIS — E559 Vitamin D deficiency, unspecified: Secondary | ICD-10-CM

## 2017-11-17 ENCOUNTER — Ambulatory Visit (INDEPENDENT_AMBULATORY_CARE_PROVIDER_SITE_OTHER): Payer: 59 | Admitting: Bariatrics

## 2017-11-23 ENCOUNTER — Ambulatory Visit (INDEPENDENT_AMBULATORY_CARE_PROVIDER_SITE_OTHER): Payer: 59 | Admitting: Bariatrics

## 2017-11-23 VITALS — BP 118/84 | HR 81 | Temp 97.9°F | Ht 62.0 in | Wt 277.0 lb

## 2017-11-23 DIAGNOSIS — I10 Essential (primary) hypertension: Secondary | ICD-10-CM

## 2017-11-23 DIAGNOSIS — E559 Vitamin D deficiency, unspecified: Secondary | ICD-10-CM

## 2017-11-23 DIAGNOSIS — Z6841 Body Mass Index (BMI) 40.0 and over, adult: Secondary | ICD-10-CM

## 2017-11-23 DIAGNOSIS — Z9189 Other specified personal risk factors, not elsewhere classified: Secondary | ICD-10-CM | POA: Diagnosis not present

## 2017-11-23 MED ORDER — VITAMIN D (ERGOCALCIFEROL) 1.25 MG (50000 UNIT) PO CAPS: 50000.0000 [IU] | ORAL_CAPSULE | ORAL | 0 refills | Status: DC

## 2017-11-24 NOTE — Progress Notes (Signed)
Office: [phone]  /  Fax: [phone]   HPI:   Chief Complaint: OBESITY Mindy Collins is here to discuss her progress with her obesity treatment plan. She is on the Category 3 plan and food journal 450 to 550 calories and 30 grams of protein for lunch and is following her eating plan approximately 10 % of the time. She states she is exercising 0 minutes 0 times per week. Mindy Collins is not following the plan closely and is not journaling. She has had increased stress and increased hours at work. She has had decreased meal preparation, but doing ok for lunch.  Her weight is 277 lb (125.6 kg) today and has not lost weight since her last visit. She has lost 3 lbs since starting treatment with [place].  Hypertension Mindy Collins is a 47 y.o. female with hypertension. She is working on weight loss to help control her blood pressure with the goal of decreasing her risk of heart attack and stroke. She is taking olmesartan-HCTZ 20/12.5mg  and she denies side effects. Elta's blood pressure is reasonably well controlled. Analissa denies chest pain.  At risk for cardiovascular disease Mindy Collins is at a higher than average risk for cardiovascular disease due to hypertension and obesity.   Vitamin D deficiency Mindy Collins has a diagnosis of vitamin D deficiency. She is currently taking high dose vit D and denies nausea, vomiting or muscle weakness.  ALLERGIES: Allergies  Allergen Reactions  . Esomeprazole Magnesium Shortness Of Breath    Nervousness; tolerates Aciphex.  . Adhesive [Tape] Itching and Rash    Itching and rash with adhesive tape  . Latex Itching and Dermatitis    Skin blistering  . Levocetirizine Dihydrochloride Other (See Comments)    Muscle jerks  . Nexium [Esomeprazole]     MEDICATIONS: Current Outpatient Medications on File Prior to Visit  Medication Sig Dispense Refill  . cyclobenzaprine (FLEXERIL) 10 MG tablet TAKE 1 TABLET BY MOUTH THREE TIMES A DAY IF NEEDED FOR MUSCLE SPASM 30 tablet 1  .  fluticasone (FLONASE) 50 MCG/ACT nasal spray instill 1 spray into each nostril twice a day  0  . levonorgestrel (MIRENA) 20 MCG/24HR IUD 1 each by Intrauterine route once.    . loratadine (CLARITIN) 10 MG tablet Take 10 mg by mouth daily.    . meloxicam (MOBIC) 15 MG tablet take 1 tablet by mouth daily if needed  0  . olmesartan-hydrochlorothiazide (BENICAR HCT) 20-12.5 MG tablet Take 1 tablet by mouth daily. 90 tablet 1  . vitamin B-12 (CYANOCOBALAMIN) 1000 MCG tablet Take 1,000 mcg by mouth daily.     No current facility-administered medications on file prior to visit.     PAST MEDICAL HISTORY: Past Medical History:  Diagnosis Date  . Abdominal wall seroma   . Anemia   . Arthritis   . Asthma   . B12 deficiency   . Back pain   . Carpal tunnel syndrome, bilateral   . Chest pain   . Colitis   . Constipation   . Dysmenorrhea   . Dyspnea   . Fatty liver   . GERD (gastroesophageal reflux disease)   . HLD (hyperlipidemia)   . HPV (human papilloma virus) infection    WARTS/ TCA [place]  . Hypertension   . Keratoconus   . Leg edema   . Obesity   . Obesity   . Osteoarthritis   . Palpitations   . Prediabetes   . Seasonal allergies     PAST SURGICAL HISTORY: Past Surgical History:  Procedure Laterality Date  . GYNECOLOGIC CRYOSURGERY  1993   DYSPLASIA CERVIX/   . MYOMECTOMY  02/17/2011   Procedure: MYOMECTOMY;  Surgeon: Terrance Mass, MD;  Location: [facility];  Service: Gynecology;  Laterality: N/A;  I am hoping to get a 7:30am time on Dec 4, Dec 11 or Dec 12. It not available I will check on some 1:00 pm times. Thanks  . MYOMECTOMY ABDOMINAL APPROACH  04/31/2007  . OVARIAN CYST REMOVAL  02/17/2011   Procedure: OVARIAN CYSTECTOMY;  Surgeon: Terrance Mass, MD;  Location: [facility];  Service: Gynecology;  Laterality: Left;    SOCIAL HISTORY: Social History   Tobacco Use  . Smoking status: Never Smoker  . Smokeless tobacco: Never Used  . Tobacco comment: denies tobacco     Substance Use Topics  . Alcohol use: No    Alcohol/week: 0.0 standard drinks  . Drug use: No    FAMILY HISTORY: Family History  Problem Relation Age of Onset  . Hypertension Mother   . Breast cancer Mother   . Cancer Mother   . Hyperlipidemia Mother   . Thyroid disease Mother   . Obesity Mother   . Heart disease Maternal Grandmother   . Cancer Father        LUNG  . Breast cancer Maternal Aunt   . Diabetes Maternal Grandfather   . Cancer Paternal Aunt   . Ovarian cancer Paternal Aunt     ROS: Review of Systems  Constitutional: Negative for weight loss.  Cardiovascular: Negative for chest pain.  Gastrointestinal: Negative for nausea and vomiting.  Musculoskeletal:       Negative for muscle aches.    PHYSICAL EXAM: Blood pressure 118/84, pulse 81, temperature 97.9 F (36.6 C), temperature source Oral, height 5\' 2"  (1.575 m), weight 277 lb (125.6 kg), SpO2 97 %. Body mass index is 50.66 kg/m. Physical Exam  Constitutional: She is oriented to person, place, and time. She appears well-developed and well-nourished.  Cardiovascular: Normal rate.  Pulmonary/Chest: Effort normal.  Musculoskeletal: Normal range of motion.  Neurological: She is oriented to person, place, and time.  Skin: Skin is warm and dry.  Psychiatric: She has a normal mood and affect. Her behavior is normal.  Vitals reviewed.   RECENT LABS AND TESTS: BMET    Component Value Date/Time   NA 143 09/07/2017 0917   K 4.0 09/07/2017 0917   CL 101 09/07/2017 0917   CO2 27 09/07/2017 0917   GLUCOSE 96 09/07/2017 0917   GLUCOSE 99 02/22/2016 1206   BUN 10 09/07/2017 0917   CREATININE 0.73 09/07/2017 0917   CREATININE 0.75 02/22/2016 1206   CALCIUM 9.7 09/07/2017 0917   GFRNONAA 100 09/07/2017 0917   GFRAA 115 09/07/2017 0917   Lab Results  Component Value Date   HGBA1C 6.0 (H) 09/07/2017   HGBA1C 5.9 (H) 03/05/2017   HGBA1C 6.0 (H) 09/27/2016   HGBA1C 5.7 (H) 01/21/2014   HGBA1C 5.4  12/15/2013   Lab Results  Component Value Date   INSULIN 14.8 09/07/2017   CBC    Component Value Date/Time   WBC 11.5 (H) 09/07/2017 0917   WBC 9.3 02/22/2016 1206   RBC 4.84 09/07/2017 0917   RBC 4.84 02/22/2016 1206   HGB 13.1 09/07/2017 0917   HGB 11.5 (L) 02/14/2011 1218   HCT 39.9 09/07/2017 0917   HCT 35.4 02/14/2011 1218   PLT 417 (H) 03/05/2017 1754   MCV 82 09/07/2017 0917   MCV 83.2 02/14/2011 1218  MCH 27.1 09/07/2017 0917   MCH 26.4 (L) 02/22/2016 1206   MCHC 32.8 09/07/2017 0917   MCHC 31.7 (L) 02/22/2016 1206   RDW 15.6 (H) 09/07/2017 0917   RDW 15.2 (H) 02/14/2011 1218   LYMPHSABS 2.6 09/07/2017 0917   LYMPHSABS 1.7 02/14/2011 1218   MONOABS 651 02/22/2016 1206   MONOABS 0.8 02/14/2011 1218   EOSABS 0.1 09/07/2017 0917   BASOSABS 0.0 09/07/2017 0917   BASOSABS 0.1 02/14/2011 1218   Iron/TIBC/Ferritin/ %Sat No results found for: IRON, TIBC, FERRITIN, IRONPCTSAT Lipid Panel     Component Value Date/Time   CHOL 235 (H) 09/07/2017 0917   TRIG 233 (H) 09/07/2017 0917   HDL 45 09/07/2017 0917   CHOLHDL 4.6 (H) 03/05/2017 1754   CHOLHDL 4.8 02/22/2016 1206   VLDL 21 02/22/2016 1206   LDLCALC 143 (H) 09/07/2017 0917   LDLDIRECT 141.6 04/16/2010 0743   Hepatic Function Panel     Component Value Date/Time   PROT 7.0 09/07/2017 0917   ALBUMIN 4.1 09/07/2017 0917   AST 28 09/07/2017 0917   ALT 44 (H) 09/07/2017 0917   ALKPHOS 127 (H) 09/07/2017 0917   BILITOT 0.4 09/07/2017 0917      Component Value Date/Time   TSH 3.190 09/07/2017 0917   TSH 2.190 03/05/2017 1754   TSH 1.72 02/22/2016 1206   Results for ANALIAH, DRUM (MRN [id number]) as of 11/24/2017 17:00  Ref. Range 09/07/2017 09:17  Vitamin D, 25-Hydroxy Latest Ref Range: 30.0 - 100.0 ng/mL 5.7 (L)   ASSESSMENT AND PLAN: Essential hypertension  Vitamin D deficiency - Plan: Vitamin D, Ergocalciferol, (DRISDOL) 50000 units CAPS capsule  At risk for heart disease  Class 3 severe obesity  with serious comorbidity and body mass index (BMI) of 50.0 to 59.9 in adult, unspecified obesity type ([place])  PLAN:  Hypertension We discussed sodium restriction, working on healthy weight loss, and a regular exercise program as the means to achieve improved blood pressure control.  We will continue to monitor her blood pressure as well as her progress with the above lifestyle modifications. She will continue her antihypertensive medications as prescribed and decrease sodium. She will watch for signs of hypotension as she continues her lifestyle modifications. Mindy Collins agreed with this plan and agreed to follow up as directed in 2 weeks.  Cardiovascular risk counseling Mindy Collins was given extended (15 minutes) coronary artery disease prevention counseling today. She is 48 y.o. female and has risk factors for heart disease including hypertension and obesity. We discussed intensive lifestyle modifications today with an emphasis on specific weight loss instructions and strategies. Pt was also informed of the importance of increasing exercise and decreasing saturated fats to help prevent heart disease.  Vitamin D Deficiency Mindy Collins was informed that low vitamin D levels contributes to fatigue and are associated with obesity, breast, and colon cancer. She agrees to continue to take prescription Vit D @50 ,000 IU every week #4 with no refills and will follow up for routine testing of vitamin D, at least 2-3 times per year. She was informed of the risk of over-replacement of vitamin D and agrees to not increase her dose unless she discusses this with [place] first. Mindy Collins agrees to follow up in 2 weeks.  Obesity Mindy Collins is currently in the action stage of change. As such, her goal is to continue with weight loss efforts. She has agreed to follow the Category 3 plan and food journal 450 to 550 calories and 30 grams of protein for lunch. She agrees to  increase water intake. She was given an informational sheet on High Protein  Foods and Options for" Eating Out". Mindy Collins has been instructed to work up to a goal of 150 minutes of combined cardio and strengthening exercise per week for weight loss and overall health benefits. We discussed the following Behavioral Modification Strategies today: increasing lean protein intake, decreasing simple carbohydrates, increasing vegetables, increase H2O intake, decrease eating out, no skipping meals,and work on meal planning and easy cooking plans.  Mindy Collins has agreed to follow up with our clinic in 2 weeks for a fasting appointment. She was informed of the importance of frequent follow up visits to maximize her success with intensive lifestyle modifications for her multiple health conditions.   OBESITY BEHAVIORAL INTERVENTION VISIT  Today's visit was # 5   Starting weight: 280 lbs Starting date: 09/07/17 Today's weight : Weight: 277 lb (125.6 kg)  Today's date: 11/23/2017 Total lbs lost to date: 3  ASK: We discussed the diagnosis of obesity with Mindy Collins today and Mindy Collins agreed to give [place] permission to discuss obesity behavioral modification therapy today.  ASSESS: Junell has the diagnosis of obesity and her BMI today is 50.65 Pamella is in the action stage of change.   ADVISE: Mindy Collins was educated on the multiple health risks of obesity as well as the benefit of weight loss to improve her health. She was advised of the need for long term treatment and the importance of lifestyle modifications to improve her current health and to decrease her risk of future health problems.  AGREE: Multiple dietary modification options and treatment options were discussed and Mindy Collins agreed to follow the recommendations documented in the above note.  ARRANGE: Deziree was educated on the importance of frequent visits to treat obesity as outlined per CMS and USPSTF guidelines and agreed to schedule her next follow up appointment today.  I, Marcille Blanco, am acting as Location manager for [employer].  Owens Shark, DO  I have reviewed the above documentation for accuracy and completeness, and I agree with the above. -Jearld Lesch, DO

## 2017-11-25 ENCOUNTER — Encounter (INDEPENDENT_AMBULATORY_CARE_PROVIDER_SITE_OTHER): Payer: Self-pay | Admitting: Bariatrics

## 2017-12-07 ENCOUNTER — Ambulatory Visit (INDEPENDENT_AMBULATORY_CARE_PROVIDER_SITE_OTHER): Payer: 59 | Admitting: Bariatrics

## 2017-12-14 ENCOUNTER — Ambulatory Visit (INDEPENDENT_AMBULATORY_CARE_PROVIDER_SITE_OTHER): Payer: 59 | Admitting: Bariatrics

## 2017-12-14 ENCOUNTER — Encounter (INDEPENDENT_AMBULATORY_CARE_PROVIDER_SITE_OTHER): Payer: Self-pay

## 2017-12-14 ENCOUNTER — Ambulatory Visit (INDEPENDENT_AMBULATORY_CARE_PROVIDER_SITE_OTHER): Payer: Self-pay | Admitting: Family Medicine

## 2017-12-17 ENCOUNTER — Other Ambulatory Visit: Payer: Self-pay | Admitting: Obstetrics & Gynecology

## 2017-12-17 DIAGNOSIS — R102 Pelvic and perineal pain: Secondary | ICD-10-CM

## 2017-12-21 ENCOUNTER — Other Ambulatory Visit (INDEPENDENT_AMBULATORY_CARE_PROVIDER_SITE_OTHER): Payer: Self-pay | Admitting: Bariatrics

## 2017-12-21 ENCOUNTER — Encounter (INDEPENDENT_AMBULATORY_CARE_PROVIDER_SITE_OTHER): Payer: Self-pay | Admitting: Family Medicine

## 2017-12-21 ENCOUNTER — Ambulatory Visit (INDEPENDENT_AMBULATORY_CARE_PROVIDER_SITE_OTHER): Payer: 59 | Admitting: Family Medicine

## 2017-12-21 VITALS — BP 122/83 | HR 84 | Temp 98.0°F | Ht 62.0 in | Wt 276.0 lb

## 2017-12-21 DIAGNOSIS — E559 Vitamin D deficiency, unspecified: Secondary | ICD-10-CM

## 2017-12-21 DIAGNOSIS — R7303 Prediabetes: Secondary | ICD-10-CM

## 2017-12-21 DIAGNOSIS — Z6841 Body Mass Index (BMI) 40.0 and over, adult: Secondary | ICD-10-CM

## 2017-12-21 DIAGNOSIS — Z9189 Other specified personal risk factors, not elsewhere classified: Secondary | ICD-10-CM | POA: Diagnosis not present

## 2017-12-21 DIAGNOSIS — F3289 Other specified depressive episodes: Secondary | ICD-10-CM

## 2017-12-21 DIAGNOSIS — I10 Essential (primary) hypertension: Secondary | ICD-10-CM

## 2017-12-21 DIAGNOSIS — E7849 Other hyperlipidemia: Secondary | ICD-10-CM | POA: Diagnosis not present

## 2017-12-21 MED ORDER — VITAMIN D (ERGOCALCIFEROL) 1.25 MG (50000 UNIT) PO CAPS: 50000.0000 [IU] | ORAL_CAPSULE | ORAL | 0 refills | Status: DC

## 2017-12-22 LAB — COMPREHENSIVE METABOLIC PANEL
ALT: 35 IU/L — ABNORMAL HIGH (ref 0–32)
AST: 29 IU/L (ref 0–40)
Albumin/Globulin Ratio: 1.4 (ref 1.2–2.2)
Albumin: 4.2 g/dL (ref 3.5–5.5)
Alkaline Phosphatase: 127 IU/L — ABNORMAL HIGH (ref 39–117)
BUN/Creatinine Ratio: 13 (ref 9–23)
BUN: 11 mg/dL (ref 6–24)
Bilirubin Total: 0.5 mg/dL (ref 0.0–1.2)
CO2: 27 mmol/L (ref 20–29)
Calcium: 9.7 mg/dL (ref 8.7–10.2)
Chloride: 102 mmol/L (ref 96–106)
Creatinine, Ser: 0.83 mg/dL (ref 0.57–1.00)
GFR calc Af Amer: 98 mL/min/{1.73_m2} (ref >59)
GFR calc non Af Amer: 85 mL/min/{1.73_m2} (ref >59)
Globulin, Total: 2.9 g/dL (ref 1.5–4.5)
Glucose: 91 mg/dL (ref 65–99)
Potassium: 4.4 mmol/L (ref 3.5–5.2)
Sodium: 146 mmol/L — ABNORMAL HIGH (ref 134–144)
Total Protein: 7.1 g/dL (ref 6.0–8.5)

## 2017-12-22 LAB — LIPID PANEL WITH LDL/HDL RATIO
Cholesterol, Total: 242 mg/dL — ABNORMAL HIGH (ref 100–199)
HDL: 48 mg/dL (ref >39)
LDL Calculated: 169 mg/dL — ABNORMAL HIGH (ref 0–99)
LDl/HDL Ratio: 3.5 ratio — ABNORMAL HIGH (ref 0.0–3.2)
Triglycerides: 124 mg/dL (ref 0–149)
VLDL Cholesterol Cal: 25 mg/dL (ref 5–40)

## 2017-12-22 LAB — HEMOGLOBIN A1C
Est. average glucose Bld gHb Est-mCnc: 123 mg/dL
Hgb A1c MFr Bld: 5.9 % — ABNORMAL HIGH (ref 4.8–5.6)

## 2017-12-22 LAB — CBC WITH DIFFERENTIAL
Basophils Absolute: 0.1 10*3/uL (ref 0.0–0.2)
Basos: 0 %
EOS (ABSOLUTE): 0.1 10*3/uL (ref 0.0–0.4)
Eos: 1 %
Hematocrit: 42 % (ref 34.0–46.6)
Hemoglobin: 13.7 g/dL (ref 11.1–15.9)
Immature Grans (Abs): 0 10*3/uL (ref 0.0–0.1)
Immature Granulocytes: 0 %
Lymphocytes Absolute: 2.2 10*3/uL (ref 0.7–3.1)
Lymphs: 18 %
MCH: 26.5 pg — ABNORMAL LOW (ref 26.6–33.0)
MCHC: 32.6 g/dL (ref 31.5–35.7)
MCV: 81 fL (ref 79–97)
Monocytes Absolute: 0.9 10*3/uL (ref 0.1–0.9)
Monocytes: 8 %
Neutrophils Absolute: 8.7 10*3/uL — ABNORMAL HIGH (ref 1.4–7.0)
Neutrophils: 73 %
RBC: 5.17 x10E6/uL (ref 3.77–5.28)
RDW: 13.4 % (ref 12.3–15.4)
WBC: 12 10*3/uL — ABNORMAL HIGH (ref 3.4–10.8)

## 2017-12-22 LAB — INSULIN, RANDOM: INSULIN: 32.3 u[IU]/mL — ABNORMAL HIGH (ref 2.6–24.9)

## 2017-12-22 LAB — VITAMIN D 25 HYDROXY (VIT D DEFICIENCY, FRACTURES): Vit D, 25-Hydroxy: 13.7 ng/mL — ABNORMAL LOW (ref 30.0–100.0)

## 2017-12-22 NOTE — Progress Notes (Signed)
Office: [phone]  /  Fax: [phone]   HPI:   Chief Complaint: OBESITY Mindy Collins is here to discuss her progress with her obesity treatment plan. She is on the Category 3 plan and is following her eating plan approximately 0 % of the time. She states she is exercising 0 minutes 0 times per week. Mindy Collins's work is stressful and she has been off track. She is stress eating and she would like to see the same provider each visit. She asked about Optifast and Saxenda.  Her weight is 276 lb (125.2 kg) today and has had a weight loss of 1 pound over a period of 4 weeks since her last visit. She has lost 4 lbs since starting treatment with [place].  Hyperlipidemia Mindy Collins has hyperlipidemia and has been trying to improve her cholesterol levels with intensive lifestyle modification including a low saturated fat diet, exercise, and weight loss. She is not on a statin and denies any chest pain, shortness of breath, or claudication.  Pre-Diabetes Mindy Collins has a diagnosis of pre-diabetes based on her elevated Hgb A1c and was informed this puts her at greater risk of developing diabetes. She is not taking metformin currently and continues to work on diet and exercise to decrease risk of diabetes. She denies polyphagia or hypoglycemia.  At risk for diabetes October is at higher than average risk for developing diabetes due to her pre-diabetes and obesity. She currently denies polyuria or polydipsia.  Hypertension Mindy Collins is a 47 y.o. female with hypertension. She is working on weight loss to help control her blood pressure with the goal of decreasing her risk of heart attack and stroke. Kinzlie's blood pressure is well controlled today. Mindy Collins denies chest pain, headache, or shortness of breath on exertion.  Vitamin D deficiency Mindy Collins has a diagnosis of vitamin D deficiency. She is currently taking vit D and is stable, but is not at goal. She denies nausea, vomiting, or muscle weakness.  Depression with emotional  eating behaviors Mindy Collins is struggling with emotional eating and using food for comfort to the extent that it is negatively impacting her health. She often snacks when she is not hungry. Mindy Collins sometimes feels she is out of control and then feels guilty that she made poor food choices. Stress eating has been a problem recently for her. We discussed meeting with Dr. Mallie Mussel, but Mindy Collins will not cover as of yet. We discussed Wellbutrin, but she does not want to start at this point.  ALLERGIES: Allergies  Allergen Reactions  . Esomeprazole Magnesium Shortness Of Breath    Nervousness; tolerates Aciphex.  . Adhesive [Tape] Itching and Rash    Itching and rash with adhesive tape  . Latex Itching and Dermatitis    Skin blistering  . Levocetirizine Dihydrochloride Other (See Comments)    Muscle jerks  . Nexium [Esomeprazole]     MEDICATIONS: Current Outpatient Medications on File Prior to Visit  Medication Sig Dispense Refill  . cyclobenzaprine (FLEXERIL) 10 MG tablet TAKE 1 TABLET BY MOUTH THREE TIMES A DAY IF NEEDED FOR MUSCLE SPASM 30 tablet 1  . fluticasone (FLONASE) 50 MCG/ACT nasal spray instill 1 spray into each nostril twice a day  0  . levonorgestrel (MIRENA) 20 MCG/24HR IUD 1 each by Intrauterine route once.    . loratadine (CLARITIN) 10 MG tablet Take 10 mg by mouth daily.    . meloxicam (MOBIC) 15 MG tablet take 1 tablet by mouth daily if needed  0  . olmesartan-hydrochlorothiazide (BENICAR HCT) 20-12.5  MG tablet Take 1 tablet by mouth daily. 90 tablet 1  . vitamin B-12 (CYANOCOBALAMIN) 1000 MCG tablet Take 1,000 mcg by mouth daily.     No current facility-administered medications on file prior to visit.     PAST MEDICAL HISTORY: Past Medical History:  Diagnosis Date  . Abdominal wall seroma   . Anemia   . Arthritis   . Asthma   . B12 deficiency   . Back pain   . Carpal tunnel syndrome, bilateral   . Chest pain   . Colitis   . Constipation   . Dysmenorrhea   . Dyspnea   .  Fatty liver   . GERD (gastroesophageal reflux disease)   . HLD (hyperlipidemia)   . HPV (human papilloma virus) infection    WARTS/ TCA [place]  . Hypertension   . Keratoconus   . Leg edema   . Obesity   . Obesity   . Osteoarthritis   . Palpitations   . Prediabetes   . Seasonal allergies     PAST SURGICAL HISTORY: Past Surgical History:  Procedure Laterality Date  . GYNECOLOGIC CRYOSURGERY  1993   DYSPLASIA CERVIX/   . MYOMECTOMY  02/17/2011   Procedure: MYOMECTOMY;  Surgeon: Terrance Mass, MD;  Location: [facility];  Service: Gynecology;  Laterality: N/A;  I am hoping to get a 7:30am time on Dec 4, Dec 11 or Dec 12. It not available I will check on some 1:00 pm times. Thanks  . MYOMECTOMY ABDOMINAL APPROACH  04/31/2007  . OVARIAN CYST REMOVAL  02/17/2011   Procedure: OVARIAN CYSTECTOMY;  Surgeon: Terrance Mass, MD;  Location: [facility];  Service: Gynecology;  Laterality: Left;    SOCIAL HISTORY: Social History   Tobacco Use  . Smoking status: Never Smoker  . Smokeless tobacco: Never Used  . Tobacco comment: denies tobacco   Substance Use Topics  . Alcohol use: No    Alcohol/week: 0.0 standard drinks  . Drug use: No    FAMILY HISTORY: Family History  Problem Relation Age of Onset  . Hypertension Mother   . Breast cancer Mother   . Cancer Mother   . Hyperlipidemia Mother   . Thyroid disease Mother   . Obesity Mother   . Heart disease Maternal Grandmother   . Cancer Father        LUNG  . Breast cancer Maternal Aunt   . Diabetes Maternal Grandfather   . Cancer Paternal Aunt   . Ovarian cancer Paternal Aunt     ROS: Review of Systems  Constitutional: Positive for weight loss.  Respiratory: Negative for shortness of breath.   Cardiovascular: Negative for chest pain and claudication.  Gastrointestinal: Negative for nausea and vomiting.  Musculoskeletal:       Negative for muscle weakness.  Neurological: Negative for headaches.  Endo/Heme/Allergies:       Negative  for polyphagia. Negative for hypoglycemia.  Psychiatric/Behavioral: Positive for depression.    PHYSICAL EXAM: Blood pressure 122/83, pulse 84, temperature 98 F (36.7 C), temperature source Oral, height 5\' 2"  (1.575 m), weight 276 lb (125.2 kg), SpO2 99 %. Body mass index is 50.48 kg/m. Physical Exam  Constitutional: She is oriented to person, place, and time. She appears well-developed and well-nourished.  Cardiovascular: Normal rate.  Pulmonary/Chest: Effort normal.  Musculoskeletal: Normal range of motion.  Neurological: She is oriented to person, place, and time.  Skin: Skin is warm and dry.  Psychiatric: She has a normal mood and affect. Her behavior is  normal.  Vitals reviewed.   RECENT LABS AND TESTS: BMET    Component Value Date/Time   NA 146 (H) 12/21/2017 0936   K 4.4 12/21/2017 0936   CL 102 12/21/2017 0936   CO2 27 12/21/2017 0936   GLUCOSE 91 12/21/2017 0936   GLUCOSE 99 02/22/2016 1206   BUN 11 12/21/2017 0936   CREATININE 0.83 12/21/2017 0936   CREATININE 0.75 02/22/2016 1206   CALCIUM 9.7 12/21/2017 0936   GFRNONAA 85 12/21/2017 0936   GFRAA 98 12/21/2017 0936   Lab Results  Component Value Date   HGBA1C 5.9 (H) 12/21/2017   HGBA1C 6.0 (H) 09/07/2017   HGBA1C 5.9 (H) 03/05/2017   HGBA1C 6.0 (H) 09/27/2016   HGBA1C 5.7 (H) 01/21/2014   Lab Results  Component Value Date   INSULIN 32.3 (H) 12/21/2017   INSULIN 14.8 09/07/2017   CBC    Component Value Date/Time   WBC 12.0 (H) 12/21/2017 0936   WBC 9.3 02/22/2016 1206   RBC 5.17 12/21/2017 0936   RBC 4.84 02/22/2016 1206   HGB 13.7 12/21/2017 0936   HGB 11.5 (L) 02/14/2011 1218   HCT 42.0 12/21/2017 0936   HCT 35.4 02/14/2011 1218   PLT 417 (H) 03/05/2017 1754   MCV 81 12/21/2017 0936   MCV 83.2 02/14/2011 1218   MCH 26.5 (L) 12/21/2017 0936   MCH 26.4 (L) 02/22/2016 1206   MCHC 32.6 12/21/2017 0936   MCHC 31.7 (L) 02/22/2016 1206   RDW 13.4 12/21/2017 0936   RDW 15.2 (H) 02/14/2011  1218   LYMPHSABS 2.2 12/21/2017 0936   LYMPHSABS 1.7 02/14/2011 1218   MONOABS 651 02/22/2016 1206   MONOABS 0.8 02/14/2011 1218   EOSABS 0.1 12/21/2017 0936   BASOSABS 0.1 12/21/2017 0936   BASOSABS 0.1 02/14/2011 1218   Iron/TIBC/Ferritin/ %Sat No results found for: IRON, TIBC, FERRITIN, IRONPCTSAT Lipid Panel     Component Value Date/Time   CHOL 242 (H) 12/21/2017 0936   TRIG 124 12/21/2017 0936   HDL 48 12/21/2017 0936   CHOLHDL 4.6 (H) 03/05/2017 1754   CHOLHDL 4.8 02/22/2016 1206   VLDL 21 02/22/2016 1206   LDLCALC 169 (H) 12/21/2017 0936   LDLDIRECT 141.6 04/16/2010 0743   Hepatic Function Panel     Component Value Date/Time   PROT 7.1 12/21/2017 0936   ALBUMIN 4.2 12/21/2017 0936   AST 29 12/21/2017 0936   ALT 35 (H) 12/21/2017 0936   ALKPHOS 127 (H) 12/21/2017 0936   BILITOT 0.5 12/21/2017 0936      Component Value Date/Time   TSH 3.190 09/07/2017 0917   TSH 2.190 03/05/2017 1754   TSH 1.72 02/22/2016 1206   Results for Mindy Collins, Mindy Collins (MRN [id number]) as of 12/22/2017 11:15  Ref. Range 12/21/2017 09:36  Vitamin D, 25-Hydroxy Latest Ref Range: 30.0 - 100.0 ng/mL 13.7 (L)   ASSESSMENT AND PLAN: Other hyperlipidemia - Plan: Lipid Panel With LDL/HDL Ratio  Prediabetes - Plan: CBC With Differential, Comprehensive metabolic panel, Hemoglobin A1c, Insulin, random  Essential hypertension - Plan: CBC With Differential  Vitamin D deficiency - Plan: VITAMIN D 25 Hydroxy (Vit-D Deficiency, Fractures), Vitamin D, Ergocalciferol, (DRISDOL) 1.25 MG (50000 UT) CAPS capsule  Other depression - with emotional eating  At risk for diabetes mellitus  Class 3 severe obesity with serious comorbidity and body mass index (BMI) of 50.0 to 59.9 in adult, unspecified obesity type ([place])  PLAN:  Hyperlipidemia Hampton was informed of the American Heart Association Guidelines emphasizing intensive lifestyle modifications as the  first line treatment for hyperlipidemia. We discussed  many lifestyle modifications today in depth, and Araseli will continue to work on decreasing saturated fats such as fatty red meat, butter and many fried foods. She will also increase vegetables and lean protein in her diet and continue to work on exercise and weight loss efforts. We will check a FLP today. Mindy Collins agrees to continue with her diet and follow up in 3 weeks.  Pre-Diabetes Mindy Collins will continue to work on weight loss, exercise, and decreasing simple carbohydrates in her diet to help decrease the risk of diabetes. We discussed metformin and Victoza including benefits and risks. She was informed that eating too many simple carbohydrates or too many calories at one sitting increases the likelihood of GI side effects. Mindy Collins wants to defer metformin and Victoza for now pending lab results. We will recheck her fasting Insulin and A1c today. Mindy Collins agreed to follow up with [place] as directed to monitor her progress.  Diabetes risk counseling Mindy Collins was given extended (15 minutes) diabetes prevention counseling today. She is 47 y.o. female and has risk factors for diabetes including pre-diabetes and obesity. We discussed intensive lifestyle modifications today with an emphasis on weight loss as well as increasing exercise and decreasing simple carbohydrates in her diet.  Hypertension We discussed sodium restriction, working on healthy weight loss, and a regular exercise program as the means to achieve improved blood pressure control. We will continue to monitor her blood pressure as well as her progress with the above lifestyle modifications. She will continue her diet and antihypertensive medications as prescribed and will watch for signs of hypotension as she continues her lifestyle modifications. Mindy Collins agreed with this plan and agreed to follow up as directed.  Vitamin D Deficiency Mindy Collins was informed that low vitamin D levels contributes to fatigue and are associated with obesity, breast, and colon cancer.  She agrees to continue to take prescription Vit D @50 ,000 IU every week #4 with no refills and will follow up for routine testing of vitamin D, at least 2-3 times per year. She was informed of the risk of over-replacement of vitamin D and agrees to not increase her dose unless she discusses this with [place] first. Mindy Collins agrees to follow up as directed in 3 weeks. We will check her vitamin D level today.  Depression with Emotional Eating Behaviors We discussed behavior modification techniques and emotional eating strategies today to help Mindy Collins deal with her emotional eating and depression. She wants to defer Wellbutrin for now and has agreed to follow up as directed.  Obesity Mindy Collins is currently in the action stage of change. As such, her goal is to continue with weight loss efforts. She has agreed to follow the Category 3 plan. We discussed disadvantages of Optifast for long term success with weight loss. An informational handout for Emotional Eating was provided. Mindy Collins has not been prescribed exercise for now.  We discussed the following Behavioral Modification Strategies today: increasing lean protein intake, increase H2O intake, emotional eating strategies, and planning for success.  Mindy Collins has agreed to follow up with our clinic in 3 weeks. She was informed of the importance of frequent follow up visits to maximize her success with intensive lifestyle modifications for her multiple health conditions.   OBESITY BEHAVIORAL INTERVENTION VISIT  Today's visit was # 6   Starting weight: 280 lbs Starting date: 09/07/17 Today's weight : Weight: 276 lb (125.2 kg)  Today's date: 12/21/2017 Total lbs lost to date: 4  ASK: We discussed  the diagnosis of obesity with Mindy Collins today and Mindy Collins agreed to give [place] permission to discuss obesity behavioral modification therapy today.  ASSESS: Myleka has the diagnosis of obesity and her BMI today is 50.47. Sharla is in the action stage of change.    ADVISE: Sister was educated on the multiple health risks of obesity as well as the benefit of weight loss to improve her health. She was advised of the need for long term treatment and the importance of lifestyle modifications to improve her current health and to decrease her risk of future health problems.  AGREE: Multiple dietary modification options and treatment options were discussed and Mindy Collins agreed to follow the recommendations documented in the above note.  ARRANGE: [place] was educated on the importance of frequent visits to treat obesity as outlined per CMS and USPSTF guidelines and agreed to schedule her next follow up appointment today.  Lenward Chancellor, am acting as Location manager for Georgianne Fick, FNP.  I have reviewed the above documentation for accuracy and completeness, and I agree with the above.  - Antoniette Peake, FNP-C.

## 2017-12-23 ENCOUNTER — Encounter (INDEPENDENT_AMBULATORY_CARE_PROVIDER_SITE_OTHER): Payer: Self-pay | Admitting: Family Medicine

## 2018-01-13 ENCOUNTER — Ambulatory Visit (INDEPENDENT_AMBULATORY_CARE_PROVIDER_SITE_OTHER): Payer: 59 | Admitting: Family Medicine

## 2018-01-13 VITALS — BP 135/83 | HR 89 | Temp 97.8°F | Ht 62.0 in | Wt 279.0 lb

## 2018-01-13 DIAGNOSIS — Z9189 Other specified personal risk factors, not elsewhere classified: Secondary | ICD-10-CM | POA: Diagnosis not present

## 2018-01-13 DIAGNOSIS — R7303 Prediabetes: Secondary | ICD-10-CM

## 2018-01-13 DIAGNOSIS — E559 Vitamin D deficiency, unspecified: Secondary | ICD-10-CM

## 2018-01-13 DIAGNOSIS — Z6841 Body Mass Index (BMI) 40.0 and over, adult: Secondary | ICD-10-CM

## 2018-01-13 MED ORDER — METFORMIN HCL 500 MG PO TABS: 500.0000 mg | ORAL_TABLET | Freq: Every day | ORAL | 0 refills | Status: DC

## 2018-01-15 NOTE — Progress Notes (Signed)
Office: [phone]  /  Fax: [phone]   HPI:   Chief Complaint: OBESITY Mindy Collins is here to discuss her progress with her obesity treatment plan. She is on the  follow the Category 3 plan and is following her eating plan approximately 10 % of the time. She states she is exercising 0 minutes 0 times per week. Mindy Collins got off track over the thanksgiving holiday and had an increase in celebration eating. She noted an increase in temptations, and increase in stress eating. She is ready to get back on track and lose the 3 lbs before christmas.  Her weight is 279 lb (126.6 kg) today and gained 3 lbs since her last visit. She has lost 4 lbs since starting treatment with [place].  Pre-Diabetes Mindy Collins has a diagnosis of prediabetes based on her elevated HgA1c and was informed this puts her at greater risk of developing diabetes. She is not taking metformin currently and continues to work on diet and exercise to decrease risk of diabetes. She denies nausea or hypoglycemia. Patient struggles with increase in polyphagia especially after increase in simple carbs. She would like to reconsider starting Metformin.   Vitamin D deficiency Mindy Collins has a diagnosis of vitamin D deficiency. She is currently taking vit D and denies nausea, vomiting or muscle weakness.Her level is slowly improving.   At risk for diabetes Mindy Collins is at higher than averagerisk for developing diabetes due to her obesity. She currently denies polyuria or polydipsia.   ALLERGIES: Allergies  Allergen Reactions  . Esomeprazole Magnesium Shortness Of Breath    Nervousness; tolerates Aciphex.  . Adhesive [Tape] Itching and Rash    Itching and rash with adhesive tape  . Latex Itching and Dermatitis    Skin blistering  . Levocetirizine Dihydrochloride Other (See Comments)    Muscle jerks  . Nexium [Esomeprazole]     MEDICATIONS: Current Outpatient Medications on File Prior to Visit  Medication Sig Dispense Refill  . cyclobenzaprine  (FLEXERIL) 10 MG tablet TAKE 1 TABLET BY MOUTH THREE TIMES A DAY IF NEEDED FOR MUSCLE SPASM 30 tablet 1  . fluticasone (FLONASE) 50 MCG/ACT nasal spray instill 1 spray into each nostril twice a day  0  . levonorgestrel (MIRENA) 20 MCG/24HR IUD 1 each by Intrauterine route once.    . loratadine (CLARITIN) 10 MG tablet Take 10 mg by mouth daily.    . meloxicam (MOBIC) 15 MG tablet take 1 tablet by mouth daily if needed  0  . olmesartan-hydrochlorothiazide (BENICAR HCT) 20-12.5 MG tablet Take 1 tablet by mouth daily. 90 tablet 1  . vitamin B-12 (CYANOCOBALAMIN) 1000 MCG tablet Take 1,000 mcg by mouth daily.    . Vitamin D, Ergocalciferol, (DRISDOL) 1.25 MG (50000 UT) CAPS capsule Take 1 capsule (50,000 Units total) by mouth every 7 (seven) days. 4 capsule 0   No current facility-administered medications on file prior to visit.     PAST MEDICAL HISTORY: Past Medical History:  Diagnosis Date  . Abdominal wall seroma   . Anemia   . Arthritis   . Asthma   . B12 deficiency   . Back pain   . Carpal tunnel syndrome, bilateral   . Chest pain   . Colitis   . Constipation   . Dysmenorrhea   . Dyspnea   . Fatty liver   . GERD (gastroesophageal reflux disease)   . HLD (hyperlipidemia)   . HPV (human papilloma virus) infection    WARTS/ TCA [place]  . Hypertension   .  Keratoconus   . Leg edema   . Obesity   . Obesity   . Osteoarthritis   . Palpitations   . Prediabetes   . Seasonal allergies     PAST SURGICAL HISTORY: Past Surgical History:  Procedure Laterality Date  . GYNECOLOGIC CRYOSURGERY  1993   DYSPLASIA CERVIX/   . MYOMECTOMY  02/17/2011   Procedure: MYOMECTOMY;  Surgeon: Terrance Mass, MD;  Location: [facility];  Service: Gynecology;  Laterality: N/A;  I am hoping to get a 7:30am time on Dec 4, Dec 11 or Dec 12. It not available I will check on some 1:00 pm times. Thanks  . MYOMECTOMY ABDOMINAL APPROACH  04/31/2007  . OVARIAN CYST REMOVAL  02/17/2011   Procedure: OVARIAN CYSTECTOMY;   Surgeon: Terrance Mass, MD;  Location: [facility];  Service: Gynecology;  Laterality: Left;    SOCIAL HISTORY: Social History   Tobacco Use  . Smoking status: Never Smoker  . Smokeless tobacco: Never Used  . Tobacco comment: denies tobacco   Substance Use Topics  . Alcohol use: No    Alcohol/week: 0.0 standard drinks  . Drug use: No    FAMILY HISTORY: Family History  Problem Relation Age of Onset  . Hypertension Mother   . Breast cancer Mother   . Cancer Mother   . Hyperlipidemia Mother   . Thyroid disease Mother   . Obesity Mother   . Heart disease Maternal Grandmother   . Cancer Father        LUNG  . Breast cancer Maternal Aunt   . Diabetes Maternal Grandfather   . Cancer Paternal Aunt   . Ovarian cancer Paternal Aunt     ROS: Review of Systems  Endo/Heme/Allergies:       Polyphagia  All other systems reviewed and are negative.   PHYSICAL EXAM: Blood pressure 135/83, pulse 89, temperature 97.8 F (36.6 C), temperature source Oral, height 5\' 2"  (1.575 m), weight 279 lb (126.6 kg), SpO2 98 %. Body mass index is 51.03 kg/m. Physical Exam  Constitutional: She is oriented to person, place, and time. She appears well-developed.  HENT:  Head: Normocephalic.  Eyes: EOM are normal.  Neck: Normal range of motion.  Pulmonary/Chest: Effort normal.  Abdominal: Soft.  Musculoskeletal: Normal range of motion.  Neurological: She is alert and oriented to person, place, and time.  Skin: Skin is warm and dry.  Psychiatric: She has a normal mood and affect.  Vitals reviewed.   RECENT LABS AND TESTS: BMET    Component Value Date/Time   NA 146 (H) 12/21/2017 0936   K 4.4 12/21/2017 0936   CL 102 12/21/2017 0936   CO2 27 12/21/2017 0936   GLUCOSE 91 12/21/2017 0936   GLUCOSE 99 02/22/2016 1206   BUN 11 12/21/2017 0936   CREATININE 0.83 12/21/2017 0936   CREATININE 0.75 02/22/2016 1206   CALCIUM 9.7 12/21/2017 0936   GFRNONAA 85 12/21/2017 0936   GFRAA 98  12/21/2017 0936   Lab Results  Component Value Date   HGBA1C 5.9 (H) 12/21/2017   HGBA1C 6.0 (H) 09/07/2017   HGBA1C 5.9 (H) 03/05/2017   HGBA1C 6.0 (H) 09/27/2016   HGBA1C 5.7 (H) 01/21/2014   Lab Results  Component Value Date   INSULIN 32.3 (H) 12/21/2017   INSULIN 14.8 09/07/2017   CBC    Component Value Date/Time   WBC 12.0 (H) 12/21/2017 0936   WBC 9.3 02/22/2016 1206   RBC 5.17 12/21/2017 0936   RBC 4.84 02/22/2016 1206  HGB 13.7 12/21/2017 0936   HGB 11.5 (L) 02/14/2011 1218   HCT 42.0 12/21/2017 0936   HCT 35.4 02/14/2011 1218   PLT 417 (H) 03/05/2017 1754   MCV 81 12/21/2017 0936   MCV 83.2 02/14/2011 1218   MCH 26.5 (L) 12/21/2017 0936   MCH 26.4 (L) 02/22/2016 1206   MCHC 32.6 12/21/2017 0936   MCHC 31.7 (L) 02/22/2016 1206   RDW 13.4 12/21/2017 0936   RDW 15.2 (H) 02/14/2011 1218   LYMPHSABS 2.2 12/21/2017 0936   LYMPHSABS 1.7 02/14/2011 1218   MONOABS 651 02/22/2016 1206   MONOABS 0.8 02/14/2011 1218   EOSABS 0.1 12/21/2017 0936   BASOSABS 0.1 12/21/2017 0936   BASOSABS 0.1 02/14/2011 1218   Iron/TIBC/Ferritin/ %Sat No results found for: IRON, TIBC, FERRITIN, IRONPCTSAT Lipid Panel     Component Value Date/Time   CHOL 242 (H) 12/21/2017 0936   TRIG 124 12/21/2017 0936   HDL 48 12/21/2017 0936   CHOLHDL 4.6 (H) 03/05/2017 1754   CHOLHDL 4.8 02/22/2016 1206   VLDL 21 02/22/2016 1206   LDLCALC 169 (H) 12/21/2017 0936   LDLDIRECT 141.6 04/16/2010 0743   Hepatic Function Panel     Component Value Date/Time   PROT 7.1 12/21/2017 0936   ALBUMIN 4.2 12/21/2017 0936   AST 29 12/21/2017 0936   ALT 35 (H) 12/21/2017 0936   ALKPHOS 127 (H) 12/21/2017 0936   BILITOT 0.5 12/21/2017 0936      Component Value Date/Time   TSH 3.190 09/07/2017 0917   TSH 2.190 03/05/2017 1754   TSH 1.72 02/22/2016 1206    ASSESSMENT AND PLAN: Prediabetes - Plan: metFORMIN (GLUCOPHAGE) 500 MG tablet  Vitamin D deficiency  At risk for diabetes mellitus  Class  3 severe obesity with serious comorbidity and body mass index (BMI) of 50.0 to 59.9 in adult, unspecified obesity type ([place])  PLAN: Pre-Diabetes Mindy Collins will continue to work on weight loss, exercise, and decreasing simple carbohydrates in her diet to help decrease the risk of diabetes. We dicussed metformin including benefits and risks. She was informed that eating too many simple carbohydrates or too many calories at one sitting increases the likelihood of GI side effects. Mindy Collins requested metformin for now and a prescription was written today. Mindy Collins agreed to follow up with [place] as directed to monitor her progress.  Vitamin D Deficiency Mindy Collins was informed that low vitamin D levels contributes to fatigue and are associated with obesity, breast, and colon cancer. She agrees to continue to take prescription Vit D @50 ,000 IU every week and will follow up for routine testing of vitamin D, at least 2-3 times per year. She was informed of the risk of over-replacement of vitamin D and agrees to not increase her dose unless she discusses this with [place] first.  Diabetes risk counselling Mindy Collins was given extended (15 minutes) diabetes prevention counseling today. She is 47 y.o. female and has risk factors for diabetes including obesity. We discussed intensive lifestyle modifications today with an emphasis on weight loss as well as increasing exercise and decreasing simple carbohydrates in her diet.  Obesity  Mindy Collins is currently in the action stage of change. As such, her goal is to continue with weight loss efforts She has agreed to follow the Category 3 plan Mindy Collins has been instructed to work up to a goal of 150 minutes of combined cardio and strengthening exercise per week for weight loss and overall health benefits. We discussed the following Behavioral Modification Stratagies today: increasing lean protein  intake, decreasing simple carbohydrates , work on meal planning and easy cooking plans and holiday eating  strategies  and planning for success.    Mindy Collins has agreed to follow up with our clinic in 2 weeks. She was informed of the importance of frequent follow up visits to maximize her success with intensive lifestyle modifications for her multiple health conditions.   OBESITY BEHAVIORAL INTERVENTION VISIT  Today's visit was # 7   Starting weight: 280lbs  Starting date: 09/07/17 Today's weight : Weight: 279 lb (126.6 kg)  Today's date: 01/13/18 Total lbs lost to date: 4    ASK: We discussed the diagnosis of obesity with Mindy Collins today and Jamesa agreed to give [place] permission to discuss obesity behavioral modification therapy today.  ASSESS: Anajah has the diagnosis of obesity and her BMI today is 51.2 Flavia is in the action stage of change   ADVISE: Evalie was educated on the multiple health risks of obesity as well as the benefit of weight loss to improve her health. She was advised of the need for long term treatment and the importance of lifestyle modifications to improve her current health and to decrease her risk of future health problems.  AGREE: Multiple dietary modification options and treatment options were discussed and  Bless agreed to follow the recommendations documented in the above note.  ARRANGE: Samah was educated on the importance of frequent visits to treat obesity as outlined per CMS and USPSTF guidelines and agreed to schedule her next follow up appointment today.  I, April Moore, am acting as Location manager for Dennard Nip, MD.   I have reviewed the above documentation for accuracy and completeness, and I agree with the above. -Dennard Nip, MD

## 2018-01-18 ENCOUNTER — Encounter (INDEPENDENT_AMBULATORY_CARE_PROVIDER_SITE_OTHER): Payer: Self-pay | Admitting: Family Medicine

## 2018-01-27 ENCOUNTER — Ambulatory Visit (INDEPENDENT_AMBULATORY_CARE_PROVIDER_SITE_OTHER): Payer: 59 | Admitting: Family Medicine

## 2018-01-27 ENCOUNTER — Encounter (INDEPENDENT_AMBULATORY_CARE_PROVIDER_SITE_OTHER): Payer: Self-pay | Admitting: Family Medicine

## 2018-01-27 VITALS — BP 127/84 | HR 86 | Temp 97.7°F | Ht 62.0 in | Wt 278.0 lb

## 2018-01-27 DIAGNOSIS — R7303 Prediabetes: Secondary | ICD-10-CM

## 2018-01-27 DIAGNOSIS — Z9189 Other specified personal risk factors, not elsewhere classified: Secondary | ICD-10-CM | POA: Diagnosis not present

## 2018-01-27 DIAGNOSIS — E559 Vitamin D deficiency, unspecified: Secondary | ICD-10-CM

## 2018-01-27 DIAGNOSIS — Z6841 Body Mass Index (BMI) 40.0 and over, adult: Secondary | ICD-10-CM

## 2018-01-27 DIAGNOSIS — E66813 Obesity, class 3: Secondary | ICD-10-CM

## 2018-01-27 MED ORDER — VITAMIN D (ERGOCALCIFEROL) 1.25 MG (50000 UNIT) PO CAPS: 50000.0000 [IU] | ORAL_CAPSULE | ORAL | 0 refills | Status: DC

## 2018-01-27 MED ORDER — METFORMIN HCL 500 MG PO TABS: 500.0000 mg | ORAL_TABLET | Freq: Every day | ORAL | 0 refills | Status: DC

## 2018-01-27 NOTE — Progress Notes (Signed)
Office: [phone]  /  Fax: [phone]   HPI:   Chief Complaint: OBESITY Mindy Collins is here to discuss her progress with her obesity treatment plan. She is on the Category 3 plan and is following her eating plan approximately 40 % of the time. She states she is exercising 0 minutes 0 times per week. Mindy Collins hasn't been able to follow her plan closely, but has done well doing portion control and smart choices. She is working on [employer] protein and vegetables. Her hunger is controlled.  Her weight is 278 lb (126.1 kg) today and has had a weight loss of 1 pound over a period of 2 weeks since her last visit. She has lost 2 lbs since starting treatment with [place].  Pre-Diabetes Mindy Collins has a diagnosis of pre-diabetes based on her elevated Hgb A1c and was informed this puts her at greater risk of developing diabetes. She is stable on metformin and denies GI upset when following her plan, but notes diarrhea when her fat intake is increased. She continues to work on diet and exercise to decrease risk of diabetes. She is doing well on her diet.  Vitamin D deficiency Mindy Collins has a diagnosis of vitamin D deficiency. She is currently stable on vit D, but is not yet at goal. She denies nausea, vomiting, or muscle weakness.  At risk for osteopenia and osteoporosis Mindy Collins is at higher risk of osteopenia and osteoporosis due to vitamin D deficiency.   ASSESSMENT AND PLAN:  Prediabetes - Plan: metFORMIN (GLUCOPHAGE) 500 MG tablet  Vitamin D deficiency - Plan: Vitamin D, Ergocalciferol, (DRISDOL) 1.25 MG (50000 UT) CAPS capsule  At risk for diabetes mellitus  Class 3 severe obesity with serious comorbidity and body mass index (BMI) of 50.0 to 59.9 in adult, unspecified obesity type ([place])  PLAN:  Pre-Diabetes Mindy Collins will continue to work on weight loss, exercise, and decreasing simple carbohydrates in her diet to help decrease the risk of diabetes. She was informed that eating too many simple  carbohydrates or too many calories at one sitting increases the likelihood of GI side effects. Mindy Collins agreed to continue taking metformin 500mg  qd with no refills and a prescription was written today. Mindy Collins agreed to follow up with [place] as directed to monitor her progress in 3 to 4 weeks.  Vitamin D Deficiency Mindy Collins was informed that low vitamin D levels contributes to fatigue and are associated with obesity, breast, and colon cancer. She agrees to continue to take prescription Vit D @50 ,000 IU every week #4 with no refills and will follow up for routine testing of vitamin D, at least 2-3 times per year. She was informed of the risk of over-replacement of vitamin D and agrees to not increase her dose unless she discusses this with [place] first. Mindy Collins agreed to follow up as directed.  At risk for osteopenia and osteoporosis Mindy Collins was given extended (15 minutes) osteoporosis prevention counseling today. Mindy Collins is at risk for osteopenia and osteoporosis due to her vitamin D deficiency. She was encouraged to take her vitamin D and follow her higher calcium diet and increase strengthening exercise to help strengthen her bones and decrease her risk of osteopenia and osteoporosis.  Obesity Mindy Collins is currently in the action stage of change. As such, her goal is to continue with weight loss efforts. She has agreed to follow the Category 3 plan. Mindy Collins has been instructed to work up to a goal of 150 minutes of combined cardio and strengthening exercise per week for weight loss  and overall health benefits. We discussed the following Behavioral Modification Strategies today: increasing lean protein intake, decreasing simple carbohydrates, decrease eating out, better snacking choices, work on meal planning and easy cooking plans, holiday eating strategies, and celebration eating strategies.  Mindy Collins has agreed to follow up with our clinic in 3 to 4 weeks. She was informed of the importance of frequent follow up visits to  maximize her success with intensive lifestyle modifications for her multiple health conditions.  ALLERGIES: Allergies  Allergen Reactions  . Esomeprazole Magnesium Shortness Of Breath    Nervousness; tolerates Aciphex.  . Adhesive [Tape] Itching and Rash    Itching and rash with adhesive tape  . Latex Itching and Dermatitis    Skin blistering  . Levocetirizine Dihydrochloride Other (See Comments)    Muscle jerks  . Nexium [Esomeprazole]     MEDICATIONS: Current Outpatient Medications on File Prior to Visit  Medication Sig Dispense Refill  . cyclobenzaprine (FLEXERIL) 10 MG tablet TAKE 1 TABLET BY MOUTH THREE TIMES A DAY IF NEEDED FOR MUSCLE SPASM 30 tablet 1  . fluticasone (FLONASE) 50 MCG/ACT nasal spray instill 1 spray into each nostril twice a day  0  . levonorgestrel (MIRENA) 20 MCG/24HR IUD 1 each by Intrauterine route once.    . loratadine (CLARITIN) 10 MG tablet Take 10 mg by mouth daily.    . meloxicam (MOBIC) 15 MG tablet take 1 tablet by mouth daily if needed  0  . olmesartan-hydrochlorothiazide (BENICAR HCT) 20-12.5 MG tablet Take 1 tablet by mouth daily. 90 tablet 1  . vitamin B-12 (CYANOCOBALAMIN) 1000 MCG tablet Take 1,000 mcg by mouth daily.     No current facility-administered medications on file prior to visit.     PAST MEDICAL HISTORY: Past Medical History:  Diagnosis Date  . Abdominal wall seroma   . Anemia   . Arthritis   . Asthma   . B12 deficiency   . Back pain   . Carpal tunnel syndrome, bilateral   . Chest pain   . Colitis   . Constipation   . Dysmenorrhea   . Dyspnea   . Fatty liver   . GERD (gastroesophageal reflux disease)   . HLD (hyperlipidemia)   . HPV (human papilloma virus) infection    WARTS/ TCA [place]  . Hypertension   . Keratoconus   . Leg edema   . Obesity   . Obesity   . Osteoarthritis   . Palpitations   . Prediabetes   . Seasonal allergies     PAST SURGICAL HISTORY: Past Surgical History:  Procedure Laterality Date  .  GYNECOLOGIC CRYOSURGERY  1993   DYSPLASIA CERVIX/   . MYOMECTOMY  02/17/2011   Procedure: MYOMECTOMY;  Surgeon: Terrance Mass, MD;  Location: [facility];  Service: Gynecology;  Laterality: N/A;  I am hoping to get a 7:30am time on Dec 4, Dec 11 or Dec 12. It not available I will check on some 1:00 pm times. Thanks  . MYOMECTOMY ABDOMINAL APPROACH  04/31/2007  . OVARIAN CYST REMOVAL  02/17/2011   Procedure: OVARIAN CYSTECTOMY;  Surgeon: Terrance Mass, MD;  Location: [facility];  Service: Gynecology;  Laterality: Left;    SOCIAL HISTORY: Social History   Tobacco Use  . Smoking status: Never Smoker  . Smokeless tobacco: Never Used  . Tobacco comment: denies tobacco   Substance Use Topics  . Alcohol use: No    Alcohol/week: 0.0 standard drinks  . Drug use: No    FAMILY  HISTORY: Family History  Problem Relation Age of Onset  . Hypertension Mother   . Breast cancer Mother   . Cancer Mother   . Hyperlipidemia Mother   . Thyroid disease Mother   . Obesity Mother   . Heart disease Maternal Grandmother   . Cancer Father        LUNG  . Breast cancer Maternal Aunt   . Diabetes Maternal Grandfather   . Cancer Paternal Aunt   . Ovarian cancer Paternal Aunt     ROS: Review of Systems  Constitutional: Positive for weight loss.  Gastrointestinal: Positive for diarrhea. Negative for nausea and vomiting.  Musculoskeletal:       Negative for muscle weakness.   PHYSICAL EXAM: Blood pressure 127/84, pulse 86, temperature 97.7 F (36.5 C), temperature source Oral, height 5\' 2"  (1.245 m), weight 278 lb (126.1 kg), SpO2 99 %. Body mass index is 50.85 kg/m. Physical Exam Vitals signs reviewed.  Constitutional:      Appearance: Normal appearance. She is obese.  Cardiovascular:     Rate and Rhythm: Normal rate.  Pulmonary:     Effort: Pulmonary effort is normal.  Musculoskeletal: Normal range of motion.  Skin:    General: Skin is warm and dry.  Neurological:     Mental Status: She is  alert and oriented to person, place, and time.  Psychiatric:        Mood and Affect: Mood normal.        Behavior: Behavior normal.    RECENT LABS AND TESTS: BMET    Component Value Date/Time   NA 146 (H) 12/21/2017 0936   K 4.4 12/21/2017 0936   CL 102 12/21/2017 0936   CO2 27 12/21/2017 0936   GLUCOSE 91 12/21/2017 0936   GLUCOSE 99 02/22/2016 1206   BUN 11 12/21/2017 0936   CREATININE 0.83 12/21/2017 0936   CREATININE 0.75 02/22/2016 1206   CALCIUM 9.7 12/21/2017 0936   GFRNONAA 85 12/21/2017 0936   GFRAA 98 12/21/2017 0936   Lab Results  Component Value Date   HGBA1C 5.9 (H) 12/21/2017   HGBA1C 6.0 (H) 09/07/2017   HGBA1C 5.9 (H) 03/05/2017   HGBA1C 6.0 (H) 09/27/2016   HGBA1C 5.7 (H) 01/21/2014   Lab Results  Component Value Date   INSULIN 32.3 (H) 12/21/2017   INSULIN 14.8 09/07/2017   CBC    Component Value Date/Time   WBC 12.0 (H) 12/21/2017 0936   WBC 9.3 02/22/2016 1206   RBC 5.17 12/21/2017 0936   RBC 4.84 02/22/2016 1206   HGB 13.7 12/21/2017 0936   HGB 11.5 (L) 02/14/2011 1218   HCT 42.0 12/21/2017 0936   HCT 35.4 02/14/2011 1218   PLT 417 (H) 03/05/2017 1754   MCV 81 12/21/2017 0936   MCV 83.2 02/14/2011 1218   MCH 26.5 (L) 12/21/2017 0936   MCH 26.4 (L) 02/22/2016 1206   MCHC 32.6 12/21/2017 0936   MCHC 31.7 (L) 02/22/2016 1206   RDW 13.4 12/21/2017 0936   RDW 15.2 (H) 02/14/2011 1218   LYMPHSABS 2.2 12/21/2017 0936   LYMPHSABS 1.7 02/14/2011 1218   MONOABS 651 02/22/2016 1206   MONOABS 0.8 02/14/2011 1218   EOSABS 0.1 12/21/2017 0936   BASOSABS 0.1 12/21/2017 0936   BASOSABS 0.1 02/14/2011 1218   Iron/TIBC/Ferritin/ %Sat No results found for: IRON, TIBC, FERRITIN, IRONPCTSAT Lipid Panel     Component Value Date/Time   CHOL 242 (H) 12/21/2017 0936   TRIG 124 12/21/2017 0936   HDL 48 12/21/2017  0936   CHOLHDL 4.6 (H) 03/05/2017 1754   CHOLHDL 4.8 02/22/2016 1206   VLDL 21 02/22/2016 1206   LDLCALC 169 (H) 12/21/2017 0936    LDLDIRECT 141.6 04/16/2010 0743   Hepatic Function Panel     Component Value Date/Time   PROT 7.1 12/21/2017 0936   ALBUMIN 4.2 12/21/2017 0936   AST 29 12/21/2017 0936   ALT 35 (H) 12/21/2017 0936   ALKPHOS 127 (H) 12/21/2017 0936   BILITOT 0.5 12/21/2017 0936      Component Value Date/Time   TSH 3.190 09/07/2017 0917   TSH 2.190 03/05/2017 1754   TSH 1.72 02/22/2016 1206   Results for TANIYA, DASHER (MRN [id number]) as of 01/27/2018 14:38  Ref. Range 12/21/2017 09:36  Vitamin D, 25-Hydroxy Latest Ref Range: 30.0 - 100.0 ng/mL 13.7 (L)    OBESITY BEHAVIORAL INTERVENTION VISIT  Today's visit was # 8   Starting weight: 280 lbs Starting date: 09/07/17 Today's weight : Weight: 278 lb (126.1 kg)  Today's date: 01/27/2018 Total lbs lost to date: 2  ASK: We discussed the diagnosis of obesity with Fernande Bras today and Emeree agreed to give [place] permission to discuss obesity behavioral modification therapy today.  ASSESS: Lamyiah has the diagnosis of obesity and her BMI today is 50.8. Dominque is in the action stage of change.   ADVISE: Alice was educated on the multiple health risks of obesity as well as the benefit of weight loss to improve her health. She was advised of the need for long term treatment and the importance of lifestyle modifications to improve her current health and to decrease her risk of future health problems.  AGREE: Multiple dietary modification options and treatment options were discussed and Prudence agreed to follow the recommendations documented in the above note.  ARRANGE: Mindy Collins was educated on the importance of frequent visits to treat obesity as outlined per CMS and USPSTF guidelines and agreed to schedule her next follow up appointment today.  I, Marcille Blanco, am acting as transcriptionist for Starlyn Skeans, MD I have reviewed the above documentation for accuracy and completeness, and I agree with the above. -Dennard Nip, MD

## 2018-02-22 ENCOUNTER — Ambulatory Visit (INDEPENDENT_AMBULATORY_CARE_PROVIDER_SITE_OTHER): Payer: 59 | Admitting: Family Medicine

## 2018-02-22 ENCOUNTER — Encounter (INDEPENDENT_AMBULATORY_CARE_PROVIDER_SITE_OTHER): Payer: Self-pay | Admitting: Family Medicine

## 2018-02-22 VITALS — BP 128/83 | HR 78 | Temp 98.1°F | Ht 62.0 in | Wt 278.0 lb

## 2018-02-22 DIAGNOSIS — E559 Vitamin D deficiency, unspecified: Secondary | ICD-10-CM | POA: Diagnosis not present

## 2018-02-22 DIAGNOSIS — Z9189 Other specified personal risk factors, not elsewhere classified: Secondary | ICD-10-CM

## 2018-02-22 DIAGNOSIS — Z6841 Body Mass Index (BMI) 40.0 and over, adult: Secondary | ICD-10-CM | POA: Diagnosis not present

## 2018-02-22 MED ORDER — VITAMIN D (ERGOCALCIFEROL) 1.25 MG (50000 UNIT) PO CAPS: 50000.0000 [IU] | ORAL_CAPSULE | ORAL | 0 refills | Status: DC

## 2018-02-23 NOTE — Progress Notes (Signed)
Office: [phone]  /  Fax: [phone]   HPI:   Chief Complaint: OBESITY Mindy Collins is here to discuss her progress with her obesity treatment plan. She is on the Category 3 plan and is following her eating plan approximately 0 % of the time. She states she is exercising 0 minutes 0 times per week. Mindy Collins has done well maintaining her weight over the holidays. She is ready to get back on track but has been deviating from her plan more especially at dinner. Her weight is 278 lb (126.1 kg) today and has had a weight loss of 0  pounds over a period of 4 weeks since her last visit. She has lost 2 lbs since starting treatment with [place].  Vitamin D deficiency Mindy Collins has a diagnosis of vitamin D deficiency. She is currently taking prescription Vit D and denies nausea, vomiting or muscle weakness.  At risk for osteopenia and osteoporosis Mindy Collins is at higher risk of osteopenia and osteoporosis due to vitamin D deficiency.    ASSESSMENT AND PLAN:  Vitamin D deficiency - Plan: Vitamin D, Ergocalciferol, (DRISDOL) 1.25 MG (50000 UT) CAPS capsule  At risk for osteoporosis  Class 3 severe obesity with serious comorbidity and body mass index (BMI) of 50.0 to 59.9 in adult, unspecified obesity type ([place])  PLAN:  Vitamin D Deficiency Mindy Collins was informed that low vitamin D levels contributes to fatigue and are associated with obesity, breast, and colon cancer. She agrees to continue to take prescription Vit D @50 ,000 IU every week # 30 with no refills and will follow up for routine testing of vitamin D, at least 2-3 times per year. She is stable on Vit D but is not yet at goal. She was informed of the risk of over-replacement of vitamin D and agrees to not increase her dose unless she discusses this with [place] first. We will check labs in 1 month.Mindy Collins agrees to follow up with our clinic in 2 weeks.  At risk for osteopenia and osteoporosis Mindy Collins was given extended  (15 minutes) osteoporosis prevention  counseling today. Mindy Collins is at risk for osteopenia and osteoporosis due to her vitamin D deficiency. She was encouraged to take her vitamin D and follow her higher calcium diet and increase strengthening exercise to help strengthen her bones and decrease her risk of osteopenia and osteoporosis.  Obesity Mindy Collins is currently in the action stage of change. As such, her goal is to continue with weight loss efforts She has agreed to follow the Category 3 plan journaling 400-600 calories and 45+ grams of protein with supper. Mindy Collins has been instructed to work up to a goal of 150 minutes of combined cardio and strengthening exercise per week for weight loss and overall health benefits. We discussed the following Behavioral Modification Strategies today: increasing lean protein intake, better snaking choices and keep a strict food journal.  Mindy Collins has agreed to follow up with our clinic in 2 weeks. She was informed of the importance of frequent follow up visits to maximize her success with intensive lifestyle modifications for her multiple health conditions.  ALLERGIES: Allergies  Allergen Reactions  . Esomeprazole Magnesium Shortness Of Breath    Nervousness; tolerates Aciphex.  . Adhesive [Tape] Itching and Rash    Itching and rash with adhesive tape  . Latex Itching and Dermatitis    Skin blistering  . Levocetirizine Dihydrochloride Other (See Comments)    Muscle jerks  . Nexium [Esomeprazole]     MEDICATIONS: Current Outpatient Medications on File Prior  to Visit  Medication Sig Dispense Refill  . cyclobenzaprine (FLEXERIL) 10 MG tablet TAKE 1 TABLET BY MOUTH THREE TIMES A DAY IF NEEDED FOR MUSCLE SPASM 30 tablet 1  . fluticasone (FLONASE) 50 MCG/ACT nasal spray instill 1 spray into each nostril twice a day  0  . levonorgestrel (MIRENA) 20 MCG/24HR IUD 1 each by Intrauterine route once.    . loratadine (CLARITIN) 10 MG tablet Take 10 mg by mouth daily.    . meloxicam (MOBIC) 15 MG tablet take  1 tablet by mouth daily if needed  0  . metFORMIN (GLUCOPHAGE) 500 MG tablet Take 1 tablet (500 mg total) by mouth daily with breakfast. 30 tablet 0  . olmesartan-hydrochlorothiazide (BENICAR HCT) 20-12.5 MG tablet Take 1 tablet by mouth daily. 90 tablet 1  . vitamin B-12 (CYANOCOBALAMIN) 1000 MCG tablet Take 3,000 mcg by mouth daily.      No current facility-administered medications on file prior to visit.     PAST MEDICAL HISTORY: Past Medical History:  Diagnosis Date  . Abdominal wall seroma   . Anemia   . Arthritis   . Asthma   . B12 deficiency   . Back pain   . Carpal tunnel syndrome, bilateral   . Chest pain   . Colitis   . Constipation   . Dysmenorrhea   . Dyspnea   . Fatty liver   . GERD (gastroesophageal reflux disease)   . HLD (hyperlipidemia)   . HPV (human papilloma virus) infection    WARTS/ TCA TX  . Hypertension   . Keratoconus   . Leg edema   . Obesity   . Obesity   . Osteoarthritis   . Palpitations   . Prediabetes   . Seasonal allergies     PAST SURGICAL HISTORY: Past Surgical History:  Procedure Laterality Date  . GYNECOLOGIC CRYOSURGERY  1993   DYSPLASIA CERVIX/   . MYOMECTOMY  02/17/2011   Procedure: MYOMECTOMY;  Surgeon: Terrance Mass, MD;  Location: [facility];  Service: Gynecology;  Laterality: N/A;  I am hoping to get a 7:30am time on Dec 4, Dec 11 or Dec 12. It not available I will check on some 1:00 pm times. Thanks  . MYOMECTOMY ABDOMINAL APPROACH  04/31/2007  . OVARIAN CYST REMOVAL  02/17/2011   Procedure: OVARIAN CYSTECTOMY;  Surgeon: Terrance Mass, MD;  Location: [facility];  Service: Gynecology;  Laterality: Left;    SOCIAL HISTORY: Social History   Tobacco Use  . Smoking status: Never Smoker  . Smokeless tobacco: Never Used  . Tobacco comment: denies tobacco   Substance Use Topics  . Alcohol use: No    Alcohol/week: 0.0 standard drinks  . Drug use: No    FAMILY HISTORY: Family History  Problem Relation Age of Onset  .  Hypertension Mother   . Breast cancer Mother   . Cancer Mother   . Hyperlipidemia Mother   . Thyroid disease Mother   . Obesity Mother   . Heart disease Maternal Grandmother   . Cancer Father        LUNG  . Breast cancer Maternal Aunt   . Diabetes Maternal Grandfather   . Cancer Paternal Aunt   . Ovarian cancer Paternal Aunt     ROS: Review of Systems  Constitutional: Negative for weight loss.  Gastrointestinal: Negative for nausea and vomiting.  Musculoskeletal:       Negative for muscle weakness    PHYSICAL EXAM: Blood pressure 128/83, pulse 78, temperature 98.1  F (36.7 C), temperature source Oral, height 5\' 2"  (1.575 m), weight 278 lb (126.1 kg), SpO2 97 %. Body mass index is 50.85 kg/m. Physical Exam Vitals signs reviewed.  Constitutional:      Appearance: Normal appearance. She is obese.  Cardiovascular:     Rate and Rhythm: Normal rate.     Pulses: Normal pulses.  Pulmonary:     Effort: Pulmonary effort is normal.  Musculoskeletal: Normal range of motion.  Skin:    General: Skin is warm and dry.  Neurological:     Mental Status: She is alert and oriented to person, place, and time.  Psychiatric:        Mood and Affect: Mood normal.        Behavior: Behavior normal.     RECENT LABS AND TESTS: BMET    Component Value Date/Time   NA 146 (H) 12/21/2017 0936   K 4.4 12/21/2017 0936   CL 102 12/21/2017 0936   CO2 27 12/21/2017 0936   GLUCOSE 91 12/21/2017 0936   GLUCOSE 99 02/22/2016 1206   BUN 11 12/21/2017 0936   CREATININE 0.83 12/21/2017 0936   CREATININE 0.75 02/22/2016 1206   CALCIUM 9.7 12/21/2017 0936   GFRNONAA 85 12/21/2017 0936   GFRAA 98 12/21/2017 0936   Lab Results  Component Value Date   HGBA1C 5.9 (H) 12/21/2017   HGBA1C 6.0 (H) 09/07/2017   HGBA1C 5.9 (H) 03/05/2017   HGBA1C 6.0 (H) 09/27/2016   HGBA1C 5.7 (H) 01/21/2014   Lab Results  Component Value Date   INSULIN 32.3 (H) 12/21/2017   INSULIN 14.8 09/07/2017   CBC      Component Value Date/Time   WBC 12.0 (H) 12/21/2017 0936   WBC 9.3 02/22/2016 1206   RBC 5.17 12/21/2017 0936   RBC 4.84 02/22/2016 1206   HGB 13.7 12/21/2017 0936   HGB 11.5 (L) 02/14/2011 1218   HCT 42.0 12/21/2017 0936   HCT 35.4 02/14/2011 1218   PLT 417 (H) 03/05/2017 1754   MCV 81 12/21/2017 0936   MCV 83.2 02/14/2011 1218   MCH 26.5 (L) 12/21/2017 0936   MCH 26.4 (L) 02/22/2016 1206   MCHC 32.6 12/21/2017 0936   MCHC 31.7 (L) 02/22/2016 1206   RDW 13.4 12/21/2017 0936   RDW 15.2 (H) 02/14/2011 1218   LYMPHSABS 2.2 12/21/2017 0936   LYMPHSABS 1.7 02/14/2011 1218   MONOABS 651 02/22/2016 1206   MONOABS 0.8 02/14/2011 1218   EOSABS 0.1 12/21/2017 0936   BASOSABS 0.1 12/21/2017 0936   BASOSABS 0.1 02/14/2011 1218   Iron/TIBC/Ferritin/ %Sat No results found for: IRON, TIBC, FERRITIN, IRONPCTSAT Lipid Panel     Component Value Date/Time   CHOL 242 (H) 12/21/2017 0936   TRIG 124 12/21/2017 0936   HDL 48 12/21/2017 0936   CHOLHDL 4.6 (H) 03/05/2017 1754   CHOLHDL 4.8 02/22/2016 1206   VLDL 21 02/22/2016 1206   LDLCALC 169 (H) 12/21/2017 0936   LDLDIRECT 141.6 04/16/2010 0743   Hepatic Function Panel     Component Value Date/Time   PROT 7.1 12/21/2017 0936   ALBUMIN 4.2 12/21/2017 0936   AST 29 12/21/2017 0936   ALT 35 (H) 12/21/2017 0936   ALKPHOS 127 (H) 12/21/2017 0936   BILITOT 0.5 12/21/2017 0936      Component Value Date/Time   TSH 3.190 09/07/2017 0917   TSH 2.190 03/05/2017 1754   TSH 1.72 02/22/2016 1206     Ref. Range 12/21/2017 09:36  Vitamin D, 25-Hydroxy Latest Ref  Range: 30.0 - 100.0 ng/mL 13.7 (L)     OBESITY BEHAVIORAL INTERVENTION VISIT  Today's visit was # 9  Starting weight: 280 lbs Starting date: 09/07/2017 Today's weight :: 278 lb  Today's date: 02/22/2018 Total lbs lost to date: 2  ASK: We discussed the diagnosis of obesity with Mindy Collins today and Mindy Collins agreed to give [place] permission to discuss obesity behavioral  modification therapy today.  ASSESS: Mindy Collins has the diagnosis of obesity and her BMI today is 50.83 Mindy Collins is in the action stage of change   ADVISE: Mindy Collins was educated on the multiple health risks of obesity as well as the benefit of weight loss to improve her health. She was advised of the need for long term treatment and the importance of lifestyle modifications to improve her current health and to decrease her risk of future health problems.  AGREE: Multiple dietary modification options and treatment options were discussed and  Mindy Collins agreed to follow the recommendations documented in the above note.  ARRANGE: Mindy Collins was educated on the importance of frequent visits to treat obesity as outlined per CMS and USPSTF guidelines and agreed to schedule her next follow up appointment today.  I, Mindy Collins, am acting as Location manager for Dennard Nip, MD I have reviewed the above documentation for accuracy and completeness, and I agree with the above. -Dennard Nip, MD

## 2018-03-03 ENCOUNTER — Encounter: Payer: 59 | Admitting: Obstetrics & Gynecology

## 2018-03-08 ENCOUNTER — Encounter (INDEPENDENT_AMBULATORY_CARE_PROVIDER_SITE_OTHER): Payer: Self-pay | Admitting: Family Medicine

## 2018-03-08 ENCOUNTER — Ambulatory Visit (INDEPENDENT_AMBULATORY_CARE_PROVIDER_SITE_OTHER): Payer: 59 | Admitting: Family Medicine

## 2018-03-08 VITALS — BP 129/84 | HR 83 | Temp 97.7°F | Ht 62.0 in | Wt 278.0 lb

## 2018-03-08 DIAGNOSIS — Z6841 Body Mass Index (BMI) 40.0 and over, adult: Secondary | ICD-10-CM

## 2018-03-08 DIAGNOSIS — Z9189 Other specified personal risk factors, not elsewhere classified: Secondary | ICD-10-CM | POA: Diagnosis not present

## 2018-03-08 DIAGNOSIS — R7303 Prediabetes: Secondary | ICD-10-CM | POA: Diagnosis not present

## 2018-03-08 DIAGNOSIS — R5383 Other fatigue: Secondary | ICD-10-CM

## 2018-03-08 MED ORDER — METFORMIN HCL 500 MG PO TABS: 500.0000 mg | ORAL_TABLET | Freq: Every day | ORAL | 0 refills | Status: DC

## 2018-03-08 NOTE — Progress Notes (Signed)
Office: [phone]  /  Fax: [phone]   HPI:   Chief Complaint: OBESITY Mindy Collins is here to discuss her progress with her obesity treatment plan. She is on the Category 3 plan and is following her eating plan approximately 10 % of the time. She states she is exercising 0 minutes 0 times per week. Mindy Collins has done well maintaining weight while working extra long hours and with extra stress. Her mother is living with her and it is making healthy eating difficult.  Her weight is 278 lb (126.1 kg) today and has not lost weight since her last visit. She has lost 2 lbs since starting treatment with [place].  Pre-Diabetes Mindy Collins has a diagnosis of pre-diabetes based on her elevated Hgb A1c and was informed this puts her at greater risk of developing diabetes. She is working on her diet and is doing well with metformin currently and continues to work on exercise to decrease risk of diabetes. She denies nausea, vomiting, or hypoglycemia.  At risk for cardiovascular disease Mindy Collins is at a higher than average risk for cardiovascular disease due to pre-diabetes and obesity. She currently denies any chest pain.  Fatigue Mindy Collins feels her energy is lower than it should be. This has worsened with weight gain and has not worsened recently. Mindy Collins admits to daytime somnolence and snoring. Her orthodontist feels that it is likely that she has sleep apnea. Her BMI is 50.8.  ASSESSMENT AND PLAN:  Prediabetes - Plan: metFORMIN (GLUCOPHAGE) 500 MG tablet  Other fatigue - Plan: Ambulatory referral to Sleep Studies  At risk for heart disease  Class 3 severe obesity with serious comorbidity and body mass index (BMI) of 50.0 to 59.9 in adult, unspecified obesity type ([place])  PLAN:  Diabetes II Mindy Collins has been given extensive diabetes education by myself today including ideal fasting and post-prandial blood glucose readings, individual ideal Hgb A1c goals, and hypoglycemia prevention. We discussed the importance of  good blood sugar control to decrease the likelihood of diabetic complications such as nephropathy, neuropathy, limb loss, blindness, coronary artery disease, and death. We discussed the importance of intensive lifestyle modification including diet, exercise and weight loss as the first line treatment for diabetes. Peytin agrees to get back to working on her diet prescription and taking metformin 500mg  with breakfast qd #30 with no refills and will follow up at the agreed upon time in 2 to 3 weeks.  Cardiovascular risk counseling Mindy Collins was given extended (15 minutes) coronary artery disease prevention counseling today. She is 48 y.o. female and has risk factors for heart disease including pre-diabetes and obesity. We discussed intensive lifestyle modifications today with an emphasis on specific weight loss instructions and strategies. Pt was also informed of the importance of increasing exercise and decreasing saturated fats to help prevent heart disease.  Fatigue Mindy Collins was informed that her fatigue may be related to obesity, depression or many other causes. Mindy Collins has agreed to work on diet, exercise and weight loss to help with fatigue. Proper sleep hygiene was discussed including the need for 7-8 hours of quality sleep each night. Mindy Collins will be referred to Mindy Collins for a sleep consult.  Obesity Mindy Collins is currently in the action stage of change. As such, her goal is to continue with weight loss efforts. She has agreed to follow the Category 3 plan and keeping a food journal of 400 to 600 calories with 40 grams of protein for supper. Mindy Collins has been instructed to work up to a goal of 150  minutes of combined cardio and strengthening exercise per week for weight loss and overall health benefits. We discussed the following Behavioral Modification Strategies today: increasing lean protein intake, decreasing simple carbohydrates, work on meal planning and easy cooking plans, and dealing with family or  coworker sabotage. We discussed strategies to help her mother eat healthier and not sabotage her efforts.  Mindy Collins has agreed to follow up with our clinic in 2 to 3 weeks. She was informed of the importance of frequent follow up visits to maximize her success with intensive lifestyle modifications for her multiple health conditions.  ALLERGIES: Allergies  Allergen Reactions  . Esomeprazole Magnesium Shortness Of Breath    Nervousness; tolerates Aciphex.  . Adhesive [Tape] Itching and Rash    Itching and rash with adhesive tape  . Latex Itching and Dermatitis    Skin blistering  . Levocetirizine Dihydrochloride Other (See Comments)    Muscle jerks  . Nexium [Esomeprazole]     MEDICATIONS: Current Outpatient Medications on File Prior to Visit  Medication Sig Dispense Refill  . cyclobenzaprine (FLEXERIL) 10 MG tablet TAKE 1 TABLET BY MOUTH THREE TIMES A DAY IF NEEDED FOR MUSCLE SPASM 30 tablet 1  . fluticasone (FLONASE) 50 MCG/ACT nasal spray instill 1 spray into each nostril twice a day  0  . levonorgestrel (MIRENA) 20 MCG/24HR IUD 1 each by Intrauterine route once.    . loratadine (CLARITIN) 10 MG tablet Take 10 mg by mouth daily.    . meloxicam (MOBIC) 15 MG tablet take 1 tablet by mouth daily if needed  0  . olmesartan-hydrochlorothiazide (BENICAR HCT) 20-12.5 MG tablet Take 1 tablet by mouth daily. 90 tablet 1  . vitamin B-12 (CYANOCOBALAMIN) 1000 MCG tablet Take 3,000 mcg by mouth daily.     . Vitamin D, Ergocalciferol, (DRISDOL) 1.25 MG (50000 UT) CAPS capsule Take 1 capsule (50,000 Units total) by mouth every 7 (seven) days. 4 capsule 0   No current facility-administered medications on file prior to visit.     PAST MEDICAL HISTORY: Past Medical History:  Diagnosis Date  . Abdominal wall seroma   . Anemia   . Arthritis   . Asthma   . B12 deficiency   . Back pain   . Carpal tunnel syndrome, bilateral   . Chest pain   . Colitis   . Constipation   . Dysmenorrhea   .  Dyspnea   . Fatty liver   . GERD (gastroesophageal reflux disease)   . HLD (hyperlipidemia)   . HPV (human papilloma virus) infection    WARTS/ TCA [place]  . Hypertension   . Keratoconus   . Leg edema   . Obesity   . Obesity   . Osteoarthritis   . Palpitations   . Prediabetes   . Seasonal allergies     PAST SURGICAL HISTORY: Past Surgical History:  Procedure Laterality Date  . GYNECOLOGIC CRYOSURGERY  1993   DYSPLASIA CERVIX/   . MYOMECTOMY  02/17/2011   Procedure: MYOMECTOMY;  Surgeon: Terrance Mass, MD;  Location: [facility];  Service: Gynecology;  Laterality: N/A;  I am hoping to get a 7:30am time on Dec 4, Dec 11 or Dec 12. It not available I will check on some 1:00 pm times. Thanks  . MYOMECTOMY ABDOMINAL APPROACH  04/31/2007  . OVARIAN CYST REMOVAL  02/17/2011   Procedure: OVARIAN CYSTECTOMY;  Surgeon: Terrance Mass, MD;  Location: [facility];  Service: Gynecology;  Laterality: Left;    SOCIAL HISTORY: Social History  Tobacco Use  . Smoking status: Never Smoker  . Smokeless tobacco: Never Used  . Tobacco comment: denies tobacco   Substance Use Topics  . Alcohol use: No    Alcohol/week: 0.0 standard drinks  . Drug use: No    FAMILY HISTORY: Family History  Problem Relation Age of Onset  . Hypertension Mother   . Breast cancer Mother   . Cancer Mother   . Hyperlipidemia Mother   . Thyroid disease Mother   . Obesity Mother   . Heart disease Maternal Grandmother   . Cancer Father        LUNG  . Breast cancer Maternal Aunt   . Diabetes Maternal Grandfather   . Cancer Paternal Aunt   . Ovarian cancer Paternal Aunt    ROS: Review of Systems  Constitutional: Positive for malaise/fatigue. Negative for weight loss.  Cardiovascular: Negative for chest pain.  Gastrointestinal: Negative for nausea and vomiting.  Endo/Heme/Allergies:       Negative for hypoglycemia.   PHYSICAL EXAM: Blood pressure 129/84, pulse 83, temperature 97.7 F (36.5 C), temperature source  Oral, height 5\' 2"  (1.575 m), weight 278 lb (126.1 kg), SpO2 97 %. Body mass index is 50.85 kg/m. Physical Exam Vitals signs reviewed.  Constitutional:      Appearance: Normal appearance. She is obese.  Cardiovascular:     Rate and Rhythm: Normal rate.  Pulmonary:     Effort: Pulmonary effort is normal.  Musculoskeletal: Normal range of motion.  Skin:    General: Skin is warm and dry.  Neurological:     Mental Status: She is alert and oriented to person, place, and time.  Psychiatric:        Mood and Affect: Mood normal.        Behavior: Behavior normal.    RECENT LABS AND TESTS: BMET    Component Value Date/Time   NA 146 (H) 12/21/2017 0936   K 4.4 12/21/2017 0936   CL 102 12/21/2017 0936   CO2 27 12/21/2017 0936   GLUCOSE 91 12/21/2017 0936   GLUCOSE 99 02/22/2016 1206   BUN 11 12/21/2017 0936   CREATININE 0.83 12/21/2017 0936   CREATININE 0.75 02/22/2016 1206   CALCIUM 9.7 12/21/2017 0936   GFRNONAA 85 12/21/2017 0936   GFRAA 98 12/21/2017 0936   Lab Results  Component Value Date   HGBA1C 5.9 (H) 12/21/2017   HGBA1C 6.0 (H) 09/07/2017   HGBA1C 5.9 (H) 03/05/2017   HGBA1C 6.0 (H) 09/27/2016   HGBA1C 5.7 (H) 01/21/2014   Lab Results  Component Value Date   INSULIN 32.3 (H) 12/21/2017   INSULIN 14.8 09/07/2017   CBC    Component Value Date/Time   WBC 12.0 (H) 12/21/2017 0936   WBC 9.3 02/22/2016 1206   RBC 5.17 12/21/2017 0936   RBC 4.84 02/22/2016 1206   HGB 13.7 12/21/2017 0936   HGB 11.5 (L) 02/14/2011 1218   HCT 42.0 12/21/2017 0936   HCT 35.4 02/14/2011 1218   PLT 417 (H) 03/05/2017 1754   MCV 81 12/21/2017 0936   MCV 83.2 02/14/2011 1218   MCH 26.5 (L) 12/21/2017 0936   MCH 26.4 (L) 02/22/2016 1206   MCHC 32.6 12/21/2017 0936   MCHC 31.7 (L) 02/22/2016 1206   RDW 13.4 12/21/2017 0936   RDW 15.2 (H) 02/14/2011 1218   LYMPHSABS 2.2 12/21/2017 0936   LYMPHSABS 1.7 02/14/2011 1218   MONOABS 651 02/22/2016 1206   MONOABS 0.8 02/14/2011 1218     EOSABS 0.1 12/21/2017  0936   BASOSABS 0.1 12/21/2017 0936   BASOSABS 0.1 02/14/2011 1218   Iron/TIBC/Ferritin/ %Sat No results found for: IRON, TIBC, FERRITIN, IRONPCTSAT Lipid Panel     Component Value Date/Time   CHOL 242 (H) 12/21/2017 0936   TRIG 124 12/21/2017 0936   HDL 48 12/21/2017 0936   CHOLHDL 4.6 (H) 03/05/2017 1754   CHOLHDL 4.8 02/22/2016 1206   VLDL 21 02/22/2016 1206   LDLCALC 169 (H) 12/21/2017 0936   LDLDIRECT 141.6 04/16/2010 0743   Hepatic Function Panel     Component Value Date/Time   PROT 7.1 12/21/2017 0936   ALBUMIN 4.2 12/21/2017 0936   AST 29 12/21/2017 0936   ALT 35 (H) 12/21/2017 0936   ALKPHOS 127 (H) 12/21/2017 0936   BILITOT 0.5 12/21/2017 0936      Component Value Date/Time   TSH 3.190 09/07/2017 0917   TSH 2.190 03/05/2017 1754   TSH 1.72 02/22/2016 1206   Results for JALIYA, SIEGMANN (MRN [id number]) as of 03/08/2018 15:19  Ref. Range 12/21/2017 09:36  Vitamin D, 25-Hydroxy Latest Ref Range: 30.0 - 100.0 ng/mL 13.7 (L)   OBESITY BEHAVIORAL INTERVENTION VISIT  Today's visit was # 10   Starting weight: 280 lbs Starting date: 09/07/17 Today's weight : Weight: 278 lb (126.1 kg)  Today's date: 03/08/2018 Total lbs lost to date: 2  ASK: We discussed the diagnosis of obesity with Mindy Collins today and Mindy Collins agreed to give [place] permission to discuss obesity behavioral modification therapy today.  ASSESS: Mindy Collins has the diagnosis of obesity and her BMI today is 50.8. Mindy Collins is in the action stage of change.   ADVISE: Mindy Collins was educated on the multiple health risks of obesity as well as the benefit of weight loss to improve her health. She was advised of the need for long term treatment and the importance of lifestyle modifications to improve her current health and to decrease her risk of future health problems.  AGREE: Multiple dietary modification options and treatment options were discussed and Mindy Collins agreed to follow the recommendations  documented in the above note.  ARRANGE: Mindy Collins was educated on the importance of frequent visits to treat obesity as outlined per CMS and USPSTF guidelines and agreed to schedule her next follow up appointment today.  I, Marcille Blanco, am acting as transcriptionist for Starlyn Skeans, MD  I have reviewed the above documentation for accuracy and completeness, and I agree with the above. -Dennard Nip, MD

## 2018-03-18 ENCOUNTER — Ambulatory Visit (INDEPENDENT_AMBULATORY_CARE_PROVIDER_SITE_OTHER): Payer: 59

## 2018-03-18 ENCOUNTER — Ambulatory Visit (INDEPENDENT_AMBULATORY_CARE_PROVIDER_SITE_OTHER): Payer: 59 | Admitting: Obstetrics & Gynecology

## 2018-03-18 ENCOUNTER — Other Ambulatory Visit: Payer: Self-pay | Admitting: Obstetrics & Gynecology

## 2018-03-18 DIAGNOSIS — N979 Female infertility, unspecified: Secondary | ICD-10-CM

## 2018-03-18 DIAGNOSIS — N949 Unspecified condition associated with female genital organs and menstrual cycle: Secondary | ICD-10-CM

## 2018-03-18 DIAGNOSIS — R1900 Intra-abdominal and pelvic swelling, mass and lump, unspecified site: Secondary | ICD-10-CM | POA: Diagnosis not present

## 2018-03-18 DIAGNOSIS — R102 Pelvic and perineal pain: Secondary | ICD-10-CM | POA: Diagnosis not present

## 2018-03-18 NOTE — Progress Notes (Signed)
    Mindy Collins 03-14-1970 [id number]        48 y.o.  G0P0 Single  RP: F/U mid-line pelvic cyst, Uterine Fibroids, IUD location for Pelvic US  HPI: Well on Mirena IUD, menses with light to moderate flow, stable x last year.  No pelvic pain.     OB History  Gravida Para Term Preterm AB Living  0            SAB TAB Ectopic Multiple Live Births               Past medical history,surgical history, problem list, medications, allergies, family history and social history were all reviewed and documented in the EPIC chart.   Directed ROS with pertinent positives and negatives documented in the history of present illness/assessment and plan.  Exam:  There were no vitals filed for this visit. General appearance:  Normal  Pelvic US today: T/V and T/a images.  Anteverted uterus measuring 8.86 x 5.08 x 4.92 cm.  Several small uterine fibroids measured at 1.9 cm, 1.8 cm, and 1.7 cm.  IUD seen in normal intrauterine position.  Ovaries normal in size.  Midline avascular cystic structure remains unchanged in size and appearance since February 2019, measuring 10 x 8 x 7 cm.  No free fluid in the posterior cul-de-sac.  Difficult exam due to high BMI.   Assessment/Plan:  48 y.o. G0  1. Adnexal cyst Pelvic ultrasound findings reviewed with patient.  Avascular midline pelvic cyst is stable compared to the ultrasound in February 2019 and also stable since the ultrasound in 2018.  Small uterine fibroids are also stable.  The IUD is in normal intrauterine position.  Ovaries normal and no free fluid in the posterior cul-de-sac.  Given that the cystic area is avascular and stable for 2 years and the patient is not experiencing any pain, will continue to observe.  Patient reassured.  2. Infertility, female Currently has the Mirena IUD.   AMA at 48 yo, would like to know Ovarian reserve. - Anti mullerian hormone  3. Morbid obesity ([place]) Followed at [facility].  Follow-up annual gynecologic  exam.  Counseling on above issues and coordination of care more than 50% for 15 minutes.  Princess Bruins MD, 11:43 AM 03/18/2018

## 2018-03-19 ENCOUNTER — Encounter: Payer: Self-pay | Admitting: Obstetrics & Gynecology

## 2018-03-19 NOTE — Patient Instructions (Signed)
1. Adnexal cyst Pelvic ultrasound findings reviewed with patient.  Avascular midline pelvic cyst is stable compared to the ultrasound in February 2019 and also stable since the ultrasound in 2018.  Small uterine fibroids are also stable.  The IUD is in normal intrauterine position.  Ovaries normal and no free fluid in the posterior cul-de-sac.  Given that the cystic area is avascular and stable for 2 years and the patient is not experiencing any pain, will continue to observe.  Patient reassured.  2. Infertility, female Currently has the Mirena IUD.   AMA at 48 yo, would like to know Ovarian reserve. - Anti mullerian hormone  3. Morbid obesity ([place]) Followed at [facility].  Follow-up annual gynecologic exam.  Mindy Collins, it was a pleasure seeing you today!

## 2018-03-22 LAB — ANTI-MULLERIAN HORMONE (AMH), FEMALE: Anti-Mullerian Hormones(AMH), Female: 0.03 ng/mL (ref 0.01–2.99)

## 2018-03-30 ENCOUNTER — Other Ambulatory Visit: Payer: Self-pay | Admitting: Family Medicine

## 2018-03-30 NOTE — Telephone Encounter (Signed)
Refill request for benicar HCT; last office visit 03/05/17; no upcoming visits noted; last refill 09/10/17; attempted to contact pt; left message on voicemail [phone]; will route to office for final disposition.

## 2018-03-31 ENCOUNTER — Encounter (INDEPENDENT_AMBULATORY_CARE_PROVIDER_SITE_OTHER): Payer: Self-pay

## 2018-03-31 ENCOUNTER — Ambulatory Visit (INDEPENDENT_AMBULATORY_CARE_PROVIDER_SITE_OTHER): Payer: 59 | Admitting: Family Medicine

## 2018-04-15 ENCOUNTER — Ambulatory Visit (INDEPENDENT_AMBULATORY_CARE_PROVIDER_SITE_OTHER): Payer: 59 | Admitting: Family Medicine

## 2018-04-15 ENCOUNTER — Encounter (INDEPENDENT_AMBULATORY_CARE_PROVIDER_SITE_OTHER): Payer: Self-pay | Admitting: Family Medicine

## 2018-04-15 VITALS — BP 124/82 | HR 80 | Temp 98.0°F | Ht 62.0 in | Wt 278.0 lb

## 2018-04-15 DIAGNOSIS — E559 Vitamin D deficiency, unspecified: Secondary | ICD-10-CM

## 2018-04-15 DIAGNOSIS — Z6841 Body Mass Index (BMI) 40.0 and over, adult: Secondary | ICD-10-CM

## 2018-04-15 DIAGNOSIS — R7303 Prediabetes: Secondary | ICD-10-CM | POA: Diagnosis not present

## 2018-04-15 DIAGNOSIS — E860 Dehydration: Secondary | ICD-10-CM

## 2018-04-15 DIAGNOSIS — Z9189 Other specified personal risk factors, not elsewhere classified: Secondary | ICD-10-CM | POA: Diagnosis not present

## 2018-04-15 MED ORDER — VITAMIN D (ERGOCALCIFEROL) 1.25 MG (50000 UNIT) PO CAPS: 50000.0000 [IU] | ORAL_CAPSULE | ORAL | 0 refills | Status: DC

## 2018-04-15 MED ORDER — METFORMIN HCL 500 MG PO TABS: 500.0000 mg | ORAL_TABLET | Freq: Every day | ORAL | 0 refills | Status: DC

## 2018-04-17 NOTE — Progress Notes (Signed)
Office: [phone]  /  Fax: [phone]   HPI:   Chief Complaint: OBESITY Mindy Collins is here to discuss her progress with her obesity treatment plan. She is on the keep a food journal with 400-600 calories and 40 grams of protein at supper daily and follow the Category 3 plan and is following her eating plan approximately 2 % of the time. She states she is exercising 0 minutes 0 times per week. Mindy Collins continues to do well with weight loss on her Category 3 plan, but has had increased stress this last month at work and with decreased sleep. She thinks this is calming down now.  Her weight is 278 lb (126.1 kg) today and has not lost weight since her last visit. She has lost 2 lbs since starting treatment with [place].  Pre-Diabetes Mindy Collins has a diagnosis of pre-diabetes based on her elevated Hgb A1c and was informed this puts her at greater risk of developing diabetes. She is stable on metformin and is working on decreasing sugar in her diet to decrease risk of diabetes. She denies nausea, vomiting, or hypoglycemia.  Vitamin D Deficiency Mindy Collins has a diagnosis of vitamin D deficiency. She is stable on prescription Vit D, but level is not yet at goal. She denies nausea, vomiting or muscle weakness.  Dehydration Mindy Collins notes dry mouth and not drinking as much H20, but has increased caffeine. She notes aches in her legs especially at night.   At risk for cardiovascular disease Mindy Collins is at a higher than average risk for cardiovascular disease due to obesity. She currently denies any chest pain.  ASSESSMENT AND PLAN:  Prediabetes - Plan: metFORMIN (GLUCOPHAGE) 500 MG tablet  Vitamin D deficiency - Plan: Vitamin D, Ergocalciferol, (DRISDOL) 1.25 MG (50000 UT) CAPS capsule  Dehydration  At risk for heart disease  Class 3 severe obesity with serious comorbidity and body mass index (BMI) of 50.0 to 59.9 in adult, unspecified obesity type ([place])  PLAN:  Pre-Diabetes Mindy Collins will continue to work on  weight loss, exercise, and decreasing simple carbohydrates in her diet to help decrease the risk of diabetes. We dicussed metformin including benefits and risks. She was informed that eating too many simple carbohydrates or too many calories at one sitting increases the likelihood of GI side effects. Reganne agrees to continue taking metformin 500 mg q AM #30 and we will refill for 1 month. We will recheck labs at next visit.  Mindy Collins agrees to follow up with our clinic in 2 to 3 weeks as directed to monitor her progress.  Vitamin D Deficiency Mindy Collins was informed that low vitamin D levels contributes to fatigue and are associated with obesity, breast, and colon cancer. Mindy Collins agrees to continue taking prescription Vit D @50 ,000 IU every week #4 and we will refill for 1 month. She will follow up for routine testing of vitamin D, at least 2-3 times per year. She was informed of the risk of over-replacement of vitamin D and agrees to not increase her dose unless she discusses this with [place] first. We will recheck labs at next visit. Mindy Collins agrees to follow up with our clinic in 2 to 3 weeks.  Dehydration Mindy Collins is to increase H20 to 100 oz per day and add in some calorie free electrolyte drinks. We will check labs in 2 to 3 weeks at her follow up visit.  Cardiovascular risk counseling Mindy Collins was given extended (15 minutes) coronary artery disease prevention counseling today. She is 48 y.o. female and has risk  factors for heart disease including obesity. We discussed intensive lifestyle modifications today with an emphasis on specific weight loss instructions and strategies. Pt was also informed of the importance of increasing exercise and decreasing saturated fats to help prevent heart disease.  Obesity Mindy Collins is currently in the action stage of change. As such, her goal is to continue with weight loss efforts She has agreed to follow the Category 3 plan Mindy Collins has been instructed to work up to a goal of 150 minutes  of combined cardio and strengthening exercise per week for weight loss and overall health benefits. We discussed the following Behavioral Modification Strategies today: increasing lean protein intake, decreasing simple carbohydrates, and increase H20 intake    Mindy Collins has agreed to follow up with our clinic in 2 to 3 weeks. She was informed of the importance of frequent follow up visits to maximize her success with intensive lifestyle modifications for her multiple health conditions.  ALLERGIES: Allergies  Allergen Reactions  . Esomeprazole Magnesium Shortness Of Breath    Nervousness; tolerates Aciphex.  . Adhesive [Tape] Itching and Rash    Itching and rash with adhesive tape  . Latex Itching and Dermatitis    Skin blistering  . Levocetirizine Dihydrochloride Other (See Comments)    Muscle jerks  . Nexium [Esomeprazole]     MEDICATIONS: Current Outpatient Medications on File Prior to Visit  Medication Sig Dispense Refill  . cyclobenzaprine (FLEXERIL) 10 MG tablet TAKE 1 TABLET BY MOUTH THREE TIMES A DAY IF NEEDED FOR MUSCLE SPASM 30 tablet 1  . fluticasone (FLONASE) 50 MCG/ACT nasal spray instill 1 spray into each nostril twice a day  0  . levonorgestrel (MIRENA) 20 MCG/24HR IUD 1 each by Intrauterine route once.    . loratadine (CLARITIN) 10 MG tablet Take 10 mg by mouth daily.    . meloxicam (MOBIC) 15 MG tablet take 1 tablet by mouth daily if needed  0  . olmesartan-hydrochlorothiazide (BENICAR HCT) 20-12.5 MG tablet Take 1 tablet by mouth daily. 90 tablet 1  . vitamin B-12 (CYANOCOBALAMIN) 1000 MCG tablet Take 3,000 mcg by mouth daily.      No current facility-administered medications on file prior to visit.     PAST MEDICAL HISTORY: Past Medical History:  Diagnosis Date  . Abdominal wall seroma   . Anemia   . Arthritis   . Asthma   . B12 deficiency   . Back pain   . Carpal tunnel syndrome, bilateral   . Chest pain   . Colitis   . Constipation   . Dysmenorrhea   .  Dyspnea   . Fatty liver   . GERD (gastroesophageal reflux disease)   . HLD (hyperlipidemia)   . HPV (human papilloma virus) infection    WARTS/ TCA [place]  . Hypertension   . Keratoconus   . Leg edema   . Obesity   . Obesity   . Osteoarthritis   . Palpitations   . Prediabetes   . Seasonal allergies     PAST SURGICAL HISTORY: Past Surgical History:  Procedure Laterality Date  . GYNECOLOGIC CRYOSURGERY  1993   DYSPLASIA CERVIX/   . MYOMECTOMY  02/17/2011   Procedure: MYOMECTOMY;  Surgeon: Terrance Mass, MD;  Location: [facility];  Service: Gynecology;  Laterality: N/A;  I am hoping to get a 7:30am time on Dec 4, Dec 11 or Dec 12. It not available I will check on some 1:00 pm times. Thanks  . MYOMECTOMY ABDOMINAL APPROACH  04/31/2007  .  OVARIAN CYST REMOVAL  02/17/2011   Procedure: OVARIAN CYSTECTOMY;  Surgeon: Terrance Mass, MD;  Location: [facility];  Service: Gynecology;  Laterality: Left;    SOCIAL HISTORY: Social History   Tobacco Use  . Smoking status: Never Smoker  . Smokeless tobacco: Never Used  . Tobacco comment: denies tobacco   Substance Use Topics  . Alcohol use: No    Alcohol/week: 0.0 standard drinks  . Drug use: No    FAMILY HISTORY: Family History  Problem Relation Age of Onset  . Hypertension Mother   . Breast cancer Mother   . Cancer Mother   . Hyperlipidemia Mother   . Thyroid disease Mother   . Obesity Mother   . Heart disease Maternal Grandmother   . Cancer Father        LUNG  . Breast cancer Maternal Aunt   . Diabetes Maternal Grandfather   . Cancer Paternal Aunt   . Ovarian cancer Paternal Aunt     ROS: Review of Systems  Constitutional: Negative for weight loss.  HENT:       + Dry mouth  Cardiovascular: Negative for chest pain.       + Leg pain  Gastrointestinal: Negative for nausea and vomiting.  Musculoskeletal:       Negative muscle weakness  Endo/Heme/Allergies:       Negative hypoglycemia    PHYSICAL EXAM: Blood pressure  124/82, pulse 80, temperature 98 F (36.7 C), temperature source Oral, height 5\' 2"  (8.453 m), weight 278 lb (126.1 kg), SpO2 97 %. Body mass index is 50.85 kg/m. Physical Exam Vitals signs reviewed.  Constitutional:      Appearance: Normal appearance. She is obese.  Cardiovascular:     Rate and Rhythm: Normal rate.     Pulses: Normal pulses.  Pulmonary:     Effort: Pulmonary effort is normal.     Breath sounds: Normal breath sounds.  Musculoskeletal: Normal range of motion.  Skin:    General: Skin is warm and dry.  Neurological:     Mental Status: She is alert and oriented to person, place, and time.  Psychiatric:        Mood and Affect: Mood normal.        Behavior: Behavior normal.     RECENT LABS AND TESTS: BMET    Component Value Date/Time   NA 146 (H) 12/21/2017 0936   K 4.4 12/21/2017 0936   CL 102 12/21/2017 0936   CO2 27 12/21/2017 0936   GLUCOSE 91 12/21/2017 0936   GLUCOSE 99 02/22/2016 1206   BUN 11 12/21/2017 0936   CREATININE 0.83 12/21/2017 0936   CREATININE 0.75 02/22/2016 1206   CALCIUM 9.7 12/21/2017 0936   GFRNONAA 85 12/21/2017 0936   GFRAA 98 12/21/2017 0936   Lab Results  Component Value Date   HGBA1C 5.9 (H) 12/21/2017   HGBA1C 6.0 (H) 09/07/2017   HGBA1C 5.9 (H) 03/05/2017   HGBA1C 6.0 (H) 09/27/2016   HGBA1C 5.7 (H) 01/21/2014   Lab Results  Component Value Date   INSULIN 32.3 (H) 12/21/2017   INSULIN 14.8 09/07/2017   CBC    Component Value Date/Time   WBC 12.0 (H) 12/21/2017 0936   WBC 9.3 02/22/2016 1206   RBC 5.17 12/21/2017 0936   RBC 4.84 02/22/2016 1206   HGB 13.7 12/21/2017 0936   HGB 11.5 (L) 02/14/2011 1218   HCT 42.0 12/21/2017 0936   HCT 35.4 02/14/2011 1218   PLT 417 (H) 03/05/2017 1754  MCV 81 12/21/2017 0936   MCV 83.2 02/14/2011 1218   MCH 26.5 (L) 12/21/2017 0936   MCH 26.4 (L) 02/22/2016 1206   MCHC 32.6 12/21/2017 0936   MCHC 31.7 (L) 02/22/2016 1206   RDW 13.4 12/21/2017 0936   RDW 15.2 (H)  02/14/2011 1218   LYMPHSABS 2.2 12/21/2017 0936   LYMPHSABS 1.7 02/14/2011 1218   MONOABS 651 02/22/2016 1206   MONOABS 0.8 02/14/2011 1218   EOSABS 0.1 12/21/2017 0936   BASOSABS 0.1 12/21/2017 0936   BASOSABS 0.1 02/14/2011 1218   Iron/TIBC/Ferritin/ %Sat No results found for: IRON, TIBC, FERRITIN, IRONPCTSAT Lipid Panel     Component Value Date/Time   CHOL 242 (H) 12/21/2017 0936   TRIG 124 12/21/2017 0936   HDL 48 12/21/2017 0936   CHOLHDL 4.6 (H) 03/05/2017 1754   CHOLHDL 4.8 02/22/2016 1206   VLDL 21 02/22/2016 1206   LDLCALC 169 (H) 12/21/2017 0936   LDLDIRECT 141.6 04/16/2010 0743   Hepatic Function Panel     Component Value Date/Time   PROT 7.1 12/21/2017 0936   ALBUMIN 4.2 12/21/2017 0936   AST 29 12/21/2017 0936   ALT 35 (H) 12/21/2017 0936   ALKPHOS 127 (H) 12/21/2017 0936   BILITOT 0.5 12/21/2017 0936      Component Value Date/Time   TSH 3.190 09/07/2017 0917   TSH 2.190 03/05/2017 1754   TSH 1.72 02/22/2016 1206      OBESITY BEHAVIORAL INTERVENTION VISIT  Today's visit was # 11   Starting weight: 280 lbs Starting date: 09/07/17 Today's weight : 278 lbs  Today's date: 04/15/2018 Total lbs lost to date: 2    04/15/2018  Height 5\' 2"  (1.575 m)  Weight 278 lb (126.1 kg)  BMI (Calculated) 50.83  BLOOD PRESSURE - SYSTOLIC 756  BLOOD PRESSURE - DIASTOLIC 82   Body Fat % 43.3 %  Total Body Water (lbs) 97.4 lbs     ASK: We discussed the diagnosis of obesity with Mindy Collins today and Mindy Collins agreed to give [place] permission to discuss obesity behavioral modification therapy today.  ASSESS: Mindy Collins has the diagnosis of obesity and her BMI today is 50.83 Mindy Collins is in the action stage of change   ADVISE: Mindy Collins was educated on the multiple health risks of obesity as well as the benefit of weight loss to improve her health. She was advised of the need for long term treatment and the importance of lifestyle modifications to improve her current health and to  decrease her risk of future health problems.  AGREE: Multiple dietary modification options and treatment options were discussed and  Mindy Collins agreed to follow the recommendations documented in the above note.  ARRANGE: Mindy Collins was educated on the importance of frequent visits to treat obesity as outlined per CMS and USPSTF guidelines and agreed to schedule her next follow up appointment today.  I, Mindy Collins, am acting as transcriptionist for Dennard Nip, MD  I have reviewed the above documentation for accuracy and completeness, and I agree with the above. -Dennard Nip, MD

## 2018-04-20 ENCOUNTER — Ambulatory Visit: Payer: 59 | Admitting: Neurology

## 2018-04-20 ENCOUNTER — Encounter: Payer: Self-pay | Admitting: Neurology

## 2018-04-20 VITALS — BP 132/77 | HR 94 | Ht 62.0 in | Wt 285.0 lb

## 2018-04-20 DIAGNOSIS — M5440 Lumbago with sciatica, unspecified side: Secondary | ICD-10-CM | POA: Diagnosis not present

## 2018-04-20 DIAGNOSIS — G4730 Sleep apnea, unspecified: Secondary | ICD-10-CM

## 2018-04-20 DIAGNOSIS — G4710 Hypersomnia, unspecified: Secondary | ICD-10-CM | POA: Diagnosis not present

## 2018-04-20 DIAGNOSIS — N938 Other specified abnormal uterine and vaginal bleeding: Secondary | ICD-10-CM

## 2018-04-20 DIAGNOSIS — R7303 Prediabetes: Secondary | ICD-10-CM

## 2018-04-20 DIAGNOSIS — E669 Obesity, unspecified: Secondary | ICD-10-CM

## 2018-04-20 DIAGNOSIS — F5101 Primary insomnia: Secondary | ICD-10-CM

## 2018-04-20 NOTE — Progress Notes (Signed)
SLEEP MEDICINE CLINIC   Provider:  Larey Seat, [place] D  Primary Care Physician:  Shawnee Knapp, MD   Referring Provider:  Dennard Nip, MD   Chief Complaint  Patient presents with  . New Patient (Initial Visit)    pt alone,rm 10. pt here to discuss potentially having sleep apnea. never had a sleep study.    HPI:  Meleena Munroe is a 48 y.o. AA female patient is seen on 04-20-2018 in a referral from Dr Leafy Ro, but she had been referred in 3/ 2019 by Penni Homans for the same indication, super-obesity with possible OSA/ obesity hypoventilation.   I have the pleasure of meeting with Mrs. Ranes today, and referral by Dr. Redgie Grayer.  She had seen her healthy weight and wellness doctor on 08 March 2018 with a chief concern of obesity.  She is in the category 3 weight loss plan, and her current BMI is 52.  She has trouble to install a regular exercise routine and she is also living with her mother which affects the choices of food available to her at the time for food preparation.  She is prediabetic and at risk for cardiovascular disease, she also feels fatigued she admits to daytime excessive sleepiness and she has known that she is snoring.  Her orthodontist even felt that she likely has sleep apnea.    She is a started on metformin Glucophage 500 mg was referred for sleep study and is taking metformin with breakfast daily.  Patient also takes Flexeril for muscle spasms which has a very sleepy making side effect, she is on Flonase for allergic rhinitis, uses a Mirena IUD, Claritin as needed during allergy season, Mobic for orthopedic pain, she is on Benicar HCTZ, vitamin B12 supplements and vitamin D supplements.  Sleep relevant medical history:She has an extensive history of medical diagnoses including osteoarthritis, asthma, iron deficiency anemia, abdominal wall seroma, vitamin B12 deficiency, lower back pain, noncardiac chest pain, colitis, dyspnea, [place] nonalcoholic fatty liver, and  gastroesophageal reflux disease, hypertension, keratoconus of the eye, palpitations.    Chief complaint according to patient : " I need help to sleep better and help my metabolism, too"   Sleep habits are as follows: dinner time for Mrs. Wollen varies. She eats out, and she eats late. Latest will be after 9 PM.  Spends the evening watching Tv, computer work, and she goes to bed when she feels tired enough- she stays in front of the screen until midnight, sometimes 3 or 4 AM.  The bedroom is cool, quiet and dark, no TV- but I pad.  She sleeps supine due to back pain on one pillow, flat bed, but wakes up frequently on her side. Can't sleep prone. "my mind is going' and " I wake up after 4-5 hours at the latest, tossing and turning from then on.  She dreams, VIVIDLY. Average sleep time estimated 4-5 hours total. Rises at 6.30 AM, and has a breakfast on the go.   Family sleep history: paternal aunt and maternal great-aunts with morbid obesity. Mother is 16, HTN, osteoporosis, breast cancer and chemotherapy, hyperlipidemia, sinus tachycardia. Tends to dehydrate.   Social history: I am in school and I work full time, lives with mother. Works in a daylight flooded window. No set sleep routines.  There have been some probable Compliance issues here. I have discussed with her the great importance of following the treatment plan exactly as directed in order to achieve a good medical outcome. She is  a non - Smoker, non drinker, non exerciser.  Caffeine : sodas- diet pepsi with caffeine ( 2-3 ) , not coffee.   Review of Systems: Out of a complete 14 system review, the patient complains of only the following symptoms, and all other reviewed systems are negative.  She has fallen out of bed due to a dream, twice in the last 9 month.  Had enuresis, dreamt she was in the bathroom.  Fatigue and sleepiness, snoring, apnea, vivid dreams. Stressed and sleep deprived.    Epworth score: 12/ 24 points  , Fatigue  severity score: N/A   , depression score: N/A    Social History   Socioeconomic History  . Marital status: Single    Spouse name: Not on file  . Number of children: 0  . Years of education: Not on file  . Highest education level: Not on file  Occupational History  . Occupation: Sport and exercise psychologist, Environmental consultant college, admin    Employer: [employer]    Comment: [employer]  . Financial resource strain: Not on file  . Food insecurity:    Worry: Not on file    Inability: Not on file  . Transportation needs:    Medical: Not on file    Non-medical: Not on file  Tobacco Use  . Smoking status: Never Smoker  . Smokeless tobacco: Never Used  . Tobacco comment: denies tobacco   Substance and Sexual Activity  . Alcohol use: No    Alcohol/week: 0.0 standard drinks  . Drug use: No  . Sexual activity: Yes    Birth control/protection: I.U.D.  Lifestyle  . Physical activity:    Days per week: Not on file    Minutes per session: Not on file  . Stress: Not on file  Relationships  . Social connections:    Talks on phone: Not on file    Gets together: Not on file    Attends religious service: Not on file    Active member of club or organization: Not on file    Attends meetings of clubs or organizations: Not on file    Relationship status: Not on file  . Intimate partner violence:    Fear of current or ex partner: Not on file    Emotionally abused: Not on file    Physically abused: Not on file    Forced sexual activity: Not on file  Other Topics Concern  . Not on file  Social History Narrative   Graduate degree from [employer] in woman/gender studies.   Lives with her mother.    Family History  Problem Relation Age of Onset  . Hypertension Mother   . Breast cancer Mother   . Cancer Mother   . Hyperlipidemia Mother   . Thyroid disease Mother   . Obesity Mother   . Heart disease Maternal Grandmother   . Cancer Father        LUNG  . Breast cancer Maternal Aunt   .  Diabetes Maternal Grandfather   . Cancer Paternal Aunt   . Ovarian cancer Paternal Aunt     Past Medical History:  Diagnosis Date  . Abdominal wall seroma   . Anemia   . Arthritis   . Asthma   . B12 deficiency   . Back pain   . Carpal tunnel syndrome, bilateral   . Chest pain   . Colitis   . Constipation   . Dysmenorrhea   . Dyspnea   . Fatty liver   .  GERD (gastroesophageal reflux disease)   . HLD (hyperlipidemia)   . HPV (human papilloma virus) infection    WARTS/ TCA TX  . Hypertension   . Keratoconus   . Leg edema   . Obesity   . Obesity   . Osteoarthritis   . Palpitations   . Prediabetes   . Seasonal allergies     Past Surgical History:  Procedure Laterality Date  . GYNECOLOGIC CRYOSURGERY  1993   DYSPLASIA CERVIX/   . MYOMECTOMY  02/17/2011   Procedure: MYOMECTOMY;  Surgeon: Terrance Mass, MD;  Location: [facility];  Service: Gynecology;  Laterality: N/A;  I am hoping to get a 7:30am time on Dec 4, Dec 11 or Dec 12. It not available I will check on some 1:00 pm times. Thanks  . MYOMECTOMY ABDOMINAL APPROACH  04/31/2007  . OVARIAN CYST REMOVAL  02/17/2011   Procedure: OVARIAN CYSTECTOMY;  Surgeon: Terrance Mass, MD;  Location: [facility];  Service: Gynecology;  Laterality: Left;    Current Outpatient Medications  Medication Sig Dispense Refill  . cyclobenzaprine (FLEXERIL) 10 MG tablet TAKE 1 TABLET BY MOUTH THREE TIMES A DAY IF NEEDED FOR MUSCLE SPASM 30 tablet 1  . fluticasone (FLONASE) 50 MCG/ACT nasal spray instill 1 spray into each nostril twice a day  0  . levonorgestrel (MIRENA) 20 MCG/24HR IUD 1 each by Intrauterine route once.    . loratadine (CLARITIN) 10 MG tablet Take 10 mg by mouth daily.    . meloxicam (MOBIC) 15 MG tablet take 1 tablet by mouth daily if needed  0  . metFORMIN (GLUCOPHAGE) 500 MG tablet Take 1 tablet (500 mg total) by mouth daily with breakfast. 30 tablet 0  . olmesartan-hydrochlorothiazide (BENICAR HCT) 20-12.5 MG tablet Take 1  tablet by mouth daily. 90 tablet 1  . vitamin B-12 (CYANOCOBALAMIN) 1000 MCG tablet Take 3,000 mcg by mouth daily.     . Vitamin D, Ergocalciferol, (DRISDOL) 1.25 MG (50000 UT) CAPS capsule Take 1 capsule (50,000 Units total) by mouth every 7 (seven) days. 4 capsule 0   No current facility-administered medications for this visit.     Allergies as of 04/20/2018 - Review Complete 04/20/2018  Allergen Reaction Noted  . Esomeprazole magnesium Shortness Of Breath 01/04/2011  . Adhesive [tape] Itching and Rash 09/06/2012  . Latex Itching and Dermatitis 08/16/2010  . Levocetirizine dihydrochloride Other (See Comments) 07/03/2016  . Nexium [esomeprazole]  10/31/2014    Vitals: BP 132/77   Pulse 94   Ht 5\' 2"  (1.575 m)   Wt 285 lb (129.3 kg)   BMI 52.13 kg/m  Last Weight:  Wt Readings from Last 1 Encounters:  04/20/18 285 lb (129.3 kg)   OEU:MPNT mass index is 52.13 kg/m.     Last Height:   Ht Readings from Last 1 Encounters:  04/20/18 5\' 2"  (1.575 m)    Physical exam:  General: The patient is awake, alert and appears not in acute distress. The patient is well groomed. Head: Normocephalic, atraumatic. Neck is supple. Mallampati: 2, no reddening. ,  neck circumference:17"  Nasal airflow patent ,  Retrognathia is notable. Overbite. Had retainers as a kid.  Cardiovascular:  Regular rate and rhythm , without  murmurs or carotid bruit, and without distended neck veins. Respiratory: Lungs are clear to auscultation. Skin:  Without evidence of edema, or rash Trunk: BMI is 50-52 kg/m2.   Neurologic exam : The patient is awake and alert, oriented to place and time.   Memory subjective  described as intact.  Attention span & concentration ability appears normal.  Speech is fluent,  without  dysarthria, dysphonia or aphasia.  Mood and affect are appropriate.  Cranial nerves: Normal smell, but blunted taste. Pupils are equal and briskly reactive to light. Extraocular movements in vertical  and horizontal planes intact and without nystagmus. Visual fields by finger perimetry are intact. Hearing to finger rub intact.  Facial sensation intact to fine touch. Facial motor strength is symmetric and tongue and uvula move midline. Shoulder shrug was symmetrical.   Motor exam:  Normal tone, muscle bulk and symmetric strength in all extremities. Grip strength is weaker right over left.  Sensory:  Fine touch, pinprick and vibration were tested in all extremities. Proprioception tested in the upper extremities was normal. Coordination: no changes in penmanship. Carpal tunnel on the right dominant hand.  Rapid alternating movements in the fingers/hands was normal. Finger-to-nose maneuver normal without evidence of ataxia, dysmetria or tremor.  Gait and station: Patient walks without assistive device. Repots SOB on stairs. Strength within normal limits.  Stance is wide based .  Turns with 4 Steps.  Deep tendon reflexes: in the  upper and lower extremities are symmetric and intact.    The patient was advised of the nature of the diagnosed disorder, the treatment options and the  risks for general health and wellness arising from not treating the condition.   I spent more than 34minutes of face to face time with the patient.  Greater than 50% of time was spent in counseling and coordination of care. We have discussed the diagnosis and differential and I answered the patient's questions.    Plan:  Treatment plan and additional workup :  Given Mrs. Parrilla's comorbidities related to her elevated body mass index I have strongly suspect that she will test positive for sleep apnea.  I would have preferred an attended sleep study but [place] healthcare will not permit this.  She will have to undergo a home sleep test as a screening test for obstructive sleep apnea and if degree and type of apnea are known we will from there on use likely an auto titration CPAP to treat sleep disordered breathing.  A dental  device can help to but is most effective in mild to moderate apnea, obstructive apnea, apnea without REM sleep accentuation and without significant oxygen desaturation.  This information I should be able to gather from a home sleep test result.    I or one of our NPs  will follow-up with a 90 days after the sleep study depending on diagnosis and therapy needed.   Larey Seat, MD 5/62/5638, 93:73 AM  Certified in Neurology by ABPN Certified in [place] by [facility] [address] [place], [place] [address]

## 2018-05-05 ENCOUNTER — Encounter: Payer: 59 | Admitting: Obstetrics & Gynecology

## 2018-05-06 ENCOUNTER — Encounter (INDEPENDENT_AMBULATORY_CARE_PROVIDER_SITE_OTHER): Payer: Self-pay

## 2018-05-10 ENCOUNTER — Encounter (INDEPENDENT_AMBULATORY_CARE_PROVIDER_SITE_OTHER): Payer: Self-pay | Admitting: Family Medicine

## 2018-05-10 ENCOUNTER — Other Ambulatory Visit: Payer: Self-pay

## 2018-05-10 ENCOUNTER — Ambulatory Visit (INDEPENDENT_AMBULATORY_CARE_PROVIDER_SITE_OTHER): Payer: 59 | Admitting: Family Medicine

## 2018-05-10 DIAGNOSIS — E559 Vitamin D deficiency, unspecified: Secondary | ICD-10-CM | POA: Diagnosis not present

## 2018-05-10 DIAGNOSIS — R7303 Prediabetes: Secondary | ICD-10-CM | POA: Diagnosis not present

## 2018-05-10 DIAGNOSIS — Z6841 Body Mass Index (BMI) 40.0 and over, adult: Secondary | ICD-10-CM

## 2018-05-10 MED ORDER — VITAMIN D (ERGOCALCIFEROL) 1.25 MG (50000 UNIT) PO CAPS: 50000.0000 [IU] | ORAL_CAPSULE | ORAL | 0 refills | Status: DC

## 2018-05-10 MED ORDER — METFORMIN HCL 500 MG PO TABS: 500.0000 mg | ORAL_TABLET | Freq: Every day | ORAL | 0 refills | Status: DC

## 2018-05-11 NOTE — Progress Notes (Signed)
Office: [phone]  /  Fax: [phone] TeleHealth Visit:  Mindy Collins has consented to this TeleHealth visit today via telephone call. The patient is located at home, the provider is located at the [facility] office. The participants in this visit include the listed provider and patient.   HPI:   Chief Complaint: OBESITY Mindy Collins is here to discuss her progress with her obesity treatment plan. She is on the Category 3 plan and is following her eating plan approximately 20 % of the time. She states she is exercising 0 minutes 0 times per week. Mindy Collins is now working from home and notes the grocery stores are very empty. She feels she has gained approximately 1 lb. She thinks she will do better.  We were unable to weight the patient today for this TeleHealth visit. She feels as if she has gained weight since her last visit. She has lost 2 lbs since starting treatment with [place].  Pre-Diabetes Mindy Collins has a diagnosis of pre-diabetes based on her elevated Hgb A1c and was informed this puts her at greater risk of developing diabetes. She is stable on metformin, she had some mild GI symptoms, but this has resolved. She continues to work on diet and exercise to decrease risk of diabetes. She denies hypoglycemia.  Vitamin D Deficiency Mindy Collins has a diagnosis of vitamin D deficiency. She is stable on prescription Vit D, but level is not yet at goal. Last level was still very low. She denies nausea, vomiting or muscle weakness.  ASSESSMENT AND PLAN:  Prediabetes - Plan: metFORMIN (GLUCOPHAGE) 500 MG tablet  Vitamin D deficiency - Plan: Vitamin D, Ergocalciferol, (DRISDOL) 1.25 MG (50000 UT) CAPS capsule  Class 3 severe obesity with serious comorbidity and body mass index (BMI) of 50.0 to 59.9 in adult, unspecified obesity type ([place])  PLAN:  Pre-Diabetes Mindy Collins will continue to work on weight loss, exercise, and decreasing simple carbohydrates in her diet to help decrease the risk of  diabetes. We dicussed metformin including benefits and risks. She was informed that eating too many simple carbohydrates or too many calories at one sitting increases the likelihood of GI side effects. Triana agrees to continue taking metformin 500 mg q AM #30 and we will refill for 1 month. We will recheck labs next month. Mindy Collins agrees to follow up with our clinic in 2 to 3 weeks as directed to monitor her progress.  Vitamin D Deficiency Mindy Collins was informed that low vitamin D levels contributes to fatigue and are associated with obesity, breast, and colon cancer. Mindy Collins agrees to continue taking prescription Vit D @50 ,000 IU every week #4 and we will refill for 1 month. She will follow up for routine testing of vitamin D, at least 2-3 times per year. She was informed of the risk of over-replacement of vitamin D and agrees to not increase her dose unless she discusses this with [place] first. We will recheck labs next month. Mindy Collins agrees to follow up with our clinic in 2 to 3 weeks.  Obesity Mindy Collins is currently in the action stage of change. As such, her goal is to continue with weight loss efforts She has agreed to follow the Category 3 plan Mindy Collins has been instructed to work up to a goal of 150 minutes of combined cardio and strengthening exercise per week or start walking for 15 minutes per day for weight loss and overall health benefits. We discussed the following Behavioral Modification Strategies today: increasing lean protein intake, decreasing simple carbohydrates, work  on meal planning and easy cooking plans, emotional eating strategies, ways to avoid boredom eating, ways to avoid night time snacking, keeping healthy foods in the home, and better snacking choices   Mindy Collins has agreed to follow up with our clinic in 2 to 3 weeks. She was informed of the importance of frequent follow up visits to maximize her success with intensive lifestyle modifications for her multiple health  conditions.  ALLERGIES: Allergies  Allergen Reactions   Esomeprazole Magnesium Shortness Of Breath    Nervousness; tolerates Aciphex.   Adhesive [Tape] Itching and Rash    Itching and rash with adhesive tape   Latex Itching and Dermatitis    Skin blistering   Levocetirizine Dihydrochloride Other (See Comments)    Muscle jerks   Nexium [Esomeprazole]     MEDICATIONS: Current Outpatient Medications on File Prior to Visit  Medication Sig Dispense Refill   cyclobenzaprine (FLEXERIL) 10 MG tablet TAKE 1 TABLET BY MOUTH THREE TIMES A DAY IF NEEDED FOR MUSCLE SPASM 30 tablet 1   fluticasone (FLONASE) 50 MCG/ACT nasal spray instill 1 spray into each nostril twice a day  0   levonorgestrel (MIRENA) 20 MCG/24HR IUD 1 each by Intrauterine route once.     loratadine (CLARITIN) 10 MG tablet Take 10 mg by mouth daily.     meloxicam (MOBIC) 15 MG tablet take 1 tablet by mouth daily if needed  0   olmesartan-hydrochlorothiazide (BENICAR HCT) 20-12.5 MG tablet Take 1 tablet by mouth daily. 90 tablet 1   vitamin B-12 (CYANOCOBALAMIN) 1000 MCG tablet Take 3,000 mcg by mouth daily.      No current facility-administered medications on file prior to visit.     PAST MEDICAL HISTORY: Past Medical History:  Diagnosis Date   Abdominal wall seroma    Anemia    Arthritis    Asthma    B12 deficiency    Back pain    Carpal tunnel syndrome, bilateral    Chest pain    Colitis    Constipation    Dysmenorrhea    Dyspnea    Fatty liver    GERD (gastroesophageal reflux disease)    HLD (hyperlipidemia)    HPV (human papilloma virus) infection    WARTS/ TCA TX   Hypertension    Keratoconus    Leg edema    Obesity    Obesity    Osteoarthritis    Palpitations    Prediabetes    Seasonal allergies     PAST SURGICAL HISTORY: Past Surgical History:  Procedure Laterality Date   GYNECOLOGIC CRYOSURGERY  1993   DYSPLASIA CERVIX/    MYOMECTOMY  02/17/2011    Procedure: MYOMECTOMY;  Surgeon: Terrance Mass, MD;  Location: [facility];  Service: Gynecology;  Laterality: N/A;  I am hoping to get a 7:30am time on Dec 4, Dec 11 or Dec 12. It not available I will check on some 1:00 pm times. Thanks   MYOMECTOMY ABDOMINAL APPROACH  04/31/2007   OVARIAN CYST REMOVAL  02/17/2011   Procedure: OVARIAN CYSTECTOMY;  Surgeon: Terrance Mass, MD;  Location: [facility];  Service: Gynecology;  Laterality: Left;    SOCIAL HISTORY: Social History   Tobacco Use   Smoking status: Never Smoker   Smokeless tobacco: Never Used   Tobacco comment: denies tobacco   Substance Use Topics   Alcohol use: No    Alcohol/week: 0.0 standard drinks   Drug use: No    FAMILY HISTORY: Family History  Problem Relation Age  of Onset   Hypertension Mother    Breast cancer Mother    Cancer Mother    Hyperlipidemia Mother    Thyroid disease Mother    Obesity Mother    Heart disease Maternal Grandmother    Cancer Father        LUNG   Breast cancer Maternal Aunt    Diabetes Maternal Grandfather    Cancer Paternal Aunt    Ovarian cancer Paternal Aunt     ROS: Review of Systems  Constitutional: Negative for weight loss.  Gastrointestinal: Negative for nausea and vomiting.  Musculoskeletal:       Negative muscle weakness  Endo/Heme/Allergies:       Negative hypoglycemia    PHYSICAL EXAM: Pt in no acute distress  RECENT LABS AND TESTS: BMET    Component Value Date/Time   NA 146 (H) 12/21/2017 0936   K 4.4 12/21/2017 0936   CL 102 12/21/2017 0936   CO2 27 12/21/2017 0936   GLUCOSE 91 12/21/2017 0936   GLUCOSE 99 02/22/2016 1206   BUN 11 12/21/2017 0936   CREATININE 0.83 12/21/2017 0936   CREATININE 0.75 02/22/2016 1206   CALCIUM 9.7 12/21/2017 0936   GFRNONAA 85 12/21/2017 0936   GFRAA 98 12/21/2017 0936   Lab Results  Component Value Date   HGBA1C 5.9 (H) 12/21/2017   HGBA1C 6.0 (H) 09/07/2017   HGBA1C 5.9 (H) 03/05/2017   HGBA1C 6.0 (H)  09/27/2016   HGBA1C 5.7 (H) 01/21/2014   Lab Results  Component Value Date   INSULIN 32.3 (H) 12/21/2017   INSULIN 14.8 09/07/2017   CBC    Component Value Date/Time   WBC 12.0 (H) 12/21/2017 0936   WBC 9.3 02/22/2016 1206   RBC 5.17 12/21/2017 0936   RBC 4.84 02/22/2016 1206   HGB 13.7 12/21/2017 0936   HGB 11.5 (L) 02/14/2011 1218   HCT 42.0 12/21/2017 0936   HCT 35.4 02/14/2011 1218   PLT 417 (H) 03/05/2017 1754   MCV 81 12/21/2017 0936   MCV 83.2 02/14/2011 1218   MCH 26.5 (L) 12/21/2017 0936   MCH 26.4 (L) 02/22/2016 1206   MCHC 32.6 12/21/2017 0936   MCHC 31.7 (L) 02/22/2016 1206   RDW 13.4 12/21/2017 0936   RDW 15.2 (H) 02/14/2011 1218   LYMPHSABS 2.2 12/21/2017 0936   LYMPHSABS 1.7 02/14/2011 1218   MONOABS 651 02/22/2016 1206   MONOABS 0.8 02/14/2011 1218   EOSABS 0.1 12/21/2017 0936   BASOSABS 0.1 12/21/2017 0936   BASOSABS 0.1 02/14/2011 1218   Iron/TIBC/Ferritin/ %Sat No results found for: IRON, TIBC, FERRITIN, IRONPCTSAT Lipid Panel     Component Value Date/Time   CHOL 242 (H) 12/21/2017 0936   TRIG 124 12/21/2017 0936   HDL 48 12/21/2017 0936   CHOLHDL 4.6 (H) 03/05/2017 1754   CHOLHDL 4.8 02/22/2016 1206   VLDL 21 02/22/2016 1206   LDLCALC 169 (H) 12/21/2017 0936   LDLDIRECT 141.6 04/16/2010 0743   Hepatic Function Panel     Component Value Date/Time   PROT 7.1 12/21/2017 0936   ALBUMIN 4.2 12/21/2017 0936   AST 29 12/21/2017 0936   ALT 35 (H) 12/21/2017 0936   ALKPHOS 127 (H) 12/21/2017 0936   BILITOT 0.5 12/21/2017 0936      Component Value Date/Time   TSH 3.190 09/07/2017 0917   TSH 2.190 03/05/2017 1754   TSH 1.72 02/22/2016 1206      I, Trixie Dredge, am acting as transcriptionist for Dennard Nip, MD I have reviewed  the above documentation for accuracy and completeness, and I agree with the above. -Dennard Nip, MD

## 2018-05-27 ENCOUNTER — Ambulatory Visit (INDEPENDENT_AMBULATORY_CARE_PROVIDER_SITE_OTHER): Payer: 59 | Admitting: Family Medicine

## 2018-05-27 ENCOUNTER — Encounter (INDEPENDENT_AMBULATORY_CARE_PROVIDER_SITE_OTHER): Payer: Self-pay | Admitting: Family Medicine

## 2018-05-27 ENCOUNTER — Other Ambulatory Visit: Payer: Self-pay

## 2018-05-27 DIAGNOSIS — Z6841 Body Mass Index (BMI) 40.0 and over, adult: Secondary | ICD-10-CM

## 2018-05-27 DIAGNOSIS — E66813 Obesity, class 3: Secondary | ICD-10-CM

## 2018-05-27 DIAGNOSIS — R7303 Prediabetes: Secondary | ICD-10-CM | POA: Diagnosis not present

## 2018-05-27 NOTE — Progress Notes (Signed)
Office: [phone]  /  Fax: [phone] TeleHealth Visit:  Mindy Collins has verbally consented to this TeleHealth visit today. The patient is located at home, the provider is located at the [employer] [facility] office. The participants in this visit include the listed provider and patient. Mindy Collins was unable to use realtime audiovisual technology today and the telehealth visit was conducted via telephone.  HPI:   Chief Complaint: OBESITY Mindy Collins is here to discuss her progress with her obesity treatment plan. She is on the Category 3 plan and is following her eating plan approximately 60 % of the time. She states she is exercising 0 minutes 0 times per week. Mindy Collins feels that she has done well with maintaining weight. She is working from home and is much more sedentary. She has questions about how to exercise when she is behind her computer for 12 hours a day.  We were unable to weigh the patient today for this TeleHealth visit. She feels as if she has maintained weight since her last visit. She has lost 2 lbs since starting treatment with Mindy Collins.  Pre-Diabetes Mindy Collins has a diagnosis of pre-diabetes based on her elevated Hgb A1c and was informed this puts her at greater risk of developing diabetes. She is doing well on her diet prescription and she is tolerating metformin well. She has questions about what foods affect her blood sugar and her diabetes risk. She continues to work on diet and exercise to decrease risk of diabetes. She denies nausea, vomiting, or hypoglycemia.   ASSESSMENT AND PLAN:  Prediabetes  Class 3 severe obesity with serious comorbidity and body mass index (BMI) of 50.0 to 59.9 in adult, unspecified obesity type ([place])  PLAN:  Pre-Diabetes Mindy Collins will continue to work on weight loss, exercise, and decreasing simple carbohydrates in her diet to help decrease the risk of diabetes. She was informed that eating too many simple carbohydrates or too many calories at one sitting  increases the likelihood of GI side effects. Mindy Collins agreed to continue metformin as prescribed and was given education of the importance of decreasing simple carbs and increasing vegetables and lean protein. Mindy Collins agreed to follow up with Mindy Collins as directed to monitor her progress in 2 weeks.  I spent > than 50% of the 15 minute visit on counseling as documented in the note.   Obesity Mindy Collins is currently in the action stage of change. As such, her goal is to continue with weight loss efforts. She has agreed to follow the Category 3 plan. Mindy Collins was encouraged to start an indoor walking video for 10 minutes a day and she will follow up as directed. We discussed the following Behavioral Modification Stratagies today: work on meal planning and easy cooking plans and keeping healthy foods in the home.  Mindy Collins has agreed to follow up with our clinic in 3 weeks. She was informed of the importance of frequent follow up visits to maximize her success with intensive lifestyle modifications for her multiple health conditions.  ALLERGIES: Allergies  Allergen Reactions  . Esomeprazole Magnesium Shortness Of Breath    Nervousness; tolerates Aciphex.  . Adhesive [Tape] Itching and Rash    Itching and rash with adhesive tape  . Latex Itching and Dermatitis    Skin blistering  . Levocetirizine Dihydrochloride Other (See Comments)    Muscle jerks  . Nexium [Esomeprazole]     MEDICATIONS: Current Outpatient Medications on File Prior to Visit  Medication Sig Dispense Refill  . cyclobenzaprine (FLEXERIL) 10 MG tablet  TAKE 1 TABLET BY MOUTH THREE TIMES A DAY IF NEEDED FOR MUSCLE SPASM 30 tablet 1  . fluticasone (FLONASE) 50 MCG/ACT nasal spray instill 1 spray into each nostril twice a day  0  . levonorgestrel (MIRENA) 20 MCG/24HR IUD 1 each by Intrauterine route once.    . loratadine (CLARITIN) 10 MG tablet Take 10 mg by mouth daily.    . meloxicam (MOBIC) 15 MG tablet take 1 tablet by mouth daily if needed  0   . metFORMIN (GLUCOPHAGE) 500 MG tablet Take 1 tablet (500 mg total) by mouth daily with breakfast. 30 tablet 0  . olmesartan-hydrochlorothiazide (BENICAR HCT) 20-12.5 MG tablet Take 1 tablet by mouth daily. 90 tablet 1  . vitamin B-12 (CYANOCOBALAMIN) 1000 MCG tablet Take 3,000 mcg by mouth daily.     . Vitamin D, Ergocalciferol, (DRISDOL) 1.25 MG (50000 UT) CAPS capsule Take 1 capsule (50,000 Units total) by mouth every 7 (seven) days. 4 capsule 0   No current facility-administered medications on file prior to visit.     PAST MEDICAL HISTORY: Past Medical History:  Diagnosis Date  . Abdominal wall seroma   . Anemia   . Arthritis   . Asthma   . B12 deficiency   . Back pain   . Carpal tunnel syndrome, bilateral   . Chest pain   . Colitis   . Constipation   . Dysmenorrhea   . Dyspnea   . Fatty liver   . GERD (gastroesophageal reflux disease)   . HLD (hyperlipidemia)   . HPV (human papilloma virus) infection    WARTS/ TCA [place]  . Hypertension   . Keratoconus   . Leg edema   . Obesity   . Obesity   . Osteoarthritis   . Palpitations   . Prediabetes   . Seasonal allergies     PAST SURGICAL HISTORY: Past Surgical History:  Procedure Laterality Date  . GYNECOLOGIC CRYOSURGERY  1993   DYSPLASIA CERVIX/   . MYOMECTOMY  02/17/2011   Procedure: MYOMECTOMY;  Surgeon: Terrance Mass, MD;  Location: [facility];  Service: Gynecology;  Laterality: N/A;  I am hoping to get a 7:30am time on Dec 4, Dec 11 or Dec 12. It not available I will check on some 1:00 pm times. Thanks  . MYOMECTOMY ABDOMINAL APPROACH  04/31/2007  . OVARIAN CYST REMOVAL  02/17/2011   Procedure: OVARIAN CYSTECTOMY;  Surgeon: Terrance Mass, MD;  Location: [facility];  Service: Gynecology;  Laterality: Left;    SOCIAL HISTORY: Social History   Tobacco Use  . Smoking status: Never Smoker  . Smokeless tobacco: Never Used  . Tobacco comment: denies tobacco   Substance Use Topics  . Alcohol use: No    Alcohol/week: 0.0  standard drinks  . Drug use: No    FAMILY HISTORY: Family History  Problem Relation Age of Onset  . Hypertension Mother   . Breast cancer Mother   . Cancer Mother   . Hyperlipidemia Mother   . Thyroid disease Mother   . Obesity Mother   . Heart disease Maternal Grandmother   . Cancer Father        LUNG  . Breast cancer Maternal Aunt   . Diabetes Maternal Grandfather   . Cancer Paternal Aunt   . Ovarian cancer Paternal Aunt     ROS: Review of Systems  Gastrointestinal: Negative for nausea and vomiting.  Endo/Heme/Allergies:       Negative for hypoglycemia.    PHYSICAL EXAM: Pt  in no acute distress  RECENT LABS AND TESTS: BMET    Component Value Date/Time   NA 146 (H) 12/21/2017 0936   K 4.4 12/21/2017 0936   CL 102 12/21/2017 0936   CO2 27 12/21/2017 0936   GLUCOSE 91 12/21/2017 0936   GLUCOSE 99 02/22/2016 1206   BUN 11 12/21/2017 0936   CREATININE 0.83 12/21/2017 0936   CREATININE 0.75 02/22/2016 1206   CALCIUM 9.7 12/21/2017 0936   GFRNONAA 85 12/21/2017 0936   GFRAA 98 12/21/2017 0936   Lab Results  Component Value Date   HGBA1C 5.9 (H) 12/21/2017   HGBA1C 6.0 (H) 09/07/2017   HGBA1C 5.9 (H) 03/05/2017   HGBA1C 6.0 (H) 09/27/2016   HGBA1C 5.7 (H) 01/21/2014   Lab Results  Component Value Date   INSULIN 32.3 (H) 12/21/2017   INSULIN 14.8 09/07/2017   CBC    Component Value Date/Time   WBC 12.0 (H) 12/21/2017 0936   WBC 9.3 02/22/2016 1206   RBC 5.17 12/21/2017 0936   RBC 4.84 02/22/2016 1206   HGB 13.7 12/21/2017 0936   HGB 11.5 (L) 02/14/2011 1218   HCT 42.0 12/21/2017 0936   HCT 35.4 02/14/2011 1218   PLT 417 (H) 03/05/2017 1754   MCV 81 12/21/2017 0936   MCV 83.2 02/14/2011 1218   MCH 26.5 (L) 12/21/2017 0936   MCH 26.4 (L) 02/22/2016 1206   MCHC 32.6 12/21/2017 0936   MCHC 31.7 (L) 02/22/2016 1206   RDW 13.4 12/21/2017 0936   RDW 15.2 (H) 02/14/2011 1218   LYMPHSABS 2.2 12/21/2017 0936   LYMPHSABS 1.7 02/14/2011 1218   MONOABS  651 02/22/2016 1206   MONOABS 0.8 02/14/2011 1218   EOSABS 0.1 12/21/2017 0936   BASOSABS 0.1 12/21/2017 0936   BASOSABS 0.1 02/14/2011 1218   Iron/TIBC/Ferritin/ %Sat No results found for: IRON, TIBC, FERRITIN, IRONPCTSAT Lipid Panel     Component Value Date/Time   CHOL 242 (H) 12/21/2017 0936   TRIG 124 12/21/2017 0936   HDL 48 12/21/2017 0936   CHOLHDL 4.6 (H) 03/05/2017 1754   CHOLHDL 4.8 02/22/2016 1206   VLDL 21 02/22/2016 1206   LDLCALC 169 (H) 12/21/2017 0936   LDLDIRECT 141.6 04/16/2010 0743   Hepatic Function Panel     Component Value Date/Time   PROT 7.1 12/21/2017 0936   ALBUMIN 4.2 12/21/2017 0936   AST 29 12/21/2017 0936   ALT 35 (H) 12/21/2017 0936   ALKPHOS 127 (H) 12/21/2017 0936   BILITOT 0.5 12/21/2017 0936      Component Value Date/Time   TSH 3.190 09/07/2017 0917   TSH 2.190 03/05/2017 1754   TSH 1.72 02/22/2016 1206   Results for KORISSA, HORSFORD (MRN [id number]) as of 05/27/2018 10:40  Ref. Range 12/21/2017 09:36  Vitamin D, 25-Hydroxy Latest Ref Range: 30.0 - 100.0 ng/mL 13.7 (L)    I, Marcille Blanco, CMA, am acting as transcriptionist for Starlyn Skeans, MD I have reviewed the above documentation for accuracy and completeness, and I agree with the above. -Dennard Nip, MD

## 2018-06-01 ENCOUNTER — Ambulatory Visit (INDEPENDENT_AMBULATORY_CARE_PROVIDER_SITE_OTHER): Payer: 59 | Admitting: Neurology

## 2018-06-01 ENCOUNTER — Other Ambulatory Visit: Payer: Self-pay

## 2018-06-01 DIAGNOSIS — G4710 Hypersomnia, unspecified: Secondary | ICD-10-CM | POA: Diagnosis not present

## 2018-06-01 DIAGNOSIS — G4730 Sleep apnea, unspecified: Secondary | ICD-10-CM

## 2018-06-01 DIAGNOSIS — M5440 Lumbago with sciatica, unspecified side: Secondary | ICD-10-CM

## 2018-06-01 DIAGNOSIS — E669 Obesity, unspecified: Secondary | ICD-10-CM

## 2018-06-01 DIAGNOSIS — N938 Other specified abnormal uterine and vaginal bleeding: Secondary | ICD-10-CM

## 2018-06-01 DIAGNOSIS — F5101 Primary insomnia: Secondary | ICD-10-CM

## 2018-06-01 DIAGNOSIS — R7303 Prediabetes: Secondary | ICD-10-CM

## 2018-06-07 ENCOUNTER — Encounter: Payer: Self-pay | Admitting: Neurology

## 2018-06-07 NOTE — Progress Notes (Signed)
Patient Information     First Name: Mindy Last Name: Collins ID: [id number]  Birth Date: August 11, 1970 Age: 48 Gender: Female  Referring Physician: Dennard Nip, MD BMI: 52.3 (W=284 lb, H=5' 2'')  Neck Circ.:  17" Epworth:  12/24   Sleep Study Information    Study Date: Jun 02, 2018 S/H/A Version: [id number] / 4.1.1528 / 59   History: Mindy Collins had seen her healthy weight and wellness doctor, Mindy Collins, on 08 March 2018 with a chief concern of obesity.  She is pre-diabetic and at risk for cardiovascular disease, she also feels fatigued. She takes Flexaril, which additionally makes patients sleepy. She admits to daytime excessive sleepiness and she has known that she is snoring.  Her orthodontist even felt that she likely has sleep apnea. She has an extensive history of medical diagnoses including osteoarthritis, asthma, iron deficiency anemia, abdominal wall seroma, vitamin B12 deficiency, lower back pain, non-cardiac chest pain, colitis, dyspnea, NASH- nonalcoholic fatty liver, and gastroesophageal reflux disease, hypertension, keratoconus of the eye, palpitations. She is in the category 3 weight loss plan, and her BMI was 52 kg/m2.  She has trouble to implement a regular exercise routine and she is also living with her mother- which affects the choices of food available to her at the time for food preparation.       Summary & Diagnosis:    The HST indicates the presence of severe Obstructive Sleep Apnea, at an AHI of 61.7/H. REM sleep further accentuated the AHI to 79.4/h. There were frequent oxygen desaturations, but these were not clinically sustained by total desaturation time.   Recommendations:      This degree of OSA is very severe, and poses risks for the patient's cardiovascular- , pulmonary- and cerebrovascular health.  Immediate CPAP therapy is recommended. I will order an autotitration CPAP 6-18 cm water with 3 cm EPR and interface of the patient's choice, heated humidity.   Physician Name: Larey Seat, MD  06-07-2018          Sleep Summary  Oxygen Saturation Statistics   Start Study Time: End Study Time: Total Recording Time:         12:27:19 AM  8:12:28 AM        7 h, 45 min  Total Sleep Time % REM of Sleep Time:  6 h, 38 min  18.8    Mean: 95 Minimum: 85 Maximum: 100  Mean of Desaturations Nadirs (%):   92  Oxygen Desaturation %:   4-9 10-20 >20 Total  Events Number Total   309  5 98.4 1.6  0 0.0  314 100.0  Oxygen Saturation: <90 <=88 <85 <80 <70  Duration (minutes): Sleep % 3.6 0.9  1.6 0.0  0.4 0.0 0.0 0.0 0.0 0.0     Respiratory Indices      Total Events REM NREM All Night  pRDI:  424  pAHI:  424 ODI:  314  pAHIc:  14  % CSR: 0.0 79.4 79.4 65.8 1.6 61.2 61.2 43.7 2.3 64.7 64.7 47.9 2.1       Pulse Rate Statistics during Sleep (BPM)      Mean:  89 Minimum: 59  Maximum: 123    Indices are calculated using technically valid sleep time of 6 hr 33 min. pRDI/pAHI are calculated using oxygen desaturations ? 3% SpO2  Body Position Statistics  Position Supine Prone Right Left Non-Supine  Sleep (min) 222.5 54.0 104.0 15.0 173.0  Sleep % 55.9 13.6 26.1 3.8 43.5  pRDI 82.1 32.3 43.0 70.9 41.9  pAHI 82.1 32.3 43.0 70.9 41.9  ODI 60.4 25.7 33.1 48.7 32.0     Snoring Statistics Snoring Level (dB) >40 >50 >60 >70 >80 >Threshold (45)  Sleep (min) 392.8 314.4 102.5 0.5 0.0 347.3  Sleep % 98.7 79.0 25.7 0.1 0.0 87.3    Mean: 56 dB Sleep Stages Chart                                                                             pAHI=64.7                                                                                              Mild              Moderate                    Severe

## 2018-06-07 NOTE — Procedures (Signed)
The HST indicates the presence of severe Obstructive Sleep Apnea, at an AHI of 61.7/H. REM sleep further accentuated the AHI to 79.4/h. There were frequent oxygen desaturations, but these were not clinically sustained by total desaturation time.   Recommendations:      This degree of OSA is very severe, and poses risks for the patient's cardiovascular- , pulmonary- and cerebrovascular health.  Immediate CPAP therapy is recommended. I will order an autotitration CPAP 6-18 cm water with 3 cm EPR and interface of the patient's choice, heated humidity.  Physician Name: Larey Seat, MD  06-07-2018

## 2018-06-08 ENCOUNTER — Encounter: Payer: Self-pay | Admitting: Neurology

## 2018-06-08 ENCOUNTER — Telehealth: Payer: Self-pay | Admitting: Neurology

## 2018-06-08 NOTE — Telephone Encounter (Signed)
Called patient to discuss sleep study results. No answer at this time. LVM for the patient to call back.  Will send mychart message also

## 2018-06-08 NOTE — Telephone Encounter (Signed)
-----   Message from Larey Seat, MD sent at 06/07/2018  5:01 PM EDT ----- I already signed an order for auto CPAP, send to [facility] with NP in 3 month.

## 2018-06-10 ENCOUNTER — Encounter: Payer: Self-pay | Admitting: Neurology

## 2018-06-10 NOTE — Telephone Encounter (Signed)
Patient called and I was able to get the pt scheduled for follow up 08/11/18 at 8:30 with Ward Givens NP and informed her of the CPAP compliance with the machine. Patient had questions about insurance coverage and I deferred those questions for [employer] to help answer. Pt verbalized understanding. Pt had no questions at this time but was encouraged to call back if questions arise.

## 2018-06-16 ENCOUNTER — Ambulatory Visit (INDEPENDENT_AMBULATORY_CARE_PROVIDER_SITE_OTHER): Payer: Self-pay | Admitting: Family Medicine

## 2018-06-21 ENCOUNTER — Other Ambulatory Visit: Payer: Self-pay

## 2018-06-21 ENCOUNTER — Ambulatory Visit (INDEPENDENT_AMBULATORY_CARE_PROVIDER_SITE_OTHER): Payer: 59 | Admitting: Bariatrics

## 2018-06-21 DIAGNOSIS — E66813 Obesity, class 3: Secondary | ICD-10-CM

## 2018-06-21 DIAGNOSIS — E559 Vitamin D deficiency, unspecified: Secondary | ICD-10-CM | POA: Diagnosis not present

## 2018-06-21 DIAGNOSIS — Z6841 Body Mass Index (BMI) 40.0 and over, adult: Secondary | ICD-10-CM

## 2018-06-21 DIAGNOSIS — R7303 Prediabetes: Secondary | ICD-10-CM

## 2018-06-21 NOTE — Progress Notes (Signed)
Office: [phone]  /  Fax: [phone] TeleHealth Visit:  Mindy Collins has verbally consented to this TeleHealth visit today. The patient is located at home, the provider is located at the [employer] [facility] office. The participants in this visit include the listed provider and patient and any and all parties involved. The visit was conducted today via telephone. Mindy Collins was unable to use realtime audiovisual technology today and the telehealth visit was conducted via telephone.  HPI:   Chief Complaint: OBESITY Mindy Collins is here to discuss her progress with her obesity treatment plan. She is on the Category 3 plan and is following her eating plan approximately 50 % of the time. She states she is exercising to walking video for 10 minutes 5 times per week. [name] typically sees Dr. Leafy Ro. She states that her weight has stayed the same. She has done some minor stress eating. We were unable to weigh the patient today for this TeleHealth visit. She feels as if she has maintained weight since her last visit. She has lost 2 lbs since starting treatment with [place].  Pre-Diabetes Mindy Collins has a diagnosis of prediabetes based on her elevated Hgb A1c and was informed this puts her at greater risk of developing diabetes. Her last A1c was at 5.7 and last insulin level was at 32.3 She is taking metformin currently and continues to work on diet and exercise to decrease risk of diabetes. She denies nausea or hypoglycemia.  Vitamin D deficiency Mindy Collins has a diagnosis of vitamin D deficiency. Her last vitamin D level was at 13.7 She is currently taking vit D and denies nausea, vomiting or muscle weakness.  ASSESSMENT AND PLAN:  Prediabetes  Vitamin D deficiency  Class 3 severe obesity with serious comorbidity and body mass index (BMI) of 50.0 to 59.9 in adult, unspecified obesity type ([place])  PLAN:  Pre-Diabetes Mindy Collins will continue to work on weight loss, exercise, increasing lean protein and  decreasing simple carbohydrates in her diet to help decrease the risk of diabetes. We dicussed metformin including benefits and risks. She was informed that eating too many simple carbohydrates or too many calories at one sitting increases the likelihood of GI side effects. Mindy Collins will continue metformin for now and a prescription was not written today. Mindy Collins agreed to follow up with [place] as directed to monitor her progress.  Vitamin D Deficiency Mindy Collins was informed that low vitamin D levels contributes to fatigue and are associated with obesity, breast, and colon cancer. She agrees to continue to take prescription Vit D @50 ,000 IU every week and will follow up for routine testing of vitamin D, at least 2-3 times per year. She was informed of the risk of over-replacement of vitamin D and agrees to not increase her dose unless she discusses this with [place] first.  Obesity Mindy Collins is currently in the action stage of change. As such, her goal is to continue with weight loss efforts She has agreed to follow the Category 3 plan Mindy Collins will continue exercise and she will increase exercise for weight loss and overall health benefits. We discussed the following Behavioral Modification Strategies today: planning for success, increase H2O intake, no skipping meals, keeping healthy foods in the home, better snacking choices, increasing lean protein intake, decreasing simple carbohydrates, increasing vegetables, decrease eating out, work on meal planning and easy cooking plans, emotional eating strategies, ways to avoid boredom eating and ways to avoid night time snacking Mindy Collins will weigh herself at home before each visit. She will save  calories from snacks.  Mindy Collins has agreed to follow up with our clinic in 2 weeks. She was informed of the importance of frequent follow up visits to maximize her success with intensive lifestyle modifications for her multiple health conditions.  ALLERGIES: Allergies  Allergen Reactions  .  Esomeprazole Magnesium Shortness Of Breath    Nervousness; tolerates Aciphex.  . Adhesive [Tape] Itching and Rash    Itching and rash with adhesive tape  . Latex Itching and Dermatitis    Skin blistering  . Levocetirizine Dihydrochloride Other (See Comments)    Muscle jerks  . Nexium [Esomeprazole]     MEDICATIONS: Current Outpatient Medications on File Prior to Visit  Medication Sig Dispense Refill  . cyclobenzaprine (FLEXERIL) 10 MG tablet TAKE 1 TABLET BY MOUTH THREE TIMES A DAY IF NEEDED FOR MUSCLE SPASM 30 tablet 1  . fluticasone (FLONASE) 50 MCG/ACT nasal spray instill 1 spray into each nostril twice a day  0  . levonorgestrel (MIRENA) 20 MCG/24HR IUD 1 each by Intrauterine route once.    . loratadine (CLARITIN) 10 MG tablet Take 10 mg by mouth daily.    . meloxicam (MOBIC) 15 MG tablet take 1 tablet by mouth daily if needed  0  . metFORMIN (GLUCOPHAGE) 500 MG tablet Take 1 tablet (500 mg total) by mouth daily with breakfast. 30 tablet 0  . olmesartan-hydrochlorothiazide (BENICAR HCT) 20-12.5 MG tablet Take 1 tablet by mouth daily. 90 tablet 1  . vitamin B-12 (CYANOCOBALAMIN) 1000 MCG tablet Take 3,000 mcg by mouth daily.     . Vitamin D, Ergocalciferol, (DRISDOL) 1.25 MG (50000 UT) CAPS capsule Take 1 capsule (50,000 Units total) by mouth every 7 (seven) days. 4 capsule 0   No current facility-administered medications on file prior to visit.     PAST MEDICAL HISTORY: Past Medical History:  Diagnosis Date  . Abdominal wall seroma   . Anemia   . Arthritis   . Asthma   . B12 deficiency   . Back pain   . Carpal tunnel syndrome, bilateral   . Chest pain   . Colitis   . Constipation   . Dysmenorrhea   . Dyspnea   . Fatty liver   . GERD (gastroesophageal reflux disease)   . HLD (hyperlipidemia)   . HPV (human papilloma virus) infection    WARTS/ TCA TX  . Hypertension   . Keratoconus   . Leg edema   . Obesity   . Obesity   . Osteoarthritis   . Palpitations   .  Prediabetes   . Seasonal allergies     PAST SURGICAL HISTORY: Past Surgical History:  Procedure Laterality Date  . GYNECOLOGIC CRYOSURGERY  1993   DYSPLASIA CERVIX/   . MYOMECTOMY  02/17/2011   Procedure: MYOMECTOMY;  Surgeon: Terrance Mass, MD;  Location: [facility];  Service: Gynecology;  Laterality: N/A;  I am hoping to get a 7:30am time on Dec 4, Dec 11 or Dec 12. It not available I will check on some 1:00 pm times. Thanks  . MYOMECTOMY ABDOMINAL APPROACH  04/31/2007  . OVARIAN CYST REMOVAL  02/17/2011   Procedure: OVARIAN CYSTECTOMY;  Surgeon: Terrance Mass, MD;  Location: [facility];  Service: Gynecology;  Laterality: Left;    SOCIAL HISTORY: Social History   Tobacco Use  . Smoking status: Never Smoker  . Smokeless tobacco: Never Used  . Tobacco comment: denies tobacco   Substance Use Topics  . Alcohol use: No    Alcohol/week: 0.0 standard  drinks  . Drug use: No    FAMILY HISTORY: Family History  Problem Relation Age of Onset  . Hypertension Mother   . Breast cancer Mother   . Cancer Mother   . Hyperlipidemia Mother   . Thyroid disease Mother   . Obesity Mother   . Heart disease Maternal Grandmother   . Cancer Father        LUNG  . Breast cancer Maternal Aunt   . Diabetes Maternal Grandfather   . Cancer Paternal Aunt   . Ovarian cancer Paternal Aunt     ROS: Review of Systems  Constitutional: Negative for weight loss.  Gastrointestinal: Negative for nausea and vomiting.  Musculoskeletal:       Negative for muscle weakness  Endo/Heme/Allergies:       Negative for hypoglycemia    PHYSICAL EXAM: Pt in no acute distress  RECENT LABS AND TESTS: BMET    Component Value Date/Time   NA 146 (H) 12/21/2017 0936   K 4.4 12/21/2017 0936   CL 102 12/21/2017 0936   CO2 27 12/21/2017 0936   GLUCOSE 91 12/21/2017 0936   GLUCOSE 99 02/22/2016 1206   BUN 11 12/21/2017 0936   CREATININE 0.83 12/21/2017 0936   CREATININE 0.75 02/22/2016 1206   CALCIUM 9.7  12/21/2017 0936   GFRNONAA 85 12/21/2017 0936   GFRAA 98 12/21/2017 0936   Lab Results  Component Value Date   HGBA1C 5.9 (H) 12/21/2017   HGBA1C 6.0 (H) 09/07/2017   HGBA1C 5.9 (H) 03/05/2017   HGBA1C 6.0 (H) 09/27/2016   HGBA1C 5.7 (H) 01/21/2014   Lab Results  Component Value Date   INSULIN 32.3 (H) 12/21/2017   INSULIN 14.8 09/07/2017   CBC    Component Value Date/Time   WBC 12.0 (H) 12/21/2017 0936   WBC 9.3 02/22/2016 1206   RBC 5.17 12/21/2017 0936   RBC 4.84 02/22/2016 1206   HGB 13.7 12/21/2017 0936   HGB 11.5 (L) 02/14/2011 1218   HCT 42.0 12/21/2017 0936   HCT 35.4 02/14/2011 1218   PLT 417 (H) 03/05/2017 1754   MCV 81 12/21/2017 0936   MCV 83.2 02/14/2011 1218   MCH 26.5 (L) 12/21/2017 0936   MCH 26.4 (L) 02/22/2016 1206   MCHC 32.6 12/21/2017 0936   MCHC 31.7 (L) 02/22/2016 1206   RDW 13.4 12/21/2017 0936   RDW 15.2 (H) 02/14/2011 1218   LYMPHSABS 2.2 12/21/2017 0936   LYMPHSABS 1.7 02/14/2011 1218   MONOABS 651 02/22/2016 1206   MONOABS 0.8 02/14/2011 1218   EOSABS 0.1 12/21/2017 0936   BASOSABS 0.1 12/21/2017 0936   BASOSABS 0.1 02/14/2011 1218   Iron/TIBC/Ferritin/ %Sat No results found for: IRON, TIBC, FERRITIN, IRONPCTSAT Lipid Panel     Component Value Date/Time   CHOL 242 (H) 12/21/2017 0936   TRIG 124 12/21/2017 0936   HDL 48 12/21/2017 0936   CHOLHDL 4.6 (H) 03/05/2017 1754   CHOLHDL 4.8 02/22/2016 1206   VLDL 21 02/22/2016 1206   LDLCALC 169 (H) 12/21/2017 0936   LDLDIRECT 141.6 04/16/2010 0743   Hepatic Function Panel     Component Value Date/Time   PROT 7.1 12/21/2017 0936   ALBUMIN 4.2 12/21/2017 0936   AST 29 12/21/2017 0936   ALT 35 (H) 12/21/2017 0936   ALKPHOS 127 (H) 12/21/2017 0936   BILITOT 0.5 12/21/2017 0936      Component Value Date/Time   TSH 3.190 09/07/2017 0917   TSH 2.190 03/05/2017 1754   TSH 1.72 02/22/2016 1206  Results for MITZIE, MARLAR (MRN [id number]) as of 06/21/2018 15:45  Ref. Range  12/21/2017 09:36  Vitamin D, 25-Hydroxy Latest Ref Range: 30.0 - 100.0 ng/mL 13.7 (L)    I, Doreene Nest, am acting as Location manager for [employer]. Owens Shark, DO  I have reviewed the above documentation for accuracy and completeness, and I agree with the above. -Jearld Lesch, DO

## 2018-06-22 ENCOUNTER — Encounter (INDEPENDENT_AMBULATORY_CARE_PROVIDER_SITE_OTHER): Payer: Self-pay | Admitting: Bariatrics

## 2018-06-22 DIAGNOSIS — Z6841 Body Mass Index (BMI) 40.0 and over, adult: Secondary | ICD-10-CM | POA: Insufficient documentation

## 2018-07-01 ENCOUNTER — Encounter (INDEPENDENT_AMBULATORY_CARE_PROVIDER_SITE_OTHER): Payer: Self-pay | Admitting: Family Medicine

## 2018-07-01 ENCOUNTER — Telehealth (INDEPENDENT_AMBULATORY_CARE_PROVIDER_SITE_OTHER): Payer: Self-pay | Admitting: Bariatrics

## 2018-07-01 ENCOUNTER — Other Ambulatory Visit (INDEPENDENT_AMBULATORY_CARE_PROVIDER_SITE_OTHER): Payer: Self-pay | Admitting: Family Medicine

## 2018-07-01 DIAGNOSIS — E559 Vitamin D deficiency, unspecified: Secondary | ICD-10-CM

## 2018-07-01 DIAGNOSIS — R7303 Prediabetes: Secondary | ICD-10-CM

## 2018-07-06 ENCOUNTER — Ambulatory Visit (INDEPENDENT_AMBULATORY_CARE_PROVIDER_SITE_OTHER): Payer: 59 | Admitting: Family Medicine

## 2018-07-06 ENCOUNTER — Other Ambulatory Visit: Payer: Self-pay

## 2018-07-06 ENCOUNTER — Encounter (INDEPENDENT_AMBULATORY_CARE_PROVIDER_SITE_OTHER): Payer: Self-pay | Admitting: Family Medicine

## 2018-07-06 DIAGNOSIS — Z6841 Body Mass Index (BMI) 40.0 and over, adult: Secondary | ICD-10-CM | POA: Diagnosis not present

## 2018-07-06 DIAGNOSIS — R7303 Prediabetes: Secondary | ICD-10-CM

## 2018-07-06 DIAGNOSIS — E559 Vitamin D deficiency, unspecified: Secondary | ICD-10-CM | POA: Diagnosis not present

## 2018-07-06 MED ORDER — METFORMIN HCL 500 MG PO TABS: 500.0000 mg | ORAL_TABLET | Freq: Every day | ORAL | 0 refills | Status: DC

## 2018-07-06 MED ORDER — VITAMIN D (ERGOCALCIFEROL) 1.25 MG (50000 UNIT) PO CAPS: 50000.0000 [IU] | ORAL_CAPSULE | ORAL | 0 refills | Status: DC

## 2018-07-07 NOTE — Progress Notes (Signed)
Office: [phone]  /  Fax: [phone] TeleHealth Visit:  Jayli Fogleman has verbally consented to this TeleHealth visit today. The patient is located at home, the provider is located at the [employer] [facility] office. The participants in this visit include the listed provider and patient and any and all parties involved. The visit was conducted today via telephone. Saliah was unable to use realtime audiovisual technology today and the telehealth visit was conducted via telephone.  HPI:   Chief Complaint: OBESITY Sadeen is here to discuss her progress with her obesity treatment plan. She is on the Category 3 plan and is following her eating plan approximately 10 % of the time. She states she is doing "walk away the pounds" for 10 minutes 7 times per week. Mahika feels she has done well maintaining her weight, but she has bot been following her plan closely. She is more sedentary, working from home, but she has tried to add exercise. She is starting to sleep better as she is now in a routine with her CPAP. We were unable to weigh the patient today for this TeleHealth visit. She feels as if she has maintained weight since her last visit. She has lost 2 lbs since starting treatment with [place].  Vitamin D deficiency Vara has a diagnosis of vitamin D deficiency. Lejla is stable on vit D but she is not yet at goal. She denies nausea, vomiting or muscle weakness.  Pre-Diabetes Ellamay has a diagnosis of prediabetes based on her elevated Hgb A1c and was informed this puts her at greater risk of developing diabetes. She is stable on metformin and she continues to work on diet and exercise to decrease risk of diabetes. She denies nausea, vomiting or hypoglycemia.  ASSESSMENT AND PLAN:  Vitamin D deficiency - Plan: Vitamin D, Ergocalciferol, (DRISDOL) 1.25 MG (50000 UT) CAPS capsule  Prediabetes - Plan: metFORMIN (GLUCOPHAGE) 500 MG tablet  Class 3 severe obesity with serious comorbidity and body  mass index (BMI) of 50.0 to 59.9 in adult, unspecified obesity type ([place])  PLAN:  Vitamin D Deficiency Cloris was informed that low vitamin D levels contributes to fatigue and are associated with obesity, breast, and colon cancer. She agrees to continue to take prescription Vit D @50 ,000 IU every week #4 with no refills and will follow up for routine testing of vitamin D, at least 2-3 times per year. She was informed of the risk of over-replacement of vitamin D and agrees to not increase her dose unless she discusses this with [place] first. Oprah agrees to follow up as directed.  Pre-Diabetes Mckynlee will continue to work on weight loss, exercise, and decreasing simple carbohydrates in her diet to help decrease the risk of diabetes. She was informed that eating too many simple carbohydrates or too many calories at one sitting increases the likelihood of GI side effects. Kathia agreed to continue metformin 500 mg daily with breakfast #30 with no refills and follow up with [place] as directed to monitor her progress.  Obesity Malini is currently in the action stage of change. As such, her goal is to continue with weight loss efforts She has agreed to follow the Category 3 plan Dazja has been instructed to work up to a goal of 150 minutes of combined cardio and strengthening exercise per week for weight loss and overall health benefits. We discussed the following Behavioral Modification Strategies today: increasing lean protein intake, decreasing simple carbohydrates, increasing vegetables and work on meal planning and easy cooking plans  Ercel has agreed to follow up with our clinic in 2 weeks. She was informed of the importance of frequent follow up visits to maximize her success with intensive lifestyle modifications for her multiple health conditions.  ALLERGIES: Allergies  Allergen Reactions   Esomeprazole Magnesium Shortness Of Breath    Nervousness; tolerates Aciphex.   Adhesive [Tape] Itching and  Rash    Itching and rash with adhesive tape   Latex Itching and Dermatitis    Skin blistering   Levocetirizine Dihydrochloride Other (See Comments)    Muscle jerks   Nexium [Esomeprazole]     MEDICATIONS: Current Outpatient Medications on File Prior to Visit  Medication Sig Dispense Refill   cyclobenzaprine (FLEXERIL) 10 MG tablet TAKE 1 TABLET BY MOUTH THREE TIMES A DAY IF NEEDED FOR MUSCLE SPASM 30 tablet 1   fluticasone (FLONASE) 50 MCG/ACT nasal spray instill 1 spray into each nostril twice a day  0   levonorgestrel (MIRENA) 20 MCG/24HR IUD 1 each by Intrauterine route once.     loratadine (CLARITIN) 10 MG tablet Take 10 mg by mouth daily.     meloxicam (MOBIC) 15 MG tablet take 1 tablet by mouth daily if needed  0   olmesartan-hydrochlorothiazide (BENICAR HCT) 20-12.5 MG tablet Take 1 tablet by mouth daily. 90 tablet 1   vitamin B-12 (CYANOCOBALAMIN) 1000 MCG tablet Take 3,000 mcg by mouth daily.      No current facility-administered medications on file prior to visit.     PAST MEDICAL HISTORY: Past Medical History:  Diagnosis Date   Abdominal wall seroma    Anemia    Arthritis    Asthma    B12 deficiency    Back pain    Carpal tunnel syndrome, bilateral    Chest pain    Colitis    Constipation    Dysmenorrhea    Dyspnea    Fatty liver    GERD (gastroesophageal reflux disease)    HLD (hyperlipidemia)    HPV (human papilloma virus) infection    WARTS/ TCA TX   Hypertension    Keratoconus    Leg edema    Obesity    Obesity    Osteoarthritis    Palpitations    Prediabetes    Seasonal allergies     PAST SURGICAL HISTORY: Past Surgical History:  Procedure Laterality Date   GYNECOLOGIC CRYOSURGERY  1993   DYSPLASIA CERVIX/    MYOMECTOMY  02/17/2011   Procedure: MYOMECTOMY;  Surgeon: Terrance Mass, MD;  Location: [facility];  Service: Gynecology;  Laterality: N/A;  I am hoping to get a 7:30am time on Dec 4, Dec 11 or Dec 12.  It not available I will check on some 1:00 pm times. Thanks   MYOMECTOMY ABDOMINAL APPROACH  04/31/2007   OVARIAN CYST REMOVAL  02/17/2011   Procedure: OVARIAN CYSTECTOMY;  Surgeon: Terrance Mass, MD;  Location: [facility];  Service: Gynecology;  Laterality: Left;    SOCIAL HISTORY: Social History   Tobacco Use   Smoking status: Never Smoker   Smokeless tobacco: Never Used   Tobacco comment: denies tobacco   Substance Use Topics   Alcohol use: No    Alcohol/week: 0.0 standard drinks   Drug use: No    FAMILY HISTORY: Family History  Problem Relation Age of Onset   Hypertension Mother    Breast cancer Mother    Cancer Mother    Hyperlipidemia Mother    Thyroid disease Mother    Obesity Mother  Heart disease Maternal Grandmother    Cancer Father        LUNG   Breast cancer Maternal Aunt    Diabetes Maternal Grandfather    Cancer Paternal Aunt    Ovarian cancer Paternal Aunt     ROS: Review of Systems  Constitutional: Negative for weight loss.  Gastrointestinal: Negative for nausea and vomiting.  Musculoskeletal:       Negative for muscle weakness  Endo/Heme/Allergies:       Negative for hypoglycemia  Psychiatric/Behavioral: The patient does not have insomnia.     PHYSICAL EXAM: Pt in no acute distress  RECENT LABS AND TESTS: BMET    Component Value Date/Time   NA 146 (H) 12/21/2017 0936   K 4.4 12/21/2017 0936   CL 102 12/21/2017 0936   CO2 27 12/21/2017 0936   GLUCOSE 91 12/21/2017 0936   GLUCOSE 99 02/22/2016 1206   BUN 11 12/21/2017 0936   CREATININE 0.83 12/21/2017 0936   CREATININE 0.75 02/22/2016 1206   CALCIUM 9.7 12/21/2017 0936   GFRNONAA 85 12/21/2017 0936   GFRAA 98 12/21/2017 0936   Lab Results  Component Value Date   HGBA1C 5.9 (H) 12/21/2017   HGBA1C 6.0 (H) 09/07/2017   HGBA1C 5.9 (H) 03/05/2017   HGBA1C 6.0 (H) 09/27/2016   HGBA1C 5.7 (H) 01/21/2014   Lab Results  Component Value Date   INSULIN 32.3 (H)  12/21/2017   INSULIN 14.8 09/07/2017   CBC    Component Value Date/Time   WBC 12.0 (H) 12/21/2017 0936   WBC 9.3 02/22/2016 1206   RBC 5.17 12/21/2017 0936   RBC 4.84 02/22/2016 1206   HGB 13.7 12/21/2017 0936   HGB 11.5 (L) 02/14/2011 1218   HCT 42.0 12/21/2017 0936   HCT 35.4 02/14/2011 1218   PLT 417 (H) 03/05/2017 1754   MCV 81 12/21/2017 0936   MCV 83.2 02/14/2011 1218   MCH 26.5 (L) 12/21/2017 0936   MCH 26.4 (L) 02/22/2016 1206   MCHC 32.6 12/21/2017 0936   MCHC 31.7 (L) 02/22/2016 1206   RDW 13.4 12/21/2017 0936   RDW 15.2 (H) 02/14/2011 1218   LYMPHSABS 2.2 12/21/2017 0936   LYMPHSABS 1.7 02/14/2011 1218   MONOABS 651 02/22/2016 1206   MONOABS 0.8 02/14/2011 1218   EOSABS 0.1 12/21/2017 0936   BASOSABS 0.1 12/21/2017 0936   BASOSABS 0.1 02/14/2011 1218   Iron/TIBC/Ferritin/ %Sat No results found for: IRON, TIBC, FERRITIN, IRONPCTSAT Lipid Panel     Component Value Date/Time   CHOL 242 (H) 12/21/2017 0936   TRIG 124 12/21/2017 0936   HDL 48 12/21/2017 0936   CHOLHDL 4.6 (H) 03/05/2017 1754   CHOLHDL 4.8 02/22/2016 1206   VLDL 21 02/22/2016 1206   LDLCALC 169 (H) 12/21/2017 0936   LDLDIRECT 141.6 04/16/2010 0743   Hepatic Function Panel     Component Value Date/Time   PROT 7.1 12/21/2017 0936   ALBUMIN 4.2 12/21/2017 0936   AST 29 12/21/2017 0936   ALT 35 (H) 12/21/2017 0936   ALKPHOS 127 (H) 12/21/2017 0936   BILITOT 0.5 12/21/2017 0936      Component Value Date/Time   TSH 3.190 09/07/2017 0917   TSH 2.190 03/05/2017 1754   TSH 1.72 02/22/2016 1206     Ref. Range 12/21/2017 09:36  Vitamin D, 25-Hydroxy Latest Ref Range: 30.0 - 100.0 ng/mL 13.7 (L)    I, Doreene Nest, am acting as Location manager for Dennard Nip, MD I have reviewed the above documentation for accuracy and  completeness, and I agree with the above. -Dennard Nip, MD

## 2018-07-08 ENCOUNTER — Encounter: Payer: 59 | Admitting: Obstetrics & Gynecology

## 2018-07-15 NOTE — Telephone Encounter (Signed)
Erroneous entry

## 2018-07-16 ENCOUNTER — Encounter (INDEPENDENT_AMBULATORY_CARE_PROVIDER_SITE_OTHER): Payer: Self-pay | Admitting: Family Medicine

## 2018-07-19 ENCOUNTER — Ambulatory Visit (INDEPENDENT_AMBULATORY_CARE_PROVIDER_SITE_OTHER): Payer: 59 | Admitting: Family Medicine

## 2018-08-02 ENCOUNTER — Encounter (INDEPENDENT_AMBULATORY_CARE_PROVIDER_SITE_OTHER): Payer: Self-pay | Admitting: Family Medicine

## 2018-08-02 ENCOUNTER — Ambulatory Visit (INDEPENDENT_AMBULATORY_CARE_PROVIDER_SITE_OTHER): Payer: 59 | Admitting: Family Medicine

## 2018-08-02 ENCOUNTER — Other Ambulatory Visit: Payer: Self-pay

## 2018-08-02 DIAGNOSIS — R7303 Prediabetes: Secondary | ICD-10-CM

## 2018-08-02 DIAGNOSIS — Z20828 Contact with and (suspected) exposure to other viral communicable diseases: Secondary | ICD-10-CM | POA: Diagnosis not present

## 2018-08-02 DIAGNOSIS — Z20822 Contact with and (suspected) exposure to covid-19: Secondary | ICD-10-CM

## 2018-08-02 DIAGNOSIS — E559 Vitamin D deficiency, unspecified: Secondary | ICD-10-CM

## 2018-08-02 DIAGNOSIS — Z6841 Body Mass Index (BMI) 40.0 and over, adult: Secondary | ICD-10-CM

## 2018-08-02 MED ORDER — VITAMIN D (ERGOCALCIFEROL) 1.25 MG (50000 UNIT) PO CAPS: 50000.0000 [IU] | ORAL_CAPSULE | ORAL | 0 refills | Status: DC

## 2018-08-02 MED ORDER — METFORMIN HCL 500 MG PO TABS: 500.0000 mg | ORAL_TABLET | Freq: Every day | ORAL | 0 refills | Status: DC

## 2018-08-03 NOTE — Progress Notes (Signed)
Office: [phone]  /  Fax: [phone] TeleHealth Visit:  Mindy Collins has verbally consented to this TeleHealth visit today. The patient is located at home, the provider is located at the [employer] [facility] office. The participants in this visit include the listed provider and patient and any and all parties involved. The visit was conducted today via FaceTime.  HPI:   Chief Complaint: OBESITY Mindy Collins is here to discuss her progress with her obesity treatment plan. She is on the Category 3 plan and is following her eating plan approximately 50 % of the time. She states she is walking for 10 minutes daily. Mindy Collins hasn't been concentrating on weight loss so much, for the last month. She thinks she has gained weight during this time and she at least wants to stop gaining weight. She is not sure she can follow a structured plan for a couple more weeks due to increased stress. We were unable to weigh the patient today for this TeleHealth visit. She feels as if she has gained weight since her last visit. She has lost 2 lbs since starting treatment with [place].  Vitamin D deficiency Mindy Collins has a diagnosis of vitamin D deficiency. Mindy Collins is stable on vit D and she denies nausea, vomiting or muscle weakness.  Exposure to Covid-19 Virus Mindy Collins had exposure to someone who was Covid-19 positive, but she didn't know it at that time. A few days later she had a cough and sore throat and some shortness of breath, but no fever. She asks about what she should do. Mindy Collins is feeling better now.  Pre-Diabetes [name] has a diagnosis of prediabetes based on her elevated Hgb A1c and was informed this puts her at greater risk of developing diabetes. She is stable on metformin and her last A1c was at 5.9. [name] is working on diet somewhat to decrease risk of diabetes. She denies nausea, vomiting or hypoglycemia.  ASSESSMENT AND PLAN:  Vitamin D deficiency - Plan: Vitamin D, Ergocalciferol, (DRISDOL) 1.25 MG (50000  UT) CAPS capsule  Exposure to Covid-19 Virus  Prediabetes - Plan: metFORMIN (GLUCOPHAGE) 500 MG tablet  Class 3 severe obesity with serious comorbidity and body mass index (BMI) of 50.0 to 59.9 in adult, unspecified obesity type ([place])  PLAN:  Vitamin D Deficiency Mindy Collins was informed that low vitamin D levels contributes to fatigue and are associated with obesity, breast, and colon cancer. She agrees to continue to take prescription Vit D @50 ,000 IU every week #4 with no refills and will follow up for routine testing of vitamin D, at least 2-3 times per year. She was informed of the risk of over-replacement of vitamin D and agrees to not increase her dose unless she discusses this with [place] first. Mindy Collins agrees to follow up as directed.  Exposure to Covid-19 Virus [name] was instructed to get a Covid-19 test today and to isolate until her results are back. Patient agreed to do so.  Pre-Diabetes Mindy Collins will continue to work on weight loss, exercise, and decreasing simple carbohydrates in her diet to help decrease the risk of diabetes. We dicussed metformin including benefits and risks. She was informed that eating too many simple carbohydrates or too many calories at one sitting increases the likelihood of GI side effects. Mindy Collins agrees to continue metformin 500 mg daily with breakfast #30 with no refills and we will check labs at the next in-office visit. Mindy Collins agrees to follow up with [place] as directed to monitor her progress.  Obesity Amberlie is currently  in the action stage of change. As such, her goal is to maintain weight for now and we will re-evaluate at the next visit She has agreed to follow the Category 3 plan Natassia has been instructed to work up to a goal of 150 minutes of combined cardio and strengthening exercise per week for weight loss and overall health benefits. We discussed the following Behavioral Modification Strategies today: planning for success, increasing lean protein intake,  decreasing simple carbohydrates, emotional eating strategies and avoiding temptations  Mindy Collins has agreed to follow up with our clinic in 3 weeks. She was informed of the importance of frequent follow up visits to maximize her success with intensive lifestyle modifications for her multiple health conditions.  ALLERGIES: Allergies  Allergen Reactions  . Esomeprazole Magnesium Shortness Of Breath    Nervousness; tolerates Aciphex.  . Adhesive [Tape] Itching and Rash    Itching and rash with adhesive tape  . Latex Itching and Dermatitis    Skin blistering  . Levocetirizine Dihydrochloride Other (See Comments)    Muscle jerks  . Nexium [Esomeprazole]     MEDICATIONS: Current Outpatient Medications on File Prior to Visit  Medication Sig Dispense Refill  . cyclobenzaprine (FLEXERIL) 10 MG tablet TAKE 1 TABLET BY MOUTH THREE TIMES A DAY IF NEEDED FOR MUSCLE SPASM 30 tablet 1  . fluticasone (FLONASE) 50 MCG/ACT nasal spray instill 1 spray into each nostril twice a day  0  . levonorgestrel (MIRENA) 20 MCG/24HR IUD 1 each by Intrauterine route once.    . loratadine (CLARITIN) 10 MG tablet Take 10 mg by mouth daily.    . meloxicam (MOBIC) 15 MG tablet take 1 tablet by mouth daily if needed  0  . olmesartan-hydrochlorothiazide (BENICAR HCT) 20-12.5 MG tablet Take 1 tablet by mouth daily. 90 tablet 1  . vitamin B-12 (CYANOCOBALAMIN) 1000 MCG tablet Take 3,000 mcg by mouth daily.      No current facility-administered medications on file prior to visit.     PAST MEDICAL HISTORY: Past Medical History:  Diagnosis Date  . Abdominal wall seroma   . Anemia   . Arthritis   . Asthma   . B12 deficiency   . Back pain   . Carpal tunnel syndrome, bilateral   . Chest pain   . Colitis   . Constipation   . Dysmenorrhea   . Dyspnea   . Fatty liver   . GERD (gastroesophageal reflux disease)   . HLD (hyperlipidemia)   . HPV (human papilloma virus) infection    WARTS/ TCA [place]  . Hypertension   .  Keratoconus   . Leg edema   . Obesity   . Obesity   . Osteoarthritis   . Palpitations   . Prediabetes   . Seasonal allergies     PAST SURGICAL HISTORY: Past Surgical History:  Procedure Laterality Date  . GYNECOLOGIC CRYOSURGERY  1993   DYSPLASIA CERVIX/   . MYOMECTOMY  02/17/2011   Procedure: MYOMECTOMY;  Surgeon: Terrance Mass, MD;  Location: [facility];  Service: Gynecology;  Laterality: N/A;  I am hoping to get a 7:30am time on Dec 4, Dec 11 or Dec 12. It not available I will check on some 1:00 pm times. Thanks  . MYOMECTOMY ABDOMINAL APPROACH  04/31/2007  . OVARIAN CYST REMOVAL  02/17/2011   Procedure: OVARIAN CYSTECTOMY;  Surgeon: Terrance Mass, MD;  Location: [facility];  Service: Gynecology;  Laterality: Left;    SOCIAL HISTORY: Social History   Tobacco Use  .  Smoking status: Never Smoker  . Smokeless tobacco: Never Used  . Tobacco comment: denies tobacco   Substance Use Topics  . Alcohol use: No    Alcohol/week: 0.0 standard drinks  . Drug use: No    FAMILY HISTORY: Family History  Problem Relation Age of Onset  . Hypertension Mother   . Breast cancer Mother   . Cancer Mother   . Hyperlipidemia Mother   . Thyroid disease Mother   . Obesity Mother   . Heart disease Maternal Grandmother   . Cancer Father        LUNG  . Breast cancer Maternal Aunt   . Diabetes Maternal Grandfather   . Cancer Paternal Aunt   . Ovarian cancer Paternal Aunt     ROS: Review of Systems  Constitutional: Negative for weight loss.  Gastrointestinal: Negative for nausea and vomiting.  Musculoskeletal:       Negative for muscle weakness  Endo/Heme/Allergies:       Negative for hypoglycemia    PHYSICAL EXAM: Pt in no acute distress  RECENT LABS AND TESTS: BMET    Component Value Date/Time   NA 146 (H) 12/21/2017 0936   K 4.4 12/21/2017 0936   CL 102 12/21/2017 0936   CO2 27 12/21/2017 0936   GLUCOSE 91 12/21/2017 0936   GLUCOSE 99 02/22/2016 1206   BUN 11 12/21/2017  0936   CREATININE 0.83 12/21/2017 0936   CREATININE 0.75 02/22/2016 1206   CALCIUM 9.7 12/21/2017 0936   GFRNONAA 85 12/21/2017 0936   GFRAA 98 12/21/2017 0936   Lab Results  Component Value Date   HGBA1C 5.9 (H) 12/21/2017   HGBA1C 6.0 (H) 09/07/2017   HGBA1C 5.9 (H) 03/05/2017   HGBA1C 6.0 (H) 09/27/2016   HGBA1C 5.7 (H) 01/21/2014   Lab Results  Component Value Date   INSULIN 32.3 (H) 12/21/2017   INSULIN 14.8 09/07/2017   CBC    Component Value Date/Time   WBC 12.0 (H) 12/21/2017 0936   WBC 9.3 02/22/2016 1206   RBC 5.17 12/21/2017 0936   RBC 4.84 02/22/2016 1206   HGB 13.7 12/21/2017 0936   HGB 11.5 (L) 02/14/2011 1218   HCT 42.0 12/21/2017 0936   HCT 35.4 02/14/2011 1218   PLT 417 (H) 03/05/2017 1754   MCV 81 12/21/2017 0936   MCV 83.2 02/14/2011 1218   MCH 26.5 (L) 12/21/2017 0936   MCH 26.4 (L) 02/22/2016 1206   MCHC 32.6 12/21/2017 0936   MCHC 31.7 (L) 02/22/2016 1206   RDW 13.4 12/21/2017 0936   RDW 15.2 (H) 02/14/2011 1218   LYMPHSABS 2.2 12/21/2017 0936   LYMPHSABS 1.7 02/14/2011 1218   MONOABS 651 02/22/2016 1206   MONOABS 0.8 02/14/2011 1218   EOSABS 0.1 12/21/2017 0936   BASOSABS 0.1 12/21/2017 0936   BASOSABS 0.1 02/14/2011 1218   Iron/TIBC/Ferritin/ %Sat No results found for: IRON, TIBC, FERRITIN, IRONPCTSAT Lipid Panel     Component Value Date/Time   CHOL 242 (H) 12/21/2017 0936   TRIG 124 12/21/2017 0936   HDL 48 12/21/2017 0936   CHOLHDL 4.6 (H) 03/05/2017 1754   CHOLHDL 4.8 02/22/2016 1206   VLDL 21 02/22/2016 1206   LDLCALC 169 (H) 12/21/2017 0936   LDLDIRECT 141.6 04/16/2010 0743   Hepatic Function Panel     Component Value Date/Time   PROT 7.1 12/21/2017 0936   ALBUMIN 4.2 12/21/2017 0936   AST 29 12/21/2017 0936   ALT 35 (H) 12/21/2017 0936   ALKPHOS 127 (H) 12/21/2017 4818  BILITOT 0.5 12/21/2017 0936      Component Value Date/Time   TSH 3.190 09/07/2017 0917   TSH 2.190 03/05/2017 1754   TSH 1.72 02/22/2016 1206      Ref. Range 12/21/2017 09:36  Vitamin D, 25-Hydroxy Latest Ref Range: 30.0 - 100.0 ng/mL 13.7 (L)   I, Doreene Nest, am acting as transcriptionist for Dennard Nip, MD I have reviewed the above documentation for accuracy and completeness, and I agree with the above. -Dennard Nip, MD

## 2018-08-11 ENCOUNTER — Other Ambulatory Visit: Payer: Self-pay

## 2018-08-11 ENCOUNTER — Ambulatory Visit (INDEPENDENT_AMBULATORY_CARE_PROVIDER_SITE_OTHER): Payer: 59 | Admitting: Adult Health

## 2018-08-11 ENCOUNTER — Encounter: Payer: Self-pay | Admitting: Adult Health

## 2018-08-11 VITALS — BP 122/72 | HR 86 | Temp 97.3°F | Ht 63.0 in | Wt 290.0 lb

## 2018-08-11 DIAGNOSIS — G4733 Obstructive sleep apnea (adult) (pediatric): Secondary | ICD-10-CM

## 2018-08-11 DIAGNOSIS — Z9989 Dependence on other enabling machines and devices: Secondary | ICD-10-CM

## 2018-08-11 NOTE — Progress Notes (Signed)
PATIENT: Mindy Collins DOB: 01/20/1971  REASON FOR VISIT: follow up HISTORY FROM: patient  HISTORY OF PRESENT ILLNESS: Today 08/11/18:  Ms. Mindy Collins is a 48 year old female with a history of obstructive sleep apnea on CPAP.  She returns today for follow-up.  Her CPAP download indicates that she use her machine 30 out of 30 days for compliance of 100%.  She used her machine 27 out of 30 days for compliance of 90%.  On average she uses her machine 7 hours and 11 minutes.  Her residual AHI is 1.7 on 6 to 18 cm of water with EPR 3.  Her leak in the 95th percentile is 13.3.  She states that the CPAP has worked well for her.  She states that if she does not use it all night she notices the difference the next day.  She denies any new issues.  She joins me today for follow-up.  HISTORY Mindy Collins is a 48 y.o. AA female patient is seen on 04-20-2018 in a referral from Dr Leafy Ro, but she had been referred in 3/ 2019 by Mindy Collins for the same indication, super-obesity with possible OSA/ obesity hypoventilation.   I have the pleasure of meeting with Mindy Collins today, and referral by Dr. Redgie Grayer.  She had seen her healthy weight and wellness doctor on 08 March 2018 with a chief concern of obesity.  She is in the category 3 weight loss plan, and her current BMI is 52.  She has trouble to install a regular exercise routine and she is also living with her mother which affects the choices of food available to her at the time for food preparation.  She is prediabetic and at risk for cardiovascular disease, she also feels fatigued she admits to daytime excessive sleepiness and she has known that she is snoring.  Her orthodontist even felt that she likely has sleep apnea.    She is a started on metformin Glucophage 500 mg was referred for sleep study and is taking metformin with breakfast daily.  Patient also takes Flexeril for muscle spasms which has a very sleepy making side effect, she is on Flonase for  allergic rhinitis, uses a Mirena IUD, Claritin as needed during allergy season, Mobic for orthopedic pain, she is on Benicar HCTZ, vitamin B12 supplements and vitamin D supplements.  Sleep relevant medical history:She has an extensive history of medical diagnoses including osteoarthritis, asthma, iron deficiency anemia, abdominal wall seroma, vitamin B12 deficiency, lower back pain, noncardiac chest pain, colitis, dyspnea, [place] nonalcoholic fatty liver, and gastroesophageal reflux disease, hypertension, keratoconus of the eye, palpitations.   REVIEW OF SYSTEMS: Out of a complete 14 system review of symptoms, the patient complains only of the following symptoms, and all other reviewed systems are negative.  See HPI  ALLERGIES: Allergies  Allergen Reactions  . Esomeprazole Magnesium Shortness Of Breath    Nervousness; tolerates Aciphex.  . Adhesive [Tape] Itching and Rash    Itching and rash with adhesive tape  . Latex Itching and Dermatitis    Skin blistering  . Levocetirizine Dihydrochloride Other (See Comments)    Muscle jerks  . Nexium [Esomeprazole]     HOME MEDICATIONS: Outpatient Medications Prior to Visit  Medication Sig Dispense Refill  . cyclobenzaprine (FLEXERIL) 10 MG tablet TAKE 1 TABLET BY MOUTH THREE TIMES A DAY IF NEEDED FOR MUSCLE SPASM 30 tablet 1  . fluticasone (FLONASE) 50 MCG/ACT nasal spray instill 1 spray into each nostril twice a day  0  .  levonorgestrel (MIRENA) 20 MCG/24HR IUD 1 each by Intrauterine route once.    . loratadine (CLARITIN) 10 MG tablet Take 10 mg by mouth daily.    . meloxicam (MOBIC) 15 MG tablet take 1 tablet by mouth daily if needed  0  . metFORMIN (GLUCOPHAGE) 500 MG tablet Take 1 tablet (500 mg total) by mouth daily with breakfast. 30 tablet 0  . olmesartan-hydrochlorothiazide (BENICAR HCT) 20-12.5 MG tablet Take 1 tablet by mouth daily. 90 tablet 1  . vitamin B-12 (CYANOCOBALAMIN) 1000 MCG tablet Take 3,000 mcg by mouth daily.     .  Vitamin D, Ergocalciferol, (DRISDOL) 1.25 MG (50000 UT) CAPS capsule Take 1 capsule (50,000 Units total) by mouth every 7 (seven) days. 4 capsule 0   No facility-administered medications prior to visit.     PAST MEDICAL HISTORY: Past Medical History:  Diagnosis Date  . Abdominal wall seroma   . Anemia   . Arthritis   . Asthma   . B12 deficiency   . Back pain   . Carpal tunnel syndrome, bilateral   . Chest pain   . Colitis   . Constipation   . Dysmenorrhea   . Dyspnea   . Fatty liver   . GERD (gastroesophageal reflux disease)   . HLD (hyperlipidemia)   . HPV (human papilloma virus) infection    WARTS/ TCA [place]  . Hypertension   . Keratoconus   . Leg edema   . Obesity   . Obesity   . Osteoarthritis   . Palpitations   . Prediabetes   . Seasonal allergies     PAST SURGICAL HISTORY: Past Surgical History:  Procedure Laterality Date  . GYNECOLOGIC CRYOSURGERY  1993   DYSPLASIA CERVIX/   . MYOMECTOMY  02/17/2011   Procedure: MYOMECTOMY;  Surgeon: Terrance Mass, MD;  Location: [facility];  Service: Gynecology;  Laterality: N/A;  I am hoping to get a 7:30am time on Dec 4, Dec 11 or Dec 12. It not available I will check on some 1:00 pm times. Thanks  . MYOMECTOMY ABDOMINAL APPROACH  04/31/2007  . OVARIAN CYST REMOVAL  02/17/2011   Procedure: OVARIAN CYSTECTOMY;  Surgeon: Terrance Mass, MD;  Location: [facility];  Service: Gynecology;  Laterality: Left;    FAMILY HISTORY: Family History  Problem Relation Age of Onset  . Hypertension Mother   . Breast cancer Mother   . Cancer Mother   . Hyperlipidemia Mother   . Thyroid disease Mother   . Obesity Mother   . Heart disease Maternal Grandmother   . Cancer Father        LUNG  . Breast cancer Maternal Aunt   . Diabetes Maternal Grandfather   . Cancer Paternal Aunt   . Ovarian cancer Paternal Aunt     SOCIAL HISTORY: Social History   Socioeconomic History  . Marital status: Single    Spouse name: Not on file  . Number of  children: 0  . Years of education: Not on file  . Highest education level: Not on file  Occupational History  . Occupation: Sport and exercise psychologist, Environmental consultant college, admin    Employer: [employer]    Comment: [place] [place]  . Financial resource strain: Not on file  . Food insecurity    Worry: Not on file    Inability: Not on file  . Transportation needs    Medical: Not on file    Non-medical: Not on file  Tobacco Use  . Smoking status: Never Smoker  .  Smokeless tobacco: Never Used  . Tobacco comment: denies tobacco   Substance and Sexual Activity  . Alcohol use: No    Alcohol/week: 0.0 standard drinks  . Drug use: No  . Sexual activity: Yes    Birth control/protection: I.U.D.  Lifestyle  . Physical activity    Days per week: Not on file    Minutes per session: Not on file  . Stress: Not on file  Relationships  . Social Herbalist on phone: Not on file    Gets together: Not on file    Attends religious service: Not on file    Active member of club or organization: Not on file    Attends meetings of clubs or organizations: Not on file    Relationship status: Not on file  . Intimate partner violence    Fear of current or ex partner: Not on file    Emotionally abused: Not on file    Physically abused: Not on file    Forced sexual activity: Not on file  Other Topics Concern  . Not on file  Social History Narrative   Graduate degree from [employer] in woman/gender studies.   Lives with her mother.      PHYSICAL EXAM  Vitals:   08/11/18 0827  BP: 122/72  Pulse: 86  Temp: (!) 97.3 F (36.3 C)  Weight: 290 lb (131.5 kg)  Height: 5\' 3"  (1.6 m)   Body mass index is 51.37 kg/m.  Generalized: Well developed, in no acute distress   Neurological examination  Mentation: Alert oriented to time, place, history taking. Follows all commands speech and language fluent Cranial nerve II-XII: Pupils were equal round reactive to light. Extraocular movements were  full, visual field were full on confrontational test. Facial sensation and strength were normal. Uvula tongue midline. Head turning and shoulder shrug  were normal and symmetric. Motor: The motor testing reveals 5 over 5 strength of all 4 extremities. Good symmetric motor tone is noted throughout.  Sensory: Sensory testing is intact to soft touch on all 4 extremities. No evidence of extinction is noted.  Coordination: Cerebellar testing reveals good finger-nose-finger and heel-to-shin bilaterally.  Gait and station: Gait is normal.     DIAGNOSTIC DATA (LABS, IMAGING, TESTING) - I reviewed patient records, labs, notes, testing and imaging myself where available.  Lab Results  Component Value Date   WBC 12.0 (H) 12/21/2017   HGB 13.7 12/21/2017   HCT 42.0 12/21/2017   MCV 81 12/21/2017   PLT 417 (H) 03/05/2017      Component Value Date/Time   NA 146 (H) 12/21/2017 0936   K 4.4 12/21/2017 0936   CL 102 12/21/2017 0936   CO2 27 12/21/2017 0936   GLUCOSE 91 12/21/2017 0936   GLUCOSE 99 02/22/2016 1206   BUN 11 12/21/2017 0936   CREATININE 0.83 12/21/2017 0936   CREATININE 0.75 02/22/2016 1206   CALCIUM 9.7 12/21/2017 0936   PROT 7.1 12/21/2017 0936   ALBUMIN 4.2 12/21/2017 0936   AST 29 12/21/2017 0936   ALT 35 (H) 12/21/2017 0936   ALKPHOS 127 (H) 12/21/2017 0936   BILITOT 0.5 12/21/2017 0936   GFRNONAA 85 12/21/2017 0936   GFRAA 98 12/21/2017 0936   Lab Results  Component Value Date   CHOL 242 (H) 12/21/2017   HDL 48 12/21/2017   LDLCALC 169 (H) 12/21/2017   LDLDIRECT 141.6 04/16/2010   TRIG 124 12/21/2017   CHOLHDL 4.6 (H) 03/05/2017   Lab Results  Component Value Date   HGBA1C 5.9 (H) 12/21/2017   Lab Results  Component Value Date   VITAMINB12 989 09/07/2017   Lab Results  Component Value Date   TSH 3.190 09/07/2017      ASSESSMENT AND PLAN 48 y.o. year old female  has a past medical history of Abdominal wall seroma, Anemia, Arthritis, Asthma, B12  deficiency, Back pain, Carpal tunnel syndrome, bilateral, Chest pain, Colitis, Constipation, Dysmenorrhea, Dyspnea, Fatty liver, GERD (gastroesophageal reflux disease), HLD (hyperlipidemia), HPV (human papilloma virus) infection, Hypertension, Keratoconus, Leg edema, Obesity, Obesity, Osteoarthritis, Palpitations, Prediabetes, and Seasonal allergies. here with:  1.  Obstructive sleep apnea on CPAP  Patient CPAP download shows excellent compliance and good treatment of her apnea.  She is encouraged to continue using CPAP nightly and greater than 4 hours each night.  She is advised that if her symptoms worsen or she develops new symptoms she should let us know.  She will follow-up in 6 months or sooner if needed    I spent 15 minutes with the patient. 50% of this time was spent reviewing CPAP download   Ward Givens, MSN, NP-C 08/11/2018, 9:07 AM [facility] [address], [place], [place] [address] [phone]

## 2018-08-11 NOTE — Patient Instructions (Addendum)

## 2018-08-18 ENCOUNTER — Encounter (INDEPENDENT_AMBULATORY_CARE_PROVIDER_SITE_OTHER): Payer: Self-pay | Admitting: Family Medicine

## 2018-08-18 ENCOUNTER — Telehealth (INDEPENDENT_AMBULATORY_CARE_PROVIDER_SITE_OTHER): Payer: 59 | Admitting: Family Medicine

## 2018-08-18 ENCOUNTER — Other Ambulatory Visit: Payer: Self-pay

## 2018-08-18 DIAGNOSIS — R7303 Prediabetes: Secondary | ICD-10-CM | POA: Diagnosis not present

## 2018-08-18 DIAGNOSIS — E782 Mixed hyperlipidemia: Secondary | ICD-10-CM | POA: Diagnosis not present

## 2018-08-18 DIAGNOSIS — Z6841 Body Mass Index (BMI) 40.0 and over, adult: Secondary | ICD-10-CM
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
## 2018-08-27 ENCOUNTER — Ambulatory Visit: Payer: 59 | Admitting: Registered Nurse

## 2018-08-31 ENCOUNTER — Other Ambulatory Visit: Payer: Self-pay | Admitting: Obstetrics & Gynecology

## 2018-08-31 DIAGNOSIS — Z1231 Encounter for screening mammogram for malignant neoplasm of breast: Secondary | ICD-10-CM

## 2018-09-01 ENCOUNTER — Other Ambulatory Visit: Payer: Self-pay

## 2018-09-01 ENCOUNTER — Encounter (INDEPENDENT_AMBULATORY_CARE_PROVIDER_SITE_OTHER): Payer: Self-pay | Admitting: Physician Assistant

## 2018-09-01 ENCOUNTER — Ambulatory Visit (INDEPENDENT_AMBULATORY_CARE_PROVIDER_SITE_OTHER): Payer: 59 | Admitting: Physician Assistant

## 2018-09-01 VITALS — BP 122/80 | HR 85 | Temp 98.1°F | Ht 63.0 in | Wt 288.0 lb

## 2018-09-01 DIAGNOSIS — Z9189 Other specified personal risk factors, not elsewhere classified: Secondary | ICD-10-CM

## 2018-09-01 DIAGNOSIS — R7303 Prediabetes: Secondary | ICD-10-CM | POA: Diagnosis not present

## 2018-09-01 DIAGNOSIS — E559 Vitamin D deficiency, unspecified: Secondary | ICD-10-CM

## 2018-09-01 DIAGNOSIS — E7849 Other hyperlipidemia: Secondary | ICD-10-CM

## 2018-09-01 DIAGNOSIS — E66813 Obesity, class 3: Secondary | ICD-10-CM

## 2018-09-01 DIAGNOSIS — Z6841 Body Mass Index (BMI) 40.0 and over, adult: Secondary | ICD-10-CM

## 2018-09-01 MED ORDER — VITAMIN D (ERGOCALCIFEROL) 1.25 MG (50000 UNIT) PO CAPS: 50000.0000 [IU] | ORAL_CAPSULE | ORAL | 0 refills | Status: DC

## 2018-09-01 MED ORDER — METFORMIN HCL 500 MG PO TABS: 500.0000 mg | ORAL_TABLET | Freq: Every day | ORAL | 0 refills | Status: DC

## 2018-09-01 NOTE — Progress Notes (Signed)
Office: [phone]  /  Fax: [phone]   HPI:   Chief Complaint: OBESITY Mindy Collins is here to discuss her progress with her obesity treatment plan. She is on the Category 3 plan and is following her eating plan approximately 0% of the time. She states she is exercising 0 minutes 0 times per week. Mindy Collins reports that she has a crazy work schedule, is working from home all day. She is skipping meals and only eating dinner, when she eats out. Her weight is 288 lb (130.6 kg) today and has had a weight gain of 10 lbs since her last in-office visit 4 months ago. She has lost 0 lbs since starting treatment with [place].  Vitamin D deficiency Mindy Collins has a diagnosis of Vitamin D deficiency. She is currently taking prescription Vit D and denies nausea, vomiting or muscle weakness.  At risk for osteopenia and osteoporosis Mindy Collins is at higher risk of osteopenia and osteoporosis due to Vitamin D deficiency.   Pre-Diabetes Mindy Collins has a diagnosis of prediabetes based on her elevated Hgb A1c and was informed this puts her at greater risk of developing diabetes. She is taking metformin currently and continues to work on diet and exercise to decrease risk of diabetes. She denies nausea, vomiting, or diarrhea on metformin. No polyphagia.  Hyperlipidemia Mindy Collins has hyperlipidemia and has been trying to improve her cholesterol levels with intensive lifestyle modification including a low saturated fat diet, exercise and weight loss. She is on no medication.  ASSESSMENT AND PLAN:  Vitamin D deficiency - Plan: VITAMIN D 25 Hydroxy (Vit-D Deficiency, Fractures), Vitamin D, Ergocalciferol, (DRISDOL) 1.25 MG (50000 UT) CAPS capsule  Prediabetes - Plan: Comprehensive metabolic panel, Hemoglobin A1c, Insulin, random, metFORMIN (GLUCOPHAGE) 500 MG tablet  Other hyperlipidemia - Plan: Lipid Panel With LDL/HDL Ratio  At risk for osteoporosis  Class 3 severe obesity with serious comorbidity and body Collins index (BMI) of  50.0 to 59.9 in adult, unspecified obesity type (HCC)  PLAN:  Vitamin D Deficiency Mindy Collins was informed that low Vitamin D levels contributes to fatigue and are associated with obesity, breast, and colon cancer. She agrees to continue to take prescription Vit D @ 50,000 IU every week #4 with 0 refills and will follow-up for routine testing of Vitamin D, at least 2-3 times per year. She was informed of the risk of over-replacement of Vitamin D and agrees to not increase her dose unless she discusses this with [place] first. Mindy Collins agrees to follow-up with our clinic in 2 weeks.  At risk for osteopenia and osteoporosis Mindy Collins was given extended  (15 minutes) osteoporosis prevention counseling today. Mindy Collins is at risk for osteopenia and osteoporsis due to her Vitamin D deficiency. She was encouraged to take her Vitamin D and follow her higher calcium diet and increase strengthening exercise to help strengthen her bones and decrease her risk of osteopenia and osteoporosis.  Pre-Diabetes Mindy Collins will continue to work on weight loss, exercise, and decreasing simple carbohydrates in her diet to help decrease the risk of diabetes. We dicussed metformin including benefits and risks. She was informed that eating too many simple carbohydrates or too many calories at one sitting increases the likelihood of GI side effects. Mindy Collins was given a refill on her metformin #30 with 0 refills. She agrees to follow-up with our clinic in 2 weeks.  Hyperlipidemia Mindy Collins was informed of the American Heart Association Guidelines emphasizing intensive lifestyle modifications as the first line treatment for hyperlipidemia. We discussed many lifestyle modifications today in depth,  and Mindy Collins will continue to work on decreasing saturated fats such as fatty red meat, butter and many fried foods. She will have labs checked, increase vegetables and lean protein in her diet, and continue to work on exercise and weight loss efforts.  Obesity Mindy Collins  is currently in the action stage of change. As such, her goal is to continue with weight loss efforts. She has agreed to follow the Category 3 plan. Mindy Collins has been instructed to work up to a goal of 150 minutes of combined cardio and strengthening exercise per week for weight loss and overall health benefits. We discussed the following Behavioral Modification Strategies today: decrease eating out, work on meal planning and easy cooking plans, and keeping healthy foods in the home.  Mindy Collins has agreed to follow-up with our clinic in 2 weeks. She was informed of the importance of frequent follow-up visits to maximize her success with intensive lifestyle modifications for her multiple health conditions.  Mindy Collins: Mindy Collins  Allergen Reactions   Esomeprazole Magnesium Shortness Of Breath    Nervousness; tolerates Aciphex.   Adhesive [Tape] Itching and Rash    Itching and rash with adhesive tape   Latex Itching and Dermatitis    Skin blistering   Levocetirizine Dihydrochloride Other (See Comments)    Muscle jerks   Nexium [Esomeprazole]     MEDICATIONS: Current Outpatient Medications on File Prior to Visit  Medication Sig Dispense Refill   cyclobenzaprine (FLEXERIL) 10 MG tablet TAKE 1 TABLET BY MOUTH THREE TIMES A DAY IF NEEDED FOR MUSCLE SPASM 30 tablet 1   fluticasone (FLONASE) 50 MCG/ACT nasal spray instill 1 spray into each nostril twice a day  0   levonorgestrel (MIRENA) 20 MCG/24HR IUD 1 each by Intrauterine route once.     loratadine (CLARITIN) 10 MG tablet Take 10 mg by mouth daily.     meloxicam (MOBIC) 15 MG tablet take 1 tablet by mouth daily if needed  0   olmesartan-hydrochlorothiazide (BENICAR HCT) 20-12.5 MG tablet Take 1 tablet by mouth daily. 90 tablet 1   vitamin B-12 (CYANOCOBALAMIN) 1000 MCG tablet Take 3,000 mcg by mouth daily.      No current facility-administered medications on file prior to visit.     PAST MEDICAL HISTORY: Past Medical History:    Diagnosis Date   Abdominal wall seroma    Anemia    Arthritis    Asthma    B12 deficiency    Back pain    Carpal tunnel syndrome, bilateral    Chest pain    Colitis    Constipation    Dysmenorrhea    Dyspnea    Fatty liver    GERD (gastroesophageal reflux disease)    HLD (hyperlipidemia)    HPV (human papilloma virus) infection    WARTS/ TCA TX   Hypertension    Keratoconus    Leg edema    Obesity    Obesity    Osteoarthritis    Palpitations    Prediabetes    Seasonal Mindy Collins     PAST SURGICAL HISTORY: Past Surgical History:  Procedure Laterality Date   GYNECOLOGIC CRYOSURGERY  1993   DYSPLASIA CERVIX/    MYOMECTOMY  02/17/2011   Procedure: MYOMECTOMY;  Surgeon: Mindy Mass, MD;  Location: [facility];  Service: Gynecology;  Laterality: N/A;  I am hoping to get a 7:30am time on Dec 4, Dec 11 or Dec 12. It not available I will check on some 1:00 pm times. Thanks   MYOMECTOMY ABDOMINAL  APPROACH  04/31/2007   OVARIAN CYST REMOVAL  02/17/2011   Procedure: OVARIAN CYSTECTOMY;  Surgeon: Mindy Mass, MD;  Location: [facility];  Service: Gynecology;  Laterality: Left;    SOCIAL HISTORY: Social History   Tobacco Use   Smoking status: Never Smoker   Smokeless tobacco: Never Used   Tobacco comment: denies tobacco   Substance Use Topics   Alcohol use: No    Alcohol/week: 0.0 standard drinks   Drug use: No    FAMILY HISTORY: Family History  Problem Relation Age of Onset   Hypertension Mother    Breast cancer Mother    Cancer Mother    Hyperlipidemia Mother    Thyroid disease Mother    Obesity Mother    Heart disease Maternal Grandmother    Cancer Father        LUNG   Breast cancer Maternal Aunt    Diabetes Maternal Grandfather    Cancer Paternal Aunt    Ovarian cancer Paternal Aunt    ROS: Review of Systems  Gastrointestinal: Negative for diarrhea, nausea and vomiting.  Musculoskeletal:       Negative for  muscle weakness.  Endo/Heme/Mindy Collins:       Negative for polyphagia.   PHYSICAL EXAM: Blood pressure 122/80, pulse 85, temperature 98.1 F (36.7 C), temperature source Oral, height 5\' 3"  (1.6 m), weight 288 lb (130.6 kg), SpO2 95 %. Body Collins index is 51.02 kg/m. Physical Exam Vitals signs reviewed.  Constitutional:      Appearance: Normal appearance. She is obese.  Cardiovascular:     Rate and Rhythm: Normal rate.     Pulses: Normal pulses.  Pulmonary:     Effort: Pulmonary effort is normal.     Breath sounds: Normal breath sounds.  Musculoskeletal: Normal range of motion.  Skin:    General: Skin is warm and dry.  Neurological:     Mental Status: She is alert and oriented to person, place, and time.  Psychiatric:        Behavior: Behavior normal.   RECENT LABS AND TESTS: BMET    Component Value Date/Time   NA 146 (H) 12/21/2017 0936   K 4.4 12/21/2017 0936   CL 102 12/21/2017 0936   CO2 27 12/21/2017 0936   GLUCOSE 91 12/21/2017 0936   GLUCOSE 99 02/22/2016 1206   BUN 11 12/21/2017 0936   CREATININE 0.83 12/21/2017 0936   CREATININE 0.75 02/22/2016 1206   CALCIUM 9.7 12/21/2017 0936   GFRNONAA 85 12/21/2017 0936   GFRAA 98 12/21/2017 0936   Lab Results  Component Value Date   HGBA1C 5.9 (H) 12/21/2017   HGBA1C 6.0 (H) 09/07/2017   HGBA1C 5.9 (H) 03/05/2017   HGBA1C 6.0 (H) 09/27/2016   HGBA1C 5.7 (H) 01/21/2014   Lab Results  Component Value Date   INSULIN 32.3 (H) 12/21/2017   INSULIN 14.8 09/07/2017   CBC    Component Value Date/Time   WBC 12.0 (H) 12/21/2017 0936   WBC 9.3 02/22/2016 1206   RBC 5.17 12/21/2017 0936   RBC 4.84 02/22/2016 1206   HGB 13.7 12/21/2017 0936   HGB 11.5 (L) 02/14/2011 1218   HCT 42.0 12/21/2017 0936   HCT 35.4 02/14/2011 1218   PLT 417 (H) 03/05/2017 1754   MCV 81 12/21/2017 0936   MCV 83.2 02/14/2011 1218   MCH 26.5 (L) 12/21/2017 0936   MCH 26.4 (L) 02/22/2016 1206   MCHC 32.6 12/21/2017 0936   MCHC 31.7 (L)  02/22/2016 1206  RDW 13.4 12/21/2017 0936   RDW 15.2 (H) 02/14/2011 1218   LYMPHSABS 2.2 12/21/2017 0936   LYMPHSABS 1.7 02/14/2011 1218   MONOABS 651 02/22/2016 1206   MONOABS 0.8 02/14/2011 1218   EOSABS 0.1 12/21/2017 0936   BASOSABS 0.1 12/21/2017 0936   BASOSABS 0.1 02/14/2011 1218   Iron/TIBC/Ferritin/ %Sat No results found for: IRON, TIBC, FERRITIN, IRONPCTSAT Lipid Panel     Component Value Date/Time   CHOL 242 (H) 12/21/2017 0936   TRIG 124 12/21/2017 0936   HDL 48 12/21/2017 0936   CHOLHDL 4.6 (H) 03/05/2017 1754   CHOLHDL 4.8 02/22/2016 1206   VLDL 21 02/22/2016 1206   LDLCALC 169 (H) 12/21/2017 0936   LDLDIRECT 141.6 04/16/2010 0743   Hepatic Function Panel     Component Value Date/Time   PROT 7.1 12/21/2017 0936   ALBUMIN 4.2 12/21/2017 0936   AST 29 12/21/2017 0936   ALT 35 (H) 12/21/2017 0936   ALKPHOS 127 (H) 12/21/2017 0936   BILITOT 0.5 12/21/2017 0936      Component Value Date/Time   TSH 3.190 09/07/2017 0917   TSH 2.190 03/05/2017 1754   TSH 1.72 02/22/2016 1206   Results for CASAUNDRA, TAKACS (MRN [id number]) as of 09/01/2018 16:02  Ref. Range 12/21/2017 09:36  Vitamin D, 25-Hydroxy Latest Ref Range: 30.0 - 100.0 ng/mL 13.7 (L)   OBESITY BEHAVIORAL INTERVENTION VISIT  Today's visit was #18  Starting weight: 280 lbs Starting date: 09/07/2017 Today's weight: 288 lbs  Today's date: 09/01/2018 Total lbs lost to date: 0   09/01/2018  Height 5\' 3"  (1.6 m)  Weight 288 lb (130.6 kg)  BMI (Calculated) 51.03  BLOOD PRESSURE - SYSTOLIC 088  BLOOD PRESSURE - DIASTOLIC 80   Body Fat % 11.0 %   ASK: We discussed the diagnosis of obesity with Fernande Bras today and Laria agreed to give [place] permission to discuss obesity behavioral modification therapy today.  ASSESS: Autum has the diagnosis of obesity and her BMI today is 52.7. Quantia is in the action stage of change.   ADVISE: Cashae was educated on the multiple health risks of obesity as well as the  benefit of weight loss to improve her health. She was advised of the need for long term treatment and the importance of lifestyle modifications to improve her current health and to decrease her risk of future health problems.  AGREE: Multiple dietary modification options and treatment options were discussed and  Jasper agreed to follow the recommendations documented in the above note.  ARRANGE: Shanetra was educated on the importance of frequent visits to treat obesity as outlined per CMS and USPSTF guidelines and agreed to schedule her next follow up appointment today.  Migdalia Dk, am acting as transcriptionist for Abby Potash, PA-C I, Abby Potash, PA-C have reviewed above note and agree with its content

## 2018-09-02 LAB — HEMOGLOBIN A1C
Est. average glucose Bld gHb Est-mCnc: 123 mg/dL
Hgb A1c MFr Bld: 5.9 % — ABNORMAL HIGH (ref 4.8–5.6)

## 2018-09-02 LAB — LIPID PANEL WITH LDL/HDL RATIO
Cholesterol, Total: 243 mg/dL — ABNORMAL HIGH (ref 100–199)
HDL: 52 mg/dL (ref >39)
LDL Calculated: 154 mg/dL — ABNORMAL HIGH (ref 0–99)
LDl/HDL Ratio: 3 ratio (ref 0.0–3.2)
Triglycerides: 186 mg/dL — ABNORMAL HIGH (ref 0–149)
VLDL Cholesterol Cal: 37 mg/dL (ref 5–40)

## 2018-09-02 LAB — COMPREHENSIVE METABOLIC PANEL
ALT: 63 IU/L — ABNORMAL HIGH (ref 0–32)
AST: 40 IU/L (ref 0–40)
Albumin/Globulin Ratio: 1.6 (ref 1.2–2.2)
Albumin: 4.2 g/dL (ref 3.8–4.8)
Alkaline Phosphatase: 122 IU/L — ABNORMAL HIGH (ref 39–117)
BUN/Creatinine Ratio: 18 (ref 9–23)
BUN: 12 mg/dL (ref 6–24)
Bilirubin Total: 0.4 mg/dL (ref 0.0–1.2)
CO2: 26 mmol/L (ref 20–29)
Calcium: 9.6 mg/dL (ref 8.7–10.2)
Chloride: 102 mmol/L (ref 96–106)
Creatinine, Ser: 0.65 mg/dL (ref 0.57–1.00)
GFR calc Af Amer: 123 mL/min/{1.73_m2} (ref >59)
GFR calc non Af Amer: 107 mL/min/{1.73_m2} (ref >59)
Globulin, Total: 2.7 g/dL (ref 1.5–4.5)
Glucose: 103 mg/dL — ABNORMAL HIGH (ref 65–99)
Potassium: 3.8 mmol/L (ref 3.5–5.2)
Sodium: 144 mmol/L (ref 134–144)
Total Protein: 6.9 g/dL (ref 6.0–8.5)

## 2018-09-02 LAB — INSULIN, RANDOM: INSULIN: 19.3 u[IU]/mL (ref 2.6–24.9)

## 2018-09-02 LAB — VITAMIN D 25 HYDROXY (VIT D DEFICIENCY, FRACTURES): Vit D, 25-Hydroxy: 14.2 ng/mL — ABNORMAL LOW (ref 30.0–100.0)

## 2018-09-15 ENCOUNTER — Encounter: Payer: 59 | Admitting: Obstetrics & Gynecology

## 2018-09-21 ENCOUNTER — Other Ambulatory Visit: Payer: Self-pay

## 2018-09-21 ENCOUNTER — Ambulatory Visit (INDEPENDENT_AMBULATORY_CARE_PROVIDER_SITE_OTHER): Payer: 59 | Admitting: Physician Assistant

## 2018-09-21 ENCOUNTER — Encounter (INDEPENDENT_AMBULATORY_CARE_PROVIDER_SITE_OTHER): Payer: Self-pay | Admitting: Physician Assistant

## 2018-09-21 VITALS — BP 137/87 | HR 97 | Temp 98.4°F | Ht 63.0 in | Wt 288.0 lb

## 2018-09-21 DIAGNOSIS — R7303 Prediabetes: Secondary | ICD-10-CM | POA: Diagnosis not present

## 2018-09-21 DIAGNOSIS — E559 Vitamin D deficiency, unspecified: Secondary | ICD-10-CM | POA: Diagnosis not present

## 2018-09-21 DIAGNOSIS — I10 Essential (primary) hypertension: Secondary | ICD-10-CM | POA: Diagnosis not present

## 2018-09-21 DIAGNOSIS — Z9189 Other specified personal risk factors, not elsewhere classified: Secondary | ICD-10-CM

## 2018-09-21 DIAGNOSIS — Z6841 Body Mass Index (BMI) 40.0 and over, adult: Secondary | ICD-10-CM

## 2018-09-21 MED ORDER — OLMESARTAN MEDOXOMIL-HCTZ 20-12.5 MG PO TABS: 1.0000 | ORAL_TABLET | Freq: Every day | ORAL | 0 refills | Status: DC

## 2018-09-21 MED ORDER — VITAMIN D (ERGOCALCIFEROL) 1.25 MG (50000 UNIT) PO CAPS: 50000.0000 [IU] | ORAL_CAPSULE | ORAL | 0 refills | Status: DC

## 2018-09-22 NOTE — Progress Notes (Signed)
Office: [phone]  /  Fax: [phone]   HPI:   Chief Complaint: OBESITY Mindy Collins is here to discuss her progress with her obesity treatment plan. She is on the Category 3 plan and is following her eating plan approximately 0% of the time. She states she is exercising 0 minutes 0 times per week. Mindy Collins reports that she is doing a better job getting more food in during the day. She continues to not eat enough protein. Her weight is 288 lb (130.6 kg) today and has not lost weight since her last visit. She has lost 0 lbs since starting treatment with [place].  Vitamin D deficiency Mindy Collins has a diagnosis of Vitamin D deficiency. She is currently taking prescription Vit D and denies nausea, vomiting or muscle weakness.  At risk for osteopenia and osteoporosis Mindy Collins is at higher risk of osteopenia and osteoporosis due to Vitamin D deficiency.   Pre-Diabetes Mindy Collins has a diagnosis of prediabetes based on her elevated Hgb A1c and was informed this puts her at greater risk of developing diabetes. She is taking metformin currently and continues to work on diet and exercise to decrease risk of diabetes. She denies nausea, vomiting, or diarrhea. No polyphagia.   Hypertension Mindy Collins is a 48 y.o. female with hypertension and is on Benicar HCT.  Mindy Collins denies chest pain or shortness of breath on exertion. She is working weight loss to help control her blood pressure with the goal of decreasing her risk of heart attack and stroke.  ASSESSMENT AND PLAN:  Vitamin D deficiency - Plan: Vitamin D, Ergocalciferol, (DRISDOL) 1.25 MG (50000 UT) CAPS capsule  Prediabetes  Essential hypertension - Plan: olmesartan-hydrochlorothiazide (BENICAR HCT) 20-12.5 MG tablet  At risk for osteoporosis  Class 3 severe obesity with serious comorbidity and body mass index (BMI) of 50.0 to 59.9 in adult, unspecified obesity type ([place])  PLAN:  Vitamin D Deficiency Mindy Collins was informed that low Vitamin D levels  contributes to fatigue and are associated with obesity, breast, and colon cancer. She agrees to continue to take prescription Vit D @ 50,000 IU every week #10 with 0 refills and will follow-up for routine testing of Vitamin D, at least 2-3 times per year. She was informed of the risk of over-replacement of Vitamin D and agrees to not increase her dose unless she discusses this with [place] first. Mindy Collins agrees to follow-up with our clinic in 2 weeks.  At risk for osteopenia and osteoporosis Mindy Collins was given extended  (15 minutes) osteoporosis prevention counseling today. Mindy Collins is at risk for osteopenia and osteoporosis due to her Vitamin D deficiency. She was encouraged to take her Vitamin D and follow her higher calcium diet and increase strengthening exercise to help strengthen her bones and decrease her risk of osteopenia and osteoporosis.  Pre-Diabetes Mindy Collins will continue to work on weight loss, exercise, and decreasing simple carbohydrates in her diet to help decrease the risk of diabetes. We dicussed metformin including benefits and risks. She was informed that eating too many simple carbohydrates or too many calories at one sitting increases the likelihood of GI side effects. Mindy Collins will continue medications and weight loss. She will follow-up with [place] as directed to monitor her progress.  Hypertension We discussed sodium restriction, working on healthy weight loss, and a regular exercise program as the means to achieve improved blood pressure control. Mindy Collins agreed with this plan and agreed to follow up as directed. We will continue to monitor her blood pressure as well as her  progress with the above lifestyle modifications. Mindy Collins was given a refill on her Benicar HCT #90 with 0 refills and agrees to follow-up with our clinic in 2 weeks. She will watch for signs of hypotension as she continues her lifestyle modifications.  Obesity Mindy Collins is currently in the action stage of change. As such, her goal is to  continue with weight loss efforts. She has agreed to follow the Category 3 plan. Mindy Collins has been instructed to work up to a goal of 150 minutes of combined cardio and strengthening exercise per week for weight loss and overall health benefits. We discussed the following Behavioral Modification Strategies today: work on meal planning and easy cooking plans, and keeping healthy foods in the home.  Mindy Collins has agreed to follow-up with our clinic in 2 weeks. She was informed of the importance of frequent follow-up visits to maximize her success with intensive lifestyle modifications for her multiple health conditions.  ALLERGIES: Allergies  Allergen Reactions   Esomeprazole Magnesium Shortness Of Breath    Nervousness; tolerates Aciphex.   Adhesive [Tape] Itching and Rash    Itching and rash with adhesive tape   Latex Itching and Dermatitis    Skin blistering   Levocetirizine Dihydrochloride Other (See Comments)    Muscle jerks   Nexium [Esomeprazole]     MEDICATIONS: Current Outpatient Medications on File Prior to Visit  Medication Sig Dispense Refill   cyclobenzaprine (FLEXERIL) 10 MG tablet TAKE 1 TABLET BY MOUTH THREE TIMES A DAY IF NEEDED FOR MUSCLE SPASM 30 tablet 1   fluticasone (FLONASE) 50 MCG/ACT nasal spray instill 1 spray into each nostril twice a day  0   levonorgestrel (MIRENA) 20 MCG/24HR IUD 1 each by Intrauterine route once.     loratadine (CLARITIN) 10 MG tablet Take 10 mg by mouth daily.     meloxicam (MOBIC) 15 MG tablet take 1 tablet by mouth daily if needed  0   metFORMIN (GLUCOPHAGE) 500 MG tablet Take 1 tablet (500 mg total) by mouth daily with breakfast. 30 tablet 0   vitamin B-12 (CYANOCOBALAMIN) 1000 MCG tablet Take 3,000 mcg by mouth daily.      No current facility-administered medications on file prior to visit.     PAST MEDICAL HISTORY: Past Medical History:  Diagnosis Date   Abdominal wall seroma    Anemia    Arthritis    Asthma     B12 deficiency    Back pain    Carpal tunnel syndrome, bilateral    Chest pain    Colitis    Constipation    Dysmenorrhea    Dyspnea    Fatty liver    GERD (gastroesophageal reflux disease)    HLD (hyperlipidemia)    HPV (human papilloma virus) infection    WARTS/ TCA TX   Hypertension    Keratoconus    Leg edema    Obesity    Obesity    Osteoarthritis    Palpitations    Prediabetes    Seasonal allergies     PAST SURGICAL HISTORY: Past Surgical History:  Procedure Laterality Date   GYNECOLOGIC CRYOSURGERY  1993   DYSPLASIA CERVIX/    MYOMECTOMY  02/17/2011   Procedure: MYOMECTOMY;  Surgeon: Terrance Mass, MD;  Location: [facility];  Service: Gynecology;  Laterality: N/A;  I am hoping to get a 7:30am time on Dec 4, Dec 11 or Dec 12. It not available I will check on some 1:00 pm times. Thanks   MYOMECTOMY ABDOMINAL APPROACH  04/31/2007   OVARIAN CYST REMOVAL  02/17/2011   Procedure: OVARIAN CYSTECTOMY;  Surgeon: Terrance Mass, MD;  Location: [facility];  Service: Gynecology;  Laterality: Left;    SOCIAL HISTORY: Social History   Tobacco Use   Smoking status: Never Smoker   Smokeless tobacco: Never Used   Tobacco comment: denies tobacco   Substance Use Topics   Alcohol use: No    Alcohol/week: 0.0 standard drinks   Drug use: No    FAMILY HISTORY: Family History  Problem Relation Age of Onset   Hypertension Mother    Breast cancer Mother    Cancer Mother    Hyperlipidemia Mother    Thyroid disease Mother    Obesity Mother    Heart disease Maternal Grandmother    Cancer Father        LUNG   Breast cancer Maternal Aunt    Diabetes Maternal Grandfather    Cancer Paternal Aunt    Ovarian cancer Paternal Aunt    ROS: Review of Systems  Gastrointestinal: Negative for diarrhea, nausea and vomiting.  Musculoskeletal:       Negative for muscle weakness.  Endo/Heme/Allergies:       Negative for polyphagia.   PHYSICAL  EXAM: Blood pressure 137/87, pulse 97, temperature 98.4 F (36.9 C), temperature source Oral, height 5\' 3"  (1.6 m), weight 288 lb (130.6 kg), SpO2 97 %. Body mass index is 51.02 kg/m. Physical Exam Vitals signs reviewed.  Constitutional:      Appearance: Normal appearance. She is obese.  Cardiovascular:     Rate and Rhythm: Normal rate.     Pulses: Normal pulses.  Pulmonary:     Effort: Pulmonary effort is normal.     Breath sounds: Normal breath sounds.  Musculoskeletal: Normal range of motion.  Skin:    General: Skin is warm and dry.  Neurological:     Mental Status: She is alert and oriented to person, place, and time.  Psychiatric:        Behavior: Behavior normal.   RECENT LABS AND TESTS: BMET    Component Value Date/Time   NA 144 09/01/2018 0935   K 3.8 09/01/2018 0935   CL 102 09/01/2018 0935   CO2 26 09/01/2018 0935   GLUCOSE 103 (H) 09/01/2018 0935   GLUCOSE 99 02/22/2016 1206   BUN 12 09/01/2018 0935   CREATININE 0.65 09/01/2018 0935   CREATININE 0.75 02/22/2016 1206   CALCIUM 9.6 09/01/2018 0935   GFRNONAA 107 09/01/2018 0935   GFRAA 123 09/01/2018 0935   Lab Results  Component Value Date   HGBA1C 5.9 (H) 09/01/2018   HGBA1C 5.9 (H) 12/21/2017   HGBA1C 6.0 (H) 09/07/2017   HGBA1C 5.9 (H) 03/05/2017   HGBA1C 6.0 (H) 09/27/2016   Lab Results  Component Value Date   INSULIN 19.3 09/01/2018   INSULIN 32.3 (H) 12/21/2017   INSULIN 14.8 09/07/2017   CBC    Component Value Date/Time   WBC 12.0 (H) 12/21/2017 0936   WBC 9.3 02/22/2016 1206   RBC 5.17 12/21/2017 0936   RBC 4.84 02/22/2016 1206   HGB 13.7 12/21/2017 0936   HGB 11.5 (L) 02/14/2011 1218   HCT 42.0 12/21/2017 0936   HCT 35.4 02/14/2011 1218   PLT 417 (H) 03/05/2017 1754   MCV 81 12/21/2017 0936   MCV 83.2 02/14/2011 1218   MCH 26.5 (L) 12/21/2017 0936   MCH 26.4 (L) 02/22/2016 1206   MCHC 32.6 12/21/2017 0936   MCHC 31.7 (L) 02/22/2016 1206  RDW 13.4 12/21/2017 0936   RDW 15.2  (H) 02/14/2011 1218   LYMPHSABS 2.2 12/21/2017 0936   LYMPHSABS 1.7 02/14/2011 1218   MONOABS 651 02/22/2016 1206   MONOABS 0.8 02/14/2011 1218   EOSABS 0.1 12/21/2017 0936   BASOSABS 0.1 12/21/2017 0936   BASOSABS 0.1 02/14/2011 1218   Iron/TIBC/Ferritin/ %Sat No results found for: IRON, TIBC, FERRITIN, IRONPCTSAT Lipid Panel     Component Value Date/Time   CHOL 243 (H) 09/01/2018 0935   TRIG 186 (H) 09/01/2018 0935   HDL 52 09/01/2018 0935   CHOLHDL 4.6 (H) 03/05/2017 1754   CHOLHDL 4.8 02/22/2016 1206   VLDL 21 02/22/2016 1206   LDLCALC 154 (H) 09/01/2018 0935   LDLDIRECT 141.6 04/16/2010 0743   Hepatic Function Panel     Component Value Date/Time   PROT 6.9 09/01/2018 0935   ALBUMIN 4.2 09/01/2018 0935   AST 40 09/01/2018 0935   ALT 63 (H) 09/01/2018 0935   ALKPHOS 122 (H) 09/01/2018 0935   BILITOT 0.4 09/01/2018 0935      Component Value Date/Time   TSH 3.190 09/07/2017 0917   TSH 2.190 03/05/2017 1754   TSH 1.72 02/22/2016 1206   Results for LILEY, RAKE (MRN [id number]) as of 09/22/2018 14:33  Ref. Range 09/01/2018 09:35  Vitamin D, 25-Hydroxy Latest Ref Range: 30.0 - 100.0 ng/mL 14.2 (L)   OBESITY BEHAVIORAL INTERVENTION VISIT  Today's visit was #19  Starting weight: 280 lbs Starting date: 09/07/2017 Today's weight: 288 lbs  Today's date: 09/21/2018 Total lbs lost to date: 0    09/21/2018  Height 5\' 3"  (1.6 m)  Weight 288 lb (130.6 kg)  BMI (Calculated) 51.03  BLOOD PRESSURE - SYSTOLIC 748  BLOOD PRESSURE - DIASTOLIC 87   Body Fat % 27.0 %   ASK: We discussed the diagnosis of obesity with Fernande Bras today and Huyen agreed to give [place] permission to discuss obesity behavioral modification therapy today.  ASSESS: Johnny has the diagnosis of obesity and her BMI today is 51.2. Gali is in the action stage of change.   ADVISE: Alajah was educated on the multiple health risks of obesity as well as the benefit of weight loss to improve her health. She was  advised of the need for long term treatment and the importance of lifestyle modifications to improve her current health and to decrease her risk of future health problems.  AGREE: Multiple dietary modification options and treatment options were discussed and  Jahlisa agreed to follow the recommendations documented in the above note.  ARRANGE: Klaryssa was educated on the importance of frequent visits to treat obesity as outlined per CMS and USPSTF guidelines and agreed to schedule her next follow up appointment today.  Migdalia Dk, am acting as transcriptionist for Abby Potash, PA-C I, Abby Potash, PA-C have reviewed above note and agree with its content

## 2018-10-06 ENCOUNTER — Encounter (INDEPENDENT_AMBULATORY_CARE_PROVIDER_SITE_OTHER): Payer: Self-pay | Admitting: Physician Assistant

## 2018-10-06 ENCOUNTER — Other Ambulatory Visit (INDEPENDENT_AMBULATORY_CARE_PROVIDER_SITE_OTHER): Payer: Self-pay | Admitting: Physician Assistant

## 2018-10-06 ENCOUNTER — Ambulatory Visit (INDEPENDENT_AMBULATORY_CARE_PROVIDER_SITE_OTHER): Payer: 59 | Admitting: Physician Assistant

## 2018-10-06 DIAGNOSIS — E559 Vitamin D deficiency, unspecified: Secondary | ICD-10-CM

## 2018-10-06 NOTE — Telephone Encounter (Signed)
Last note says weekly.  You want this changed to twice weekly?

## 2018-10-07 MED ORDER — VITAMIN D (ERGOCALCIFEROL) 1.25 MG (50000 UNIT) PO CAPS: 50000.0000 [IU] | ORAL_CAPSULE | ORAL | 0 refills | Status: DC

## 2018-10-07 NOTE — Telephone Encounter (Signed)
It should be twice weekly. Thanks

## 2018-10-14 ENCOUNTER — Ambulatory Visit: Payer: Self-pay

## 2018-11-01 ENCOUNTER — Encounter: Payer: Self-pay | Admitting: Registered Nurse

## 2018-11-01 ENCOUNTER — Ambulatory Visit: Payer: 59 | Admitting: Registered Nurse

## 2018-11-01 ENCOUNTER — Ambulatory Visit (INDEPENDENT_AMBULATORY_CARE_PROVIDER_SITE_OTHER): Payer: 59

## 2018-11-01 ENCOUNTER — Other Ambulatory Visit: Payer: Self-pay

## 2018-11-01 VITALS — BP 120/68 | HR 95 | Temp 98.5°F | Resp 16 | Ht 62.6 in | Wt 294.0 lb

## 2018-11-01 DIAGNOSIS — M25511 Pain in right shoulder: Secondary | ICD-10-CM

## 2018-11-01 MED ORDER — CYCLOBENZAPRINE HCL 5 MG PO TABS: 5.0000 mg | ORAL_TABLET | Freq: Three times a day (TID) | ORAL | 0 refills | Status: DC | PRN

## 2018-11-01 MED ORDER — MELOXICAM 15 MG PO TABS: ORAL_TABLET | ORAL | 0 refills | Status: DC

## 2018-11-01 NOTE — Patient Instructions (Signed)
° ° ° °  If you have lab work done today you will be contacted with your lab results within the next 2 weeks.  If you have not heard from us then please contact us. The fastest way to get your results is to register for My Chart. ° ° °IF you received an x-ray today, you will receive an invoice from [facility]. Please contact  Radiology at [phone] with questions or concerns regarding your invoice.  ° °IF you received labwork today, you will receive an invoice from [facility]. Please contact [facility] at [phone] with questions or concerns regarding your invoice.  ° °Our billing staff will not be able to assist you with questions regarding bills from these companies. ° °You will be contacted with the lab results as soon as they are available. The fastest way to get your results is to activate your My Chart account. Instructions are located on the last page of this paperwork. If you have not heard from us regarding the results in 2 weeks, please contact this office. °  ° ° ° °

## 2018-11-01 NOTE — Progress Notes (Signed)
Established Patient Office Visit  Subjective:  Patient ID: Amoya Fazzone, female    DOB: 1970-08-21  Age: 48 y.o. MRN: [id number]  CC:  Chief Complaint  Patient presents with  . Shoulder Pain    right arm pain x 5 days from moving water in her cart at the store. Pt states the pain is sharp .     HPI Shrika Tanna presents for shoulder pain in R shoulder States that she has had DJD and stenosis of spine giving her nerve compression pain in her arm previously - this feels different States that she was moving groceries in her cart when she felt a pull in her shoulder. It has been painful since. It has limited her ROM and her ability to complete her work tasks. Able to sleep somewhat well but has to be careful regarding positioning of her R arm when she sleeps.  No new numbness or tingling in her fingertips. No weakness in the extremity. Tenderness in trapezius and deltoid.   Past Medical History:  Diagnosis Date  . Abdominal wall seroma   . Anemia   . Arthritis   . Asthma   . B12 deficiency   . Back pain   . Carpal tunnel syndrome, bilateral   . Chest pain   . Colitis   . Constipation   . Dysmenorrhea   . Dyspnea   . Fatty liver   . GERD (gastroesophageal reflux disease)   . HLD (hyperlipidemia)   . HPV (human papilloma virus) infection    WARTS/ TCA [place]  . Hypertension   . Keratoconus   . Leg edema   . Obesity   . Obesity   . Osteoarthritis   . Palpitations   . Prediabetes   . Seasonal allergies     Past Surgical History:  Procedure Laterality Date  . GYNECOLOGIC CRYOSURGERY  1993   DYSPLASIA CERVIX/   . MYOMECTOMY  02/17/2011   Procedure: MYOMECTOMY;  Surgeon: Terrance Mass, MD;  Location: [facility];  Service: Gynecology;  Laterality: N/A;  I am hoping to get a 7:30am time on Dec 4, Dec 11 or Dec 12. It not available I will check on some 1:00 pm times. Thanks  . MYOMECTOMY ABDOMINAL APPROACH  04/31/2007  . OVARIAN CYST REMOVAL  02/17/2011   Procedure: OVARIAN  CYSTECTOMY;  Surgeon: Terrance Mass, MD;  Location: [facility];  Service: Gynecology;  Laterality: Left;    Family History  Problem Relation Age of Onset  . Hypertension Mother   . Breast cancer Mother   . Cancer Mother   . Hyperlipidemia Mother   . Thyroid disease Mother   . Obesity Mother   . Heart disease Maternal Grandmother   . Cancer Father        LUNG  . Breast cancer Maternal Aunt   . Diabetes Maternal Grandfather   . Cancer Paternal Aunt   . Ovarian cancer Paternal Aunt     Social History   Socioeconomic History  . Marital status: Single    Spouse name: Not on file  . Number of children: 0  . Years of education: Not on file  . Highest education level: Not on file  Occupational History  . Occupation: Sport and exercise psychologist, Environmental consultant college, admin    Employer: [employer]    Comment: [employer]  . Financial resource strain: Not on file  . Food insecurity    Worry: Not on file    Inability: Not on file  . Transportation  needs    Medical: Not on file    Non-medical: Not on file  Tobacco Use  . Smoking status: Never Smoker  . Smokeless tobacco: Never Used  . Tobacco comment: denies tobacco   Substance and Sexual Activity  . Alcohol use: No    Alcohol/week: 0.0 standard drinks  . Drug use: No  . Sexual activity: Yes    Birth control/protection: I.U.D.  Lifestyle  . Physical activity    Days per week: Not on file    Minutes per session: Not on file  . Stress: Not on file  Relationships  . Social Herbalist on phone: Not on file    Gets together: Not on file    Attends religious service: Not on file    Active member of club or organization: Not on file    Attends meetings of clubs or organizations: Not on file    Relationship status: Not on file  . Intimate partner violence    Fear of current or ex partner: Not on file    Emotionally abused: Not on file    Physically abused: Not on file    Forced sexual activity: Not on file  Other  Topics Concern  . Not on file  Social History Narrative   Graduate degree from [employer] in woman/gender studies.   Lives with her mother.    Outpatient Medications Prior to Visit  Medication Sig Dispense Refill  . fluticasone (FLONASE) 50 MCG/ACT nasal spray instill 1 spray into each nostril twice a day  0  . levonorgestrel (MIRENA) 20 MCG/24HR IUD 1 each by Intrauterine route once.    . loratadine (CLARITIN) 10 MG tablet Take 10 mg by mouth daily.    . metFORMIN (GLUCOPHAGE) 500 MG tablet Take 1 tablet (500 mg total) by mouth daily with breakfast. 30 tablet 0  . olmesartan-hydrochlorothiazide (BENICAR HCT) 20-12.5 MG tablet Take 1 tablet by mouth daily. 90 tablet 0  . Vitamin D, Ergocalciferol, (DRISDOL) 1.25 MG (50000 UT) CAPS capsule Take 1 capsule (50,000 Units total) by mouth 2 (two) times a week. 10 capsule 0  . cyclobenzaprine (FLEXERIL) 10 MG tablet TAKE 1 TABLET BY MOUTH THREE TIMES A DAY IF NEEDED FOR MUSCLE SPASM 30 tablet 1  . meloxicam (MOBIC) 15 MG tablet take 1 tablet by mouth daily if needed  0  . vitamin B-12 (CYANOCOBALAMIN) 1000 MCG tablet Take 3,000 mcg by mouth daily.      No facility-administered medications prior to visit.     Allergies  Allergen Reactions  . Esomeprazole Magnesium Shortness Of Breath    Nervousness; tolerates Aciphex.  . Adhesive [Tape] Itching and Rash    Itching and rash with adhesive tape  . Latex Itching and Dermatitis    Skin blistering  . Levocetirizine Dihydrochloride Other (See Comments)    Muscle jerks  . Nexium [Esomeprazole]     ROS Review of Systems  Constitutional: Negative.   HENT: Negative.   Eyes: Negative.   Respiratory: Negative.   Cardiovascular: Negative.   Gastrointestinal: Negative.   Endocrine: Negative.   Genitourinary: Negative.   Musculoskeletal: Positive for arthralgias and myalgias. Negative for back pain, gait problem, joint swelling, neck pain and neck stiffness.  Skin: Negative.   Allergic/Immunologic:  Negative.   Neurological: Negative.   Hematological: Negative.   Psychiatric/Behavioral: Negative.   All other systems reviewed and are negative.     Objective:    Physical Exam  Constitutional: She is oriented to person, place,  and time. She appears well-developed and well-nourished. No distress.  Cardiovascular: Normal rate and regular rhythm.  Pulmonary/Chest: Effort normal. No respiratory distress.  Musculoskeletal:        General: Tenderness present. No deformity or edema.     Right shoulder: She exhibits decreased range of motion, tenderness, bony tenderness and pain. She exhibits no swelling, no crepitus and normal strength.     Comments: R shoulder: pain in trapezius, clavicle, and deltoid. Minimal joint line tenderness. Pain with abduction and flexion of around 100 degrees. Smooth adduction. Negative can emptying, negative neer.   Neurological: She is alert and oriented to person, place, and time.  Skin: Skin is warm and dry. No rash noted. She is not diaphoretic. No erythema. No pallor.  Psychiatric: She has a normal mood and affect. Her behavior is normal. Judgment and thought content normal.    BP 120/68   Pulse 95   Temp 98.5 F (36.9 C) (Oral)   Resp 16   Ht 5' 2.6" (1.59 m)   Wt 294 lb (133.4 kg)   SpO2 98%   BMI 52.75 kg/m  Wt Readings from Last 3 Encounters:  11/01/18 294 lb (133.4 kg)  09/21/18 288 lb (130.6 kg)  09/01/18 288 lb (130.6 kg)     There are no preventive care reminders to display for this patient.  There are no preventive care reminders to display for this patient.  Lab Results  Component Value Date   TSH 3.190 09/07/2017   Lab Results  Component Value Date   WBC 12.0 (H) 12/21/2017   HGB 13.7 12/21/2017   HCT 42.0 12/21/2017   MCV 81 12/21/2017   PLT 417 (H) 03/05/2017   Lab Results  Component Value Date   NA 144 09/01/2018   K 3.8 09/01/2018   CO2 26 09/01/2018   GLUCOSE 103 (H) 09/01/2018   BUN 12 09/01/2018   CREATININE  0.65 09/01/2018   BILITOT 0.4 09/01/2018   ALKPHOS 122 (H) 09/01/2018   AST 40 09/01/2018   ALT 63 (H) 09/01/2018   PROT 6.9 09/01/2018   ALBUMIN 4.2 09/01/2018   CALCIUM 9.6 09/01/2018   ANIONGAP 6 10/31/2014   Lab Results  Component Value Date   CHOL 243 (H) 09/01/2018   Lab Results  Component Value Date   HDL 52 09/01/2018   Lab Results  Component Value Date   LDLCALC 154 (H) 09/01/2018   Lab Results  Component Value Date   TRIG 186 (H) 09/01/2018   Lab Results  Component Value Date   CHOLHDL 4.6 (H) 03/05/2017   Lab Results  Component Value Date   HGBA1C 5.9 (H) 09/01/2018      Assessment & Plan:   Problem List Items Addressed This Visit    None    Visit Diagnoses    Pain in joint of right shoulder    -  Primary   Relevant Medications   cyclobenzaprine (FLEXERIL) 5 MG tablet   meloxicam (MOBIC) 15 MG tablet   Other Relevant Orders   DG Shoulder Right (Completed)      Meds ordered this encounter  Medications  . cyclobenzaprine (FLEXERIL) 5 MG tablet    Sig: Take 1 tablet (5 mg total) by mouth 3 (three) times daily as needed for muscle spasms.    Dispense:  30 tablet    Refill:  0    Order Specific Question:   Supervising Provider    Answer:   Delia Chimes A [id number]  . meloxicam (MOBIC)  15 MG tablet    Sig: take 1 tablet by mouth daily if needed    Dispense:  30 tablet    Refill:  0    Order Specific Question:   Supervising Provider    Answer:   Forrest Moron [id number]    Follow-up: Return if symptoms worsen or fail to improve, for Pt interested in returning for [facility] visit.   PLAN  Given negative can emptying and neer tests, as well as tenderness in trapezius and negative Xray, feel that this is muscle strain vs. Intraarticular injury or fracture.   Flexeril 5mg  PO qd prn and Mobin 15mg  PO qd prn. Discussed side effects and nonpharm relief.  Return to clinic if pain doesn't resolve - PT is next step. Pt demonstrates understanding of  plan and is in agreement with plan  Patient encouraged to call clinic with any questions, comments, or concerns.   Maximiano Coss, NP

## 2018-11-02 ENCOUNTER — Ambulatory Visit (INDEPENDENT_AMBULATORY_CARE_PROVIDER_SITE_OTHER): Payer: 59 | Admitting: Physician Assistant

## 2018-11-04 ENCOUNTER — Encounter (INDEPENDENT_AMBULATORY_CARE_PROVIDER_SITE_OTHER): Payer: Self-pay | Admitting: Physician Assistant

## 2018-11-04 ENCOUNTER — Other Ambulatory Visit: Payer: Self-pay

## 2018-11-04 ENCOUNTER — Telehealth (INDEPENDENT_AMBULATORY_CARE_PROVIDER_SITE_OTHER): Payer: 59 | Admitting: Physician Assistant

## 2018-11-04 DIAGNOSIS — R7303 Prediabetes: Secondary | ICD-10-CM | POA: Diagnosis not present

## 2018-11-04 DIAGNOSIS — Z6841 Body Mass Index (BMI) 40.0 and over, adult: Secondary | ICD-10-CM

## 2018-11-08 ENCOUNTER — Other Ambulatory Visit: Payer: Self-pay

## 2018-11-09 ENCOUNTER — Encounter: Payer: 59 | Admitting: Obstetrics & Gynecology

## 2018-11-09 NOTE — Progress Notes (Signed)
Office: [phone]  /  Fax: [phone] TeleHealth Visit:  Mindy Collins has verbally consented to this TeleHealth visit today. The patient is located at home, the provider is located at the [employer] [facility] office. The participants in this visit include the listed provider and patient and any and all parties involved. The visit was conducted today via FaceTime.  HPI:   Chief Complaint: OBESITY Mindy Collins is here to discuss her progress with her obesity treatment plan. She is on the Category 3 plan and is following her eating plan approximately 0 % of the time. She states she is exercising 0 minutes 0 times per week. 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 We were unable to weigh the patient today for this TeleHealth visit. She feels as if she has gained weight since her last visit (weight not reported). She has lost 0 lbs since starting treatment with [place].  Pre-Diabetes Mindy Collins has a diagnosis of prediabetes based on her elevated Hgb A1c and was informed this puts her at greater risk of developing diabetes. Mindy Collins is on metformin and she denies nausea, vomiting or diarrhea. She continues to work on diet and exercise to decrease risk of diabetes. She denies polyphagia.  ASSESSMENT AND PLAN:  Prediabetes  Class 3 severe obesity with serious comorbidity and body mass index (BMI) of 50.0 to 59.9 in adult, unspecified obesity type (Mindy Collins)  PLAN:  Pre-Diabetes Mindy Collins will continue to work on weight loss, exercise, and decreasing simple carbohydrates in her diet to help decrease the risk of diabetes. She was informed that eating too many simple carbohydrates or too many calories at one sitting increases the likelihood of GI side effects. Mindy Collins will continue metformin and follow up with [place] as directed to monitor her progress.  Obesity Mindy Collins is currently in the action stage of change. As such, her goal is to continue with weight loss  efforts She has agreed to follow the Category 3 plan Mindy Collins has been instructed to work up to a goal of 150 minutes of combined cardio and strengthening exercise per week for weight loss and overall health benefits. We discussed the following Behavioral Modification Strategies today: increasing lean protein intake and no skipping meals  Mindy Collins has agreed to follow up with our clinic in 2 weeks. She was informed of the importance of frequent follow up visits to maximize her success with intensive lifestyle modifications for her multiple health conditions.  I spent > than 50% of the 25 minute visit on counseling as documented in the note.   ALLERGIES: Allergies  Allergen Reactions   Esomeprazole Magnesium Shortness Of Breath    Nervousness; tolerates Aciphex.   Adhesive [Tape] Itching and Rash    Itching and rash with adhesive tape   Latex Itching and Dermatitis    Skin blistering   Levocetirizine Dihydrochloride Other (See Comments)    Muscle jerks   Nexium [Esomeprazole]     MEDICATIONS: Current Outpatient Medications on File Prior to Visit  Medication Sig Dispense Refill   cyclobenzaprine (FLEXERIL) 5 MG tablet Take 1 tablet (5 mg total) by mouth 3 (three) times daily as needed for muscle spasms. 30 tablet 0   fluticasone (FLONASE) 50 MCG/ACT nasal spray instill 1 spray into each nostril twice a day  0   levonorgestrel (MIRENA) 20 MCG/24HR IUD 1 each by Intrauterine route once.     loratadine (CLARITIN) 10 MG tablet Take 10 mg by  mouth daily.     meloxicam (MOBIC) 15 MG tablet take 1 tablet by mouth daily if needed 30 tablet 0   metFORMIN (GLUCOPHAGE) 500 MG tablet Take 1 tablet (500 mg total) by mouth daily with breakfast. 30 tablet 0   olmesartan-hydrochlorothiazide (BENICAR HCT) 20-12.5 MG tablet Take 1 tablet by mouth daily. 90 tablet 0   Vitamin D, Ergocalciferol, (DRISDOL) 1.25 MG (50000 UT) CAPS capsule Take 1 capsule (50,000 Units total) by mouth 2 (two) times a  week. 10 capsule 0   No current facility-administered medications on file prior to visit.     PAST MEDICAL HISTORY: Past Medical History:  Diagnosis Date   Abdominal wall seroma    Anemia    Arthritis    Asthma    B12 deficiency    Back pain    Carpal tunnel syndrome, bilateral    Chest pain    Colitis    Constipation    Dysmenorrhea    Dyspnea    Fatty liver    GERD (gastroesophageal reflux disease)    HLD (hyperlipidemia)    HPV (human papilloma virus) infection    WARTS/ TCA TX   Hypertension    Keratoconus    Leg edema    Obesity    Obesity    Osteoarthritis    Palpitations    Prediabetes    Seasonal allergies     PAST SURGICAL HISTORY: Past Surgical History:  Procedure Laterality Date   GYNECOLOGIC CRYOSURGERY  1993   DYSPLASIA CERVIX/    MYOMECTOMY  02/17/2011   Procedure: MYOMECTOMY;  Surgeon: Terrance Mass, MD;  Location: [facility];  Service: Gynecology;  Laterality: N/A;  I am hoping to get a 7:30am time on Dec 4, Dec 11 or Dec 12. It not available I will check on some 1:00 pm times. Thanks   MYOMECTOMY ABDOMINAL APPROACH  04/31/2007   OVARIAN CYST REMOVAL  02/17/2011   Procedure: OVARIAN CYSTECTOMY;  Surgeon: Terrance Mass, MD;  Location: [facility];  Service: Gynecology;  Laterality: Left;    SOCIAL HISTORY: Social History   Tobacco Use   Smoking status: Never Smoker   Smokeless tobacco: Never Used   Tobacco comment: denies tobacco   Substance Use Topics   Alcohol use: No    Alcohol/week: 0.0 standard drinks   Drug use: No    FAMILY HISTORY: Family History  Problem Relation Age of Onset   Hypertension Mother    Breast cancer Mother    Cancer Mother    Hyperlipidemia Mother    Thyroid disease Mother    Obesity Mother    Heart disease Maternal Grandmother    Cancer Father        LUNG   Breast cancer Maternal Aunt    Diabetes Maternal Grandfather    Cancer Paternal Aunt    Ovarian cancer  Paternal Aunt     ROS: Review of Systems  Constitutional: Negative for weight loss.  Gastrointestinal: Negative for diarrhea, nausea and vomiting.  Endo/Heme/Allergies:       Negative for polyphagia    PHYSICAL EXAM: Pt in no acute distress  RECENT LABS AND TESTS: BMET    Component Value Date/Time   NA 144 09/01/2018 0935   K 3.8 09/01/2018 0935   CL 102 09/01/2018 0935   CO2 26 09/01/2018 0935   GLUCOSE 103 (H) 09/01/2018 0935   GLUCOSE 99 02/22/2016 1206   BUN 12 09/01/2018 0935   CREATININE 0.65 09/01/2018 0935   CREATININE 0.75 02/22/2016  1206   CALCIUM 9.6 09/01/2018 0935   GFRNONAA 107 09/01/2018 0935   GFRAA 123 09/01/2018 0935   Lab Results  Component Value Date   HGBA1C 5.9 (H) 09/01/2018   HGBA1C 5.9 (H) 12/21/2017   HGBA1C 6.0 (H) 09/07/2017   HGBA1C 5.9 (H) 03/05/2017   HGBA1C 6.0 (H) 09/27/2016   Lab Results  Component Value Date   INSULIN 19.3 09/01/2018   INSULIN 32.3 (H) 12/21/2017   INSULIN 14.8 09/07/2017   CBC    Component Value Date/Time   WBC 12.0 (H) 12/21/2017 0936   WBC 9.3 02/22/2016 1206   RBC 5.17 12/21/2017 0936   RBC 4.84 02/22/2016 1206   HGB 13.7 12/21/2017 0936   HGB 11.5 (L) 02/14/2011 1218   HCT 42.0 12/21/2017 0936   HCT 35.4 02/14/2011 1218   PLT 417 (H) 03/05/2017 1754   MCV 81 12/21/2017 0936   MCV 83.2 02/14/2011 1218   MCH 26.5 (L) 12/21/2017 0936   MCH 26.4 (L) 02/22/2016 1206   MCHC 32.6 12/21/2017 0936   MCHC 31.7 (L) 02/22/2016 1206   RDW 13.4 12/21/2017 0936   RDW 15.2 (H) 02/14/2011 1218   LYMPHSABS 2.2 12/21/2017 0936   LYMPHSABS 1.7 02/14/2011 1218   MONOABS 651 02/22/2016 1206   MONOABS 0.8 02/14/2011 1218   EOSABS 0.1 12/21/2017 0936   BASOSABS 0.1 12/21/2017 0936   BASOSABS 0.1 02/14/2011 1218   Iron/TIBC/Ferritin/ %Sat No results found for: IRON, TIBC, FERRITIN, IRONPCTSAT Lipid Panel     Component Value Date/Time   CHOL 243 (H) 09/01/2018 0935   TRIG 186 (H) 09/01/2018 0935   HDL 52  09/01/2018 0935   CHOLHDL 4.6 (H) 03/05/2017 1754   CHOLHDL 4.8 02/22/2016 1206   VLDL 21 02/22/2016 1206   LDLCALC 154 (H) 09/01/2018 0935   LDLDIRECT 141.6 04/16/2010 0743   Hepatic Function Panel     Component Value Date/Time   PROT 6.9 09/01/2018 0935   ALBUMIN 4.2 09/01/2018 0935   AST 40 09/01/2018 0935   ALT 63 (H) 09/01/2018 0935   ALKPHOS 122 (H) 09/01/2018 0935   BILITOT 0.4 09/01/2018 0935      Component Value Date/Time   TSH 3.190 09/07/2017 0917   TSH 2.190 03/05/2017 1754   TSH 1.72 02/22/2016 1206     Ref. Range 09/01/2018 09:35  Vitamin D, 25-Hydroxy Latest Ref Range: 30.0 - 100.0 ng/mL 14.2 (L)   I, Doreene Nest, am acting as transcriptionist for Abby Potash, PA-C I, Abby Potash, PA-C have reviewed above note and agree with its content

## 2018-11-25 ENCOUNTER — Other Ambulatory Visit (INDEPENDENT_AMBULATORY_CARE_PROVIDER_SITE_OTHER): Payer: Self-pay | Admitting: Physician Assistant

## 2018-11-25 ENCOUNTER — Encounter (INDEPENDENT_AMBULATORY_CARE_PROVIDER_SITE_OTHER): Payer: Self-pay | Admitting: Physician Assistant

## 2018-11-25 DIAGNOSIS — E559 Vitamin D deficiency, unspecified: Secondary | ICD-10-CM

## 2018-11-25 MED ORDER — VITAMIN D (ERGOCALCIFEROL) 1.25 MG (50000 UNIT) PO CAPS: 50000.0000 [IU] | ORAL_CAPSULE | ORAL | 0 refills | Status: DC

## 2018-11-30 ENCOUNTER — Ambulatory Visit: Payer: Self-pay

## 2018-12-02 ENCOUNTER — Other Ambulatory Visit: Payer: Self-pay | Admitting: Registered Nurse

## 2018-12-02 DIAGNOSIS — M25511 Pain in right shoulder: Secondary | ICD-10-CM

## 2018-12-02 MED ORDER — MELOXICAM 15 MG PO TABS: ORAL_TABLET | ORAL | 0 refills | Status: DC

## 2018-12-02 NOTE — Telephone Encounter (Signed)
Medication refill: meloxicam (MOBIC) 15 MG tablet [id number]    Pharmacy:  [facility] [address], [place] AT [place] [phone] (Phone) [phone] (Fax)   Aware of turn around time

## 2018-12-13 ENCOUNTER — Telehealth (INDEPENDENT_AMBULATORY_CARE_PROVIDER_SITE_OTHER): Payer: 59 | Admitting: Physician Assistant

## 2018-12-13 ENCOUNTER — Other Ambulatory Visit: Payer: Self-pay

## 2018-12-13 ENCOUNTER — Encounter (INDEPENDENT_AMBULATORY_CARE_PROVIDER_SITE_OTHER): Payer: Self-pay | Admitting: Physician Assistant

## 2018-12-13 DIAGNOSIS — Z6841 Body Mass Index (BMI) 40.0 and over, adult: Secondary | ICD-10-CM

## 2018-12-13 DIAGNOSIS — E559 Vitamin D deficiency, unspecified: Secondary | ICD-10-CM

## 2018-12-14 NOTE — Progress Notes (Signed)
Office: [phone]  /  Fax: [phone] TeleHealth Visit:  Mindy Collins has verbally consented to this TeleHealth visit today. The patient is located at home, the provider is located at the [employer] [facility] office. The participants in this visit include the listed provider and patient and any and all parties involved. The visit was conducted today via FaceTime.  HPI:   Chief Complaint: OBESITY Mindy Collins is here to discuss her progress with her obesity treatment plan. She is on the Category 3 plan and is following her eating plan approximately 0 % of the time. She states she is exercising 0 minutes 0 times per week. 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 We were unable to weigh the patient today for this TeleHealth visit. She feels as if she has maintained weight since her last visit. She has lost 0 lbs since starting treatment with [place].  Pre-Diabetes Mindy Collins has a diagnosis of prediabetes based on her elevated Hgb A1c and was informed this puts her at greater risk of developing diabetes. Mindy Collins is taking metformin currently and she continues to work on diet and exercise to decrease risk of diabetes. She denies nausea, vomiting, diarrhea or polyphagia.  ASSESSMENT AND PLAN:  Vitamin D deficiency  Class 3 severe obesity with serious comorbidity and body mass index (BMI) of 50.0 to 59.9 in adult, unspecified obesity type ([place])  PLAN:  Pre-Diabetes Mindy Collins will continue to work on weight loss, exercise, and decreasing simple carbohydrates in her diet to help decrease the risk of diabetes. She was informed that eating too many simple carbohydrates or too many calories at one sitting increases the likelihood of GI side effects. Mindy Collins will continue metformin and follow up with [place] as directed to monitor her progress.  Obesity Mindy Collins is currently in the action stage of change. As such, her goal is to continue with weight loss efforts She  has agreed to keep a food journal with 450 to 600 calories and 40 grams of protein at supper daily and follow the Category 3 plan Mindy Collins has been instructed to work up to a goal of 150 minutes of combined cardio and strengthening exercise per week for weight loss and overall health benefits. We discussed the following Behavioral Modification Strategies today: no skipping meals, keeping healthy foods in the home and work on meal planning and easy cooking plans  Mindy Collins has agreed to follow up with our clinic in 3 weeks. She was informed of the importance of frequent follow up visits to maximize her success with intensive lifestyle modifications for her multiple health conditions.  ALLERGIES: Allergies  Allergen Reactions   Esomeprazole Magnesium Shortness Of Breath    Nervousness; tolerates Aciphex.   Adhesive [Tape] Itching and Rash    Itching and rash with adhesive tape   Latex Itching and Dermatitis    Skin blistering   Levocetirizine Dihydrochloride Other (See Comments)    Muscle jerks   Nexium [Esomeprazole]     MEDICATIONS: Current Outpatient Medications on File Prior to Visit  Medication Sig Dispense Refill   cyclobenzaprine (FLEXERIL) 5 MG tablet Take 1 tablet (5 mg total) by mouth 3 (three) times daily as needed for muscle spasms. 30 tablet 0   fluticasone (FLONASE) 50 MCG/ACT nasal spray instill 1 spray into each nostril twice a day  0   levonorgestrel (MIRENA) 20 MCG/24HR IUD 1 each by Intrauterine route once.     loratadine (CLARITIN) 10 MG tablet Take 10  mg by mouth daily.     meloxicam (MOBIC) 15 MG tablet take 1 tablet by mouth daily if needed 30 tablet 0   metFORMIN (GLUCOPHAGE) 500 MG tablet Take 1 tablet (500 mg total) by mouth daily with breakfast. 30 tablet 0   olmesartan-hydrochlorothiazide (BENICAR HCT) 20-12.5 MG tablet Take 1 tablet by mouth daily. 90 tablet 0   Vitamin D, Ergocalciferol, (DRISDOL) 1.25 MG (50000 UT) CAPS capsule Take 1 capsule (50,000  Units total) by mouth 2 (two) times a week. 6 capsule 0   No current facility-administered medications on file prior to visit.     PAST MEDICAL HISTORY: Past Medical History:  Diagnosis Date   Abdominal wall seroma    Anemia    Arthritis    Asthma    B12 deficiency    Back pain    Carpal tunnel syndrome, bilateral    Chest pain    Colitis    Constipation    Dysmenorrhea    Dyspnea    Fatty liver    GERD (gastroesophageal reflux disease)    HLD (hyperlipidemia)    HPV (human papilloma virus) infection    WARTS/ TCA TX   Hypertension    Keratoconus    Leg edema    Obesity    Obesity    Osteoarthritis    Palpitations    Prediabetes    Seasonal allergies     PAST SURGICAL HISTORY: Past Surgical History:  Procedure Laterality Date   GYNECOLOGIC CRYOSURGERY  1993   DYSPLASIA CERVIX/    MYOMECTOMY  02/17/2011   Procedure: MYOMECTOMY;  Surgeon: Terrance Mass, MD;  Location: [facility];  Service: Gynecology;  Laterality: N/A;  I am hoping to get a 7:30am time on Dec 4, Dec 11 or Dec 12. It not available I will check on some 1:00 pm times. Thanks   MYOMECTOMY ABDOMINAL APPROACH  04/31/2007   OVARIAN CYST REMOVAL  02/17/2011   Procedure: OVARIAN CYSTECTOMY;  Surgeon: Terrance Mass, MD;  Location: [facility];  Service: Gynecology;  Laterality: Left;    SOCIAL HISTORY: Social History   Tobacco Use   Smoking status: Never Smoker   Smokeless tobacco: Never Used   Tobacco comment: denies tobacco   Substance Use Topics   Alcohol use: No    Alcohol/week: 0.0 standard drinks   Drug use: No    FAMILY HISTORY: Family History  Problem Relation Age of Onset   Hypertension Mother    Breast cancer Mother    Cancer Mother    Hyperlipidemia Mother    Thyroid disease Mother    Obesity Mother    Heart disease Maternal Grandmother    Cancer Father        LUNG   Breast cancer Maternal Aunt    Diabetes Maternal Grandfather    Cancer  Paternal Aunt    Ovarian cancer Paternal Aunt     ROS: Review of Systems  Constitutional: Positive for weight loss.  Gastrointestinal: Negative for diarrhea, nausea and vomiting.  Musculoskeletal:       Negative for muscle weakness  Endo/Heme/Allergies:       Negative for polyphagia    PHYSICAL EXAM: Pt in no acute distress  RECENT LABS AND TESTS: BMET    Component Value Date/Time   NA 144 09/01/2018 0935   K 3.8 09/01/2018 0935   CL 102 09/01/2018 0935   CO2 26 09/01/2018 0935   GLUCOSE 103 (H) 09/01/2018 0935   GLUCOSE 99 02/22/2016 1206   BUN  12 09/01/2018 0935   CREATININE 0.65 09/01/2018 0935   CREATININE 0.75 02/22/2016 1206   CALCIUM 9.6 09/01/2018 0935   GFRNONAA 107 09/01/2018 0935   GFRAA 123 09/01/2018 0935   Lab Results  Component Value Date   HGBA1C 5.9 (H) 09/01/2018   HGBA1C 5.9 (H) 12/21/2017   HGBA1C 6.0 (H) 09/07/2017   HGBA1C 5.9 (H) 03/05/2017   HGBA1C 6.0 (H) 09/27/2016   Lab Results  Component Value Date   INSULIN 19.3 09/01/2018   INSULIN 32.3 (H) 12/21/2017   INSULIN 14.8 09/07/2017   CBC    Component Value Date/Time   WBC 12.0 (H) 12/21/2017 0936   WBC 9.3 02/22/2016 1206   RBC 5.17 12/21/2017 0936   RBC 4.84 02/22/2016 1206   HGB 13.7 12/21/2017 0936   HGB 11.5 (L) 02/14/2011 1218   HCT 42.0 12/21/2017 0936   HCT 35.4 02/14/2011 1218   PLT 417 (H) 03/05/2017 1754   MCV 81 12/21/2017 0936   MCV 83.2 02/14/2011 1218   MCH 26.5 (L) 12/21/2017 0936   MCH 26.4 (L) 02/22/2016 1206   MCHC 32.6 12/21/2017 0936   MCHC 31.7 (L) 02/22/2016 1206   RDW 13.4 12/21/2017 0936   RDW 15.2 (H) 02/14/2011 1218   LYMPHSABS 2.2 12/21/2017 0936   LYMPHSABS 1.7 02/14/2011 1218   MONOABS 651 02/22/2016 1206   MONOABS 0.8 02/14/2011 1218   EOSABS 0.1 12/21/2017 0936   BASOSABS 0.1 12/21/2017 0936   BASOSABS 0.1 02/14/2011 1218   Iron/TIBC/Ferritin/ %Sat No results found for: IRON, TIBC, FERRITIN, IRONPCTSAT Lipid Panel     Component  Value Date/Time   CHOL 243 (H) 09/01/2018 0935   TRIG 186 (H) 09/01/2018 0935   HDL 52 09/01/2018 0935   CHOLHDL 4.6 (H) 03/05/2017 1754   CHOLHDL 4.8 02/22/2016 1206   VLDL 21 02/22/2016 1206   LDLCALC 154 (H) 09/01/2018 0935   LDLDIRECT 141.6 04/16/2010 0743   Hepatic Function Panel     Component Value Date/Time   PROT 6.9 09/01/2018 0935   ALBUMIN 4.2 09/01/2018 0935   AST 40 09/01/2018 0935   ALT 63 (H) 09/01/2018 0935   ALKPHOS 122 (H) 09/01/2018 0935   BILITOT 0.4 09/01/2018 0935      Component Value Date/Time   TSH 3.190 09/07/2017 0917   TSH 2.190 03/05/2017 1754   TSH 1.72 02/22/2016 1206     Ref. Range 09/01/2018 09:35  Vitamin D, 25-Hydroxy Latest Ref Range: 30.0 - 100.0 ng/mL 14.2 (L)    I, Doreene Nest, am acting as transcriptionist for Abby Potash, PA-C I, Abby Potash, PA-C have reviewed above note and agree with its content

## 2018-12-24 IMAGING — MR MR CERVICAL SPINE W/O CM
4 of 5 series · 27 of 48 positions shown · non-contrast
Comparison: Radiography 12/24/2016

CLINICAL DATA: Neck pain with positive x-ray. Right cervical
radiculopathy.

EXAM:
MRI CERVICAL SPINE WITHOUT CONTRAST
TECHNIQUE: Multiplanar, multisequence MR imaging of the cervical spine was
performed. No intravenous contrast was administered.

[Series 6: T1 · sagittal · 3.0mm · 0.66mm/px · 6 of 15 slices shown]
[im 1/15]
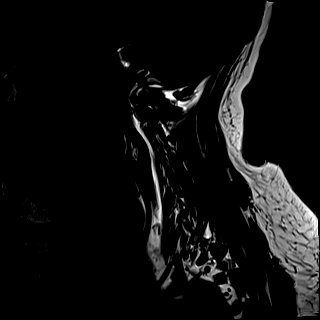
[im 3/15]
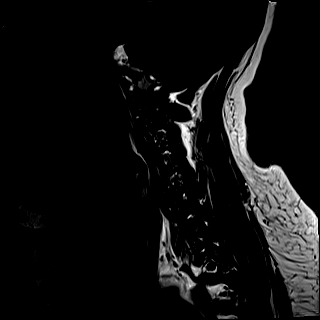
[im 6/15]
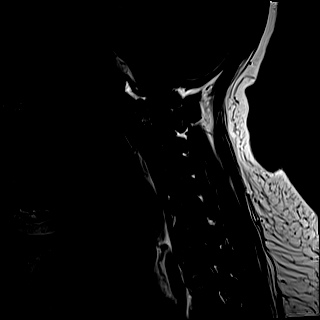
[im 9/15]
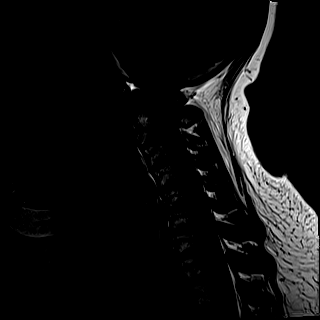
[im 12/15]
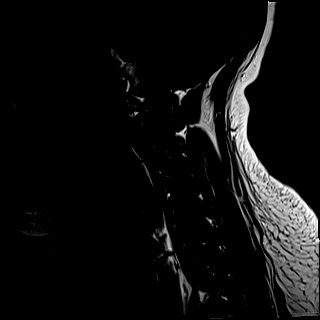
[im 15/15]
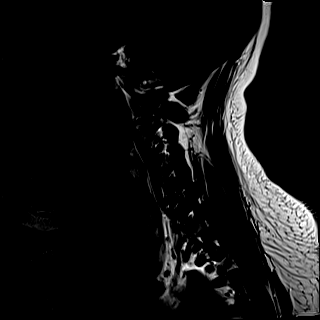

[Series 8: T2 · sagittal · 3.0mm · 0.55mm/px · 7 of 15 slices shown (1 of 2)]
[im 1/15]
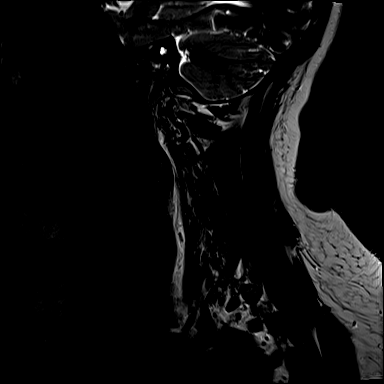
[im 3/15]
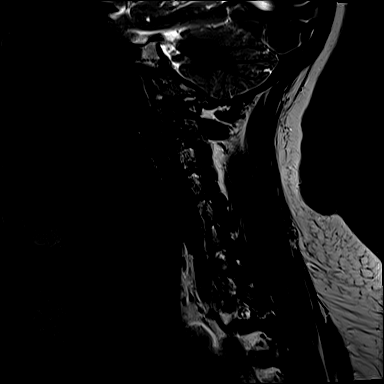
[im 5/15]
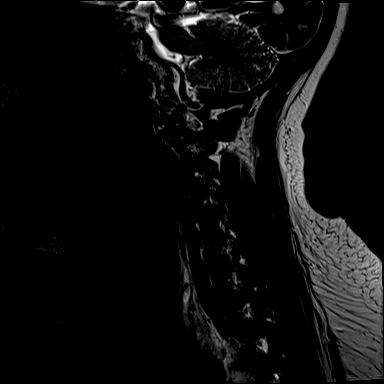
[im 8/15]
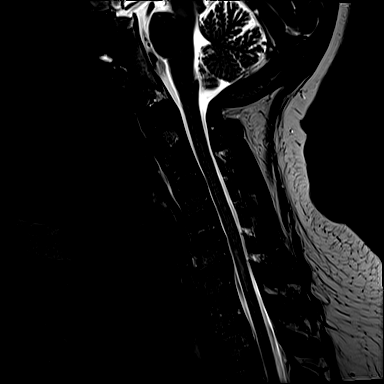
[im 10/15]
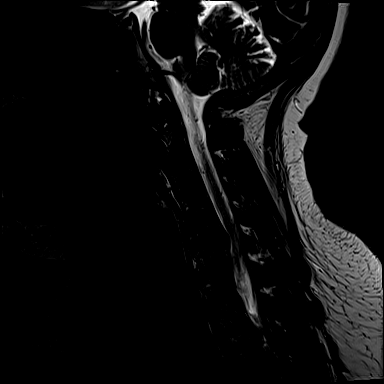
[im 12/15]
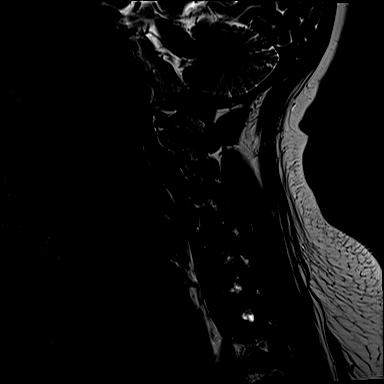
[im 15/15]
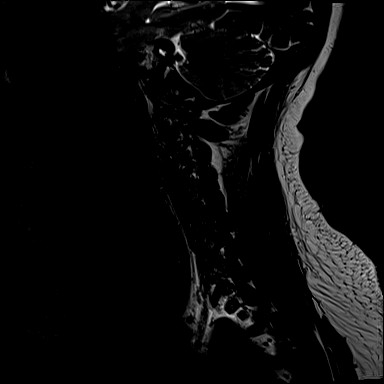

[Series 9: STIR · sagittal · 3.0mm · 0.33mm/px · 6 of 15 slices shown]
[im 1/15]
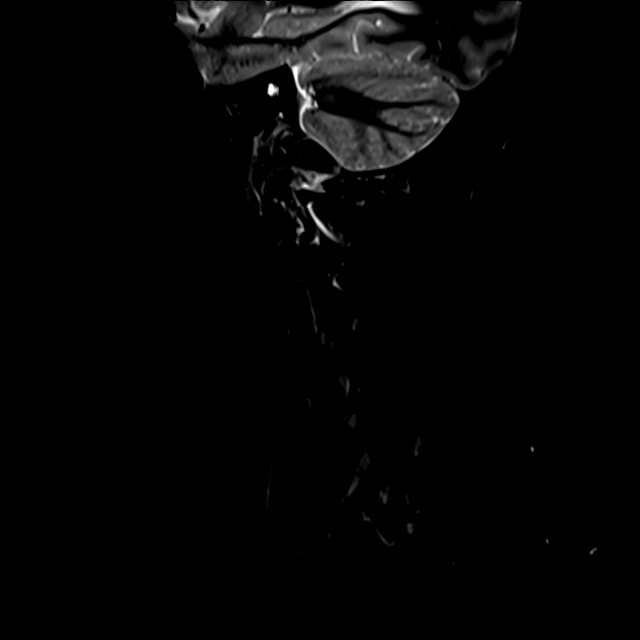
[im 3/15]
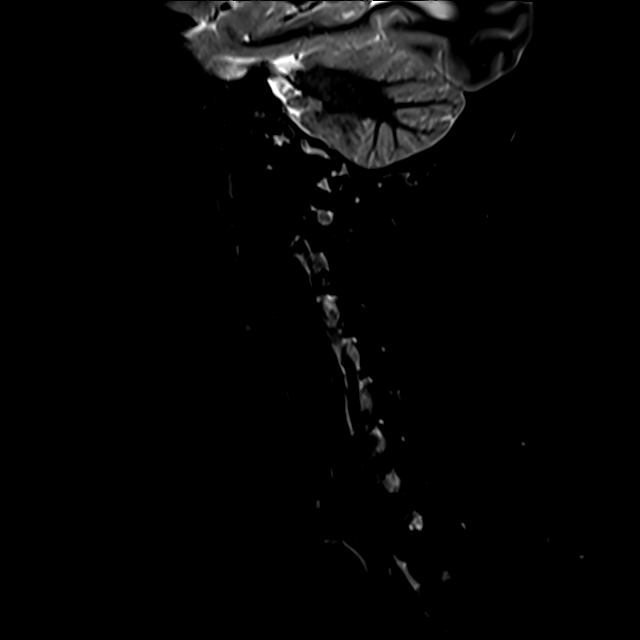
[im 5/15]
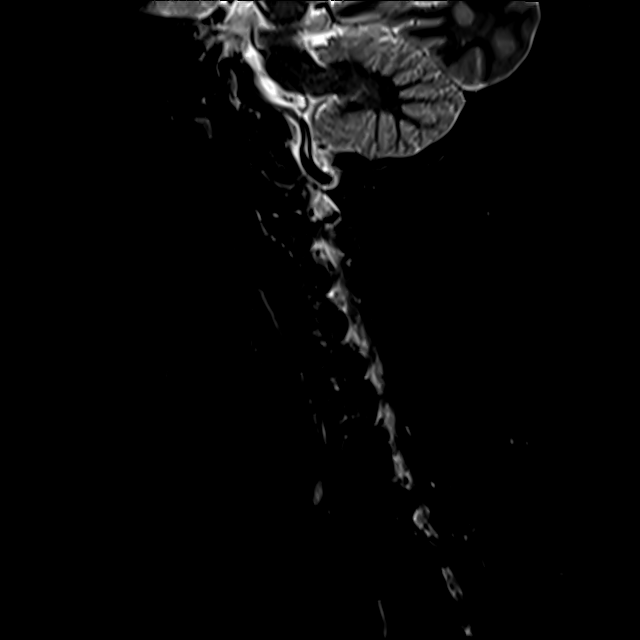
[im 8/15]
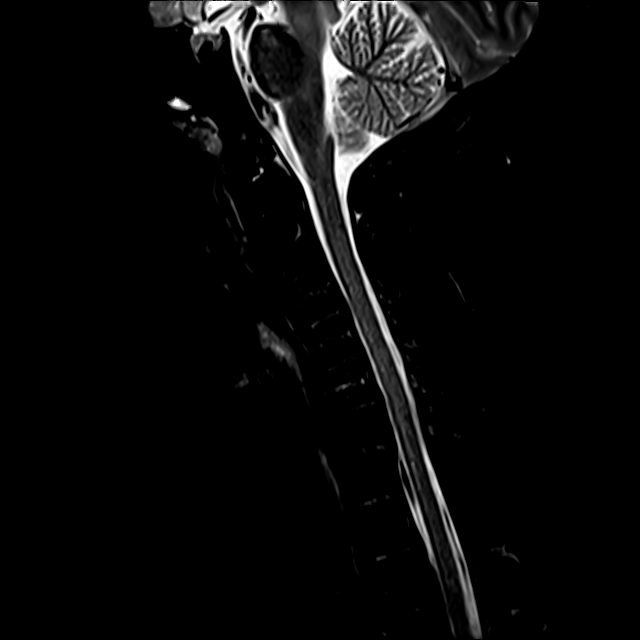
[im 10/15]
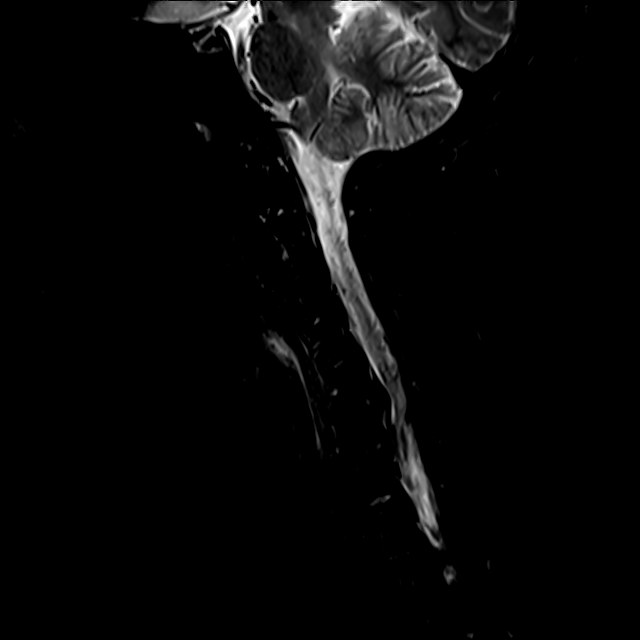
[im 12/15]
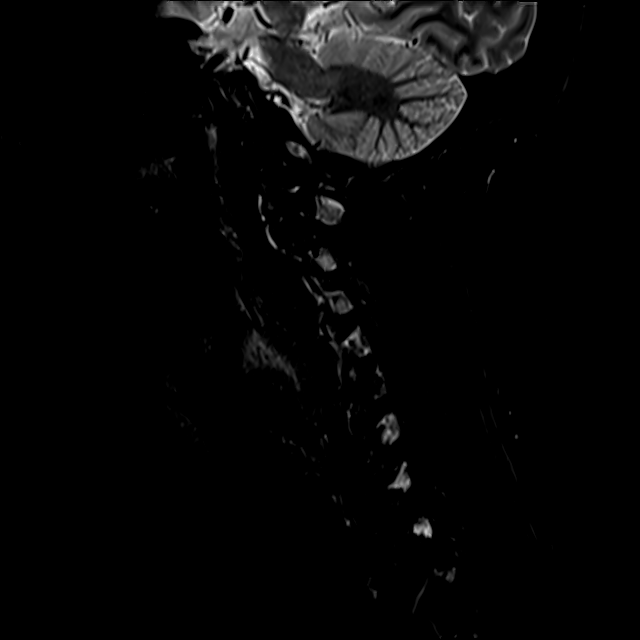

[Series 10: T2 · axial · 3.0mm · 0.50mm/px · z∈[-36,+53]mm · 8 of 30 slices shown (2 of 2)]
[im 1/30]
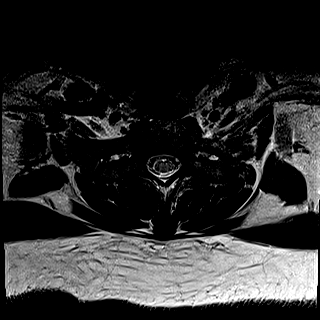
[im 5/30]
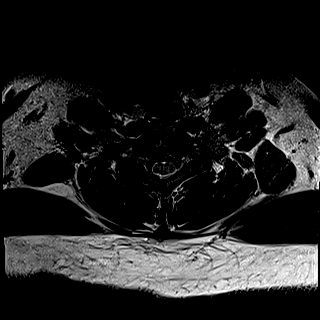
[im 9/30]
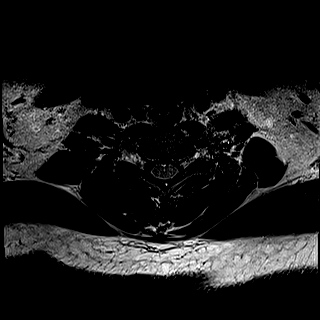
[im 14/30]
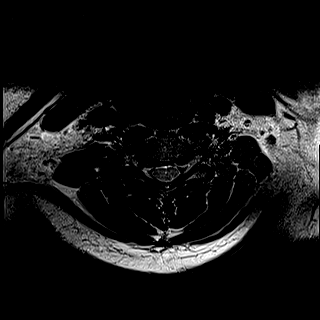
[im 16/30]
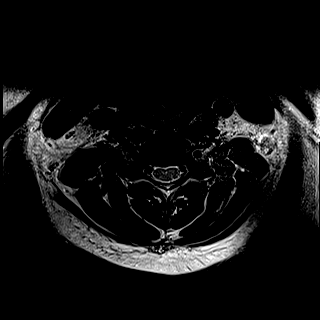
[im 21/30]
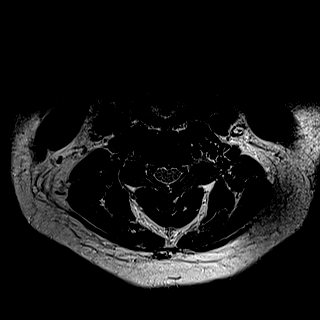
[im 25/30]
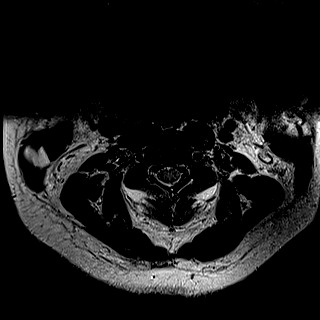
[im 30/30]
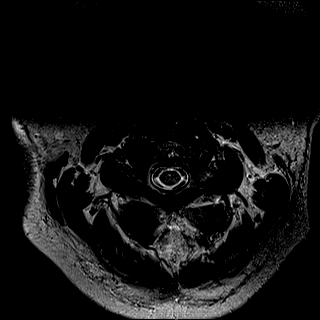

[27 of 48 positions shown; findings below may reference images not displayed]

FINDINGS: Alignment: Straightening/reversal of cervical lordosis.

Vertebrae: No fracture, evidence of discitis, or bone lesion.

Cord: Normal signal and morphology.

Posterior Fossa, vertebral arteries, paraspinal tissues: Small
lipoma in the upper right sternocleidomastoid.

Disc levels:

C2-3: Unremarkable.

C3-4: Tiny central protrusion suggested on axial slices.

C4-5: Unremarkable.

C5-6: Mild disc narrowing with a right foraminal protrusion
impinging on the right C6 nerve root. Uncovertebral spur contributes
to this stenosis based on radiography 12/24/2016. Patent canal and
left foramen

C6-7: Mild disc narrowing with a left foraminal protrusion and C7
impingement.

C7-T1:Unremarkable.
IMPRESSION: 1. C5-6 right foraminal protrusion with right C6 impingement. An
uncovertebral spur contributes to the stenosis based on December 2016 radiography.
2. C6-7 left foraminal protrusion impinging on the left C7 root.

## 2018-12-30 ENCOUNTER — Ambulatory Visit (INDEPENDENT_AMBULATORY_CARE_PROVIDER_SITE_OTHER): Payer: 59 | Admitting: Physician Assistant

## 2019-01-03 ENCOUNTER — Other Ambulatory Visit: Payer: Self-pay

## 2019-01-03 ENCOUNTER — Ambulatory Visit (INDEPENDENT_AMBULATORY_CARE_PROVIDER_SITE_OTHER): Payer: 59 | Admitting: Physician Assistant

## 2019-01-03 ENCOUNTER — Encounter (INDEPENDENT_AMBULATORY_CARE_PROVIDER_SITE_OTHER): Payer: Self-pay | Admitting: Physician Assistant

## 2019-01-03 VITALS — BP 132/84 | HR 88 | Temp 98.5°F | Ht 63.0 in | Wt 295.0 lb

## 2019-01-03 DIAGNOSIS — E559 Vitamin D deficiency, unspecified: Secondary | ICD-10-CM

## 2019-01-03 DIAGNOSIS — Z9189 Other specified personal risk factors, not elsewhere classified: Secondary | ICD-10-CM | POA: Diagnosis not present

## 2019-01-03 DIAGNOSIS — R7303 Prediabetes: Secondary | ICD-10-CM | POA: Diagnosis not present

## 2019-01-03 DIAGNOSIS — Z6841 Body Mass Index (BMI) 40.0 and over, adult: Secondary | ICD-10-CM

## 2019-01-03 MED ORDER — METFORMIN HCL 500 MG PO TABS: 500.0000 mg | ORAL_TABLET | Freq: Every day | ORAL | 0 refills | Status: DC

## 2019-01-04 ENCOUNTER — Other Ambulatory Visit: Payer: Self-pay | Admitting: Registered Nurse

## 2019-01-04 DIAGNOSIS — M25511 Pain in right shoulder: Secondary | ICD-10-CM

## 2019-01-04 NOTE — Progress Notes (Signed)
Office: [phone]  /  Fax: [phone]   HPI:   Chief Complaint: OBESITY Mindy Collins is here to discuss her progress with her obesity treatment plan. She is on the Category 3 plan and is following her eating plan approximately 0 % of the time. She states she is exercising 0 minutes 0 times per week. Mindy Collins reports that due to stress at home and with work, she is not able to follow a structured plan.  Her weight is 295 lb (133.8 kg) today and has had a weight gain of 7 pounds since her last in-office visit. She has gained 15 lbs since starting treatment with [place].  Pre-Diabetes Mindy Collins has a diagnosis of prediabetes based on her elevated Hgb A1c and was informed this puts her at greater risk of developing diabetes. Mindy Collins is on metformin and she denies nausea, vomiting or diarrhea. She continues to work on diet and exercise to decrease risk of diabetes. She has no polyphagia.  At risk for diabetes Mindy Collins is at higher than average risk for developing diabetes due to her obesity an prediabetes. She currently denies polyuria or polydipsia.  Vitamin D deficiency Mindy Collins has a diagnosis of vitamin D deficiency. Mindy Collins is currently taking vit D and she denies nausea, vomiting or muscle weakness.  ASSESSMENT AND PLAN:  Prediabetes - Plan: metFORMIN (GLUCOPHAGE) 500 MG tablet  Vitamin D deficiency  At risk for diabetes mellitus  Class 3 severe obesity with serious comorbidity and body mass index (BMI) of 50.0 to 59.9 in adult, unspecified obesity type ([place])  PLAN:  Pre-Diabetes Mindy Collins will continue to work on weight loss, exercise, and decreasing simple carbohydrates in her diet to help decrease the risk of diabetes. She was informed that eating too many simple carbohydrates or too many calories at one sitting increases the likelihood of GI side effects. [name] agrees to continue metformin 500 mg daily with breakfast #30 with no refills and follow up with [place] as directed to monitor her  progress.  Diabetes risk counseling Mindy Collins was given extended (15 minutes) diabetes prevention counseling today. She is 48 y.o. female and has risk factors for diabetes including obesity and prediabetes. We discussed intensive lifestyle modifications today with an emphasis on weight loss as well as increasing exercise and decreasing simple carbohydrates in her diet.  Vitamin D Deficiency Mindy Collins was informed that low vitamin D levels contributes to fatigue and are associated with obesity, breast, and colon cancer. Mindy Collins agrees to continue to take prescription Vit D @50 ,000 IU two times a week #6 with no refills and she will follow up for routine testing of vitamin D, at least 2-3 times per year. She was informed of the risk of over-replacement of vitamin D and agrees to not increase her dose unless she discusses this with [place] first. Mindy Collins agrees to follow up as directed.  Obesity Mindy Collins is currently in the action stage of change. As such, her goal is to continue with weight loss efforts She has agreed to portion control better and make smarter food choices, such as increase vegetables and decrease simple carbohydrates  Mindy Collins has been instructed to work up to a goal of 150 minutes of combined cardio and strengthening exercise per week for weight loss and overall health benefits. We discussed the following Behavioral Modification Strategies today: keeping healthy foods in the home and work on meal planning and easy cooking plans  Mindy Collins has agreed to follow up with our clinic in 4 weeks. She was informed of the importance of frequent  follow up visits to maximize her success with intensive lifestyle modifications for her multiple health conditions.  ALLERGIES: Allergies  Allergen Reactions   Esomeprazole Magnesium Shortness Of Breath    Nervousness; tolerates Aciphex.   Adhesive [Tape] Itching and Rash    Itching and rash with adhesive tape   Latex Itching and Dermatitis    Skin blistering    Levocetirizine Dihydrochloride Other (See Comments)    Muscle jerks   Nexium [Esomeprazole]     MEDICATIONS: Current Outpatient Medications on File Prior to Visit  Medication Sig Dispense Refill   cyclobenzaprine (FLEXERIL) 5 MG tablet Take 1 tablet (5 mg total) by mouth 3 (three) times daily as needed for muscle spasms. 30 tablet 0   fluticasone (FLONASE) 50 MCG/ACT nasal spray instill 1 spray into each nostril twice a day  0   levonorgestrel (MIRENA) 20 MCG/24HR IUD 1 each by Intrauterine route once.     loratadine (CLARITIN) 10 MG tablet Take 10 mg by mouth daily.     olmesartan-hydrochlorothiazide (BENICAR HCT) 20-12.5 MG tablet Take 1 tablet by mouth daily. 90 tablet 0   Vitamin D, Ergocalciferol, (DRISDOL) 1.25 MG (50000 UT) CAPS capsule Take 1 capsule (50,000 Units total) by mouth 2 (two) times a week. 6 capsule 0   No current facility-administered medications on file prior to visit.     PAST MEDICAL HISTORY: Past Medical History:  Diagnosis Date   Abdominal wall seroma    Anemia    Arthritis    Asthma    B12 deficiency    Back pain    Carpal tunnel syndrome, bilateral    Chest pain    Colitis    Constipation    Dysmenorrhea    Dyspnea    Fatty liver    GERD (gastroesophageal reflux disease)    HLD (hyperlipidemia)    HPV (human papilloma virus) infection    WARTS/ TCA TX   Hypertension    Keratoconus    Leg edema    Obesity    Obesity    Osteoarthritis    Palpitations    Prediabetes    Seasonal allergies     PAST SURGICAL HISTORY: Past Surgical History:  Procedure Laterality Date   GYNECOLOGIC CRYOSURGERY  1993   DYSPLASIA CERVIX/    MYOMECTOMY  02/17/2011   Procedure: MYOMECTOMY;  Surgeon: Terrance Mass, MD;  Location: [facility];  Service: Gynecology;  Laterality: N/A;  I am hoping to get a 7:30am time on Dec 4, Dec 11 or Dec 12. It not available I will check on some 1:00 pm times. Thanks   MYOMECTOMY ABDOMINAL APPROACH   04/31/2007   OVARIAN CYST REMOVAL  02/17/2011   Procedure: OVARIAN CYSTECTOMY;  Surgeon: Terrance Mass, MD;  Location: [facility];  Service: Gynecology;  Laterality: Left;    SOCIAL HISTORY: Social History   Tobacco Use   Smoking status: Never Smoker   Smokeless tobacco: Never Used   Tobacco comment: denies tobacco   Substance Use Topics   Alcohol use: No    Alcohol/week: 0.0 standard drinks   Drug use: No    FAMILY HISTORY: Family History  Problem Relation Age of Onset   Hypertension Mother    Breast cancer Mother    Cancer Mother    Hyperlipidemia Mother    Thyroid disease Mother    Obesity Mother    Heart disease Maternal Grandmother    Cancer Father        LUNG   Breast cancer Maternal  Aunt    Diabetes Maternal Grandfather    Cancer Paternal Aunt    Ovarian cancer Paternal Aunt     ROS: Review of Systems  Constitutional: Negative for weight loss.  Gastrointestinal: Negative for diarrhea, nausea and vomiting.  Genitourinary: Negative for frequency.  Musculoskeletal:       Negative for muscle weakness  Endo/Heme/Allergies: Negative for polydipsia.       Negative for polyphagia    PHYSICAL EXAM: Blood pressure 132/84, pulse 88, temperature 98.5 F (36.9 C), temperature source Oral, height 5\' 3"  (1.6 m), weight 295 lb (133.8 kg), SpO2 97 %. Body mass index is 52.26 kg/m. Physical Exam Vitals signs reviewed.  Constitutional:      Appearance: Normal appearance. She is well-developed. She is obese.  Cardiovascular:     Rate and Rhythm: Normal rate.  Pulmonary:     Effort: Pulmonary effort is normal.  Musculoskeletal: Normal range of motion.  Skin:    General: Skin is warm and dry.  Neurological:     Mental Status: She is alert and oriented to person, place, and time.  Psychiatric:        Mood and Affect: Mood normal.        Behavior: Behavior normal.     RECENT LABS AND TESTS: BMET    Component Value Date/Time   NA 144 09/01/2018  0935   K 3.8 09/01/2018 0935   CL 102 09/01/2018 0935   CO2 26 09/01/2018 0935   GLUCOSE 103 (H) 09/01/2018 0935   GLUCOSE 99 02/22/2016 1206   BUN 12 09/01/2018 0935   CREATININE 0.65 09/01/2018 0935   CREATININE 0.75 02/22/2016 1206   CALCIUM 9.6 09/01/2018 0935   GFRNONAA 107 09/01/2018 0935   GFRAA 123 09/01/2018 0935   Lab Results  Component Value Date   HGBA1C 5.9 (H) 09/01/2018   HGBA1C 5.9 (H) 12/21/2017   HGBA1C 6.0 (H) 09/07/2017   HGBA1C 5.9 (H) 03/05/2017   HGBA1C 6.0 (H) 09/27/2016   Lab Results  Component Value Date   INSULIN 19.3 09/01/2018   INSULIN 32.3 (H) 12/21/2017   INSULIN 14.8 09/07/2017   CBC    Component Value Date/Time   WBC 12.0 (H) 12/21/2017 0936   WBC 9.3 02/22/2016 1206   RBC 5.17 12/21/2017 0936   RBC 4.84 02/22/2016 1206   HGB 13.7 12/21/2017 0936   HGB 11.5 (L) 02/14/2011 1218   HCT 42.0 12/21/2017 0936   HCT 35.4 02/14/2011 1218   PLT 417 (H) 03/05/2017 1754   MCV 81 12/21/2017 0936   MCV 83.2 02/14/2011 1218   MCH 26.5 (L) 12/21/2017 0936   MCH 26.4 (L) 02/22/2016 1206   MCHC 32.6 12/21/2017 0936   MCHC 31.7 (L) 02/22/2016 1206   RDW 13.4 12/21/2017 0936   RDW 15.2 (H) 02/14/2011 1218   LYMPHSABS 2.2 12/21/2017 0936   LYMPHSABS 1.7 02/14/2011 1218   MONOABS 651 02/22/2016 1206   MONOABS 0.8 02/14/2011 1218   EOSABS 0.1 12/21/2017 0936   BASOSABS 0.1 12/21/2017 0936   BASOSABS 0.1 02/14/2011 1218   Iron/TIBC/Ferritin/ %Sat No results found for: IRON, TIBC, FERRITIN, IRONPCTSAT Lipid Panel     Component Value Date/Time   CHOL 243 (H) 09/01/2018 0935   TRIG 186 (H) 09/01/2018 0935   HDL 52 09/01/2018 0935   CHOLHDL 4.6 (H) 03/05/2017 1754   CHOLHDL 4.8 02/22/2016 1206   VLDL 21 02/22/2016 1206   LDLCALC 154 (H) 09/01/2018 0935   LDLDIRECT 141.6 04/16/2010 0743   Hepatic Function  Panel     Component Value Date/Time   PROT 6.9 09/01/2018 0935   ALBUMIN 4.2 09/01/2018 0935   AST 40 09/01/2018 0935   ALT 63 (H)  09/01/2018 0935   ALKPHOS 122 (H) 09/01/2018 0935   BILITOT 0.4 09/01/2018 0935      Component Value Date/Time   TSH 3.190 09/07/2017 0917   TSH 2.190 03/05/2017 1754   TSH 1.72 02/22/2016 1206     Ref. Range 09/01/2018 09:35  Vitamin D, 25-Hydroxy Latest Ref Range: 30.0 - 100.0 ng/mL 14.2 (L)    OBESITY BEHAVIORAL INTERVENTION VISIT  Today's visit was # 22  Starting weight: 280 lbs Starting date: 09/07/2017 Today's weight : 295 lbs Today's date: 01/03/2019 Total lbs lost to date: 0    01/03/2019  Height 5\' 3"  (1.6 m)  Weight 295 lb (133.8 kg)  BMI (Calculated) 52.27  BLOOD PRESSURE - SYSTOLIC [id number]  BLOOD PRESSURE - DIASTOLIC 84   Body Fat % 0000000 %   ASK: We discussed the diagnosis of obesity with Fernande Bras today and Jawana agreed to give [place] permission to discuss obesity behavioral modification therapy today.  ASSESS: Aalijah has the diagnosis of obesity and her BMI today is 52.27 Lucella is in the action stage of change   ADVISE: Janayla was educated on the multiple health risks of obesity as well as the benefit of weight loss to improve her health. She was advised of the need for long term treatment and the importance of lifestyle modifications to improve her current health and to decrease her risk of future health problems.  AGREE: Multiple dietary modification options and treatment options were discussed and  Ermina agreed to follow the recommendations documented in the above note.  ARRANGE: Kassandre was educated on the importance of frequent visits to treat obesity as outlined per CMS and USPSTF guidelines and agreed to schedule her next follow up appointment today.  Corey Skains, am acting as transcriptionist for Abby Potash, PA-C I, Abby Potash, PA-C have reviewed above note and agree with its content

## 2019-01-07 ENCOUNTER — Other Ambulatory Visit (INDEPENDENT_AMBULATORY_CARE_PROVIDER_SITE_OTHER): Payer: Self-pay | Admitting: Physician Assistant

## 2019-01-07 DIAGNOSIS — I10 Essential (primary) hypertension: Secondary | ICD-10-CM

## 2019-01-15 ENCOUNTER — Other Ambulatory Visit (INDEPENDENT_AMBULATORY_CARE_PROVIDER_SITE_OTHER): Payer: Self-pay | Admitting: Physician Assistant

## 2019-01-15 ENCOUNTER — Other Ambulatory Visit: Payer: Self-pay

## 2019-01-15 ENCOUNTER — Ambulatory Visit (HOSPITAL_COMMUNITY)
Admission: EM | Admit: 2019-01-15 | Discharge: 2019-01-15 | Disposition: A | Discharge status: Other Acute Inpatient Hospital | Payer: Self-pay

## 2019-01-15 DIAGNOSIS — I10 Essential (primary) hypertension: Secondary | ICD-10-CM

## 2019-01-17 MED ORDER — OLMESARTAN MEDOXOMIL-HCTZ 20-12.5 MG PO TABS: 1.0000 | ORAL_TABLET | Freq: Every day | ORAL | 0 refills | Status: DC

## 2019-01-18 ENCOUNTER — Ambulatory Visit: Payer: Self-pay

## 2019-01-22 ENCOUNTER — Other Ambulatory Visit (INDEPENDENT_AMBULATORY_CARE_PROVIDER_SITE_OTHER): Payer: Self-pay | Admitting: Physician Assistant

## 2019-01-22 DIAGNOSIS — E559 Vitamin D deficiency, unspecified: Secondary | ICD-10-CM

## 2019-01-24 ENCOUNTER — Other Ambulatory Visit (INDEPENDENT_AMBULATORY_CARE_PROVIDER_SITE_OTHER): Payer: Self-pay

## 2019-01-31 ENCOUNTER — Encounter (INDEPENDENT_AMBULATORY_CARE_PROVIDER_SITE_OTHER): Payer: Self-pay | Admitting: Physician Assistant

## 2019-01-31 ENCOUNTER — Ambulatory Visit (INDEPENDENT_AMBULATORY_CARE_PROVIDER_SITE_OTHER): Payer: 59 | Admitting: Physician Assistant

## 2019-01-31 ENCOUNTER — Other Ambulatory Visit: Payer: Self-pay

## 2019-01-31 VITALS — BP 153/75 | HR 89 | Temp 97.7°F | Ht 63.0 in | Wt 296.0 lb

## 2019-01-31 DIAGNOSIS — E7849 Other hyperlipidemia: Secondary | ICD-10-CM

## 2019-01-31 DIAGNOSIS — M549 Dorsalgia, unspecified: Secondary | ICD-10-CM | POA: Diagnosis not present

## 2019-01-31 DIAGNOSIS — E559 Vitamin D deficiency, unspecified: Secondary | ICD-10-CM

## 2019-01-31 DIAGNOSIS — Z6841 Body Mass Index (BMI) 40.0 and over, adult: Secondary | ICD-10-CM

## 2019-01-31 DIAGNOSIS — Z9189 Other specified personal risk factors, not elsewhere classified: Secondary | ICD-10-CM | POA: Diagnosis not present

## 2019-01-31 MED ORDER — CYCLOBENZAPRINE HCL 5 MG PO TABS: 5.0000 mg | ORAL_TABLET | Freq: Three times a day (TID) | ORAL | 0 refills | Status: DC | PRN

## 2019-02-01 NOTE — Progress Notes (Signed)
Office: [phone]  /  Fax: [phone]   HPI:  Chief Complaint: OBESITY Mindy Collins is here to discuss her progress with her obesity treatment plan. She is on the Portion Control make smarter food choices plan and states she is following her eating plan approximately 50 % of the time. She states she is exercising 0 minutes 0 times per week.  Mindy Collins states that she did not do well with the portion control, make smarter food choices plan and she feels that she needs structure in the plan.  Hyperlipidemia Mindy Collins has hyperlipidemia and she is not on medications. She has no chest pain. Mindy Collins is not currently exercising.     Vitamin D deficiency Mindy Collins has a diagnosis of vitamin D deficiency. Mindy Collins is on vitamin D and she denies nausea, vomiting or muscle weakness.  Back Pain Mindy Collins has back pain and she is on Cyclobenzaprine and Meloxicam. She is experiencing increased back pain.  At risk for cardiovascular disease Mindy Collins is at a higher than average risk for cardiovascular disease due to obesity and hyperlipidemia. She currently denies any chest pain.   Today's visit was # 23 Starting weight: 280 lbs Starting date: 09/07/2017 Today's weight : 296 lbs Today's date: 01/31/2019 Total lbs lost to date: 0 Total lbs lost since last in-office visit: 0  ASSESSMENT AND PLAN:  Other hyperlipidemia  Vitamin D deficiency  Back pain, unspecified back location, unspecified back pain laterality, unspecified chronicity - Plan: cyclobenzaprine (FLEXERIL) 5 MG tablet  At risk for heart disease  Class 3 severe obesity with serious comorbidity and body mass index (BMI) of 50.0 to 59.9 in adult, unspecified obesity type ([place])  PLAN:  Hyperlipidemia Intensive lifestyle modifications as the first line treatment for hyperlipidemia. We discussed many lifestyle modifications today and Mindy Collins will continue to work on diet, exercise and weight loss efforts.  Vitamin D Deficiency Low vitamin D level  contributes to fatigue and are associated with obesity, breast, and colon cancer. Mindy Collins will continue to take prescription Vit D @50 ,000 IU twice weekly and she will follow up for routine testing of vitamin D, at least 2-3 times per year to avoid over-replacement.  Back Pain Mindy Collins agrees to continue Cyclobenzaprine 5 mg #30 with no refills and follow up as directed.   Cardiovascular risk counseling Mindy Collins was given (~15 minutes) coronary artery disease prevention counseling today. She is 48 y.o. female and has risk factors for heart disease including obesity and hyperlipidemia. We discussed intensive lifestyle modifications today with an emphasis on specific weight loss instructions and strategies.   Obesity Mindy Collins is currently in the action stage of change. As such, her goal is to continue with weight loss efforts She has agreed to follow the Category 3 plan. Mindy Collins has been instructed to work up to a goal of 150 minutes of combined cardio and strengthening exercise per week for weight loss and overall health benefits. We discussed the following Behavioral Modification Strategies today: no skipping meals and work on meal planning and easy cooking plans.  Mindy Collins has agreed to follow up with our clinic in 3 weeks. She was informed of the importance of frequent follow up visits to maximize her success with intensive lifestyle modifications for her multiple health conditions.  ALLERGIES: Allergies  Allergen Reactions  . Esomeprazole Magnesium Shortness Of Breath    Nervousness; tolerates Aciphex.  . Adhesive [Tape] Itching and Rash    Itching and rash with adhesive tape  . Latex Itching and Dermatitis    Skin blistering  .  Levocetirizine Dihydrochloride Other (See Comments)    Muscle jerks  . Nexium [Esomeprazole]     MEDICATIONS: Current Outpatient Medications on File Prior to Visit  Medication Sig Dispense Refill  . fluticasone (FLONASE) 50 MCG/ACT nasal spray instill 1 spray into each  nostril twice a day  0  . levonorgestrel (MIRENA) 20 MCG/24HR IUD 1 each by Intrauterine route once.    . loratadine (CLARITIN) 10 MG tablet Take 10 mg by mouth daily.    . meloxicam (MOBIC) 15 MG tablet TAKE 1 TABLET BY MOUTH DAILY AS NEEDED 30 tablet 0  . metFORMIN (GLUCOPHAGE) 500 MG tablet Take 1 tablet (500 mg total) by mouth daily with breakfast. 30 tablet 0  . olmesartan-hydrochlorothiazide (BENICAR HCT) 20-12.5 MG tablet Take 1 tablet by mouth daily. 90 tablet 0  . Vitamin D, Ergocalciferol, (DRISDOL) 1.25 MG (50000 UT) CAPS capsule Take 1 capsule (50,000 Units total) by mouth 2 (two) times a week. 6 capsule 0   No current facility-administered medications on file prior to visit.    PAST MEDICAL HISTORY: Past Medical History:  Diagnosis Date  . Abdominal wall seroma   . Anemia   . Arthritis   . Asthma   . B12 deficiency   . Back pain   . Carpal tunnel syndrome, bilateral   . Chest pain   . Colitis   . Constipation   . Dysmenorrhea   . Dyspnea   . Fatty liver   . GERD (gastroesophageal reflux disease)   . HLD (hyperlipidemia)   . HPV (human papilloma virus) infection    WARTS/ TCA [place]  . Hypertension   . Keratoconus   . Leg edema   . Obesity   . Obesity   . Osteoarthritis   . Palpitations   . Prediabetes   . Seasonal allergies     PAST SURGICAL HISTORY: Past Surgical History:  Procedure Laterality Date  . GYNECOLOGIC CRYOSURGERY  1993   DYSPLASIA CERVIX/   . MYOMECTOMY  02/17/2011   Procedure: MYOMECTOMY;  Surgeon: Terrance Mass, MD;  Location: [facility];  Service: Gynecology;  Laterality: N/A;  I am hoping to get a 7:30am time on Dec 4, Dec 11 or Dec 12. It not available I will check on some 1:00 pm times. Thanks  . MYOMECTOMY ABDOMINAL APPROACH  04/31/2007  . OVARIAN CYST REMOVAL  02/17/2011   Procedure: OVARIAN CYSTECTOMY;  Surgeon: Terrance Mass, MD;  Location: [facility];  Service: Gynecology;  Laterality: Left;    SOCIAL HISTORY: Social History    Tobacco Use  . Smoking status: Never Smoker  . Smokeless tobacco: Never Used  . Tobacco comment: denies tobacco   Substance Use Topics  . Alcohol use: No    Alcohol/week: 0.0 standard drinks  . Drug use: No    FAMILY HISTORY: Family History  Problem Relation Age of Onset  . Hypertension Mother   . Breast cancer Mother   . Cancer Mother   . Hyperlipidemia Mother   . Thyroid disease Mother   . Obesity Mother   . Heart disease Maternal Grandmother   . Cancer Father        LUNG  . Breast cancer Maternal Aunt   . Diabetes Maternal Grandfather   . Cancer Paternal Aunt   . Ovarian cancer Paternal Aunt     ROS: Review of Systems  Constitutional: Negative for weight loss.  Cardiovascular: Negative for chest pain.  Gastrointestinal: Negative for nausea and vomiting.  Musculoskeletal: Positive for back pain.  Negative for muscle weakness    PHYSICAL EXAM: Blood pressure (!) 153/75, pulse 89, temperature 97.7 F (36.5 C), temperature source Oral, height 5\' 3"  (1.6 m), weight 296 lb (134.3 kg), SpO2 100 %. Body mass index is 52.43 kg/m. Physical Exam Vitals reviewed.  Constitutional:      General: She is not in acute distress.    Appearance: Normal appearance. She is well-developed. She is obese.  Cardiovascular:     Rate and Rhythm: Normal rate.  Pulmonary:     Effort: Pulmonary effort is normal.  Musculoskeletal:        General: Normal range of motion.  Skin:    General: Skin is warm and dry.  Neurological:     Mental Status: She is alert and oriented to person, place, and time.  Psychiatric:        Mood and Affect: Mood normal.        Behavior: Behavior normal.     RECENT LABS AND TESTS: BMET    Component Value Date/Time   NA 144 09/01/2018 0935   K 3.8 09/01/2018 0935   CL 102 09/01/2018 0935   CO2 26 09/01/2018 0935   GLUCOSE 103 (H) 09/01/2018 0935   GLUCOSE 99 02/22/2016 1206   BUN 12 09/01/2018 0935   CREATININE 0.65 09/01/2018 0935    CREATININE 0.75 02/22/2016 1206   CALCIUM 9.6 09/01/2018 0935   GFRNONAA 107 09/01/2018 0935   GFRAA 123 09/01/2018 0935   Lab Results  Component Value Date   HGBA1C 5.9 (H) 09/01/2018   HGBA1C 5.9 (H) 12/21/2017   HGBA1C 6.0 (H) 09/07/2017   HGBA1C 5.9 (H) 03/05/2017   HGBA1C 6.0 (H) 09/27/2016   Lab Results  Component Value Date   INSULIN 19.3 09/01/2018   INSULIN 32.3 (H) 12/21/2017   INSULIN 14.8 09/07/2017   CBC    Component Value Date/Time   WBC 12.0 (H) 12/21/2017 0936   WBC 9.3 02/22/2016 1206   RBC 5.17 12/21/2017 0936   RBC 4.84 02/22/2016 1206   HGB 13.7 12/21/2017 0936   HGB 11.5 (L) 02/14/2011 1218   HCT 42.0 12/21/2017 0936   HCT 35.4 02/14/2011 1218   PLT 417 (H) 03/05/2017 1754   MCV 81 12/21/2017 0936   MCV 83.2 02/14/2011 1218   MCH 26.5 (L) 12/21/2017 0936   MCH 26.4 (L) 02/22/2016 1206   MCHC 32.6 12/21/2017 0936   MCHC 31.7 (L) 02/22/2016 1206   RDW 13.4 12/21/2017 0936   RDW 15.2 (H) 02/14/2011 1218   LYMPHSABS 2.2 12/21/2017 0936   LYMPHSABS 1.7 02/14/2011 1218   MONOABS 651 02/22/2016 1206   MONOABS 0.8 02/14/2011 1218   EOSABS 0.1 12/21/2017 0936   BASOSABS 0.1 12/21/2017 0936   BASOSABS 0.1 02/14/2011 1218   Iron/TIBC/Ferritin/ %Sat No results found for: IRON, TIBC, FERRITIN, IRONPCTSAT Lipid Panel     Component Value Date/Time   CHOL 243 (H) 09/01/2018 0935   TRIG 186 (H) 09/01/2018 0935   HDL 52 09/01/2018 0935   CHOLHDL 4.6 (H) 03/05/2017 1754   CHOLHDL 4.8 02/22/2016 1206   VLDL 21 02/22/2016 1206   LDLCALC 154 (H) 09/01/2018 0935   LDLDIRECT 141.6 04/16/2010 0743   Hepatic Function Panel     Component Value Date/Time   PROT 6.9 09/01/2018 0935   ALBUMIN 4.2 09/01/2018 0935   AST 40 09/01/2018 0935   ALT 63 (H) 09/01/2018 0935   ALKPHOS 122 (H) 09/01/2018 0935   BILITOT 0.4 09/01/2018 0935  Component Value Date/Time   TSH 3.190 09/07/2017 0917   TSH 2.190 03/05/2017 1754   TSH 1.72 02/22/2016 1206     Ref.  Range 09/01/2018 09:35  Vitamin D, 25-Hydroxy Latest Ref Range: 30.0 - 100.0 ng/mL 14.2 (L)    I, Doreene Nest, am acting as transcriptionist for Abby Potash, PA-C I, Abby Potash, PA-C have reviewed above note and agree with its content

## 2019-02-03 ENCOUNTER — Other Ambulatory Visit (INDEPENDENT_AMBULATORY_CARE_PROVIDER_SITE_OTHER): Payer: Self-pay | Admitting: Physician Assistant

## 2019-02-03 ENCOUNTER — Other Ambulatory Visit: Payer: Self-pay | Admitting: Registered Nurse

## 2019-02-03 DIAGNOSIS — M25511 Pain in right shoulder: Secondary | ICD-10-CM

## 2019-02-03 DIAGNOSIS — R7303 Prediabetes: Secondary | ICD-10-CM

## 2019-02-10 ENCOUNTER — Other Ambulatory Visit (INDEPENDENT_AMBULATORY_CARE_PROVIDER_SITE_OTHER): Payer: Self-pay | Admitting: Physician Assistant

## 2019-02-10 DIAGNOSIS — E559 Vitamin D deficiency, unspecified: Secondary | ICD-10-CM

## 2019-02-23 ENCOUNTER — Encounter (INDEPENDENT_AMBULATORY_CARE_PROVIDER_SITE_OTHER): Payer: Self-pay | Admitting: Physician Assistant

## 2019-02-23 ENCOUNTER — Ambulatory Visit (INDEPENDENT_AMBULATORY_CARE_PROVIDER_SITE_OTHER): Payer: 59 | Admitting: Physician Assistant

## 2019-02-23 ENCOUNTER — Other Ambulatory Visit: Payer: Self-pay

## 2019-02-23 VITALS — BP 123/82 | HR 81 | Temp 98.9°F | Ht 63.0 in | Wt 297.0 lb

## 2019-02-23 DIAGNOSIS — E559 Vitamin D deficiency, unspecified: Secondary | ICD-10-CM

## 2019-02-23 DIAGNOSIS — E7849 Other hyperlipidemia: Secondary | ICD-10-CM

## 2019-02-23 DIAGNOSIS — R7303 Prediabetes: Secondary | ICD-10-CM | POA: Diagnosis not present

## 2019-02-23 DIAGNOSIS — Z6841 Body Mass Index (BMI) 40.0 and over, adult: Secondary | ICD-10-CM

## 2019-02-23 DIAGNOSIS — Z9189 Other specified personal risk factors, not elsewhere classified: Secondary | ICD-10-CM | POA: Diagnosis not present

## 2019-02-24 ENCOUNTER — Encounter: Payer: 59 | Admitting: Obstetrics & Gynecology

## 2019-02-24 LAB — COMPREHENSIVE METABOLIC PANEL
ALT: 77 IU/L — ABNORMAL HIGH (ref 0–32)
AST: 52 IU/L — ABNORMAL HIGH (ref 0–40)
Albumin/Globulin Ratio: 1.4 (ref 1.2–2.2)
Albumin: 4 g/dL (ref 3.8–4.8)
Alkaline Phosphatase: 138 IU/L — ABNORMAL HIGH (ref 39–117)
BUN/Creatinine Ratio: 15 (ref 9–23)
BUN: 12 mg/dL (ref 6–24)
Bilirubin Total: 0.3 mg/dL (ref 0.0–1.2)
CO2: 31 mmol/L — ABNORMAL HIGH (ref 20–29)
Calcium: 9.7 mg/dL (ref 8.7–10.2)
Chloride: 102 mmol/L (ref 96–106)
Creatinine, Ser: 0.78 mg/dL (ref 0.57–1.00)
GFR calc Af Amer: 105 mL/min/{1.73_m2} (ref >59)
GFR calc non Af Amer: 91 mL/min/{1.73_m2} (ref >59)
Globulin, Total: 2.8 g/dL (ref 1.5–4.5)
Glucose: 98 mg/dL (ref 65–99)
Potassium: 4.5 mmol/L (ref 3.5–5.2)
Sodium: 143 mmol/L (ref 134–144)
Total Protein: 6.8 g/dL (ref 6.0–8.5)

## 2019-02-24 LAB — VITAMIN D 25 HYDROXY (VIT D DEFICIENCY, FRACTURES): Vit D, 25-Hydroxy: 19.7 ng/mL — ABNORMAL LOW (ref 30.0–100.0)

## 2019-02-24 LAB — LIPID PANEL WITH LDL/HDL RATIO
Cholesterol, Total: 239 mg/dL — ABNORMAL HIGH (ref 100–199)
HDL: 43 mg/dL (ref >39)
LDL Chol Calc (NIH): 157 mg/dL — ABNORMAL HIGH (ref 0–99)
LDL/HDL Ratio: 3.7 ratio — ABNORMAL HIGH (ref 0.0–3.2)
Triglycerides: 211 mg/dL — ABNORMAL HIGH (ref 0–149)
VLDL Cholesterol Cal: 39 mg/dL (ref 5–40)

## 2019-02-24 LAB — HEMOGLOBIN A1C
Est. average glucose Bld gHb Est-mCnc: 120 mg/dL
Hgb A1c MFr Bld: 5.8 % — ABNORMAL HIGH (ref 4.8–5.6)

## 2019-02-24 LAB — INSULIN, RANDOM: INSULIN: 3.4 u[IU]/mL (ref 2.6–24.9)

## 2019-02-24 NOTE — Progress Notes (Signed)
Chief Complaint:   OBESITY Mindy Collins is here to discuss her progress with her obesity treatment plan along with follow-up of her obesity related diagnoses. Mindy Collins is on the Category 3 Plan and states she is following her eating plan approximately 40% of the time. Mindy Collins states she is not exercising.   Today's visit was #: 23 Starting weight: 280 lbs Starting date: 01/31/19 Today's weight: 297 lbs Today's date: 02/23/2019 Total lbs lost to date: 0 Total lbs lost since last in-office visit: 0  Interim History: Mindy Collins reports that she is following the plan during the day but is up late at night and starts snacking. Her schedule varies from day to day and she sometimes misses a meal.   Subjective:   1. Other hyperlipidemia Mindy Collins has hyperlipidemia and has been trying to improve her cholesterol levels with intensive lifestyle modification including a low saturated fat diet, exercise and weight loss. She denies any chest pain, claudication or myalgias. She is due for labs.   Lab Results  Component Value Date   ALT 63 (H) 09/01/2018   AST 40 09/01/2018   ALKPHOS 122 (H) 09/01/2018   BILITOT 0.4 09/01/2018   Lab Results  Component Value Date   CHOL 243 (H) 09/01/2018   HDL 52 09/01/2018   LDLCALC 154 (H) 09/01/2018   LDLDIRECT 141.6 04/16/2010   TRIG 186 (H) 09/01/2018   CHOLHDL 4.6 (H) 03/05/2017    2. Vitamin D deficiency She's Vitamin D level was 14.2 on 03/03/18. She is currently taking vit D 2 times a week. She denies nausea, vomiting or muscle weakness. She is due for labs.   3. Prediabetes Mindy Collins has a diagnosis of prediabetes based on her elevated HgA1c and was informed this puts her at greater risk of developing diabetes. Her last HgA1c was 5.9. She is currently taking Metformin. She continues to work on diet and exercise to decrease her risk of diabetes. She denies nausea, vomiting, diarrhea, or hypoglycemia. She is due for labs.   4. At risk for heart disease Mindy Collins  is at a higher than average risk for cardiovascular disease due to obesity. Reviewed: no chest pain on exertion, no dyspnea on exertion, and no swelling of ankles.  Assessment/Plan:   1. Other hyperlipidemia Cardiovascular risk and specific lipid/LDL goals reviewed.  We discussed several lifestyle modifications today and Mindy Collins will continue to work on diet, exercise and weight loss efforts. Orders and follow up as documented in patient record.   Counseling Intensive lifestyle modifications are the first line treatment for this issue. . Dietary changes: Increase soluble fiber. Decrease simple carbohydrates. . Exercise changes: Moderate to vigorous-intensity aerobic activity 150 minutes per week if tolerated. . Lipid-lowering medications: see documented in medical record. - Lipid Panel With LDL/HDL Ratio  2. Vitamin D deficiency Low Vitamin D level contributes to fatigue and are associated with obesity, breast, and colon cancer. She agrees to continue to take prescription Vitamin D @50 ,000 IU every week and will follow-up for routine testing of vitamin D, at least 2-3 times per year to avoid over-replacement. - Vitamin D (25 hydroxy)  3. Prediabetes Mindy Collins will continue to work on weight loss, exercise, and decreasing simple carbohydrates to help decrease the risk of diabetes.  - HgB A1c - Insulin, random - Comprehensive Metabolic Panel (CMET)  4. At risk for heart disease Mindy Collins was given approximately 15 minutes of coronary artery disease prevention counseling today. She is 49 y.o. female and has risk factors for heart  disease including obesity. We discussed intensive lifestyle modifications today with an emphasis on specific weight loss instructions and strategies.   5. Class 3 severe obesity with serious comorbidity and body mass index (BMI) of 50.0 to 59.9 in adult, unspecified obesity type (HCC) Mindy Collins is currently in the action stage of change. As such, her goal is to continue with  weight loss efforts. She has agreed to on the Category 3 Plan.   We discussed the following exercise goals today: For substantial health benefits, adults should do at least 150 minutes (2 hours and 30 minutes) a week of moderate-intensity, or 75 minutes (1 hour and 15 minutes) a week of vigorous-intensity aerobic physical activity, or an equivalent combination of moderate- and vigorous-intensity aerobic activity. Aerobic activity should be performed in episodes of at least 10 minutes, and preferably, it should be spread throughout the week. Adults should also include muscle-strengthening activities that involve all major muscle groups on 2 or more days a week.  We discussed the following behavioral modification strategies today: meal planning and cooking strategies and keeping healthy foods in the home.  Mindy Collins has agreed to follow-up with our clinic in 3 weeks. She was informed of the importance of frequent follow-up visits to maximize her success with intensive lifestyle modifications for her multiple health conditions.   Mindy Collins was informed we would discuss her lab results at her next visit unless there is a critical issue that needs to be addressed sooner. Mindy Collins agreed to keep her next visit at the agreed upon time to discuss these results.  Objective:   Blood pressure 123/82, pulse 81, temperature 98.9 F (37.2 C), temperature source Oral, height 5\' 3"  (1.6 m), weight 297 lb (134.7 kg), SpO2 99 %. Body mass index is 52.61 kg/m.  General: Cooperative, alert, well developed, in no acute distress. HEENT: Conjunctivae and lids unremarkable. Neck: No thyromegaly.  Cardiovascular: Regular rhythm.  Lungs: Normal work of breathing. Extremities: No edema.  Neurologic: No focal deficits.   Lab Results  Component Value Date   CREATININE 0.65 09/01/2018   BUN 12 09/01/2018   NA 144 09/01/2018   K 3.8 09/01/2018   CL 102 09/01/2018   CO2 26 09/01/2018   Lab Results  Component Value  Date   ALT 63 (H) 09/01/2018   AST 40 09/01/2018   ALKPHOS 122 (H) 09/01/2018   BILITOT 0.4 09/01/2018   Lab Results  Component Value Date   HGBA1C 5.9 (H) 09/01/2018   HGBA1C 5.9 (H) 12/21/2017   HGBA1C 6.0 (H) 09/07/2017   HGBA1C 5.9 (H) 03/05/2017   HGBA1C 6.0 (H) 09/27/2016   Lab Results  Component Value Date   INSULIN 19.3 09/01/2018   INSULIN 32.3 (H) 12/21/2017   INSULIN 14.8 09/07/2017   Lab Results  Component Value Date   TSH 3.190 09/07/2017   Lab Results  Component Value Date   CHOL 243 (H) 09/01/2018   HDL 52 09/01/2018   LDLCALC 154 (H) 09/01/2018   LDLDIRECT 141.6 04/16/2010   TRIG 186 (H) 09/01/2018   CHOLHDL 4.6 (H) 03/05/2017   Lab Results  Component Value Date   WBC 12.0 (H) 12/21/2017   HGB 13.7 12/21/2017   HCT 42.0 12/21/2017   MCV 81 12/21/2017   PLT 417 (H) 03/05/2017    Attestation Statements:   Reviewed by clinician on day of visit: allergies, medications, problem list, medical history, surgical history, family history, social history, and previous encounter notes.  Leary Roca, am acting as transcriptionist for  Abby Potash, PA-C.  I have reviewed the above documentation for accuracy and completeness, and I agree with the above. Abby Potash, PA-C

## 2019-02-28 ENCOUNTER — Encounter: Payer: Self-pay | Admitting: Adult Health

## 2019-03-01 ENCOUNTER — Telehealth (INDEPENDENT_AMBULATORY_CARE_PROVIDER_SITE_OTHER): Payer: 59 | Admitting: Adult Health

## 2019-03-01 DIAGNOSIS — G4733 Obstructive sleep apnea (adult) (pediatric): Secondary | ICD-10-CM | POA: Diagnosis not present

## 2019-03-01 DIAGNOSIS — Z9989 Dependence on other enabling machines and devices: Secondary | ICD-10-CM

## 2019-03-01 NOTE — Progress Notes (Signed)
PATIENT: Mindy Collins DOB: 11/14/1970  REASON FOR VISIT: follow up HISTORY FROM: patient  HISTORY OF PRESENT ILLNESS: Today 03/01/19:  Mindy Collins is a 49 year old female with a history of obstructive sleep apnea on CPAP.  She returns today for follow-up.  Her download indicates that she use her machine 17 out of 30 days for compliance of 57%.  She used her machine greater than 4 hours 16 days for compliance of 53%.  On average she uses her machine 7 hours and 1 minute.  She reports that she has been working long hours for the last 2 months.  She states that the nights that she goes to bed extremely late she often will not put on the CPAP because she feels that she may oversleep.  When she is using the CPAP her residual AHI is 1.9 on 6 to 18 cm of water with EPR of 3.  Her leak in the 95th percentile is 32.8 L/min.  She states that she does feel the CPAP leaking at times.  She reports that she has gained some weight and is curious if the mask is still the right size.  Overall she reports that she does plan to get more consistent with using the CPAP.  She states that in the next 2 weeks her work hours should minimize and she should be able to use it more consistently.  She returns today for an evaluation.  HISTORY 08/11/18:  Mindy Collins is a 49 year old female with a history of obstructive sleep apnea on CPAP.  She returns today for follow-up.  Her CPAP download indicates that she use her machine 30 out of 30 days for compliance of 100%.  She used her machine 27 out of 30 days for compliance of 90%.  On average she uses her machine 7 hours and 11 minutes.  Her residual AHI is 1.7 on 6 to 18 cm of water with EPR 3.  Her leak in the 95th percentile is 13.3.  She states that the CPAP has worked well for her.  She states that if she does not use it all night she notices the difference the next day.  She denies any new issues.  She joins me today for follow-up.   REVIEW OF SYSTEMS: Out of a complete 14  system review of symptoms, the patient complains only of the following symptoms, and all other reviewed systems are negative.  See HPI  ALLERGIES: Allergies  Allergen Reactions  . Esomeprazole Magnesium Shortness Of Breath    Nervousness; tolerates Aciphex.  . Adhesive [Tape] Itching and Rash    Itching and rash with adhesive tape  . Latex Itching and Dermatitis    Skin blistering  . Levocetirizine Dihydrochloride Other (See Comments)    Muscle jerks  . Nexium [Esomeprazole]     HOME MEDICATIONS: Outpatient Medications Prior to Visit  Medication Sig Dispense Refill  . cyclobenzaprine (FLEXERIL) 5 MG tablet Take 1 tablet (5 mg total) by mouth 3 (three) times daily as needed for muscle spasms. 30 tablet 0  . fluticasone (FLONASE) 50 MCG/ACT nasal spray instill 1 spray into each nostril twice a day  0  . levonorgestrel (MIRENA) 20 MCG/24HR IUD 1 each by Intrauterine route once.    . loratadine (CLARITIN) 10 MG tablet Take 10 mg by mouth daily.    . meloxicam (MOBIC) 15 MG tablet TAKE 1 TABLET BY MOUTH DAILY AS NEEDED 30 tablet 0  . metFORMIN (GLUCOPHAGE) 500 MG tablet Take 1 tablet (500 mg  total) by mouth daily with breakfast. 30 tablet 0  . olmesartan-hydrochlorothiazide (BENICAR HCT) 20-12.5 MG tablet Take 1 tablet by mouth daily. 90 tablet 0  . Vitamin D, Ergocalciferol, (DRISDOL) 1.25 MG (50000 UT) CAPS capsule Take 1 capsule (50,000 Units total) by mouth 2 (two) times a week. 6 capsule 0   No facility-administered medications prior to visit.    PAST MEDICAL HISTORY: Past Medical History:  Diagnosis Date  . Abdominal wall seroma   . Anemia   . Arthritis   . Asthma   . B12 deficiency   . Back pain   . Carpal tunnel syndrome, bilateral   . Chest pain   . Colitis   . Constipation   . Dysmenorrhea   . Dyspnea   . Fatty liver   . GERD (gastroesophageal reflux disease)   . HLD (hyperlipidemia)   . HPV (human papilloma virus) infection    WARTS/ TCA [place]  . Hypertension     . Keratoconus   . Leg edema   . Obesity   . Obesity   . Osteoarthritis   . Palpitations   . Prediabetes   . Seasonal allergies     PAST SURGICAL HISTORY: Past Surgical History:  Procedure Laterality Date  . GYNECOLOGIC CRYOSURGERY  1993   DYSPLASIA CERVIX/   . MYOMECTOMY  02/17/2011   Procedure: MYOMECTOMY;  Surgeon: Terrance Mass, MD;  Location: [facility];  Service: Gynecology;  Laterality: N/A;  I am hoping to get a 7:30am time on Dec 4, Dec 11 or Dec 12. It not available I will check on some 1:00 pm times. Thanks  . MYOMECTOMY ABDOMINAL APPROACH  04/31/2007  . OVARIAN CYST REMOVAL  02/17/2011   Procedure: OVARIAN CYSTECTOMY;  Surgeon: Terrance Mass, MD;  Location: [facility];  Service: Gynecology;  Laterality: Left;    FAMILY HISTORY: Family History  Problem Relation Age of Onset  . Hypertension Mother   . Breast cancer Mother   . Cancer Mother   . Hyperlipidemia Mother   . Thyroid disease Mother   . Obesity Mother   . Heart disease Maternal Grandmother   . Cancer Father        LUNG  . Breast cancer Maternal Aunt   . Diabetes Maternal Grandfather   . Cancer Paternal Aunt   . Ovarian cancer Paternal Aunt     SOCIAL HISTORY: Social History   Socioeconomic History  . Marital status: Single    Spouse name: Not on file  . Number of children: 0  . Years of education: Not on file  . Highest education level: Not on file  Occupational History  . Occupation: Sport and exercise psychologist, Environmental consultant college, admin    Employer: [employer]    Comment: [employer]  Tobacco Use  . Smoking status: Never Smoker  . Smokeless tobacco: Never Used  . Tobacco comment: denies tobacco   Substance and Sexual Activity  . Alcohol use: No    Alcohol/week: 0.0 standard drinks  . Drug use: No  . Sexual activity: Yes    Birth control/protection: I.U.D.  Other Topics Concern  . Not on file  Social History Narrative   Graduate degree from [employer] in woman/gender studies.   Lives with her mother.    Social Determinants of Health   Financial Resource Strain:   . Difficulty of Paying Living Expenses: Not on file  Food Insecurity:   . Worried About Charity fundraiser in the Last Year: Not on file  . Ran Out of  Food in the Last Year: Not on file  Transportation Needs:   . Lack of Transportation (Medical): Not on file  . Lack of Transportation (Non-Medical): Not on file  Physical Activity:   . Days of Exercise per Week: Not on file  . Minutes of Exercise per Session: Not on file  Stress:   . Feeling of Stress : Not on file  Social Connections:   . Frequency of Communication with Friends and Family: Not on file  . Frequency of Social Gatherings with Friends and Family: Not on file  . Attends Religious Services: Not on file  . Active Member of Clubs or Organizations: Not on file  . Attends Archivist Meetings: Not on file  . Marital Status: Not on file  Intimate Partner Violence:   . Fear of Current or Ex-Partner: Not on file  . Emotionally Abused: Not on file  . Physically Abused: Not on file  . Sexually Abused: Not on file      PHYSICAL EXAM  There were no vitals filed for this visit. There is no height or weight on file to calculate BMI.  Generalized: Well developed, in no acute distress   Neurological examination  Mentation: Alert oriented to time, place, history taking. Follows all commands speech and language fluent Cranial nerve II-XII: Pupils were equal round reactive to light. Extraocular movements were full, visual field were full on confrontational test.  Head turning and shoulder shrug  were normal and symmetric. Motor: The motor testing reveals 5 over 5 strength of all 4 extremities. Good symmetric motor tone is noted throughout.  Sensory: Sensory testing is intact to soft touch on all 4 extremities. No evidence of extinction is noted.  Coordination: Cerebellar testing reveals good finger-nose-finger and heel-to-shin bilaterally.  Gait and  station: Gait is normal. Reflexes: Deep tendon reflexes are symmetric and normal bilaterally.   DIAGNOSTIC DATA (LABS, IMAGING, TESTING) - I reviewed patient records, labs, notes, testing and imaging myself where available.  Lab Results  Component Value Date   WBC 12.0 (H) 12/21/2017   HGB 13.7 12/21/2017   HCT 42.0 12/21/2017   MCV 81 12/21/2017   PLT 417 (H) 03/05/2017      Component Value Date/Time   NA 143 02/23/2019 1129   K 4.5 02/23/2019 1129   CL 102 02/23/2019 1129   CO2 31 (H) 02/23/2019 1129   GLUCOSE 98 02/23/2019 1129   GLUCOSE 99 02/22/2016 1206   BUN 12 02/23/2019 1129   CREATININE 0.78 02/23/2019 1129   CREATININE 0.75 02/22/2016 1206   CALCIUM 9.7 02/23/2019 1129   PROT 6.8 02/23/2019 1129   ALBUMIN 4.0 02/23/2019 1129   AST 52 (H) 02/23/2019 1129   ALT 77 (H) 02/23/2019 1129   ALKPHOS 138 (H) 02/23/2019 1129   BILITOT 0.3 02/23/2019 1129   GFRNONAA 91 02/23/2019 1129   GFRAA 105 02/23/2019 1129   Lab Results  Component Value Date   CHOL 239 (H) 02/23/2019   HDL 43 02/23/2019   LDLCALC 157 (H) 02/23/2019   LDLDIRECT 141.6 04/16/2010   TRIG 211 (H) 02/23/2019   CHOLHDL 4.6 (H) 03/05/2017   Lab Results  Component Value Date   HGBA1C 5.8 (H) 02/23/2019   Lab Results  Component Value Date   VITAMINB12 989 09/07/2017   Lab Results  Component Value Date   TSH 3.190 09/07/2017      ASSESSMENT AND PLAN 49 y.o. year old female  has a past medical history of Abdominal wall seroma, Anemia,  Arthritis, Asthma, B12 deficiency, Back pain, Carpal tunnel syndrome, bilateral, Chest pain, Colitis, Constipation, Dysmenorrhea, Dyspnea, Fatty liver, GERD (gastroesophageal reflux disease), HLD (hyperlipidemia), HPV (human papilloma virus) infection, Hypertension, Keratoconus, Leg edema, Obesity, Obesity, Osteoarthritis, Palpitations, Prediabetes, and Seasonal allergies. here with :  1. Obstructive sleep apnea on CPAP  Patient CPAP download shows suboptimal  compliance.  Her apnea is well treated when she uses the CPAP.  An order will be sent to her DME company for mask refitting.  She is encouraged to continue using CPAP nightly and greater than 4 hours each night.  She is advised that if her symptoms worsen or she develops new symptoms she should let us know.  She will follow-up in 1 year or sooner if needed   I spent 15 minutes with the patient. 50% of this time was spent reviewing CPAP download   Ward Givens, MSN, NP-C 03/01/2019, 1:31 PM [facility] [address] [place], [place] [address] [phone]

## 2019-03-02 ENCOUNTER — Ambulatory Visit: Payer: Self-pay

## 2019-03-09 ENCOUNTER — Other Ambulatory Visit: Payer: Self-pay | Admitting: Registered Nurse

## 2019-03-09 DIAGNOSIS — M25511 Pain in right shoulder: Secondary | ICD-10-CM

## 2019-03-09 NOTE — Telephone Encounter (Signed)
Your patient 

## 2019-03-16 ENCOUNTER — Encounter (INDEPENDENT_AMBULATORY_CARE_PROVIDER_SITE_OTHER): Payer: Self-pay | Admitting: Physician Assistant

## 2019-03-16 ENCOUNTER — Ambulatory Visit (INDEPENDENT_AMBULATORY_CARE_PROVIDER_SITE_OTHER): Payer: 59 | Admitting: Physician Assistant

## 2019-03-16 ENCOUNTER — Encounter (INDEPENDENT_AMBULATORY_CARE_PROVIDER_SITE_OTHER): Payer: Self-pay | Admitting: Family Medicine

## 2019-03-16 ENCOUNTER — Telehealth (INDEPENDENT_AMBULATORY_CARE_PROVIDER_SITE_OTHER): Payer: 59 | Admitting: Family Medicine

## 2019-03-16 ENCOUNTER — Other Ambulatory Visit: Payer: Self-pay

## 2019-03-16 DIAGNOSIS — R7303 Prediabetes: Secondary | ICD-10-CM

## 2019-03-16 DIAGNOSIS — E559 Vitamin D deficiency, unspecified: Secondary | ICD-10-CM | POA: Diagnosis not present

## 2019-03-16 DIAGNOSIS — Z6841 Body Mass Index (BMI) 40.0 and over, adult: Secondary | ICD-10-CM

## 2019-03-16 MED ORDER — VITAMIN D (ERGOCALCIFEROL) 1.25 MG (50000 UNIT) PO CAPS: 50000.0000 [IU] | ORAL_CAPSULE | ORAL | 0 refills | Status: DC

## 2019-03-16 MED ORDER — METFORMIN HCL 500 MG PO TABS: 500.0000 mg | ORAL_TABLET | Freq: Every day | ORAL | 0 refills | Status: DC

## 2019-03-16 NOTE — Progress Notes (Signed)
TeleHealth Visit:  Due to the COVID-19 pandemic, this visit was completed with telemedicine (audio/video) technology to reduce patient and provider exposure as well as to preserve personal protective equipment.   Mindy Collins has verbally consented to this TeleHealth visit. The patient is located at home, the provider is located at the [facility] office. The participants in this visit include the listed provider and patient. The visit was conducted today via face time.   Chief Complaint: OBESITY Mindy Collins is here to discuss her progress with her obesity treatment plan along with follow-up of her obesity related diagnoses. Mindy Collins is on the Category 3 Plan and states she is following her eating plan approximately 20% of the time. Mindy Collins states she is doing 0 minutes 0 times per week.  Today's visit was #: 25 Starting weight: 280 lbs Starting date: 09/07/17  Interim History: Mindy Collins has struggled more to staying on track with her weight loss. She is working long hours, and she is not able to plan scheduled meal time and often skips meals. Her hunger fluctuates widely.  Subjective:   1. Pre-diabetes Mindy Collins's recent A1c was 5.8, which is the best it has been in the last 3 years. I discussed labs with the patient today.  2. Vitamin D deficiency Mindy Collins's Vit D level is slowly improving, but is still far from goal at 19.7. She recently increased Vit D to every 3 days. I discussed labs with the patient today.  Assessment/Plan:   1. Pre-diabetes Mindy Collins will continue to work on weight loss, exercise, and decreasing simple carbohydrates to help decrease the risk of diabetes. We will refill metformin for 1 month, and will continue to monitor.  - metFORMIN (GLUCOPHAGE) 500 MG tablet; Take 1 tablet (500 mg total) by mouth daily with breakfast.  Dispense: 30 tablet; Refill: 0  2. Vitamin D deficiency Low Vitamin D level contributes to fatigue and are associated with obesity, breast, and colon  cancer. We will refill prescription Vitamin D for 1 month. Mindy Collins will follow-up for routine testing of Vitamin D, at least 2-3 times per year to avoid over-replacement.  - Vitamin D, Ergocalciferol, (DRISDOL) 1.25 MG (50000 UNIT) CAPS capsule; Take 1 capsule (50,000 Units total) by mouth 2 (two) times a week.  Dispense: 10 capsule; Refill: 0  3. Class 3 severe obesity with serious comorbidity and body mass index (BMI) of 50.0 to 59.9 in adult, unspecified obesity type (HCC) Mindy Collins is currently in the action stage of change. As such, her goal is to continue with weight loss efforts. She has agreed to change to keeping a food journal and adhering to recommended goals of 1100-1400 calories and 75+ grams of protein daily.   Exercise goals: No exercise has been prescribed at this time.  Behavioral modification strategies: no skipping meals.  Mindy Collins has agreed to follow-up with our clinic in 2 weeks. She was informed of the importance of frequent follow-up visits to maximize her success with intensive lifestyle modifications for her multiple health conditions.  Objective:   VITALS: Per patient if applicable, see vitals. GENERAL: Alert and in no acute distress. CARDIOPULMONARY: No increased WOB. Speaking in clear sentences.  PSYCH: Pleasant and cooperative. Speech normal rate and rhythm. Affect is appropriate. Insight and judgement are appropriate. Attention is focused, linear, and appropriate.  NEURO: Oriented as arrived to appointment on time with no prompting.   Lab Results  Component Value Date   CREATININE 0.78 02/23/2019   BUN 12 02/23/2019   NA 143 02/23/2019  K 4.5 02/23/2019   CL 102 02/23/2019   CO2 31 (H) 02/23/2019   Lab Results  Component Value Date   ALT 77 (H) 02/23/2019   AST 52 (H) 02/23/2019   ALKPHOS 138 (H) 02/23/2019   BILITOT 0.3 02/23/2019   Lab Results  Component Value Date   HGBA1C 5.8 (H) 02/23/2019   HGBA1C 5.9 (H) 09/01/2018   HGBA1C 5.9 (H) 12/21/2017    HGBA1C 6.0 (H) 09/07/2017   HGBA1C 5.9 (H) 03/05/2017   Lab Results  Component Value Date   INSULIN 3.4 02/23/2019   INSULIN 19.3 09/01/2018   INSULIN 32.3 (H) 12/21/2017   INSULIN 14.8 09/07/2017   Lab Results  Component Value Date   TSH 3.190 09/07/2017   Lab Results  Component Value Date   CHOL 239 (H) 02/23/2019   HDL 43 02/23/2019   LDLCALC 157 (H) 02/23/2019   LDLDIRECT 141.6 04/16/2010   TRIG 211 (H) 02/23/2019   CHOLHDL 4.6 (H) 03/05/2017   Lab Results  Component Value Date   WBC 12.0 (H) 12/21/2017   HGB 13.7 12/21/2017   HCT 42.0 12/21/2017   MCV 81 12/21/2017   PLT 417 (H) 03/05/2017   No results found for: IRON, TIBC, FERRITIN  Attestation Statements:   Reviewed by clinician on day of visit: allergies, medications, problem list, medical history, surgical history, family history, social history, and previous encounter notes.   I, Trixie Dredge, am acting as transcriptionist for Dennard Nip, MD.  I have reviewed the above documentation for accuracy and completeness, and I agree with the above. - Dennard Nip, MD

## 2019-03-30 ENCOUNTER — Ambulatory Visit (INDEPENDENT_AMBULATORY_CARE_PROVIDER_SITE_OTHER): Payer: 59 | Admitting: Family Medicine

## 2019-03-30 ENCOUNTER — Encounter (INDEPENDENT_AMBULATORY_CARE_PROVIDER_SITE_OTHER): Payer: Self-pay | Admitting: Family Medicine

## 2019-03-30 ENCOUNTER — Other Ambulatory Visit: Payer: Self-pay

## 2019-03-30 VITALS — BP 129/80 | HR 94 | Temp 99.0°F | Ht 63.0 in | Wt 296.0 lb

## 2019-03-30 DIAGNOSIS — Z6841 Body Mass Index (BMI) 40.0 and over, adult: Secondary | ICD-10-CM | POA: Diagnosis not present

## 2019-03-30 DIAGNOSIS — I10 Essential (primary) hypertension: Secondary | ICD-10-CM

## 2019-04-04 NOTE — Progress Notes (Signed)
Chief Complaint:   OBESITY Mindy Collins is here to discuss her progress with her obesity treatment plan along with follow-up of her obesity related diagnoses. [name] is on the Category 3 Plan and states she is following her eating plan approximately 0% of the time. [name] states she is exercising 0 minutes 0 times per week.  Today's visit was #: 26 Starting weight: 280 lbs Starting date: 09/07/2017 Today's weight: 296 lbs Today's date: 03/30/2019 Total lbs lost to date: 0 Total lbs lost since last in-office visit: 1  Interim History: Mindy Collins hasn't been journaling as regularly, mostly trying to PC/[place]. She has done well avoiding weight gain and has even lost a lb.  Subjective:   Essential hypertension. [name]'s blood pressure is stable on medications. No chest pain or headache. She is working on diet and weight loss.  BP Readings from Last 3 Encounters:  03/30/19 129/80  02/23/19 123/82  01/31/19 (!) 153/75   Lab Results  Component Value Date   CREATININE 0.78 02/23/2019   CREATININE 0.65 09/01/2018   CREATININE 0.83 12/21/2017   Assessment/Plan:   Essential hypertension. Mindy Collins is working on healthy weight loss and exercise to improve blood pressure control. We will watch for signs of hypotension as she continues her lifestyle modifications. She will continue Benicar/HCT. Will continue to monitor.  Class 3 severe obesity with serious comorbidity and body mass index (BMI) of 50.0 to 59.9 in adult, unspecified obesity type ([place]).  Mindy Collins is currently in the action stage of change. As such, her goal is to continue with weight loss efforts. She has agreed to keeping a food journal and adhering to recommended goals of 1100-1400 calories and 75+ grams of protein.   Exercise goals: For substantial health benefits, adults should do at least 150 minutes (2 hours and 30 minutes) a week of moderate-intensity, or 75 minutes (1 hour and 15 minutes) a week of vigorous-intensity aerobic  physical activity, or an equivalent combination of moderate- and vigorous-intensity aerobic activity. Aerobic activity should be performed in episodes of at least 10 minutes, and preferably, it should be spread throughout the week.  Behavioral modification strategies: increasing lean protein intake, meal planning and cooking strategies and keeping a strict food journal.  Mindy Collins has agreed to follow-up with our clinic in 3 weeks. She was informed of the importance of frequent follow-up visits to maximize her success with intensive lifestyle modifications for her multiple health conditions.   Objective:   Blood pressure 129/80, pulse 94, temperature 99 F (37.2 C), temperature source Oral, height 5\' 3"  (1.6 m), weight 296 lb (134.3 kg), SpO2 97 %. Body mass index is 52.43 kg/m.  General: Cooperative, alert, well developed, in no acute distress. HEENT: Conjunctivae and lids unremarkable. Cardiovascular: Regular rhythm.  Lungs: Normal work of breathing. Neurologic: No focal deficits.   Lab Results  Component Value Date   CREATININE 0.78 02/23/2019   BUN 12 02/23/2019   NA 143 02/23/2019   K 4.5 02/23/2019   CL 102 02/23/2019   CO2 31 (H) 02/23/2019   Lab Results  Component Value Date   ALT 77 (H) 02/23/2019   AST 52 (H) 02/23/2019   ALKPHOS 138 (H) 02/23/2019   BILITOT 0.3 02/23/2019   Lab Results  Component Value Date   HGBA1C 5.8 (H) 02/23/2019   HGBA1C 5.9 (H) 09/01/2018   HGBA1C 5.9 (H) 12/21/2017   HGBA1C 6.0 (H) 09/07/2017   HGBA1C 5.9 (H) 03/05/2017   Lab Results  Component Value Date  INSULIN 3.4 02/23/2019   INSULIN 19.3 09/01/2018   INSULIN 32.3 (H) 12/21/2017   INSULIN 14.8 09/07/2017   Lab Results  Component Value Date   TSH 3.190 09/07/2017   Lab Results  Component Value Date   CHOL 239 (H) 02/23/2019   HDL 43 02/23/2019   LDLCALC 157 (H) 02/23/2019   LDLDIRECT 141.6 04/16/2010   TRIG 211 (H) 02/23/2019   CHOLHDL 4.6 (H) 03/05/2017   Lab Results   Component Value Date   WBC 12.0 (H) 12/21/2017   HGB 13.7 12/21/2017   HCT 42.0 12/21/2017   MCV 81 12/21/2017   PLT 417 (H) 03/05/2017   No results found for: IRON, TIBC, FERRITIN  Attestation Statements:   Reviewed by clinician on day of visit: allergies, medications, problem list, medical history, surgical history, family history, social history, and previous encounter notes.  Time spent on visit including pre-visit chart review and post-visit care was 31 minutes.   I, Michaelene Song, am acting as Location manager for Dennard Nip, MD   I have reviewed the above documentation for accuracy and completeness, and I agree with the above. -  Dennard Nip, MD

## 2019-04-18 ENCOUNTER — Encounter: Payer: Self-pay | Admitting: Registered Nurse

## 2019-04-18 ENCOUNTER — Other Ambulatory Visit: Payer: Self-pay

## 2019-04-18 ENCOUNTER — Telehealth (INDEPENDENT_AMBULATORY_CARE_PROVIDER_SITE_OTHER): Payer: 59 | Admitting: Registered Nurse

## 2019-04-18 DIAGNOSIS — S161XXA Strain of muscle, fascia and tendon at neck level, initial encounter: Secondary | ICD-10-CM

## 2019-04-18 DIAGNOSIS — H60392 Other infective otitis externa, left ear: Secondary | ICD-10-CM

## 2019-04-18 DIAGNOSIS — M792 Neuralgia and neuritis, unspecified: Secondary | ICD-10-CM

## 2019-04-18 MED ORDER — HYDROCORTISONE-ACETIC ACID 1-2 % OT SOLN: 3.0000 [drp] | Freq: Three times a day (TID) | OTIC | 0 refills | Status: DC

## 2019-04-18 MED ORDER — METHOCARBAMOL 500 MG PO TABS: 500.0000 mg | ORAL_TABLET | Freq: Four times a day (QID) | ORAL | 0 refills | Status: DC

## 2019-04-18 MED ORDER — PREDNISONE 10 MG PO TABS: ORAL_TABLET | ORAL | 0 refills | Status: AC

## 2019-04-18 NOTE — Patient Instructions (Signed)
° ° ° °  If you have lab work done today you will be contacted with your lab results within the next 2 weeks.  If you have not heard from us then please contact us. The fastest way to get your results is to register for My Chart. ° ° °IF you received an x-ray today, you will receive an invoice from [facility]. Please contact  Radiology at [phone] with questions or concerns regarding your invoice.  ° °IF you received labwork today, you will receive an invoice from [facility]. Please contact [facility] at [phone] with questions or concerns regarding your invoice.  ° °Our billing staff will not be able to assist you with questions regarding bills from these companies. ° °You will be contacted with the lab results as soon as they are available. The fastest way to get your results is to activate your My Chart account. Instructions are located on the last page of this paperwork. If you have not heard from us regarding the results in 2 weeks, please contact this office. °  ° ° ° °

## 2019-04-18 NOTE — Progress Notes (Signed)
Telemedicine Encounter- SOAP NOTE Established Patient  This telephone encounter was conducted with the patient's (or proxy's) verbal consent via audio telecommunications: yes  Patient was instructed to have this encounter in a suitably private space; and to only have persons present to whom they give permission to participate. In addition, patient identity was confirmed by use of name plus two identifiers (DOB and address).  I discussed the limitations, risks, security and privacy concerns of performing an evaluation and management service by telephone and the availability of in person appointments. I also discussed with the patient that there may be a patient responsible charge related to this service. The patient expressed understanding and agreed to proceed.  I spent a total of 13 minutes talking with the patient or their proxy.  Chief Complaint  Patient presents with  . Headache    intermittent pain on left side of head/temple area only; interrupting ADLs; denies any N/V; not taking any medication therapy    Subjective   Mindy Collins is a 49 y.o. established patient. Video visit today for facial pain  HPI Patient is in today for intermittent sharp pain in her face. Starts anterior to ear, moves up towards temple and across maxillary. Some times "downwards" as well. Started around a week ago. Interrupts ADLs. Better at rest.  Feels like trapezius has been tight lately- this has happened to her in the past and she endorses a history of trapezius strain  No peripheral neuro symptoms, no visual changes, no rash, no changes to hearing.  Notes that her ear canal on the L side has been painful lately - feels irritated, cannot put ear bud in ear. Does not feel internal pressure, pain. No drainage or change to hearing.    Patient Active Problem List   Diagnosis Date Noted  . Class 3 severe obesity with serious comorbidity and body mass index (BMI) of 50.0 to 59.9 in adult ([place]) 06/22/2018    . Other fatigue 09/07/2017  . Shortness of breath on exertion 09/07/2017  . Moderate mixed hyperlipidemia not requiring statin therapy 09/07/2017  . Vitamin D deficiency 09/07/2017  . Transaminitis 03/08/2017  . Vitamin B12 deficiency 03/08/2017  . Primary insomnia 03/08/2017  . Prediabetes 10/03/2016  . Hyperlipidemia LDL goal <130 10/03/2016  . Abdominal wall seroma 03/14/2016  . Adnexal mass 03/03/2016  . Thrombocytosis ([place]) 07/31/2014  . IUD (intrauterine device) in place 07/31/2014  . DUB (dysfunctional uterine bleeding) 07/27/2014  . Intramural leiomyoma of uterus 06/30/2014  . Hydrosalpinx 06/30/2014  . Back pain 05/30/2014  . HYPERTENSION, BENIGN 04/05/2010    Past Medical History:  Diagnosis Date  . Abdominal wall seroma   . Anemia   . Arthritis   . Asthma   . B12 deficiency   . Back pain   . Carpal tunnel syndrome, bilateral   . Chest pain   . Colitis   . Constipation   . Dysmenorrhea   . Dyspnea   . Fatty liver   . GERD (gastroesophageal reflux disease)   . HLD (hyperlipidemia)   . HPV (human papilloma virus) infection    WARTS/ TCA TX  . Hypertension   . Keratoconus   . Leg edema   . Obesity   . Obesity   . Osteoarthritis   . Palpitations   . Prediabetes   . Seasonal allergies     Current Outpatient Medications  Medication Sig Dispense Refill  . cyclobenzaprine (FLEXERIL) 5 MG tablet Take 1 tablet (5 mg total) by mouth  3 (three) times daily as needed for muscle spasms. 30 tablet 0  . fluticasone (FLONASE) 50 MCG/ACT nasal spray instill 1 spray into each nostril twice a day  0  . levonorgestrel (MIRENA) 20 MCG/24HR IUD 1 each by Intrauterine route once.    . loratadine (CLARITIN) 10 MG tablet Take 10 mg by mouth daily.    . meloxicam (MOBIC) 15 MG tablet TAKE 1 TABLET BY MOUTH DAILY AS NEEDED 30 tablet 0  . metFORMIN (GLUCOPHAGE) 500 MG tablet Take 1 tablet (500 mg total) by mouth daily with breakfast. 30 tablet 0  .  olmesartan-hydrochlorothiazide (BENICAR HCT) 20-12.5 MG tablet Take 1 tablet by mouth daily. 90 tablet 0  . Vitamin D, Ergocalciferol, (DRISDOL) 1.25 MG (50000 UNIT) CAPS capsule Take 1 capsule (50,000 Units total) by mouth 2 (two) times a week. 10 capsule 0  . acetic acid-hydrocortisone (VOSOL-HC) OTIC solution Place 3 drops into the left ear 3 (three) times daily. 10 mL 0  . methocarbamol (ROBAXIN) 500 MG tablet Take 1 tablet (500 mg total) by mouth 4 (four) times daily. 60 tablet 0  . predniSONE (DELTASONE) 10 MG tablet Take 4 tablets (40 mg total) by mouth daily with breakfast for 3 days, THEN 2 tablets (20 mg total) daily with breakfast for 3 days, THEN 1 tablet (10 mg total) daily with breakfast for 3 days. 21 tablet 0   No current facility-administered medications for this visit.    Allergies  Allergen Reactions  . Esomeprazole Magnesium Shortness Of Breath    Nervousness; tolerates Aciphex.  . Adhesive [Tape] Itching and Rash    Itching and rash with adhesive tape  . Latex Itching and Dermatitis    Skin blistering  . Levocetirizine Dihydrochloride Other (See Comments)    Muscle jerks  . Nexium [Esomeprazole]     Social History   Socioeconomic History  . Marital status: Single    Spouse name: Not on file  . Number of children: 0  . Years of education: Not on file  . Highest education level: Not on file  Occupational History  . Occupation: Sport and exercise psychologist, Environmental consultant college, admin    Employer: [employer]    Comment: [employer]  Tobacco Use  . Smoking status: Never Smoker  . Smokeless tobacco: Never Used  . Tobacco comment: denies tobacco   Substance and Sexual Activity  . Alcohol use: No    Alcohol/week: 0.0 standard drinks  . Drug use: No  . Sexual activity: Yes    Birth control/protection: I.U.D.  Other Topics Concern  . Not on file  Social History Narrative   Graduate degree from [employer] in woman/gender studies.   Lives with her mother.   Social Determinants of  Health   Financial Resource Strain:   . Difficulty of Paying Living Expenses: Not on file  Food Insecurity:   . Worried About Charity fundraiser in the Last Year: Not on file  . Ran Out of Food in the Last Year: Not on file  Transportation Needs:   . Lack of Transportation (Medical): Not on file  . Lack of Transportation (Non-Medical): Not on file  Physical Activity:   . Days of Exercise per Week: Not on file  . Minutes of Exercise per Session: Not on file  Stress:   . Feeling of Stress : Not on file  Social Connections:   . Frequency of Communication with Friends and Family: Not on file  . Frequency of Social Gatherings with Friends and Family: Not on file  .  Attends Religious Services: Not on file  . Active Member of Clubs or Organizations: Not on file  . Attends Archivist Meetings: Not on file  . Marital Status: Not on file  Intimate Partner Violence:   . Fear of Current or Ex-Partner: Not on file  . Emotionally Abused: Not on file  . Physically Abused: Not on file  . Sexually Abused: Not on file    ROS Per hpi   Objective   Vitals as reported by the patient: There were no vitals filed for this visit.  Arthi was seen today for headache.  Diagnoses and all orders for this visit:  Neuralgia -     predniSONE (DELTASONE) 10 MG tablet; Take 4 tablets (40 mg total) by mouth daily with breakfast for 3 days, THEN 2 tablets (20 mg total) daily with breakfast for 3 days, THEN 1 tablet (10 mg total) daily with breakfast for 3 days.  Strain of cervical portion of left trapezius muscle -     methocarbamol (ROBAXIN) 500 MG tablet; Take 1 tablet (500 mg total) by mouth 4 (four) times daily.  Infective otitis externa of left ear -     acetic acid-hydrocortisone (VOSOL-HC) OTIC solution; Place 3 drops into the left ear 3 (three) times daily.   PLAN  Sounds as a neuralgia, will give prednisone taper and monitor for improvement  Robaxin for trapezius  strain  Acetic acid - hydrocortisone for otitis  Given return precautions. Will have to consider neurology consult with ongoing pain  Patient encouraged to call clinic with any questions, comments, or concerns.  I discussed the assessment and treatment plan with the patient. The patient was provided an opportunity to ask questions and all were answered. The patient agreed with the plan and demonstrated an understanding of the instructions.   The patient was advised to call back or seek an in-person evaluation if the symptoms worsen or if the condition fails to improve as anticipated.  I provided 13 minutes of non-face-to-face time during this encounter.  Maximiano Coss, NP  Primary Care at [facility]

## 2019-04-19 ENCOUNTER — Other Ambulatory Visit (INDEPENDENT_AMBULATORY_CARE_PROVIDER_SITE_OTHER): Payer: Self-pay | Admitting: Family Medicine

## 2019-04-19 ENCOUNTER — Encounter: Payer: 59 | Admitting: Obstetrics & Gynecology

## 2019-04-19 DIAGNOSIS — R7303 Prediabetes: Secondary | ICD-10-CM

## 2019-04-20 ENCOUNTER — Ambulatory Visit (INDEPENDENT_AMBULATORY_CARE_PROVIDER_SITE_OTHER): Payer: 59 | Admitting: Family Medicine

## 2019-04-21 ENCOUNTER — Encounter: Payer: Self-pay | Admitting: Registered Nurse

## 2019-04-22 ENCOUNTER — Other Ambulatory Visit: Payer: Self-pay | Admitting: Registered Nurse

## 2019-04-22 DIAGNOSIS — H60392 Other infective otitis externa, left ear: Secondary | ICD-10-CM

## 2019-04-22 MED ORDER — HYDROCORTISONE-ACETIC ACID 1-2 % OT SOLN: 3.0000 [drp] | Freq: Three times a day (TID) | OTIC | 0 refills | Status: DC

## 2019-04-23 ENCOUNTER — Other Ambulatory Visit (INDEPENDENT_AMBULATORY_CARE_PROVIDER_SITE_OTHER): Payer: Self-pay | Admitting: Family Medicine

## 2019-04-23 DIAGNOSIS — E559 Vitamin D deficiency, unspecified: Secondary | ICD-10-CM

## 2019-04-28 ENCOUNTER — Other Ambulatory Visit: Payer: Self-pay

## 2019-04-28 ENCOUNTER — Ambulatory Visit (INDEPENDENT_AMBULATORY_CARE_PROVIDER_SITE_OTHER): Payer: 59 | Admitting: Family Medicine

## 2019-04-28 VITALS — BP 114/68 | HR 65 | Temp 97.9°F | Ht 63.0 in | Wt 301.0 lb

## 2019-04-28 DIAGNOSIS — E559 Vitamin D deficiency, unspecified: Secondary | ICD-10-CM

## 2019-04-28 DIAGNOSIS — I10 Essential (primary) hypertension: Secondary | ICD-10-CM

## 2019-04-28 DIAGNOSIS — Z6841 Body Mass Index (BMI) 40.0 and over, adult: Secondary | ICD-10-CM

## 2019-04-28 DIAGNOSIS — R7303 Prediabetes: Secondary | ICD-10-CM | POA: Diagnosis not present

## 2019-04-28 MED ORDER — VITAMIN D (ERGOCALCIFEROL) 1.25 MG (50000 UNIT) PO CAPS: 50000.0000 [IU] | ORAL_CAPSULE | ORAL | 0 refills | Status: DC

## 2019-04-28 MED ORDER — METFORMIN HCL 500 MG PO TABS: 500.0000 mg | ORAL_TABLET | Freq: Every day | ORAL | 0 refills | Status: DC

## 2019-04-29 ENCOUNTER — Encounter: Payer: 59 | Admitting: Obstetrics & Gynecology

## 2019-05-02 NOTE — Progress Notes (Signed)
Chief Complaint:   OBESITY Abbigayl is here to discuss her progress with her obesity treatment plan along with follow-up of her obesity related diagnoses. Janei is on keeping a food journal and adhering to recommended goals of 1100-1400 calories and 75+ grams of protein daily and states she is following her eating plan approximately 0% of the time. Lameeka states she is doing 0 minutes 0 times per week.  Today's visit was #: 27 Starting weight: 280 lbs Starting date: 09/07/2017 Today's weight: 301 lbs Today's date: 04/28/2019 Total lbs lost to date: 0 Total lbs lost since last in-office visit: 0  Interim History: Seairra reports difficulty with food shopping with living on the 3rd story apartment and carrying groceries up 3 flights of stairs. This has caused her to utilize delivery food services on a frequent basis, making it a challenge to stay within her calories and protein.   Subjective:   1. Vitamin D deficiency Neetu is currently on prescription Vit D replacement.  2. Pre-diabetes Tamkia is tolerating metformin, and she denies GI upset.  3. Essential hypertension Renay reports home blood pressure readings range systolic [id number] and diastolic [id number]. She denies dizziness with positions changes. She is currently on olmesartan-hydrochlorothiazide 20-12.5 mg q daily.  Assessment/Plan:   1. Vitamin D deficiency Low Vitamin D level contributes to fatigue and are associated with obesity, breast, and colon cancer. We will refill prescription Vitamin D for 1 month. Williene will follow-up for routine testing of Vitamin D, at least 2-3 times per year to avoid over-replacement.  - Vitamin D, Ergocalciferol, (DRISDOL) 1.25 MG (50000 UNIT) CAPS capsule; Take 1 capsule (50,000 Units total) by mouth every 7 (seven) days.  Dispense: 4 capsule; Refill: 0  2. Pre-diabetes Timaya will continue to work on weight loss, exercise, and decreasing simple carbohydrates to help decrease the risk of  diabetes. We will refill metformin for 1 month.  - metFORMIN (GLUCOPHAGE) 500 MG tablet; Take 1 tablet (500 mg total) by mouth daily with breakfast.  Dispense: 30 tablet; Refill: 0  3. Essential hypertension Jenniger is working on healthy weight loss and exercise to improve blood pressure control. She will continue to monitor her blood pressure at home and continue her current anti-hypertensive regimen. We will watch for signs of hypotension as she continues her lifestyle modifications.  4. Class 3 severe obesity with serious comorbidity and body mass index (BMI) of 50.0 to 59.9 in adult, unspecified obesity type ([facility]) Cagney is currently in the action stage of change. As such, her goal is to continue with weight loss efforts. She has agreed to keeping a food journal and adhering to recommended goals of 1100-1400 calories and 75+ grams of protein daily.   We discussed home grocery delivery options. I provided contact information for [facility].  Behavioral modification strategies: meal planning and cooking strategies and keeping healthy foods in the home.  Sevaeh has agreed to follow-up with our clinic in 3 to 4 weeks. She was informed of the importance of frequent follow-up visits to maximize her success with intensive lifestyle modifications for her multiple health conditions.   Objective:   Blood pressure 114/68, pulse 65, temperature 97.9 F (36.6 C), temperature source Oral, height 5\' 3"  (1.6 m), weight (!) 301 lb (136.5 kg), SpO2 97 %. Body mass index is 53.32 kg/m.  General: Cooperative, alert, well developed, in no acute distress. HEENT: Conjunctivae and lids unremarkable. Cardiovascular: Regular rhythm.  Lungs: Normal work of breathing. Neurologic: No focal deficits.  Lab Results  Component Value Date   CREATININE 0.78 02/23/2019   BUN 12 02/23/2019   NA 143 02/23/2019   K 4.5 02/23/2019   CL 102 02/23/2019   CO2 31 (H) 02/23/2019   Lab Results  Component  Value Date   ALT 77 (H) 02/23/2019   AST 52 (H) 02/23/2019   ALKPHOS 138 (H) 02/23/2019   BILITOT 0.3 02/23/2019   Lab Results  Component Value Date   HGBA1C 5.8 (H) 02/23/2019   HGBA1C 5.9 (H) 09/01/2018   HGBA1C 5.9 (H) 12/21/2017   HGBA1C 6.0 (H) 09/07/2017   HGBA1C 5.9 (H) 03/05/2017   Lab Results  Component Value Date   INSULIN 3.4 02/23/2019   INSULIN 19.3 09/01/2018   INSULIN 32.3 (H) 12/21/2017   INSULIN 14.8 09/07/2017   Lab Results  Component Value Date   TSH 3.190 09/07/2017   Lab Results  Component Value Date   CHOL 239 (H) 02/23/2019   HDL 43 02/23/2019   LDLCALC 157 (H) 02/23/2019   LDLDIRECT 141.6 04/16/2010   TRIG 211 (H) 02/23/2019   CHOLHDL 4.6 (H) 03/05/2017   Lab Results  Component Value Date   WBC 12.0 (H) 12/21/2017   HGB 13.7 12/21/2017   HCT 42.0 12/21/2017   MCV 81 12/21/2017   PLT 417 (H) 03/05/2017   No results found for: IRON, TIBC, FERRITIN  Attestation Statements:   Reviewed by clinician on day of visit: allergies, medications, problem list, medical history, surgical history, family history, social history, and previous encounter notes.  Time spent on visit including pre-visit chart review and post-visit care and charting was 30 minutes.    I, Trixie Dredge, am acting as transcriptionist for Dennard Nip, MD.  I have reviewed the above documentation for accuracy and completeness, and I agree with the above. -  Dennard Nip, MD

## 2019-05-05 ENCOUNTER — Ambulatory Visit: Payer: 59

## 2019-05-08 ENCOUNTER — Other Ambulatory Visit (INDEPENDENT_AMBULATORY_CARE_PROVIDER_SITE_OTHER): Payer: Self-pay | Admitting: Physician Assistant

## 2019-05-08 DIAGNOSIS — I10 Essential (primary) hypertension: Secondary | ICD-10-CM

## 2019-05-09 ENCOUNTER — Ambulatory Visit (INDEPENDENT_AMBULATORY_CARE_PROVIDER_SITE_OTHER): Payer: 59 | Admitting: Family Medicine

## 2019-05-09 ENCOUNTER — Encounter (INDEPENDENT_AMBULATORY_CARE_PROVIDER_SITE_OTHER): Payer: Self-pay

## 2019-05-16 ENCOUNTER — Ambulatory Visit (INDEPENDENT_AMBULATORY_CARE_PROVIDER_SITE_OTHER): Payer: 59 | Admitting: Family Medicine

## 2019-05-16 ENCOUNTER — Encounter (INDEPENDENT_AMBULATORY_CARE_PROVIDER_SITE_OTHER): Payer: Self-pay

## 2019-06-13 ENCOUNTER — Ambulatory Visit: Payer: 59 | Admitting: Obstetrics & Gynecology

## 2019-06-13 ENCOUNTER — Encounter: Payer: Self-pay | Admitting: Obstetrics & Gynecology

## 2019-06-13 ENCOUNTER — Other Ambulatory Visit: Payer: Self-pay

## 2019-06-13 VITALS — BP 140/88 | Ht 62.0 in | Wt 301.0 lb

## 2019-06-13 DIAGNOSIS — T8332XA Displacement of intrauterine contraceptive device, initial encounter: Secondary | ICD-10-CM | POA: Diagnosis not present

## 2019-06-13 DIAGNOSIS — Z975 Presence of (intrauterine) contraceptive device: Secondary | ICD-10-CM

## 2019-06-13 DIAGNOSIS — Z01419 Encounter for gynecological examination (general) (routine) without abnormal findings: Secondary | ICD-10-CM

## 2019-06-13 DIAGNOSIS — T8332XD Displacement of intrauterine contraceptive device, subsequent encounter: Secondary | ICD-10-CM

## 2019-06-13 DIAGNOSIS — N949 Unspecified condition associated with female genital organs and menstrual cycle: Secondary | ICD-10-CM | POA: Diagnosis not present

## 2019-06-13 NOTE — Progress Notes (Signed)
Mindy Collins 1971/01/05 [id number]   History:    49 y.o. G0 Single  RP:  Established patient presenting for annual gyn exam   HPI: Well on Mirena IUD since June 2016.  No menstrual period.  No pelvic pain.  Normal vaginal secretions.  Abstinent.  Breasts normal.  Urine and bowel movements normal.  Considering [facility] Weight Loss Program with OptiFast.  Body mass index increased to 55.05.     Past medical history,surgical history, family history and social history were all reviewed and documented in the EPIC chart.  Gynecologic History No LMP recorded. (Menstrual status: IUD).  Obstetric History OB History  Gravida Para Term Preterm AB Living  0            SAB TAB Ectopic Multiple Live Births                ROS: A ROS was performed and pertinent positives and negatives are included in the history.  GENERAL: No fevers or chills. HEENT: No change in vision, no earache, sore throat or sinus congestion. NECK: No pain or stiffness. CARDIOVASCULAR: No chest pain or pressure. No palpitations. PULMONARY: No shortness of breath, cough or wheeze. GASTROINTESTINAL: No abdominal pain, nausea, vomiting or diarrhea, melena or bright red blood per rectum. GENITOURINARY: No urinary frequency, urgency, hesitancy or dysuria. MUSCULOSKELETAL: No joint or muscle pain, no back pain, no recent trauma. DERMATOLOGIC: No rash, no itching, no lesions. ENDOCRINE: No polyuria, polydipsia, no heat or cold intolerance. No recent change in weight. HEMATOLOGICAL: No anemia or easy bruising or bleeding. NEUROLOGIC: No headache, seizures, numbness, tingling or weakness. PSYCHIATRIC: No depression, no loss of interest in normal activity or change in sleep pattern.     Exam:   BP 140/88   Ht 5\' 2"  (1.575 m)   Wt (!) 301 lb (136.5 kg)   BMI 55.05 kg/m   Body mass index is 55.05 kg/m.  General appearance : Well developed well nourished female. No acute distress HEENT: Eyes: no retinal hemorrhage or  exudates,  Neck supple, trachea midline, no carotid bruits, no thyroidmegaly Lungs: Clear to auscultation, no rhonchi or wheezes, or rib retractions  Heart: Regular rate and rhythm, no murmurs or gallops Breast:Examined in sitting and supine position were symmetrical in appearance, no palpable masses or tenderness,  no skin retraction, no nipple inversion, no nipple discharge, no skin discoloration, no axillary or supraclavicular lymphadenopathy Abdomen: no palpable masses or tenderness, no rebound or guarding Extremities: no edema or skin discoloration or tenderness  Pelvic exam limited by obesity:             Vulva: Normal             Vagina: No gross lesions or discharge  Cervix: No gross lesions or discharge.  IUD strings not felt (confirmed good IU position of IUD by Pelvic US 03/2018)  Uterus  AV, normal size, shape and consistency, non-tender and mobile  Adnexa  Without masses or tenderness  Anus: Normal   Assessment/Plan:  49 y.o. female for annual exam   1. Well female exam with routine gynecological exam Normal gynecologic exam, but limited by obesity.  No indication to repeat a Pap test this year.  Breast exam normal.  Will schedule a screening mammogram now.  Health labs with family physician.    2. IUD (intrauterine device) in place  IUD strings not felt, but confirmed good intrauterine position of the IUD by pelvic ultrasound in February 2020.  Mirena IUD inserted  in June 2016, will follow up for removal of IUD under ultrasound.  3. Intrauterine contraceptive device threads lost, subsequent encounter Removal of IUD under ultrasound guidance at follow-up.  4. Adnexal cyst Stable avascular pelvic cyst on last ultrasound February 2020, will recheck at follow-up ultrasound.  5. Morbid obesity ([place]) Considering [facility] weight loss program with Optifast.  Recommend aerobic activities 5 times a week and light weightlifting every 2 days.  Princess Bruins MD, 3:56 PM  06/13/2019

## 2019-06-14 ENCOUNTER — Encounter: Payer: Self-pay | Admitting: Obstetrics & Gynecology

## 2019-06-14 NOTE — Patient Instructions (Signed)
1. Well female exam with routine gynecological exam Normal gynecologic exam, but limited by obesity.  No indication to repeat a Pap test this year.  Breast exam normal.  Will schedule a screening mammogram now.  Health labs with family physician.    2. IUD (intrauterine device) in place  IUD strings not felt, but confirmed good intrauterine position of the IUD by pelvic ultrasound in February 2020.  Mirena IUD inserted in June 2016, will follow up for removal of IUD under ultrasound.  3. Intrauterine contraceptive device threads lost, subsequent encounter Removal of IUD under ultrasound guidance at follow-up.  4. Adnexal cyst Stable avascular pelvic cyst on last ultrasound February 2020, will recheck at follow-up ultrasound.  5. Morbid obesity ([place]) Considering [facility] program with Optifast.  Recommend aerobic activities 5 times a week and light weightlifting every 2 days.  [name], it was a pleasure seeing you today!

## 2019-06-28 ENCOUNTER — Ambulatory Visit (INDEPENDENT_AMBULATORY_CARE_PROVIDER_SITE_OTHER): Payer: 59 | Admitting: Family Medicine

## 2019-06-28 ENCOUNTER — Encounter (INDEPENDENT_AMBULATORY_CARE_PROVIDER_SITE_OTHER): Payer: Self-pay | Admitting: Family Medicine

## 2019-06-28 ENCOUNTER — Other Ambulatory Visit: Payer: Self-pay

## 2019-06-28 VITALS — BP 119/82 | HR 93 | Temp 98.3°F | Ht 63.0 in | Wt 299.0 lb

## 2019-06-28 DIAGNOSIS — E559 Vitamin D deficiency, unspecified: Secondary | ICD-10-CM

## 2019-06-28 DIAGNOSIS — Z9189 Other specified personal risk factors, not elsewhere classified: Secondary | ICD-10-CM | POA: Diagnosis not present

## 2019-06-28 DIAGNOSIS — R7303 Prediabetes: Secondary | ICD-10-CM

## 2019-06-28 DIAGNOSIS — Z6841 Body Mass Index (BMI) 40.0 and over, adult: Secondary | ICD-10-CM

## 2019-06-28 MED ORDER — METFORMIN HCL 500 MG PO TABS: 500.0000 mg | ORAL_TABLET | Freq: Every day | ORAL | 0 refills | Status: DC

## 2019-06-28 MED ORDER — VITAMIN D (ERGOCALCIFEROL) 1.25 MG (50000 UNIT) PO CAPS: 50000.0000 [IU] | ORAL_CAPSULE | ORAL | 0 refills | Status: DC

## 2019-06-28 NOTE — Progress Notes (Signed)
Chief Complaint:   OBESITY Mindy Collins is here to discuss her progress with her obesity treatment plan along with follow-up of her obesity related diagnoses. Mindy Collins is on keeping a food journal and adhering to recommended goals of 1100-1400 calories and 75+ grams of protein daily and states she is following her eating plan approximately 0% of the time. Mindy Collins states she is doing 0 minutes 0 times per week.  Today's visit was #: 28 Starting weight: 280 lbs Starting date: 09/07/2017 Today's weight: 299 lbs Today's date: 06/28/2019 Total lbs lost to date: 0 Total lbs lost since last in-office visit: 2  Interim History: Mindy Collins did not find the journaling meal plan helpful and she focused on portion control, and increased water intake to at least 64 oz of water per day.  Subjective:   1. Pre-diabetes Mindy Collins's A1c on 02/23/2019 was 5.8. She is on metformin 500 mg q daily, and is tolerating it well.  2. Vitamin D deficiency Mindy Collins's Vit D level on 02/23/2019 was 19.7. She has been taking prescription strength Vit D supplementation twice weekly, and is tolerating it well.  3. At risk for osteoporosis Mindy Collins is at higher risk of osteopenia and osteoporosis due to Vitamin D deficiency.   Assessment/Plan:   1. Pre-diabetes Mindy Collins will continue to work on weight loss, exercise, and decreasing simple carbohydrates to help decrease the risk of diabetes. We will refill metformin for 1 month, and we will recheck labs at her next office visit.  - metFORMIN (GLUCOPHAGE) 500 MG tablet; Take 1 tablet (500 mg total) by mouth daily with breakfast.  Dispense: 30 tablet; Refill: 0  2. Vitamin D deficiency Low Vitamin D level contributes to fatigue and are associated with obesity, breast, and colon cancer. We will refill prescription Vitamin D for 1 month. Mindy Collins will follow-up for routine testing of Vitamin D, at least 2-3 times per year to avoid over-replacement. We will recheck labs at her next office visit.  -  Vitamin D, Ergocalciferol, (DRISDOL) 1.25 MG (50000 UNIT) CAPS capsule; Take 1 capsule (50,000 Units total) by mouth every 3 (three) days.  Dispense: 8 capsule; Refill: 0  3. At risk for osteoporosis Mindy Collins was given approximately 15 minutes of osteoporosis prevention counseling today. Mindy Collins is at risk for osteopenia and osteoporosis due to her Vitamin D deficiency. She was encouraged to take her Vitamin D and follow her higher calcium diet and increase strengthening exercise to help strengthen her bones and decrease her risk of osteopenia and osteoporosis.  Repetitive spaced learning was employed today to elicit superior memory formation and behavioral change.  4. Class 3 severe obesity with serious comorbidity and body mass index (BMI) of 50.0 to 59.9 in adult, unspecified obesity type (HCC) Mindy Collins is currently in the action stage of change. As such, her goal is to continue with weight loss efforts. She has agreed to following a lower carbohydrate, vegetable and lean protein rich diet plan.   Exercise goals: No exercise has been prescribed at this time.  Behavioral modification strategies: increasing lean protein intake, decreasing simple carbohydrates, decreasing eating out, no skipping meals and meal planning and cooking strategies.  Mindy Collins has agreed to follow-up with our clinic in 3 weeks. She was informed of the importance of frequent follow-up visits to maximize her success with intensive lifestyle modifications for her multiple health conditions.   Objective:   Blood pressure 119/82, pulse 93, temperature 98.3 F (36.8 C), temperature source Oral, height 5\' 3"  (1.6 m), weight 299 lb (  135.6 kg), SpO2 98 %. Body mass index is 52.97 kg/m.  General: Cooperative, alert, well developed, in no acute distress. HEENT: Conjunctivae and lids unremarkable. Cardiovascular: Regular rhythm.  Lungs: Normal work of breathing. Neurologic: No focal deficits.   Lab Results  Component Value Date    CREATININE 0.78 02/23/2019   BUN 12 02/23/2019   NA 143 02/23/2019   K 4.5 02/23/2019   CL 102 02/23/2019   CO2 31 (H) 02/23/2019   Lab Results  Component Value Date   ALT 77 (H) 02/23/2019   AST 52 (H) 02/23/2019   ALKPHOS 138 (H) 02/23/2019   BILITOT 0.3 02/23/2019   Lab Results  Component Value Date   HGBA1C 5.8 (H) 02/23/2019   HGBA1C 5.9 (H) 09/01/2018   HGBA1C 5.9 (H) 12/21/2017   HGBA1C 6.0 (H) 09/07/2017   HGBA1C 5.9 (H) 03/05/2017   Lab Results  Component Value Date   INSULIN 3.4 02/23/2019   INSULIN 19.3 09/01/2018   INSULIN 32.3 (H) 12/21/2017   INSULIN 14.8 09/07/2017   Lab Results  Component Value Date   TSH 3.190 09/07/2017   Lab Results  Component Value Date   CHOL 239 (H) 02/23/2019   HDL 43 02/23/2019   LDLCALC 157 (H) 02/23/2019   LDLDIRECT 141.6 04/16/2010   TRIG 211 (H) 02/23/2019   CHOLHDL 4.6 (H) 03/05/2017   Lab Results  Component Value Date   WBC 12.0 (H) 12/21/2017   HGB 13.7 12/21/2017   HCT 42.0 12/21/2017   MCV 81 12/21/2017   PLT 417 (H) 03/05/2017   No results found for: IRON, TIBC, FERRITIN  Attestation Statements:   Reviewed by clinician on day of visit: allergies, medications, problem list, medical history, surgical history, family history, social history, and previous encounter notes.   I, Trixie Dredge, am acting as transcriptionist for Dennard Nip, MD.  I have reviewed the above documentation for accuracy and completeness, and I agree with the above. -  Dennard Nip, MD

## 2019-07-12 ENCOUNTER — Other Ambulatory Visit: Payer: Self-pay

## 2019-07-12 ENCOUNTER — Ambulatory Visit
Admission: RE | Admit: 2019-07-12 | Discharge: 2019-07-12 | Disposition: A | Discharge status: Home / Self Care | Payer: 59 | Source: Ambulatory Visit | Attending: Obstetrics & Gynecology | Admitting: Obstetrics & Gynecology

## 2019-07-12 DIAGNOSIS — Z1231 Encounter for screening mammogram for malignant neoplasm of breast: Secondary | ICD-10-CM

## 2019-07-13 ENCOUNTER — Encounter: Payer: Self-pay | Admitting: Registered Nurse

## 2019-07-13 ENCOUNTER — Ambulatory Visit: Payer: 59 | Admitting: Registered Nurse

## 2019-07-13 ENCOUNTER — Other Ambulatory Visit: Payer: Self-pay

## 2019-07-13 VITALS — BP 125/65 | HR 69 | Temp 98.0°F | Resp 17 | Ht 62.0 in | Wt 299.0 lb

## 2019-07-13 DIAGNOSIS — R252 Cramp and spasm: Secondary | ICD-10-CM | POA: Diagnosis not present

## 2019-07-13 NOTE — Patient Instructions (Addendum)
Yoga with [email or web address]   If you have lab work done today you will be contacted with your lab results within the next 2 weeks.  If you have not heard from [place] then please contact us. The fastest way to get your results is to register for My Chart.   IF you received an x-ray today, you will receive an invoice from [facility]. Please contact [facility] at [phone] with questions or concerns regarding your invoice.   IF you received labwork today, you will receive an invoice from [place]. Please contact [facility] at [phone] with questions or concerns regarding your invoice.   Our billing staff will not be able to assist you with questions regarding bills from these companies.  You will be contacted with the lab results as soon as they are available. The fastest way to get your results is to activate your My Chart account. Instructions are located on the last page of this paperwork. If you have not heard from [place] regarding the results in 2 weeks, please contact this office.

## 2019-07-14 LAB — COMPREHENSIVE METABOLIC PANEL
ALT: 80 IU/L — ABNORMAL HIGH (ref 0–32)
AST: 52 IU/L — ABNORMAL HIGH (ref 0–40)
Albumin/Globulin Ratio: 1.4 (ref 1.2–2.2)
Albumin: 4.4 g/dL (ref 3.8–4.8)
Alkaline Phosphatase: 121 IU/L (ref 48–121)
BUN/Creatinine Ratio: 17 (ref 9–23)
BUN: 14 mg/dL (ref 6–24)
Bilirubin Total: 0.7 mg/dL (ref 0.0–1.2)
CO2: 28 mmol/L (ref 20–29)
Calcium: 9.9 mg/dL (ref 8.7–10.2)
Chloride: 103 mmol/L (ref 96–106)
Creatinine, Ser: 0.81 mg/dL (ref 0.57–1.00)
GFR calc Af Amer: 101 mL/min/{1.73_m2} (ref >59)
GFR calc non Af Amer: 87 mL/min/{1.73_m2} (ref >59)
Globulin, Total: 3.2 g/dL (ref 1.5–4.5)
Glucose: 94 mg/dL (ref 65–99)
Potassium: 3.8 mmol/L (ref 3.5–5.2)
Sodium: 145 mmol/L — ABNORMAL HIGH (ref 134–144)
Total Protein: 7.6 g/dL (ref 6.0–8.5)

## 2019-07-14 LAB — MAGNESIUM: Magnesium: 2.3 mg/dL (ref 1.6–2.3)

## 2019-07-14 LAB — VITAMIN D 25 HYDROXY (VIT D DEFICIENCY, FRACTURES): Vit D, 25-Hydroxy: 20.2 ng/mL — ABNORMAL LOW (ref 30.0–100.0)

## 2019-07-14 LAB — VITAMIN B12: Vitamin B-12: 446 pg/mL (ref 232–1245)

## 2019-07-15 NOTE — Progress Notes (Signed)
Established Patient Office Visit  Subjective:  Patient ID: Mindy Collins, female    DOB: 1970-09-05  Age: 49 y.o. MRN: [id number]  CC:  Chief Complaint  Patient presents with  . Fatigue    Patient states she has been having lower back pain and weakness of the legs it seems like everything tighten up just to hold her up for about 2 months. Per patient she do some exercises to make it better but feels like body goes into a spasm even with the knee at this point    HPI Mindy Collins presents for fatigue and cramping  Has been worsening since onset about 2 months ago States that she has gained significant weight during COVID. Has been trying to exercise and be more active but feels cramping and tightness in lower back, hips, and thighs when walking any significant distance.  No numbness, tingling, saddle anesthesia, acute injury, history of injury, or history of abnormal imaging.  Symptoms resolved with rest History of Vit D and magnesium deficiencies resolved with supplementation.  Otherwise no further symptoms or concerns.  Past Medical History:  Diagnosis Date  . Abdominal wall seroma   . Anemia   . Arthritis   . Asthma   . B12 deficiency   . Back pain   . Carpal tunnel syndrome, bilateral   . Chest pain   . Colitis   . Constipation   . Dysmenorrhea   . Dyspnea   . Fatty liver   . GERD (gastroesophageal reflux disease)   . HLD (hyperlipidemia)   . HPV (human papilloma virus) infection    WARTS/ TCA [place]  . Hypertension   . Keratoconus   . Leg edema   . Obesity   . Obesity   . Osteoarthritis   . Palpitations   . Prediabetes   . Seasonal allergies     Past Surgical History:  Procedure Laterality Date  . GYNECOLOGIC CRYOSURGERY  1993   DYSPLASIA CERVIX/   . MYOMECTOMY  02/17/2011   Procedure: MYOMECTOMY;  Surgeon: Terrance Mass, MD;  Location: [facility];  Service: Gynecology;  Laterality: N/A;  I am hoping to get a 7:30am time on Dec 4, Dec 11 or Dec 12. It not  available I will check on some 1:00 pm times. Thanks  . MYOMECTOMY ABDOMINAL APPROACH  04/31/2007  . OVARIAN CYST REMOVAL  02/17/2011   Procedure: OVARIAN CYSTECTOMY;  Surgeon: Terrance Mass, MD;  Location: [facility];  Service: Gynecology;  Laterality: Left;    Family History  Problem Relation Age of Onset  . Hypertension Mother   . Breast cancer Mother   . Cancer Mother   . Hyperlipidemia Mother   . Thyroid disease Mother   . Obesity Mother   . Heart disease Maternal Grandmother   . Cancer Father        LUNG  . Breast cancer Maternal Aunt   . Diabetes Maternal Grandfather   . Cancer Paternal Aunt   . Ovarian cancer Paternal Aunt     Social History   Socioeconomic History  . Marital status: Single    Spouse name: Not on file  . Number of children: 0  . Years of education: Not on file  . Highest education level: Not on file  Occupational History  . Occupation: Sport and exercise psychologist, Environmental consultant college, admin    Employer: [employer]    Comment: [employer]  Tobacco Use  . Smoking status: Never Smoker  . Smokeless tobacco: Never Used  . Tobacco comment:  denies tobacco   Substance and Sexual Activity  . Alcohol use: No    Alcohol/week: 0.0 standard drinks  . Drug use: No  . Sexual activity: Yes    Birth control/protection: I.U.D.  Other Topics Concern  . Not on file  Social History Narrative   Graduate degree from [employer] in woman/gender studies.   Lives with her mother.   Social Determinants of Health   Financial Resource Strain:   . Difficulty of Paying Living Expenses:   Food Insecurity:   . Worried About Charity fundraiser in the Last Year:   . Arboriculturist in the Last Year:   Transportation Needs:   . Film/video editor (Medical):   Marland Kitchen Lack of Transportation (Non-Medical):   Physical Activity:   . Days of Exercise per Week:   . Minutes of Exercise per Session:   Stress:   . Feeling of Stress :   Social Connections:   . Frequency of Communication with  Friends and Family:   . Frequency of Social Gatherings with Friends and Family:   . Attends Religious Services:   . Active Member of Clubs or Organizations:   . Attends Archivist Meetings:   Marland Kitchen Marital Status:   Intimate Partner Violence:   . Fear of Current or Ex-Partner:   . Emotionally Abused:   Marland Kitchen Physically Abused:   . Sexually Abused:     Outpatient Medications Prior to Visit  Medication Sig Dispense Refill  . acetic acid-hydrocortisone (VOSOL-HC) OTIC solution Place 3 drops into both ears 3 (three) times daily. 10 mL 0  . levonorgestrel (MIRENA) 20 MCG/24HR IUD 1 each by Intrauterine route once.    . meloxicam (MOBIC) 15 MG tablet TAKE 1 TABLET BY MOUTH DAILY AS NEEDED 30 tablet 0  . metFORMIN (GLUCOPHAGE) 500 MG tablet Take 1 tablet (500 mg total) by mouth daily with breakfast. 30 tablet 0  . olmesartan-hydrochlorothiazide (BENICAR HCT) 20-12.5 MG tablet TAKE 1 TABLET BY MOUTH DAILY 90 tablet 0  . Vitamin D, Ergocalciferol, (DRISDOL) 1.25 MG (50000 UNIT) CAPS capsule Take 1 capsule (50,000 Units total) by mouth every 3 (three) days. 8 capsule 0  . fluticasone (FLONASE) 50 MCG/ACT nasal spray instill 1 spray into each nostril twice a day  0  . loratadine (CLARITIN) 10 MG tablet Take 10 mg by mouth daily.    . methocarbamol (ROBAXIN) 500 MG tablet Take 1 tablet (500 mg total) by mouth 4 (four) times daily. (Patient not taking: Reported on 07/13/2019) 60 tablet 0   No facility-administered medications prior to visit.    Allergies  Allergen Reactions  . Esomeprazole Magnesium Shortness Of Breath    Nervousness; tolerates Aciphex.  . Adhesive [Tape] Itching and Rash    Itching and rash with adhesive tape  . Latex Itching and Dermatitis    Skin blistering  . Levocetirizine Dihydrochloride Other (See Comments)    Muscle jerks  . Nexium [Esomeprazole]     ROS Review of Systems  Constitutional: Negative.   HENT: Negative.   Eyes: Negative.   Respiratory: Negative.    Cardiovascular: Negative.   Gastrointestinal: Negative.   Endocrine: Negative.   Genitourinary: Negative.   Musculoskeletal: Positive for back pain and myalgias. Negative for arthralgias, gait problem, joint swelling, neck pain and neck stiffness.  Skin: Negative.   Allergic/Immunologic: Negative.   Neurological: Negative.   Hematological: Negative.   Psychiatric/Behavioral: Negative.   All other systems reviewed and are negative.     Objective:  Physical Exam  Constitutional: She is oriented to person, place, and time. She appears well-developed and well-nourished. No distress.  Cardiovascular: Normal rate and regular rhythm.  Pulmonary/Chest: Effort normal. No respiratory distress.  Musculoskeletal:        General: No tenderness, deformity or edema. Normal range of motion.  Neurological: She is alert and oriented to person, place, and time.  Skin: Skin is warm and dry. No rash noted. She is not diaphoretic. No erythema. No pallor.  Psychiatric: She has a normal mood and affect. Her behavior is normal. Judgment and thought content normal.  Nursing note and vitals reviewed.   BP 125/65   Pulse 69   Temp 98 F (36.7 C) (Temporal)   Resp 17   Ht 5\' 2"  (1.575 m)   Wt 299 lb (135.6 kg)   SpO2 99%   BMI 54.69 kg/m  Wt Readings from Last 3 Encounters:  07/13/19 299 lb (135.6 kg)  06/28/19 299 lb (135.6 kg)  06/13/19 (!) 301 lb (136.5 kg)     There are no preventive care reminders to display for this patient.  There are no preventive care reminders to display for this patient.  Lab Results  Component Value Date   TSH 3.190 09/07/2017   Lab Results  Component Value Date   WBC 12.0 (H) 12/21/2017   HGB 13.7 12/21/2017   HCT 42.0 12/21/2017   MCV 81 12/21/2017   PLT 417 (H) 03/05/2017   Lab Results  Component Value Date   NA 145 (H) 07/13/2019   K 3.8 07/13/2019   CO2 28 07/13/2019   GLUCOSE 94 07/13/2019   BUN 14 07/13/2019   CREATININE 0.81 07/13/2019    BILITOT 0.7 07/13/2019   ALKPHOS 121 07/13/2019   AST 52 (H) 07/13/2019   ALT 80 (H) 07/13/2019   PROT 7.6 07/13/2019   ALBUMIN 4.4 07/13/2019   CALCIUM 9.9 07/13/2019   ANIONGAP 6 10/31/2014   Lab Results  Component Value Date   CHOL 239 (H) 02/23/2019   Lab Results  Component Value Date   HDL 43 02/23/2019   Lab Results  Component Value Date   LDLCALC 157 (H) 02/23/2019   Lab Results  Component Value Date   TRIG 211 (H) 02/23/2019   Lab Results  Component Value Date   CHOLHDL 4.6 (H) 03/05/2017   Lab Results  Component Value Date   HGBA1C 5.8 (H) 02/23/2019      Assessment & Plan:   Problem List Items Addressed This Visit    None    Visit Diagnoses    Leg cramping    -  Primary   Relevant Orders   Comprehensive metabolic panel (Completed)   Magnesium (Completed)   Vitamin D, 25-hydroxy (Completed)   Vitamin B12 (Completed)      No orders of the defined types were placed in this encounter.   Follow-up: No follow-ups on file.   PLAN  Will collect labs. May consider deficiencies but likely at this time feel that this is fatigue from weight gain and deconditioning. Discussed exercise and diet for weight loss, pt understands and is taking healthy steps  Will follow up as warranted  Patient encouraged to call clinic with any questions, comments, or concerns.  Maximiano Coss, NP

## 2019-07-20 NOTE — Progress Notes (Signed)
If we could call Ms Beavers -   Her labs are showing mild elevation in sodium and low Vitamin D. She should take an over the counter supplement daily for vitamin D and make sure she is staying very well hydrated. This will certainly help her cramping.  Thank you,  Kathrin Ruddy, NP

## 2019-07-21 ENCOUNTER — Other Ambulatory Visit: Payer: Self-pay | Admitting: Obstetrics & Gynecology

## 2019-07-21 ENCOUNTER — Ambulatory Visit: Payer: 59 | Admitting: Obstetrics & Gynecology

## 2019-07-21 ENCOUNTER — Other Ambulatory Visit: Payer: Self-pay

## 2019-07-21 ENCOUNTER — Ambulatory Visit (INDEPENDENT_AMBULATORY_CARE_PROVIDER_SITE_OTHER): Payer: 59

## 2019-07-21 DIAGNOSIS — N854 Malposition of uterus: Secondary | ICD-10-CM | POA: Diagnosis not present

## 2019-07-21 DIAGNOSIS — T8332XA Displacement of intrauterine contraceptive device, initial encounter: Secondary | ICD-10-CM

## 2019-07-21 DIAGNOSIS — Z30432 Encounter for removal of intrauterine contraceptive device: Secondary | ICD-10-CM | POA: Diagnosis not present

## 2019-07-21 DIAGNOSIS — N949 Unspecified condition associated with female genital organs and menstrual cycle: Secondary | ICD-10-CM

## 2019-07-21 DIAGNOSIS — T8332XD Displacement of intrauterine contraceptive device, subsequent encounter: Secondary | ICD-10-CM

## 2019-07-21 DIAGNOSIS — D259 Leiomyoma of uterus, unspecified: Secondary | ICD-10-CM | POA: Diagnosis not present

## 2019-07-21 DIAGNOSIS — Z538 Procedure and treatment not carried out for other reasons: Secondary | ICD-10-CM

## 2019-07-21 DIAGNOSIS — N882 Stricture and stenosis of cervix uteri: Secondary | ICD-10-CM | POA: Diagnosis not present

## 2019-07-21 DIAGNOSIS — Z975 Presence of (intrauterine) contraceptive device: Secondary | ICD-10-CM

## 2019-07-21 MED ORDER — MISOPROSTOL 200 MCG PO TABS
400.0000 ug | ORAL_TABLET | Freq: Once | ORAL | 0 refills | Status: DC
Start: 2019-07-21 — End: 2019-08-18

## 2019-07-21 NOTE — Progress Notes (Signed)
    Mindy Collins [birth year] [id number]        49 y.o.  G0 Single  RP: Mirena IUD removal under US guidance for lost IUD strings  HPI: Well on Mirena IUD since June 2016. No menstrual period. No pelvic pain. Normal vaginal secretions. Abstinent.  At last visit 06/13/2019 the IUD strings were not visible at the External Os. Obesity.   OB History  Gravida Para Term Preterm AB Living  0            SAB TAB Ectopic Multiple Live Births               Past medical history,surgical history, problem list, medications, allergies, family history and social history were all reviewed and documented in the EPIC chart.   Directed ROS with pertinent positives and negatives documented in the history of present illness/assessment and plan.  Exam:  There were no vitals filed for this visit. General appearance:  Normal  Pelvic US: Comparison is made with previous scan February 2020.  Both transabdominal and transvaginal techniques were necessary to evaluate the anatomy.  Anteverted uterus with several small intramural fibroids with no significant change since previous scan.  The uterus is measured at 9.5 x 5.65 x 4.64 cm.  The IUD is noted in proper position within the uterine cavity.  Both ovaries are small with atrophic appearance, best seen abdominally.  Mid pelvis cystic mass with septation is again noted with no significant change in size or appearance since previous scan, probable hydrosalpinx.  Measured today at 12 x 7 x 4 cm.  Speculum inserted in the vagina.  The IUD strings are not visible at the external os.  Anterior lip of the cervix grasped with a tenaculum.  Betadine prep done on cervix.  Hurricaine spray on cervix.  The cervix is very stenotic and the os finder cannot be inserted.  We therefore use the very small dilator set.  Even with the dilator set we do not reach the intra uterine cavity.  The os finder is attempted once more without success.  The patient is experiencing significant  discomfort and pain.  The decision is therefore taken to proceed under anesthesia at [facility] surgical center after softening the cervix with Cytotec.  The procedure is therefore stopped at this point.  Unsuccessful attempt at IUD removal under ultrasound guidance due to stenotic cervix.   Assessment/Plan:  49 y.o. G0P0   1. Intrauterine contraceptive device threads lost, subsequent encounter Attempted removal of Mirena IUD under ultrasound guidance was unsuccessful due to a stenotic cervix.  Dilation of the cervix with a small dilator kit and os finder.  Significant discomfort and pain felt by patient and intra uterine cavity not reached.  Decision to proceed at [facility] surgical center under anesthesia with Cytotec softening of cervix prior to procedure.  Will dilate the cervix, do a hysteroscopy and removal of IUD.  2. Stenosis of cervix uteri As above.  3. Unsuccessful attempt to remove intrauterine device (IUD) As above.  Princess Bruins MD, 3:11 PM 07/21/2019

## 2019-07-24 ENCOUNTER — Encounter: Payer: Self-pay | Admitting: Obstetrics & Gynecology

## 2019-07-24 NOTE — Patient Instructions (Signed)
1. Intrauterine contraceptive device threads lost, subsequent encounter Attempted removal of Mirena IUD under ultrasound guidance was unsuccessful due to a stenotic cervix.  Dilation of the cervix with a small dilator kit and os finder.  Significant discomfort and pain felt by patient and intra uterine cavity not reached.  Decision to proceed at [facility] surgical center under anesthesia with Cytotec softening of cervix prior to procedure.  Will dilate the cervix, do a hysteroscopy and removal of IUD.  2. Stenosis of cervix uteri As above.  3. Unsuccessful attempt to remove intrauterine device (IUD) As above.  [name], it was a pleasure seeing you today!

## 2019-07-25 ENCOUNTER — Ambulatory Visit (INDEPENDENT_AMBULATORY_CARE_PROVIDER_SITE_OTHER): Payer: 59 | Admitting: Family Medicine

## 2019-07-26 ENCOUNTER — Other Ambulatory Visit: Payer: Self-pay

## 2019-07-27 ENCOUNTER — Other Ambulatory Visit: Payer: Self-pay

## 2019-07-27 ENCOUNTER — Encounter: Payer: Self-pay | Admitting: Registered Nurse

## 2019-07-27 ENCOUNTER — Ambulatory Visit (INDEPENDENT_AMBULATORY_CARE_PROVIDER_SITE_OTHER): Payer: 59 | Admitting: Registered Nurse

## 2019-07-27 VITALS — BP 123/82 | HR 95 | Temp 98.0°F | Resp 17 | Ht 62.0 in | Wt 298.8 lb

## 2019-07-27 DIAGNOSIS — G5603 Carpal tunnel syndrome, bilateral upper limbs: Secondary | ICD-10-CM | POA: Diagnosis not present

## 2019-07-27 DIAGNOSIS — I10 Essential (primary) hypertension: Secondary | ICD-10-CM

## 2019-07-27 MED ORDER — OLMESARTAN MEDOXOMIL-HCTZ 20-12.5 MG PO TABS: 1.0000 | ORAL_TABLET | Freq: Every day | ORAL | 3 refills | Status: DC

## 2019-07-27 MED ORDER — DICLOFENAC SODIUM 1 % EX GEL: 4.0000 g | Freq: Four times a day (QID) | CUTANEOUS | 1 refills | Status: DC

## 2019-07-27 NOTE — Patient Instructions (Signed)
° ° ° °  If you have lab work done today you will be contacted with your lab results within the next 2 weeks.  If you have not heard from us then please contact us. The fastest way to get your results is to register for My Chart. ° ° °IF you received an x-ray today, you will receive an invoice from [facility]. Please contact [facility] at [phone] with questions or concerns regarding your invoice.  ° °IF you received labwork today, you will receive an invoice from [facility]. Please contact [facility] at [phone] with questions or concerns regarding your invoice.  ° °Our billing staff will not be able to assist you with questions regarding bills from these companies. ° °You will be contacted with the lab results as soon as they are available. The fastest way to get your results is to activate your My Chart account. Instructions are located on the last page of this paperwork. If you have not heard from us regarding the results in 2 weeks, please contact this office. °  ° ° ° °

## 2019-07-27 NOTE — Progress Notes (Signed)
Established Patient Office Visit  Subjective:  Patient ID: Mindy Collins, female    DOB: [birth year]  Age: 49 y.o. MRN: [id number]  CC:  Chief Complaint  Patient presents with  . Medication Refill    medication refil on pended medications. Per patient she has no questions or concerns     HPI Brisa Auth presents for refills  HTN: taking benicar 20-12.5mg  PO qd with good effect. Home bps have been stable wnl. BP today at arrival is great - 123/82  Otherwise, notes that her carpal tunnel has been worsening with increased activity lately. Using her braces which is providing good relief. Sleeping with them on as well.   Lastly, notes she hasn't heard from referral yet, will check on this today  Past Medical History:  Diagnosis Date  . Abdominal wall seroma   . Anemia   . Arthritis   . Asthma   . B12 deficiency   . Back pain   . Carpal tunnel syndrome, bilateral   . Chest pain   . Colitis   . Constipation   . Dysmenorrhea   . Dyspnea   . Fatty liver   . GERD (gastroesophageal reflux disease)   . HLD (hyperlipidemia)   . HPV (human papilloma virus) infection    WARTS/ TCA TX  . Hypertension   . Keratoconus   . Leg edema   . Obesity   . Obesity   . Osteoarthritis   . Palpitations   . Prediabetes   . Seasonal allergies     Past Surgical History:  Procedure Laterality Date  . GYNECOLOGIC CRYOSURGERY  1993   DYSPLASIA CERVIX/   . MYOMECTOMY  02/17/2011   Procedure: MYOMECTOMY;  Surgeon: Terrance Mass, MD;  Location: [facility];  Service: Gynecology;  Laterality: N/A;  I am hoping to get a 7:30am time on Dec 4, Dec 11 or Dec 12. It not available I will check on some 1:00 pm times. Thanks  . MYOMECTOMY ABDOMINAL APPROACH  04/31/2007  . OVARIAN CYST REMOVAL  02/17/2011   Procedure: OVARIAN CYSTECTOMY;  Surgeon: Terrance Mass, MD;  Location: [facility];  Service: Gynecology;  Laterality: Left;    Family History  Problem Relation Age of Onset  . Hypertension Mother   .  Breast cancer Mother   . Cancer Mother   . Hyperlipidemia Mother   . Thyroid disease Mother   . Obesity Mother   . Heart disease Maternal Grandmother   . Cancer Father        LUNG  . Breast cancer Maternal Aunt   . Diabetes Maternal Grandfather   . Cancer Paternal Aunt   . Ovarian cancer Paternal Aunt     Social History   Socioeconomic History  . Marital status: Single    Spouse name: Not on file  . Number of children: 0  . Years of education: Not on file  . Highest education level: Not on file  Occupational History  . Occupation: Sport and exercise psychologist, Environmental consultant college, admin    Employer: [employer]    Comment: [employer]  Tobacco Use  . Smoking status: Never Smoker  . Smokeless tobacco: Never Used  . Tobacco comment: denies tobacco   Vaping Use  . Vaping Use: Never used  Substance and Sexual Activity  . Alcohol use: No    Alcohol/week: 0.0 standard drinks  . Drug use: No  . Sexual activity: Yes    Birth control/protection: I.U.D.  Other Topics Concern  . Not on file  Social  History Narrative   Graduate degree from [employer] in woman/gender studies.   Lives with her mother.   Social Determinants of Health   Financial Resource Strain:   . Difficulty of Paying Living Expenses:   Food Insecurity:   . Worried About Charity fundraiser in the Last Year:   . Arboriculturist in the Last Year:   Transportation Needs:   . Film/video editor (Medical):   Marland Kitchen Lack of Transportation (Non-Medical):   Physical Activity:   . Days of Exercise per Week:   . Minutes of Exercise per Session:   Stress:   . Feeling of Stress :   Social Connections:   . Frequency of Communication with Friends and Family:   . Frequency of Social Gatherings with Friends and Family:   . Attends Religious Services:   . Active Member of Clubs or Organizations:   . Attends Archivist Meetings:   Marland Kitchen Marital Status:   Intimate Partner Violence:   . Fear of Current or Ex-Partner:   .  Emotionally Abused:   Marland Kitchen Physically Abused:   . Sexually Abused:     Outpatient Medications Prior to Visit  Medication Sig Dispense Refill  . acetic acid-hydrocortisone (VOSOL-HC) OTIC solution Place 3 drops into both ears 3 (three) times daily. 10 mL 0  . fluticasone (FLONASE) 50 MCG/ACT nasal spray instill 1 spray into each nostril twice a day  0  . levonorgestrel (MIRENA) 20 MCG/24HR IUD 1 each by Intrauterine route once.    . loratadine (CLARITIN) 10 MG tablet Take 10 mg by mouth daily.    . metFORMIN (GLUCOPHAGE) 500 MG tablet Take 1 tablet (500 mg total) by mouth daily with breakfast. 30 tablet 0  . Vitamin D, Ergocalciferol, (DRISDOL) 1.25 MG (50000 UNIT) CAPS capsule Take 1 capsule (50,000 Units total) by mouth every 3 (three) days. 8 capsule 0  . olmesartan-hydrochlorothiazide (BENICAR HCT) 20-12.5 MG tablet TAKE 1 TABLET BY MOUTH DAILY 90 tablet 0  . meloxicam (MOBIC) 15 MG tablet TAKE 1 TABLET BY MOUTH DAILY AS NEEDED (Patient not taking: Reported on 07/27/2019) 30 tablet 0  . methocarbamol (ROBAXIN) 500 MG tablet Take 1 tablet (500 mg total) by mouth 4 (four) times daily. (Patient not taking: Reported on 07/13/2019) 60 tablet 0  . misoprostol (CYTOTEC) 200 MCG tablet Place 2 tablets (400 mcg total) vaginally once for 1 dose. 2 tab vaginally the everning prior to surgery 2 tablet 0   No facility-administered medications prior to visit.    Allergies  Allergen Reactions  . Esomeprazole Magnesium Shortness Of Breath    Nervousness; tolerates Aciphex.  . Adhesive [Tape] Itching and Rash    Itching and rash with adhesive tape  . Latex Itching and Dermatitis    Skin blistering  . Levocetirizine Dihydrochloride Other (See Comments)    Muscle jerks  . Nexium [Esomeprazole]     ROS Review of Systems  Constitutional: Negative.   HENT: Negative.   Eyes: Negative.   Respiratory: Negative.   Cardiovascular: Negative.   Gastrointestinal: Negative.   Endocrine: Negative.     Genitourinary: Negative.   Musculoskeletal: Positive for arthralgias. Negative for back pain, gait problem, joint swelling, myalgias, neck pain and neck stiffness.  Skin: Negative.   Allergic/Immunologic: Negative.   Neurological: Negative.   Hematological: Negative.   Psychiatric/Behavioral: Negative.   All other systems reviewed and are negative.     Objective:    Physical Exam Vitals and nursing note reviewed.  Constitutional:  General: She is not in acute distress.    Appearance: Normal appearance. She is obese. She is not ill-appearing, toxic-appearing or diaphoretic.  Cardiovascular:     Rate and Rhythm: Normal rate and regular rhythm.     Heart sounds: Normal heart sounds.  Pulmonary:     Effort: Pulmonary effort is normal. No respiratory distress.     Breath sounds: Normal breath sounds.  Neurological:     General: No focal deficit present.     Mental Status: She is alert and oriented to person, place, and time. Mental status is at baseline.  Psychiatric:        Mood and Affect: Mood normal.        Behavior: Behavior normal.        Thought Content: Thought content normal.        Judgment: Judgment normal.     BP 123/82   Pulse 95   Temp 98 F (36.7 C) (Temporal)   Resp 17   Ht 5\' 2"  (1.575 m)   Wt 298 lb 12.8 oz (135.5 kg)   SpO2 95%   BMI 54.65 kg/m  Wt Readings from Last 3 Encounters:  07/27/19 298 lb 12.8 oz (135.5 kg)  07/13/19 299 lb (135.6 kg)  06/28/19 299 lb (135.6 kg)     There are no preventive care reminders to display for this patient.  There are no preventive care reminders to display for this patient.  Lab Results  Component Value Date   TSH 3.190 09/07/2017   Lab Results  Component Value Date   WBC 12.0 (H) 12/21/2017   HGB 13.7 12/21/2017   HCT 42.0 12/21/2017   MCV 81 12/21/2017   PLT 417 (H) 03/05/2017   Lab Results  Component Value Date   NA 145 (H) 07/13/2019   K 3.8 07/13/2019   CO2 28 07/13/2019   GLUCOSE 94  07/13/2019   BUN 14 07/13/2019   CREATININE 0.81 07/13/2019   BILITOT 0.7 07/13/2019   ALKPHOS 121 07/13/2019   AST 52 (H) 07/13/2019   ALT 80 (H) 07/13/2019   PROT 7.6 07/13/2019   ALBUMIN 4.4 07/13/2019   CALCIUM 9.9 07/13/2019   ANIONGAP 6 10/31/2014   Lab Results  Component Value Date   CHOL 239 (H) 02/23/2019   Lab Results  Component Value Date   HDL 43 02/23/2019   Lab Results  Component Value Date   LDLCALC 157 (H) 02/23/2019   Lab Results  Component Value Date   TRIG 211 (H) 02/23/2019   Lab Results  Component Value Date   CHOLHDL 4.6 (H) 03/05/2017   Lab Results  Component Value Date   HGBA1C 5.8 (H) 02/23/2019      Assessment & Plan:   Problem List Items Addressed This Visit    None    Visit Diagnoses    Essential hypertension       Relevant Medications   olmesartan-hydrochlorothiazide (BENICAR HCT) 20-12.5 MG tablet      Meds ordered this encounter  Medications  . olmesartan-hydrochlorothiazide (BENICAR HCT) 20-12.5 MG tablet    Sig: Take 1 tablet by mouth daily.    Dispense:  90 tablet    Refill:  3    Follow-up: No follow-ups on file.   PLAN  Add diclofenac for wrist pain  Refill benicar  Return in 6 mo  Patient encouraged to call clinic with any questions, comments, or concerns.  Maximiano Coss, NP

## 2019-08-02 ENCOUNTER — Other Ambulatory Visit: Payer: Self-pay

## 2019-08-02 ENCOUNTER — Encounter (INDEPENDENT_AMBULATORY_CARE_PROVIDER_SITE_OTHER): Payer: Self-pay | Admitting: Family Medicine

## 2019-08-02 ENCOUNTER — Ambulatory Visit (INDEPENDENT_AMBULATORY_CARE_PROVIDER_SITE_OTHER): Payer: 59 | Admitting: Family Medicine

## 2019-08-02 VITALS — BP 131/85 | HR 95 | Temp 98.0°F | Ht 63.0 in | Wt 295.0 lb

## 2019-08-02 DIAGNOSIS — R7303 Prediabetes: Secondary | ICD-10-CM | POA: Diagnosis not present

## 2019-08-02 DIAGNOSIS — Z6841 Body Mass Index (BMI) 40.0 and over, adult: Secondary | ICD-10-CM

## 2019-08-03 NOTE — Progress Notes (Signed)
Chief Complaint:   OBESITY Mindy Collins is here to discuss her progress with her obesity treatment plan along with follow-up of her obesity related diagnoses. Mindy Collins is on following a lower carbohydrate, vegetable and Mindy Collins protein rich diet plan and states she is following her eating plan approximately 50% of the time. Mindy Collins states she is doing 0 minutes 0 times per week.  Today's visit was #: 22 Starting weight: 280 lbs Starting date: 09/07/2017 Today's weight: 295 lbs Today's date: 08/02/2019 Total lbs lost to date: 0 Total lbs lost since last in-office visit: 4  Interim History: Mindy Collins has done well with weight loss on her Low carbohydrate plan. She felt she was much more mindful of her food choices. She would like to continue this plan for a little longer.  Subjective:   1. Pre-diabetes Mindy Collins is doing well with diet and metformin, and she notes decreased polyphagia. She denies hypoglycemia.  Assessment/Plan:   1. Pre-diabetes Mindy Collins will continue to work on weight loss, diet, exercise, and decreasing simple carbohydrates to help decrease the risk of diabetes. We will recheck labs in 1 month.   2. Class 3 severe obesity with serious comorbidity and body mass index (BMI) of 50.0 to 59.9 in adult, unspecified obesity type (HCC) Mindy Collins is currently in the action stage of change. As such, her goal is to continue with weight loss efforts. She has agreed to following a lower carbohydrate, vegetable and Mindy Collins protein rich diet plan.   Behavioral modification strategies: increasing water intake.  Mindy Collins has agreed to follow-up with our clinic in 3 weeks. She was informed of the importance of frequent follow-up visits to maximize her success with intensive lifestyle modifications for her multiple health conditions.   Objective:   Blood pressure 131/85, pulse 95, temperature 98 F (36.7 C), temperature source Oral, height 5\' 3"  (1.6 m), weight 295 lb (133.8 kg), SpO2 98 %. Body mass index is  52.26 kg/m.  General: Cooperative, alert, well developed, in no acute distress. HEENT: Conjunctivae and lids unremarkable. Cardiovascular: Regular rhythm.  Lungs: Normal work of breathing. Neurologic: No focal deficits.   Lab Results  Component Value Date   CREATININE 0.81 07/13/2019   BUN 14 07/13/2019   NA 145 (H) 07/13/2019   K 3.8 07/13/2019   CL 103 07/13/2019   CO2 28 07/13/2019   Lab Results  Component Value Date   ALT 80 (H) 07/13/2019   AST 52 (H) 07/13/2019   ALKPHOS 121 07/13/2019   BILITOT 0.7 07/13/2019   Lab Results  Component Value Date   HGBA1C 5.8 (H) 02/23/2019   HGBA1C 5.9 (H) 09/01/2018   HGBA1C 5.9 (H) 12/21/2017   HGBA1C 6.0 (H) 09/07/2017   HGBA1C 5.9 (H) 03/05/2017   Lab Results  Component Value Date   INSULIN 3.4 02/23/2019   INSULIN 19.3 09/01/2018   INSULIN 32.3 (H) 12/21/2017   INSULIN 14.8 09/07/2017   Lab Results  Component Value Date   TSH 3.190 09/07/2017   Lab Results  Component Value Date   CHOL 239 (H) 02/23/2019   HDL 43 02/23/2019   LDLCALC 157 (H) 02/23/2019   LDLDIRECT 141.6 04/16/2010   TRIG 211 (H) 02/23/2019   CHOLHDL 4.6 (H) 03/05/2017   Lab Results  Component Value Date   WBC 12.0 (H) 12/21/2017   HGB 13.7 12/21/2017   HCT 42.0 12/21/2017   MCV 81 12/21/2017   PLT 417 (H) 03/05/2017   No results found for: IRON, TIBC, FERRITIN  Attestation Statements:  Reviewed by clinician on [place] of visit: allergies, medications, problem list, medical history, surgical history, family history, social history, and previous encounter notes.  Time spent on visit including pre-visit chart review and post-visit care and charting was 22 minutes.    I, Trixie Dredge, am acting as transcriptionist for Dennard Nip, MD.  I have reviewed the above documentation for accuracy and completeness, and I agree with the above. -  Dennard Nip, MD

## 2019-08-08 ENCOUNTER — Other Ambulatory Visit (INDEPENDENT_AMBULATORY_CARE_PROVIDER_SITE_OTHER): Payer: Self-pay | Admitting: Family Medicine

## 2019-08-08 DIAGNOSIS — I10 Essential (primary) hypertension: Secondary | ICD-10-CM

## 2019-08-09 ENCOUNTER — Telehealth: Payer: Self-pay

## 2019-08-09 ENCOUNTER — Encounter: Payer: Self-pay | Admitting: Anesthesiology

## 2019-08-09 NOTE — Telephone Encounter (Signed)
I spoke with patient about Covid protocol changes. She states she is fully vaccinated having received both doses through the [facility] the [facility].  I cancelled her upcoming Covid test and advised her about the protocol.

## 2019-08-10 ENCOUNTER — Other Ambulatory Visit: Payer: Self-pay

## 2019-08-10 ENCOUNTER — Telehealth (INDEPENDENT_AMBULATORY_CARE_PROVIDER_SITE_OTHER): Payer: 59 | Admitting: Obstetrics & Gynecology

## 2019-08-10 ENCOUNTER — Encounter: Payer: Self-pay | Admitting: Obstetrics & Gynecology

## 2019-08-10 ENCOUNTER — Encounter: Payer: Self-pay | Admitting: Emergency Medicine

## 2019-08-10 ENCOUNTER — Ambulatory Visit: Payer: 59 | Admitting: Emergency Medicine

## 2019-08-10 VITALS — BP 140/75 | HR 95 | Temp 98.1°F | Ht 62.0 in | Wt 299.0 lb

## 2019-08-10 DIAGNOSIS — N882 Stricture and stenosis of cervix uteri: Secondary | ICD-10-CM | POA: Diagnosis not present

## 2019-08-10 DIAGNOSIS — G500 Trigeminal neuralgia: Secondary | ICD-10-CM

## 2019-08-10 DIAGNOSIS — T8332XA Displacement of intrauterine contraceptive device, initial encounter: Secondary | ICD-10-CM | POA: Diagnosis not present

## 2019-08-10 DIAGNOSIS — R519 Headache, unspecified: Secondary | ICD-10-CM

## 2019-08-10 DIAGNOSIS — T8332XD Displacement of intrauterine contraceptive device, subsequent encounter: Secondary | ICD-10-CM

## 2019-08-10 MED ORDER — NORETHINDRONE 0.35 MG PO TABS: 1.0000 | ORAL_TABLET | Freq: Every day | ORAL | 4 refills | Status: DC

## 2019-08-10 MED ORDER — CARBAMAZEPINE 200 MG PO TABS: 200.0000 mg | ORAL_TABLET | Freq: Two times a day (BID) | ORAL | 1 refills | Status: DC

## 2019-08-10 NOTE — Patient Instructions (Addendum)
If you have lab work done today you will be contacted with your lab results within the next 2 weeks.  If you have not heard from [place] then please contact us. The fastest way to get your results is to register for My Chart.   IF you received an x-ray today, you will receive an invoice from [facility]. Please contact [facility] at [phone] with questions or concerns regarding your invoice.   IF you received labwork today, you will receive an invoice from [facility]. Please contact [facility] at [phone] with questions or concerns regarding your invoice.   Our billing staff will not be able to assist you with questions regarding bills from these companies.  You will be contacted with the lab results as soon as they are available. The fastest way to get your results is to activate your My Chart account. Instructions are located on the last page of this paperwork. If you have not heard from [place] regarding the results in 2 weeks, please contact this office.      Trigeminal Neuralgia  Trigeminal neuralgia is a nerve disorder that causes severe pain on one side of the face. The pain may last from a few seconds to several minutes. The pain is usually only on one side of the face. Symptoms may occur for days, weeks, or months and then go away for months or years. The pain may return and be worse than before. What are the causes? This condition is caused by damage or pressure to a nerve in the head that is called the trigeminal nerve. An attack can be triggered by:  Talking.  Chewing.  Putting on makeup.  Washing your face.  Shaving your face.  Brushing your teeth.  Touching your face. What increases the risk? You are more likely to develop this condition if you:  Are 47 years of age or older.  Are female. What are the signs or symptoms? The main symptom of this condition is severe pain in  the:  Jaw.  Lips.  Eyes.  Nose.  Scalp.  Forehead.  Face. The pain may be:  Intense.  Stabbing.  Electric.  Shock-like. How is this diagnosed? This condition is diagnosed with a physical exam. A CT scan or an MRI may be done to rule out other conditions that can cause facial pain. How is this treated? This condition may be treated with:  Avoiding the things that trigger your symptoms.  Taking prescription medicines (anticonvulsants).  Having surgery. This may be done in severe cases if other medical treatment does not provide relief.  Having procedures such as ablation, thermal, or radiation therapy. It may take up to one month for treatment to start relieving the pain. Follow these instructions at home: Managing pain  Learn as much as you can about how to manage your pain. Ask your health care provider if a pain specialist would be helpful.  Consider talking with a mental health care provider (psychologist) about how to cope with the pain.  Consider joining a pain support group. General instructions  Take over-the-counter and prescription medicines only as told by your health care provider.  Avoid the things that trigger your symptoms. It may help to: ? Chew on the unaffected side of your mouth. ? Avoid touching your face. ? Avoid blasts of hot or cold air.  Follow your treatment plan as told by your health care provider. This may include: ? Cognitive or behavioral therapy. ? Gentle, regular exercise. ? Meditation  or yoga. ? Aromatherapy.  Keep all follow-up visits as told by your health care provider. You may need to be monitored closely to make sure treatment is working well for you. Where to find more information  Facial Pain Association: fpa-support.org Contact a health care provider if:  Your medicine is not helping your symptoms.  You have side effects from the medicine used for treatment.  You develop new, unexplained symptoms, such  as: ? Double vision. ? Facial weakness. ? Facial numbness. ? Changes in hearing or balance.  You feel depressed. Get help right away if:  Your pain is severe and is not getting better.  You develop suicidal thoughts. If you ever feel like you may hurt yourself or others, or have thoughts about taking your own life, get help right away. You can go to your nearest emergency department or call:  Your local emergency services (911 in the [place]  A suicide crisis helpline, such as the [employer] at 904 690 9528. This is open 24 hours a day. Summary  Trigeminal neuralgia is a nerve disorder that causes severe pain on one side of the face. The pain may last from a few seconds to several minutes.  This condition is caused by damage or pressure to a nerve in the head that is called the trigeminal nerve.  Treatment may include avoiding the things that trigger your symptoms, taking medicines, or having surgery or procedures. It may take up to one month for treatment to start relieving the pain.  Avoid the things that trigger your symptoms.  Keep all follow-up visits as told by your health care provider. You may need to be monitored closely to make sure treatment is working well for you. This information is not intended to replace advice given to you by your health care provider. Make sure you discuss any questions you have with your health care provider. Document Revised: 12/14/2017 Document Reviewed: 12/14/2017 Elsevier Patient Education  [place].

## 2019-08-10 NOTE — Progress Notes (Signed)
Mindy Collins 49 y.o.   Chief Complaint  Patient presents with  . right side ear pain    off and on for the last 1.5 weeks (pressure sensation & pain on that side)  . possible face nerve pain    face is sensitive     HISTORY OF PRESENT ILLNESS: This is a 49 y.o. female complaining of right facial pain on and off for 1 to 2 weeks.  This morning had excruciating pain episode lasted about 4 hours.  Localized to right side of her face.  No associated symptoms or injuries.  Asymptomatic now. Has history of trigeminal neuralgia years ago. No other complaints or medical concerns today.  HPI   Prior to Admission medications   Medication Sig Start Date End Date Taking? Authorizing Provider  fluticasone (FLONASE) 50 MCG/ACT nasal spray Place 1 spray into both nostrils 2 (two) times daily as needed (allergies.).  01/17/16  Yes [provider]  levonorgestrel (MIRENA) 20 MCG/24HR IUD 1 each by Intrauterine route once.   Yes [provider]  loratadine (CLARITIN) 10 MG tablet Take 10 mg by mouth daily as needed for allergies.    Yes [provider]  meloxicam (MOBIC) 15 MG tablet TAKE 1 TABLET BY MOUTH DAILY AS NEEDED Patient taking differently: Take 15 mg by mouth daily as needed (pain/inflammation.).  03/09/19  Yes Maximiano Coss, NP  metFORMIN (GLUCOPHAGE) 500 MG tablet Take 1 tablet (500 mg total) by mouth daily with breakfast. 06/28/19  Yes Leafy Ro, Caren D, MD  methocarbamol (ROBAXIN) 500 MG tablet Take 1 tablet (500 mg total) by mouth 4 (four) times daily. Patient taking differently: Take 500 mg by mouth 4 (four) times daily as needed for muscle spasms.  04/18/19  Yes Maximiano Coss, NP  misoprostol (CYTOTEC) 200 MCG tablet Place 2 tablets (400 mcg total) vaginally once for 1 dose. 2 tab vaginally the everning prior to surgery 07/21/19 08/07/20 Yes Princess Bruins, MD  olmesartan-hydrochlorothiazide (BENICAR HCT) 20-12.5 MG tablet Take 1 tablet by mouth daily. 07/27/19   Yes Maximiano Coss, NP  Vitamin D, Ergocalciferol, (DRISDOL) 1.25 MG (50000 UNIT) CAPS capsule Take 1 capsule (50,000 Units total) by mouth every 3 (three) days. Patient taking differently: Take 50,000 Units by mouth every 3 (three) days.  06/28/19  Yes Beasley, Caren D, MD  acetic acid-hydrocortisone (VOSOL-HC) OTIC solution Place 3 drops into both ears 3 (three) times daily. Patient not taking: Reported on 08/08/2019 04/22/19   Maximiano Coss, NP    Allergies  Allergen Reactions  . Esomeprazole Magnesium Shortness Of Breath    Nervousness; tolerates Aciphex.  . Adhesive [Tape] Itching and Rash    Itching and rash with adhesive tape  . Latex Itching and Dermatitis    Skin blistering  . Levocetirizine Dihydrochloride Other (See Comments)    Muscle jerks    Patient Active Problem List   Diagnosis Date Noted  . Class 3 severe obesity with serious comorbidity and body mass index (BMI) of 50.0 to 59.9 in adult ([place]) 06/22/2018  . Other fatigue 09/07/2017  . Shortness of breath on exertion 09/07/2017  . Moderate mixed hyperlipidemia not requiring statin therapy 09/07/2017  . Vitamin D deficiency 09/07/2017  . Transaminitis 03/08/2017  . Vitamin B12 deficiency 03/08/2017  . Primary insomnia 03/08/2017  . Prediabetes 10/03/2016  . Hyperlipidemia LDL goal <130 10/03/2016  . Abdominal wall seroma 03/14/2016  . Adnexal mass 03/03/2016  . Thrombocytosis ([place]) 07/31/2014  . IUD (intrauterine device) in place 07/31/2014  . DUB (  dysfunctional uterine bleeding) 07/27/2014  . Intramural leiomyoma of uterus 06/30/2014  . Hydrosalpinx 06/30/2014  . Back pain 05/30/2014  . HYPERTENSION, BENIGN 04/05/2010    Past Medical History:  Diagnosis Date  . Abdominal wall seroma   . Anemia   . Arthritis   . Asthma   . B12 deficiency   . Back pain   . Carpal tunnel syndrome, bilateral   . Chest pain   . Colitis   . Constipation   . Dysmenorrhea   . Dyspnea   . Fatty liver   . GERD  (gastroesophageal reflux disease)   . HLD (hyperlipidemia)   . HPV (human papilloma virus) infection    WARTS/ TCA [place]  . Hypertension   . Keratoconus   . Leg edema   . Obesity   . Obesity   . Osteoarthritis   . Palpitations   . Prediabetes   . Seasonal allergies     Past Surgical History:  Procedure Laterality Date  . GYNECOLOGIC CRYOSURGERY  1993   DYSPLASIA CERVIX/   . MYOMECTOMY  02/17/2011   Procedure: MYOMECTOMY;  Surgeon: Terrance Mass, MD;  Location: [facility];  Service: Gynecology;  Laterality: N/A;  I am hoping to get a 7:30am time on Dec 4, Dec 11 or Dec 12. It not available I will check on some 1:00 pm times. Thanks  . MYOMECTOMY ABDOMINAL APPROACH  04/31/2007  . OVARIAN CYST REMOVAL  02/17/2011   Procedure: OVARIAN CYSTECTOMY;  Surgeon: Terrance Mass, MD;  Location: [facility];  Service: Gynecology;  Laterality: Left;    Social History   Socioeconomic History  . Marital status: Single    Spouse name: Not on file  . Number of children: 0  . Years of education: Not on file  . Highest education level: Not on file  Occupational History  . Occupation: Sport and exercise psychologist, Environmental consultant college, admin    Employer: [employer]    Comment: [employer]  Tobacco Use  . Smoking status: Never Smoker  . Smokeless tobacco: Never Used  . Tobacco comment: denies tobacco   Vaping Use  . Vaping Use: Never used  Substance and Sexual Activity  . Alcohol use: No    Alcohol/week: 0.0 standard drinks  . Drug use: No  . Sexual activity: Yes    Birth control/protection: I.U.D.  Other Topics Concern  . Not on file  Social History Narrative   Graduate degree from [employer] in woman/gender studies.   Lives with her mother.   Social Determinants of Health   Financial Resource Strain:   . Difficulty of Paying Living Expenses:   Food Insecurity:   . Worried About Charity fundraiser in the Last Year:   . Arboriculturist in the Last Year:   Transportation Needs:   . Film/video editor  (Medical):   Marland Kitchen Lack of Transportation (Non-Medical):   Physical Activity:   . Days of Exercise per Week:   . Minutes of Exercise per Session:   Stress:   . Feeling of Stress :   Social Connections:   . Frequency of Communication with Friends and Family:   . Frequency of Social Gatherings with Friends and Family:   . Attends Religious Services:   . Active Member of Clubs or Organizations:   . Attends Archivist Meetings:   Marland Kitchen Marital Status:   Intimate Partner Violence:   . Fear of Current or Ex-Partner:   . Emotionally Abused:   Marland Kitchen Physically Abused:   .  Sexually Abused:     Family History  Problem Relation Age of Onset  . Hypertension Mother   . Breast cancer Mother   . Cancer Mother   . Hyperlipidemia Mother   . Thyroid disease Mother   . Obesity Mother   . Heart disease Maternal Grandmother   . Cancer Father        LUNG  . Breast cancer Maternal Aunt   . Diabetes Maternal Grandfather   . Cancer Paternal Aunt   . Ovarian cancer Paternal Aunt      Review of Systems  Constitutional: Negative.  Negative for chills and fever.  HENT: Negative.  Negative for congestion, ear pain, hearing loss, nosebleeds, sinus pain and sore throat.   Eyes: Negative.  Negative for blurred vision, double vision, photophobia, pain and redness.  Respiratory: Negative.  Negative for cough and shortness of breath.   Cardiovascular: Negative.  Negative for chest pain and palpitations.  Gastrointestinal: Negative.  Negative for abdominal pain, nausea and vomiting.  Genitourinary: Negative.  Negative for dysuria and hematuria.  Skin: Negative.  Negative for rash.  Neurological: Negative.  Negative for dizziness, focal weakness, loss of consciousness and headaches.  All other systems reviewed and are negative.   Today's Vitals   08/10/19 1121  BP: 140/75  Pulse: 95  Temp: 98.1 F (36.7 C)  TempSrc: Temporal  SpO2: 96%  Weight: 299 lb (135.6 kg)  Height: 5\' 2"  (1.575 m)   Body  mass index is 54.69 kg/m.  Physical Exam Vitals reviewed.  Constitutional:      Appearance: Normal appearance. She is obese.  HENT:     Head: Normocephalic.     Right Ear: Tympanic membrane, ear canal and external ear normal.     Left Ear: Tympanic membrane, ear canal and external ear normal.     Nose: Nose normal.     Mouth/Throat:     Mouth: Mucous membranes are moist.     Pharynx: Oropharynx is clear. No oropharyngeal exudate or posterior oropharyngeal erythema.  Eyes:     Extraocular Movements: Extraocular movements intact.     Conjunctiva/sclera: Conjunctivae normal.     Pupils: Pupils are equal, round, and reactive to light.  Cardiovascular:     Rate and Rhythm: Normal rate and regular rhythm.     Pulses: Normal pulses.     Heart sounds: Normal heart sounds.  Pulmonary:     Effort: Pulmonary effort is normal.     Breath sounds: Normal breath sounds.  Musculoskeletal:     Cervical back: Normal range of motion and neck supple.  Skin:    General: Skin is warm and dry.     Capillary Refill: Capillary refill takes less than 2 seconds.  Neurological:     General: No focal deficit present.     Mental Status: She is alert and oriented to person, place, and time.     Sensory: No sensory deficit.     Motor: No weakness.     Gait: Gait normal.  Psychiatric:        Mood and Affect: Mood normal.        Behavior: Behavior normal.      ASSESSMENT & PLAN: Dolora was seen today for right side ear pain and possible face nerve pain.  Diagnoses and all orders for this visit:  Acute facial pain  Trigeminal neuralgia of right side of face -     carbamazepine (TEGRETOL) 200 MG tablet; Take 1 tablet (200 mg total) by mouth  2 (two) times daily for 14 days.    Patient Instructions       If you have lab work done today you will be contacted with your lab results within the next 2 weeks.  If you have not heard from [place] then please contact us. The fastest way to get your results  is to register for My Chart.   IF you received an x-ray today, you will receive an invoice from [facility]. Please contact [facility] at [phone] with questions or concerns regarding your invoice.   IF you received labwork today, you will receive an invoice from [facility]. Please contact [facility] at [phone] with questions or concerns regarding your invoice.   Our billing staff will not be able to assist you with questions regarding bills from these companies.  You will be contacted with the lab results as soon as they are available. The fastest way to get your results is to activate your My Chart account. Instructions are located on the last page of this paperwork. If you have not heard from [place] regarding the results in 2 weeks, please contact this office.      Trigeminal Neuralgia  Trigeminal neuralgia is a nerve disorder that causes severe pain on one side of the face. The pain may last from a few seconds to several minutes. The pain is usually only on one side of the face. Symptoms may occur for days, weeks, or months and then go away for months or years. The pain may return and be worse than before. What are the causes? This condition is caused by damage or pressure to a nerve in the head that is called the trigeminal nerve. An attack can be triggered by:  Talking.  Chewing.  Putting on makeup.  Washing your face.  Shaving your face.  Brushing your teeth.  Touching your face. What increases the risk? You are more likely to develop this condition if you:  Are 28 years of age or older.  Are female. What are the signs or symptoms? The main symptom of this condition is severe pain in the:  Jaw.  Lips.  Eyes.  Nose.  Scalp.  Forehead.  Face. The pain may be:  Intense.  Stabbing.  Electric.  Shock-like. How is this diagnosed? This condition is diagnosed with a physical exam. A CT scan or an MRI may be done to rule out other  conditions that can cause facial pain. How is this treated? This condition may be treated with:  Avoiding the things that trigger your symptoms.  Taking prescription medicines (anticonvulsants).  Having surgery. This may be done in severe cases if other medical treatment does not provide relief.  Having procedures such as ablation, thermal, or radiation therapy. It may take up to one month for treatment to start relieving the pain. Follow these instructions at home: Managing pain  Learn as much as you can about how to manage your pain. Ask your health care provider if a pain specialist would be helpful.  Consider talking with a mental health care provider (psychologist) about how to cope with the pain.  Consider joining a pain support group. General instructions  Take over-the-counter and prescription medicines only as told by your health care provider.  Avoid the things that trigger your symptoms. It may help to: ? Chew on the unaffected side of your mouth. ? Avoid touching your face. ? Avoid blasts of hot or cold air.  Follow your treatment plan as told by your health  care provider. This may include: ? Cognitive or behavioral therapy. ? Gentle, regular exercise. ? Meditation or yoga. ? Aromatherapy.  Keep all follow-up visits as told by your health care provider. You may need to be monitored closely to make sure treatment is working well for you. Where to find more information  Facial Pain Association: fpa-support.org Contact a health care provider if:  Your medicine is not helping your symptoms.  You have side effects from the medicine used for treatment.  You develop new, unexplained symptoms, such as: ? Double vision. ? Facial weakness. ? Facial numbness. ? Changes in hearing or balance.  You feel depressed. Get help right away if:  Your pain is severe and is not getting better.  You develop suicidal thoughts. If you ever feel like you may hurt yourself  or others, or have thoughts about taking your own life, get help right away. You can go to your nearest emergency department or call:  Your local emergency services (911 in the [place]  A suicide crisis helpline, such as the [facility] at 770-331-1736. This is open 24 hours a day. Summary  Trigeminal neuralgia is a nerve disorder that causes severe pain on one side of the face. The pain may last from a few seconds to several minutes.  This condition is caused by damage or pressure to a nerve in the head that is called the trigeminal nerve.  Treatment may include avoiding the things that trigger your symptoms, taking medicines, or having surgery or procedures. It may take up to one month for treatment to start relieving the pain.  Avoid the things that trigger your symptoms.  Keep all follow-up visits as told by your health care provider. You may need to be monitored closely to make sure treatment is working well for you. This information is not intended to replace advice given to you by your health care provider. Make sure you discuss any questions you have with your health care provider. Document Revised: 12/14/2017 Document Reviewed: 12/14/2017 Elsevier Patient Education  2020 Elsevier Inc.      Agustina Caroli, MD [facility]

## 2019-08-10 NOTE — Progress Notes (Signed)
    Jaimy Kliethermes January 29, 1971 [id number]        49 y.o.  G0  Preop by phone.  Verbal informed consent obtained.  Patient well identified.  Counseling on surgery for 20 minutes.  RP: Preop HSC/Dilation/Removal of IUD for lost strings/Cervical Stenosis  HPI: IUD confirmed to be intra uterine, but strings lost.  Cervical stenosis prevented removal of IUD under ultrasound guidance.   OB History  Gravida Para Term Preterm AB Living  0            SAB TAB Ectopic Multiple Live Births               Past medical history,surgical history, problem list, medications, allergies, family history and social history were all reviewed and documented in the EPIC chart.   Directed ROS with pertinent positives and negatives documented in the history of present illness/assessment and plan.  Exam:  There were no vitals filed for this visit. General appearance:  Normal  Televisit  Pelvic US/attempted removal of IUD 07/21/2019: Comparison is made with previous scan February 2020. Both transabdominal and transvaginal techniques were necessary to evaluate the anatomy. Anteverted uterus with several small intramural fibroids with no significant change since previous scan. The uterus is measured at 9.5 x 5.65 x 4.64 cm. The IUD is noted in proper position within the uterine cavity. Both ovaries are small with atrophic appearance, best seen abdominally. Mid pelvis cystic mass with septation is again noted with no significant change in size or appearance since previous scan, probable hydrosalpinx. Measured today at 12 x 7 x 4 cm.  Speculum inserted in the vagina. The IUD strings are not visible at the external os. Anterior lip of the cervix grasped with a tenaculum. Betadine prep done on cervix. Hurricaine spray on cervix. The cervix is very stenotic and the os finder cannot be inserted. We therefore use the very small dilator set. Even with the dilator set we do not reach the intra uterine cavity. The os  finder is attempted once more without success. The patient is experiencing significant discomfort and pain. The decision is therefore taken to proceed under anesthesia at [facility] surgical center after softening the cervix with Cytotec. The procedure is therefore stopped at this point. Unsuccessful attempt at IUD removal under ultrasound guidance due to stenotic cervix.   Assessment/Plan:  49 y.o. G0P0   1. Intrauterine contraceptive device threads lost, subsequent encounter Time to remove Mirena IUD.  Strings lost.  Unsuccessful attempt to remove the IUD under ultrasound guidance, cervical stenosis prevented success.  Decision to stop and proceed at [facility] surgical center.  We will give Cytotec vaginally the night before the procedure.  Hysteroscopy dilation and removal of IUD.  Surgery and risks thoroughly reviewed with patient.  Preop and postop care and precautions reviewed.  Started on the progestin only birth control pills.  2. Stenosis of cervix uteri Cytotec preparation the night before surgery.  Other orders - norethindrone (MICRONOR) 0.35 MG tablet; Take 1 tablet (0.35 mg total) by mouth daily.  Princess Bruins MD, 3:14 PM 08/10/2019

## 2019-08-11 NOTE — Pre-Procedure Instructions (Signed)
Your procedure is scheduled on Thursday, July 8 from 08:30 AM- 09:45 AM .  Report to [facility] Main Entrance "A" at 06:30 A.M., and check in at the Admitting office.  Call this number if you have problems the morning of surgery:  [phone]  Call [phone] if you have any questions prior to your surgery date Monday-Friday 8am-4pm.    Remember:  Do not eat after midnight the night before your surgery.  You may drink clear liquids until 05:30 AM the morning of your surgery.   Clear liquids allowed are: Water, Non-Citrus Juices (without pulp), Carbonated Beverages, Clear Tea, Black Coffee Only, and Gatorade.    Take these medicines the morning of surgery with A SIP OF WATER: norethindrone (MICRONOR) carbamazepine (TEGRETOL)   IF NEEDED: loratadine (CLARITIN) methocarbamol (ROBAXIN) fluticasone (FLONASE) nasal spray  As of today, STOP taking any Aspirin (unless otherwise instructed by your surgeon) and Aspirin containing products, meloxicam (MOBIC), Aleve, Naproxen, Ibuprofen, Motrin, Advil, Goody's, BC's, all herbal medications, fish oil, and all vitamins.   WHAT DO I DO ABOUT MY DIABETES MEDICATION?  Marland Kitchen Do not take metFORMIN (GLUCOPHAGE) the morning of surgery.   HOW TO MANAGE YOUR DIABETES BEFORE AND AFTER SURGERY  Why is it important to control my blood sugar before and after surgery? . Improving blood sugar levels before and after surgery helps healing and can limit problems. . A way of improving blood sugar control is eating a healthy diet by: o  Eating less sugar and carbohydrates o  Increasing activity/exercise o  Talking with your doctor about reaching your blood sugar goals . High blood sugars (greater than 180 mg/dL) can raise your risk of infections and slow your recovery, so you will need to focus on controlling your diabetes during the weeks before surgery. . Make sure that the doctor who takes care of your diabetes knows about your planned surgery including  the date and location.  How do I manage my blood sugar before surgery? . Check your blood sugar at least 4 times a day, starting 2 days before surgery, to make sure that the level is not too high or low. . Check your blood sugar the morning of your surgery when you wake up and every 2 hours until you get to the Short Stay unit. o If your blood sugar is less than 70 mg/dL, you will need to treat for low blood sugar: - Treat a low blood sugar (less than 70 mg/dL) with  cup of clear juice (cranberry or apple), 4 glucose tablets, OR glucose gel. - Recheck blood sugar in 15 minutes after treatment (to make sure it is greater than 70 mg/dL). If your blood sugar is not greater than 70 mg/dL on recheck, call [phone] for further instructions. . Report your blood sugar to the short stay nurse when you get to Short Stay.  . If you are admitted to the hospital after surgery: o Your blood sugar will be checked by the staff and you will probably be given insulin after surgery (instead of oral diabetes medicines) to make sure you have good blood sugar levels. o The goal for blood sugar control after surgery is 80-180 mg/dL.         The Morning of Surgery:              Do not wear jewelry, make up, or nail polish.            Do not wear lotions, powders, perfumes, or deodorant.  Do not shave 48 hours prior to surgery.             Do not bring valuables to the hospital.            [facility] is not responsible for any belongings or valuables.  Do NOT Smoke (Tobacco/Vapping) or drink Alcohol 24 hours prior to your procedure.  If you use a CPAP at night, you may bring all equipment for your overnight stay.   Contacts, glasses, dentures or bridgework may not be worn into surgery.      For patients admitted to the hospital, discharge time will be determined by your treatment team.   Patients discharged the day of surgery will not be allowed to drive home, and someone needs to stay with them  for 24 hours.    Special instructions:   [facility]- Preparing For Surgery  Before surgery, you can play an important role. Because skin is not sterile, your skin needs to be as free of germs as possible. You can reduce the number of germs on your skin by washing with CHG (chlorahexidine gluconate) Soap before surgery.  CHG is an antiseptic cleaner which kills germs and bonds with the skin to continue killing germs even after washing.    Oral Hygiene is also important to reduce your risk of infection.  Remember - BRUSH YOUR TEETH THE MORNING OF SURGERY WITH YOUR REGULAR TOOTHPASTE  Please do not use if you have an allergy to CHG or antibacterial soaps. If your skin becomes reddened/irritated stop using the CHG.  Do not shave (including legs and underarms) for at least 48 hours prior to first CHG shower. It is OK to shave your face.  Please follow these instructions carefully.   1. Shower the NIGHT BEFORE SURGERY and the MORNING OF SURGERY with CHG Soap.   2. If you chose to wash your hair, wash your hair first as usual with your normal shampoo.  3. After you shampoo, rinse your hair and body thoroughly to remove the shampoo.  4. Use CHG as you would any other liquid soap. You can apply CHG directly to the skin and wash gently with a scrungie or a clean washcloth.   5. Apply the CHG Soap to your body ONLY FROM THE NECK DOWN.  Do not use on open wounds or open sores. Avoid contact with your eyes, ears, mouth and genitals (private parts). Wash Face and genitals (private parts)  with your normal soap.   6. Wash thoroughly, paying special attention to the area where your surgery will be performed.  7. Thoroughly rinse your body with warm water from the neck down.  8. DO NOT shower/wash with your normal soap after using and rinsing off the CHG Soap.  9. Pat yourself dry with a CLEAN TOWEL.  10. Wear CLEAN PAJAMAS to bed the night before surgery, wear comfortable clothes the morning of  surgery  11. Place CLEAN SHEETS on your bed the night of your first shower and DO NOT SLEEP WITH PETS.   Day of Surgery: Shower with CHG Soap.   Do not apply any deodorants/lotions.  Please wear clean clothes to the hospital/surgery center.   Remember to brush your teeth WITH YOUR REGULAR TOOTHPASTE.   Please read over the following fact sheets that you were given.

## 2019-08-12 ENCOUNTER — Encounter (HOSPITAL_COMMUNITY): Payer: Self-pay

## 2019-08-12 ENCOUNTER — Other Ambulatory Visit: Payer: Self-pay

## 2019-08-12 ENCOUNTER — Encounter (HOSPITAL_COMMUNITY)
Admission: RE | Admit: 2019-08-12 | Discharge: 2019-08-12 | Disposition: A | Discharge status: Home / Self Care | Payer: 59 | Source: Ambulatory Visit | Attending: Obstetrics & Gynecology | Admitting: Obstetrics & Gynecology

## 2019-08-12 DIAGNOSIS — Z01818 Encounter for other preprocedural examination: Secondary | ICD-10-CM | POA: Insufficient documentation

## 2019-08-12 HISTORY — DX: Sleep apnea, unspecified: G47.30

## 2019-08-12 HISTORY — DX: Prediabetes: R73.03

## 2019-08-12 LAB — CBC
HCT: 43 % (ref 36.0–46.0)
Hemoglobin: 13.4 g/dL (ref 12.0–15.0)
MCH: 26.7 pg (ref 26.0–34.0)
MCHC: 31.2 g/dL (ref 30.0–36.0)
MCV: 85.8 fL (ref 80.0–100.0)
Platelets: 430 10*3/uL — ABNORMAL HIGH (ref 150–400)
RBC: 5.01 MIL/uL (ref 3.87–5.11)
RDW: 14.9 % (ref 11.5–15.5)
WBC: 10.5 10*3/uL (ref 4.0–10.5)
nRBC: 0 % (ref 0.0–0.2)

## 2019-08-12 LAB — BASIC METABOLIC PANEL
Anion gap: 11 (ref 5–15)
BUN: 11 mg/dL (ref 6–20)
CO2: 29 mmol/L (ref 22–32)
Calcium: 9.3 mg/dL (ref 8.9–10.3)
Chloride: 103 mmol/L (ref 98–111)
Creatinine, Ser: 0.81 mg/dL (ref 0.44–1.00)
GFR calc Af Amer: >60 mL/min (ref >60)
GFR calc non Af Amer: >60 mL/min (ref >60)
Glucose, Bld: 113 mg/dL — ABNORMAL HIGH (ref 70–99)
Potassium: 3.4 mmol/L — ABNORMAL LOW (ref 3.5–5.1)
Sodium: 143 mmol/L (ref 135–145)

## 2019-08-12 LAB — GLUCOSE, CAPILLARY: Glucose-Capillary: 108 mg/dL — ABNORMAL HIGH (ref 70–99)

## 2019-08-12 LAB — HEMOGLOBIN A1C
Hgb A1c MFr Bld: 6 % — ABNORMAL HIGH (ref 4.8–5.6)
Mean Plasma Glucose: 125.5 mg/dL

## 2019-08-12 NOTE — Progress Notes (Signed)
PCP - Maximiano Coss, NP Cardiologist - pt denies   Chest x-ray - n/a EKG - 08/12/19 Stress Test - 04/18/10  ECHO - 04/18/19 Cardiac Cath - pt denies  Sleep Study -  CPAP - yes  Fasting Blood Sugar - per pt, she is pre-diabetic and does not check CBG at home. No CBG meter.    Blood Thinner Instructions:n/a Aspirin Instructions:n/a  ERAS Protcol - yes PRE-SURGERY Ensure or G2- no drink ordered  COVID TEST- 08/16/19 @1515   Coronavirus Screening  Have you experienced the following symptoms:  Cough yes/no: No Fever (>100.44F)  yes/no: No Runny nose yes/no: No Sore throat yes/no: No Difficulty breathing/shortness of breath  yes/no: No  Have you or a family member traveled in the last 14 days and where? yes/no: No   If the patient indicates "YES" to the above questions, their PAT will be rescheduled to limit the exposure to others and, the surgeon will be notified. THE PATIENT WILL NEED TO BE ASYMPTOMATIC FOR 14 DAYS.   If the patient is not experiencing any of these symptoms, the PAT nurse will instruct them to NOT bring anyone with them to their appointment since they may have these symptoms or traveled as well.   Please remind your patients and families that hospital visitation restrictions are in effect and the importance of the restrictions.    Anesthesia review: n/a    Patient denies shortness of breath, fever, cough and chest pain at PAT appointment   All instructions explained to the patient, with a verbal understanding of the material. Patient agrees to go over the instructions while at home for a better understanding. Patient also instructed to self quarantine after being tested for COVID-19. The opportunity to ask questions was provided.

## 2019-08-13 ENCOUNTER — Encounter: Payer: Self-pay | Admitting: Obstetrics & Gynecology

## 2019-08-13 NOTE — Patient Instructions (Signed)
1. Intrauterine contraceptive device threads lost, subsequent encounter Time to remove Mirena IUD.  Strings lost.  Unsuccessful attempt to remove the IUD under ultrasound guidance, cervical stenosis prevented success.  Decision to stop and proceed at [facility] surgical center.  We will give Cytotec vaginally the night before the procedure.  Hysteroscopy dilation and removal of IUD.  Surgery and risks thoroughly reviewed with patient.  Preop and postop care and precautions reviewed.  Started on the progestin only birth control pills.  2. Stenosis of cervix uteri Cytotec preparation the night before surgery.  Other orders - norethindrone (MICRONOR) 0.35 MG tablet; Take 1 tablet (0.35 mg total) by mouth daily.  Mindy Collins, it was a pleasure seeing you today!

## 2019-08-14 ENCOUNTER — Other Ambulatory Visit (INDEPENDENT_AMBULATORY_CARE_PROVIDER_SITE_OTHER): Payer: Self-pay | Admitting: Family Medicine

## 2019-08-14 DIAGNOSIS — E559 Vitamin D deficiency, unspecified: Secondary | ICD-10-CM

## 2019-08-16 ENCOUNTER — Other Ambulatory Visit (HOSPITAL_COMMUNITY)
Admission: RE | Admit: 2019-08-16 | Discharge: 2019-08-16 | Disposition: A | Discharge status: Home / Self Care | Payer: 59 | Source: Ambulatory Visit | Attending: Obstetrics & Gynecology | Admitting: Obstetrics & Gynecology

## 2019-08-16 DIAGNOSIS — Z20822 Contact with and (suspected) exposure to covid-19: Secondary | ICD-10-CM | POA: Insufficient documentation

## 2019-08-16 DIAGNOSIS — Z01812 Encounter for preprocedural laboratory examination: Secondary | ICD-10-CM | POA: Diagnosis present

## 2019-08-16 LAB — SARS CORONAVIRUS 2 (TAT 6-24 HRS): SARS Coronavirus 2: NEGATIVE

## 2019-08-17 ENCOUNTER — Encounter (INDEPENDENT_AMBULATORY_CARE_PROVIDER_SITE_OTHER): Payer: Self-pay

## 2019-08-17 MED ORDER — DEXTROSE 5 % IV SOLN
3.0000 g | INTRAVENOUS | Status: AC
  Administered 2019-08-18: 3 g via INTRAVENOUS
  Filled 2019-08-17: qty 3

## 2019-08-18 ENCOUNTER — Encounter (HOSPITAL_COMMUNITY): Payer: Self-pay | Admitting: Obstetrics & Gynecology

## 2019-08-18 ENCOUNTER — Ambulatory Visit (HOSPITAL_COMMUNITY): Payer: 59 | Admitting: Certified Registered Nurse Anesthetist

## 2019-08-18 ENCOUNTER — Ambulatory Visit (HOSPITAL_COMMUNITY): Payer: 59

## 2019-08-18 ENCOUNTER — Ambulatory Visit (HOSPITAL_COMMUNITY): Payer: 59 | Admitting: Vascular Surgery

## 2019-08-18 ENCOUNTER — Other Ambulatory Visit: Payer: Self-pay

## 2019-08-18 ENCOUNTER — Encounter (HOSPITAL_COMMUNITY)
Admission: RE | Disposition: A | Discharge status: Home / Self Care | Payer: Self-pay | Source: Home / Self Care | Attending: Obstetrics & Gynecology

## 2019-08-18 ENCOUNTER — Ambulatory Visit (HOSPITAL_COMMUNITY)
Admission: RE | Admit: 2019-08-18 | Discharge: 2019-08-18 | Disposition: A | Discharge status: Home / Self Care | Payer: 59 | Attending: Obstetrics & Gynecology | Admitting: Obstetrics & Gynecology

## 2019-08-18 DIAGNOSIS — M1990 Unspecified osteoarthritis, unspecified site: Secondary | ICD-10-CM | POA: Insufficient documentation

## 2019-08-18 DIAGNOSIS — T8332XA Displacement of intrauterine contraceptive device, initial encounter: Secondary | ICD-10-CM | POA: Insufficient documentation

## 2019-08-18 DIAGNOSIS — N854 Malposition of uterus: Secondary | ICD-10-CM | POA: Diagnosis not present

## 2019-08-18 DIAGNOSIS — Z6841 Body Mass Index (BMI) 40.0 and over, adult: Secondary | ICD-10-CM | POA: Diagnosis not present

## 2019-08-18 DIAGNOSIS — Z791 Long term (current) use of non-steroidal anti-inflammatories (NSAID): Secondary | ICD-10-CM | POA: Diagnosis not present

## 2019-08-18 DIAGNOSIS — J45909 Unspecified asthma, uncomplicated: Secondary | ICD-10-CM | POA: Diagnosis not present

## 2019-08-18 DIAGNOSIS — G4730 Sleep apnea, unspecified: Secondary | ICD-10-CM | POA: Insufficient documentation

## 2019-08-18 DIAGNOSIS — Z79899 Other long term (current) drug therapy: Secondary | ICD-10-CM | POA: Diagnosis not present

## 2019-08-18 DIAGNOSIS — K219 Gastro-esophageal reflux disease without esophagitis: Secondary | ICD-10-CM | POA: Insufficient documentation

## 2019-08-18 DIAGNOSIS — Z419 Encounter for procedure for purposes other than remedying health state, unspecified: Secondary | ICD-10-CM

## 2019-08-18 DIAGNOSIS — N882 Stricture and stenosis of cervix uteri: Secondary | ICD-10-CM | POA: Insufficient documentation

## 2019-08-18 DIAGNOSIS — E119 Type 2 diabetes mellitus without complications: Secondary | ICD-10-CM | POA: Diagnosis not present

## 2019-08-18 DIAGNOSIS — Z7984 Long term (current) use of oral hypoglycemic drugs: Secondary | ICD-10-CM | POA: Insufficient documentation

## 2019-08-18 DIAGNOSIS — K760 Fatty (change of) liver, not elsewhere classified: Secondary | ICD-10-CM | POA: Insufficient documentation

## 2019-08-18 DIAGNOSIS — Y768 Miscellaneous obstetric and gynecological devices associated with adverse incidents, not elsewhere classified: Secondary | ICD-10-CM | POA: Diagnosis not present

## 2019-08-18 DIAGNOSIS — I10 Essential (primary) hypertension: Secondary | ICD-10-CM | POA: Insufficient documentation

## 2019-08-18 HISTORY — PX: HYSTEROSCOPY: SHX211

## 2019-08-18 LAB — GLUCOSE, CAPILLARY: Glucose-Capillary: 129 mg/dL — ABNORMAL HIGH (ref 70–99)

## 2019-08-18 LAB — POCT PREGNANCY, URINE: Preg Test, Ur: NEGATIVE

## 2019-08-18 SURGERY — HYSTEROSCOPY
Anesthesia: General | Site: Vagina

## 2019-08-18 MED ORDER — SCOPOLAMINE 1 MG/3DAYS TD PT72
MEDICATED_PATCH | TRANSDERMAL | Status: AC
  Filled 2019-08-18: qty 1

## 2019-08-18 MED ORDER — FENTANYL CITRATE (PF) 100 MCG/2ML IJ SOLN
INTRAMUSCULAR | Status: DC | PRN
  Administered 2019-08-18 (×2): 50 ug via INTRAVENOUS
  Administered 2019-08-18: 25 ug via INTRAVENOUS

## 2019-08-18 MED ORDER — ONDANSETRON HCL 4 MG/2ML IJ SOLN: 4.0000 mg | Freq: Once | INTRAMUSCULAR | Status: DC | PRN

## 2019-08-18 MED ORDER — PROPOFOL 10 MG/ML IV BOLUS
INTRAVENOUS | Status: DC | PRN
  Administered 2019-08-18: 200 mg via INTRAVENOUS

## 2019-08-18 MED ORDER — DEXAMETHASONE SODIUM PHOSPHATE 10 MG/ML IJ SOLN
INTRAMUSCULAR | Status: AC
  Filled 2019-08-18: qty 1

## 2019-08-18 MED ORDER — LIDOCAINE 2% (20 MG/ML) 5 ML SYRINGE
INTRAMUSCULAR | Status: AC
  Filled 2019-08-18: qty 5

## 2019-08-18 MED ORDER — POVIDONE-IODINE 10 % EX SWAB
2.0000 "application " | Freq: Once | CUTANEOUS | Status: AC
  Administered 2019-08-18: 2 via TOPICAL

## 2019-08-18 MED ORDER — MIDAZOLAM HCL 2 MG/2ML IJ SOLN
INTRAMUSCULAR | Status: AC
  Filled 2019-08-18: qty 2

## 2019-08-18 MED ORDER — PHENYLEPHRINE 40 MCG/ML (10ML) SYRINGE FOR IV PUSH (FOR BLOOD PRESSURE SUPPORT)
PREFILLED_SYRINGE | INTRAVENOUS | Status: DC | PRN
  Administered 2019-08-18 (×2): 80 ug via INTRAVENOUS

## 2019-08-18 MED ORDER — PHENYLEPHRINE 40 MCG/ML (10ML) SYRINGE FOR IV PUSH (FOR BLOOD PRESSURE SUPPORT)
PREFILLED_SYRINGE | INTRAVENOUS | Status: AC
  Filled 2019-08-18: qty 10

## 2019-08-18 MED ORDER — CHLOROPROCAINE HCL 1 % IJ SOLN
INTRAMUSCULAR | Status: AC
  Filled 2019-08-18: qty 30

## 2019-08-18 MED ORDER — ORAL CARE MOUTH RINSE: 15.0000 mL | Freq: Once | OROMUCOSAL | Status: AC

## 2019-08-18 MED ORDER — OXYCODONE HCL 5 MG PO TABS: 5.0000 mg | ORAL_TABLET | Freq: Once | ORAL | Status: DC | PRN

## 2019-08-18 MED ORDER — LIDOCAINE 2% (20 MG/ML) 5 ML SYRINGE
INTRAMUSCULAR | Status: DC | PRN
  Administered 2019-08-18: 80 mg via INTRAVENOUS

## 2019-08-18 MED ORDER — DEXAMETHASONE SODIUM PHOSPHATE 10 MG/ML IJ SOLN
INTRAMUSCULAR | Status: DC | PRN
  Administered 2019-08-18: 8 mg via INTRAVENOUS

## 2019-08-18 MED ORDER — SODIUM CHLORIDE 0.9 % IR SOLN
Status: DC | PRN
  Administered 2019-08-18 (×2): 3000 mL

## 2019-08-18 MED ORDER — CHLORHEXIDINE GLUCONATE 0.12 % MT SOLN
OROMUCOSAL | Status: AC
  Administered 2019-08-18: 15 mL via OROMUCOSAL
  Filled 2019-08-18: qty 15

## 2019-08-18 MED ORDER — FENTANYL CITRATE (PF) 100 MCG/2ML IJ SOLN
INTRAMUSCULAR | Status: AC
  Filled 2019-08-18: qty 2

## 2019-08-18 MED ORDER — FENTANYL CITRATE (PF) 100 MCG/2ML IJ SOLN
25.0000 ug | INTRAMUSCULAR | Status: DC | PRN
  Administered 2019-08-18: 25 ug via INTRAVENOUS

## 2019-08-18 MED ORDER — ONDANSETRON HCL 4 MG/2ML IJ SOLN
INTRAMUSCULAR | Status: DC | PRN
  Administered 2019-08-18: 4 mg via INTRAVENOUS

## 2019-08-18 MED ORDER — LACTATED RINGERS IV SOLN: INTRAVENOUS | Status: DC

## 2019-08-18 MED ORDER — SCOPOLAMINE 1 MG/3DAYS TD PT72
MEDICATED_PATCH | TRANSDERMAL | Status: DC | PRN
  Administered 2019-08-18: 1 via TRANSDERMAL

## 2019-08-18 MED ORDER — MEPERIDINE HCL 25 MG/ML IJ SOLN: 6.2500 mg | INTRAMUSCULAR | Status: DC | PRN

## 2019-08-18 MED ORDER — CHLORHEXIDINE GLUCONATE 0.12 % MT SOLN: 15.0000 mL | Freq: Once | OROMUCOSAL | Status: AC

## 2019-08-18 MED ORDER — OXYCODONE HCL 5 MG/5ML PO SOLN: 5.0000 mg | Freq: Once | ORAL | Status: DC | PRN

## 2019-08-18 MED ORDER — MIDAZOLAM HCL 2 MG/2ML IJ SOLN
INTRAMUSCULAR | Status: DC | PRN
  Administered 2019-08-18: 2 mg via INTRAVENOUS

## 2019-08-18 MED ORDER — FENTANYL CITRATE (PF) 250 MCG/5ML IJ SOLN
INTRAMUSCULAR | Status: AC
  Filled 2019-08-18: qty 5

## 2019-08-18 MED ORDER — ONDANSETRON HCL 4 MG/2ML IJ SOLN
INTRAMUSCULAR | Status: AC
  Filled 2019-08-18: qty 2

## 2019-08-18 MED ORDER — PROPOFOL 1000 MG/100ML IV EMUL
INTRAVENOUS | Status: AC
  Filled 2019-08-18: qty 100

## 2019-08-18 MED ORDER — CHLOROPROCAINE HCL 1 % IJ SOLN
INTRAMUSCULAR | Status: DC | PRN
  Administered 2019-08-18: 20 mL

## 2019-08-18 MED ORDER — ROCURONIUM BROMIDE 10 MG/ML (PF) SYRINGE
PREFILLED_SYRINGE | INTRAVENOUS | Status: AC
  Filled 2019-08-18: qty 10

## 2019-08-18 MED ORDER — SUCCINYLCHOLINE CHLORIDE 200 MG/10ML IV SOSY
PREFILLED_SYRINGE | INTRAVENOUS | Status: AC
  Filled 2019-08-18: qty 10

## 2019-08-18 MED ORDER — PROPOFOL 10 MG/ML IV BOLUS
INTRAVENOUS | Status: AC
  Filled 2019-08-18: qty 20

## 2019-08-18 SURGICAL SUPPLY — 12 items
CATH FOLEY 2W 5CC 18FR LF (CATHETERS) ×1 IMPLANT
CATH ROBINSON RED A/P 16FR (CATHETERS) ×2 IMPLANT
DILATOR CANAL MILEX (MISCELLANEOUS) ×1 IMPLANT
GLOVE BIO SURGEON STRL SZ 6.5 (GLOVE) ×2 IMPLANT
GLOVE BIOGEL PI IND STRL 7.0 (GLOVE) ×2 IMPLANT
GLOVE BIOGEL PI INDICATOR 7.0 (GLOVE) ×2
GOWN STRL REUS W/ TWL LRG LVL3 (GOWN DISPOSABLE) ×2 IMPLANT
GOWN STRL REUS W/TWL LRG LVL3 (GOWN DISPOSABLE) ×4
KIT PROCEDURE FLUENT (KITS) ×2 IMPLANT
PACK VAGINAL MINOR WOMEN LF (CUSTOM PROCEDURE TRAY) ×2 IMPLANT
PAD OB MATERNITY 4.3X12.25 (PERSONAL CARE ITEMS) ×2 IMPLANT
TOWEL GREEN STERILE FF (TOWEL DISPOSABLE) ×4 IMPLANT

## 2019-08-18 NOTE — Op Note (Signed)
Operative Note  08/18/2019  10:04 AM  PATIENT:  Mindy Collins  49 y.o. female  PRE-OPERATIVE DIAGNOSIS:  IUD strings lost/stenosis of cervix  POST-OPERATIVE DIAGNOSIS:  Severe stenosis of cervix, unsuccessful dilation of cervix, unsuccessful removal of IUD under U/S guidance and hysteroscopy  PROCEDURE:  Procedure(s): HYSTEROSCOPY WITH UNSUCCESSFUL  REMOVAL OF IUD AND DILATION OF THE CERVIX UNDER ULTRASOUND GUIDANCE  SURGEON:  Surgeon(s): Princess Bruins, MD  ANESTHESIA:   general  FINDINGS: Cervix severely stenotic in spite of Cytotec preparation vaginally.  Uterus anteverted with IUD in intrauterine cavity.  DESCRIPTION OF OPERATION: Under general anesthesia with laryngeal mask, the patient is in lithotomy position. She is prepped with Betadine on the suprapubic, vulvar and vaginal areas. The bladder is catheterized. The patient is draped as usual. Timeout is done. Patient cervix was prepared with Cytotec 400 mcg intravaginally last night to soften the cervix. The vaginal exam reveals an anteverted uterus, normal size and mobile. No adnexal mass. The weighted speculum is inserted in the vagina and the anterior lip of the cervix is grasped with a tenaculum. A paracervical block is done with lidocaine 1% 20 cc at 4 and 8:00. The cervix is stenosed in spite of the Cytotec given. We start dilation with the smallest dilator kit. The os finder is also used. The diagnostic hysteroscope is entered in the endocervical canal but the internal os is not visible to guide the passage of the hysteroscope towards the intrauterine cavity. We therefore call ultrasound to continue the procedure under ultrasound guidance. Transabdominal ultrasound is done to guide [place] towards the intra uterine cavity. It appears that the dilator is closed to the cavity which is slightly anterior to the dilator. In spite of ultrasound guidance we are not able to reach the intra uterine cavity and experience rapid increase in fluid  loss. The decision is therefore taken to stop the procedure at this time in case a perforation occurred from the false cervical path. Hemostasis was adequate. All instruments were removed. The patient was brought to recovery room in good and stable status.  ESTIMATED BLOOD LOSS: 5 mL   Fluid Deficit: 575 mL   Intake/Output Summary (Last 24 hours) at 08/18/2019 1004 Last data filed at 08/18/2019 0950 Gross per 24 hour  Intake 800 ml  Output 305 ml  Net 495 ml     BLOOD ADMINISTERED:none   LOCAL MEDICATIONS USED:  Nesacaine 1% 20 cc  SPECIMEN:  Source of Specimen:  None  DISPOSITION OF SPECIMEN:  N/A  COUNTS:  YES  PLAN OF CARE: Transfer to PACU  Mindy LavoieMD10:04 AM

## 2019-08-18 NOTE — Anesthesia Procedure Notes (Signed)
Procedure Name: LMA Insertion Date/Time: 08/18/2019 8:43 AM Performed by: Leonor Liv, CRNA Pre-anesthesia Checklist: Patient identified, Emergency Drugs available, Suction available and Patient being monitored Patient Re-evaluated:Patient Re-evaluated prior to induction Oxygen Delivery Method: Circle System Utilized Preoxygenation: Pre-oxygenation with 100% oxygen Induction Type: IV induction Ventilation: Mask ventilation without difficulty LMA: LMA inserted LMA Size: 4.0 Number of attempts: 1 Airway Equipment and Method: Bite block Placement Confirmation: positive ETCO2 Tube secured with: Tape Dental Injury: Teeth and Oropharynx as per pre-operative assessment

## 2019-08-18 NOTE — H&P (Signed)
Mindy Collins is an 49 y.o. female.G0    RP: HSC/Dilation/Removal of IUD for lost strings/Cervical Stenosis  HPI: IUD confirmed to be intra uterine, but strings lost.  Cervical stenosis prevented removal of IUD under ultrasound guidance.    Menstrual History: No LMP recorded. (Menstrual status: IUD).    Past Medical History:  Diagnosis Date  . Abdominal wall seroma   . Anemia   . Arthritis   . Asthma   . B12 deficiency   . Back pain   . Carpal tunnel syndrome, bilateral   . Chest pain   . Colitis   . Constipation   . Dysmenorrhea   . Dyspnea   . Fatty liver   . GERD (gastroesophageal reflux disease)   . HLD (hyperlipidemia)   . HPV (human papilloma virus) infection    WARTS/ TCA TX  . Hypertension   . Keratoconus   . Leg edema   . Obesity   . Obesity   . Osteoarthritis   . Palpitations   . Pre-diabetes   . Prediabetes   . Seasonal allergies   . Sleep apnea     Past Surgical History:  Procedure Laterality Date  . GYNECOLOGIC CRYOSURGERY  1993   DYSPLASIA CERVIX/   . MYOMECTOMY  02/17/2011   Procedure: MYOMECTOMY;  Surgeon: Terrance Mass, MD;  Location: [facility];  Service: Gynecology;  Laterality: N/A;  I am hoping to get a 7:30am time on Dec 4, Dec 11 or Dec 12. It not available I will check on some 1:00 pm times. Thanks  . MYOMECTOMY ABDOMINAL APPROACH  04/31/2007  . OVARIAN CYST REMOVAL  02/17/2011   Procedure: OVARIAN CYSTECTOMY;  Surgeon: Terrance Mass, MD;  Location: [facility];  Service: Gynecology;  Laterality: Left;    Family History  Problem Relation Age of Onset  . Hypertension Mother   . Breast cancer Mother   . Cancer Mother   . Hyperlipidemia Mother   . Thyroid disease Mother   . Obesity Mother   . Heart disease Maternal Grandmother   . Cancer Father        LUNG  . Breast cancer Maternal Aunt   . Diabetes Maternal Grandfather   . Cancer Paternal Aunt   . Ovarian cancer Paternal Aunt     Social History:  reports that she has never smoked.  She has never used smokeless tobacco. She reports that she does not drink alcohol and does not use drugs.  Allergies:  Allergies  Allergen Reactions  . Esomeprazole Magnesium Shortness Of Breath    Nervousness; tolerates Aciphex.  . Adhesive [Tape] Itching and Rash    Itching and rash with adhesive tape  . Latex Itching and Dermatitis    Skin blistering  . Levocetirizine Dihydrochloride Other (See Comments)    Muscle jerks    Medications Prior to Admission  Medication Sig Dispense Refill Last Dose  . carbamazepine (TEGRETOL) 200 MG tablet Take 1 tablet (200 mg total) by mouth 2 (two) times daily for 14 days. 28 tablet 1 08/17/2019 at Unknown time  . fluticasone (FLONASE) 50 MCG/ACT nasal spray Place 1 spray into both nostrils 2 (two) times daily as needed (allergies.).   0 Past Week at Unknown time  . levonorgestrel (MIRENA) 20 MCG/24HR IUD 1 each by Intrauterine route once.     . loratadine (CLARITIN) 10 MG tablet Take 10 mg by mouth daily as needed for allergies.    08/17/2019 at Unknown time  . meloxicam (MOBIC) 15 MG tablet  TAKE 1 TABLET BY MOUTH DAILY AS NEEDED (Patient taking differently: Take 15 mg by mouth daily as needed (pain/inflammation.). ) 30 tablet 0   . metFORMIN (GLUCOPHAGE) 500 MG tablet Take 1 tablet (500 mg total) by mouth daily with breakfast. 30 tablet 0   . methocarbamol (ROBAXIN) 500 MG tablet Take 1 tablet (500 mg total) by mouth 4 (four) times daily. (Patient taking differently: Take 500 mg by mouth 4 (four) times daily as needed for muscle spasms. ) 60 tablet 0 Past Month at Unknown time  . misoprostol (CYTOTEC) 200 MCG tablet Place 2 tablets (400 mcg total) vaginally once for 1 dose. 2 tab vaginally the everning prior to surgery 2 tablet 0 08/17/2019 at Unknown time  . olmesartan-hydrochlorothiazide (BENICAR HCT) 20-12.5 MG tablet Take 1 tablet by mouth daily. 90 tablet 3 08/17/2019 at Unknown time  . Vitamin D, Ergocalciferol, (DRISDOL) 1.25 MG (50000 UNIT) CAPS capsule  Take 1 capsule (50,000 Units total) by mouth every 3 (three) days. (Patient taking differently: Take 50,000 Units by mouth every 3 (three) days. ) 8 capsule 0 Past Week at Unknown time  . norethindrone (MICRONOR) 0.35 MG tablet Take 1 tablet (0.35 mg total) by mouth daily. 3 tablet 4     REVIEW OF SYSTEMS: A ROS was performed and pertinent positives and negatives are included in the history.  GENERAL: No fevers or chills. HEENT: No change in vision, no earache, sore throat or sinus congestion. NECK: No pain or stiffness. CARDIOVASCULAR: No chest pain or pressure. No palpitations. PULMONARY: No shortness of breath, cough or wheeze. GASTROINTESTINAL: No abdominal pain, nausea, vomiting or diarrhea, melena or bright red blood per rectum. GENITOURINARY: No urinary frequency, urgency, hesitancy or dysuria. MUSCULOSKELETAL: No joint or muscle pain, no back pain, no recent trauma. DERMATOLOGIC: No rash, no itching, no lesions. ENDOCRINE: No polyuria, polydipsia, no heat or cold intolerance. No recent change in weight. HEMATOLOGICAL: No anemia or easy bruising or bleeding. NEUROLOGIC: No headache, seizures, numbness, tingling or weakness. PSYCHIATRIC: No depression, no loss of interest in normal activity or change in sleep pattern.     Blood pressure (!) 156/71, pulse (!) 104, temperature 98.3 F (36.8 C), temperature source Oral, resp. rate 20, height 5\' 2"  (1.575 m), weight 135.2 kg, SpO2 97 %.  Physical Exam:  See office notes   Results for orders placed or performed during the hospital encounter of 08/18/19 (from the past 24 hour(s))  Pregnancy, urine POC     Status: None   Collection Time: 08/18/19  7:13 AM  Result Value Ref Range   Preg Test, Ur NEGATIVE NEGATIVE  Glucose, capillary     Status: Abnormal   Collection Time: 08/18/19  7:25 AM  Result Value Ref Range   Glucose-Capillary 129 (H) 70 - 99 mg/dL   Comment 1 Notify RN    Comment 2 Document in Chart    Covid Negative  Pelvic  US/attempted removal of IUD 07/21/2019: Comparison is made with previous scan February 2020. Both transabdominal and transvaginal techniques were necessary to evaluate the anatomy. Anteverted uterus with several small intramural fibroids with no significant change since previous scan. The uterus is measured at 9.5 x 5.65 x 4.64 cm. The IUD is noted in proper position within the uterine cavity. Both ovaries are small with atrophic appearance, best seen abdominally. Mid pelvis cystic mass with septation is again noted with no significant change in size or appearance since previous scan, probable hydrosalpinx. Measured today at 12 x 7 x 4  cm.  Speculum inserted in the vagina. The IUD strings are not visible at the external os. Anterior lip of the cervix grasped with a tenaculum. Betadine prep done on cervix. Hurricaine spray on cervix. The cervix is very stenotic and the os finder cannot be inserted. We therefore use the very small dilator set. Even with the dilator set we do not reach the intra uterine cavity. The os finder is attempted once more without success. The patient is experiencing significant discomfort and pain. The decision is therefore taken to proceed under anesthesia at [facility] surgical center after softening the cervix with Cytotec. The procedure is therefore stopped at this point. Unsuccessful attempt at IUD removal under ultrasound guidance due to stenotic cervix.   Assessment/Plan:  49 y.o. G0P0   1. Intrauterine contraceptive device threads lost, subsequent encounter Time to remove Mirena IUD.  Strings lost.  Unsuccessful attempt to remove the IUD under ultrasound guidance, cervical stenosis prevented success.  Decision to stop and proceed at [facility] surgical center.  We will give Cytotec vaginally the night before the procedure.  Hysteroscopy dilation and removal of IUD.  Surgery and risks thoroughly reviewed with patient.  Preop and postop care and precautions  reviewed.  Started on the progestin only birth control pills.  2. Stenosis of cervix uteri Cytotec preparation the night before surgery.  Other orders - norethindrone (MICRONOR) 0.35 MG tablet; Take 1 tablet (0.35 mg total) by mouth daily.    Marie-Lyne Amarachukwu Lakatos 08/18/2019, 8:10 AM

## 2019-08-18 NOTE — OR Nursing (Signed)
U/S called to assist Dr. Dellis Filbert at [phone]

## 2019-08-18 NOTE — Transfer of Care (Signed)
Immediate Anesthesia Transfer of Care Note  Patient: Mindy Collins  Procedure(s) Performed: HYSTEROSCOPY WITH UNSUCCESSFUL  REMOVAL OF IUD AND DILATION OF THE CERVIX UNDER ULTRASOUND GUIDANCE (N/A Vagina )  Patient Location: PACU  Anesthesia Type:General  Level of Consciousness: awake, alert  and oriented  Airway & Oxygen Therapy: Patient Spontanous Breathing and Patient connected to nasal cannula oxygen  Post-op Assessment: Report given to RN, Post -op Vital signs reviewed and stable and Patient moving all extremities  Post vital signs: Reviewed and stable  Last Vitals:  Vitals Value Taken Time  BP 138/83 08/18/19 1005  Temp    Pulse 91 08/18/19 1006  Resp 30 08/18/19 1006  SpO2 95 % 08/18/19 1006  Vitals shown include unvalidated device data.  Last Pain:  Vitals:   08/18/19 0729  TempSrc:   PainSc: 1       Patients Stated Pain Goal: 3 (91/79/15 0569)  Complications: No complications documented.

## 2019-08-18 NOTE — Anesthesia Preprocedure Evaluation (Addendum)
Anesthesia Evaluation  Patient identified by MRN, date of birth, ID band Patient awake    Reviewed: Allergy & Precautions, NPO status , Patient's Chart, lab work & pertinent test results  Airway Mallampati: I  TM Distance: >3 FB Neck ROM: Full    Dental  (+) Chipped,    Pulmonary shortness of breath and with exertion, asthma , sleep apnea and Continuous Positive Airway Pressure Ventilation ,    Pulmonary exam normal breath sounds clear to auscultation       Cardiovascular hypertension, Pt. on medications Normal cardiovascular exam Rhythm:Regular Rate:Normal     Neuro/Psych  Neuromuscular disease negative psych ROS   GI/Hepatic Neg liver ROS, GERD  Medicated and Controlled,  Endo/Other  diabetes, Well Controlled, Type 2, Oral Hypoglycemic AgentsMorbid obesityHyperlipidemia  Renal/GU negative Renal ROS  negative genitourinary   Musculoskeletal  (+) Arthritis , Osteoarthritis,    Abdominal (+) + obese,   Peds  Hematology  (+) anemia ,   Anesthesia Other Findings   Reproductive/Obstetrics Cervical stenosis Retained IUD Dysmenorrhea HPV                            Anesthesia Physical Anesthesia Plan  ASA: III  Anesthesia Plan: General   Post-op Pain Management:    Induction: Intravenous  PONV Risk Score and Plan: 4 or greater and Ondansetron, Scopolamine patch - Pre-op, Dexamethasone, Midazolam and Treatment may vary due to age or medical condition  Airway Management Planned: LMA  Additional Equipment:   Intra-op Plan:   Post-operative Plan: Extubation in OR  Informed Consent: I have reviewed the patients History and Physical, chart, labs and discussed the procedure including the risks, benefits and alternatives for the proposed anesthesia with the patient or authorized representative who has indicated his/her understanding and acceptance.     Dental advisory given  Plan  Discussed with: Anesthesiologist, CRNA and Surgeon  Anesthesia Plan Comments:         Anesthesia Quick Evaluation

## 2019-08-18 NOTE — Anesthesia Postprocedure Evaluation (Signed)
Anesthesia Post Note  Patient: Fernande Bras  Procedure(s) Performed: HYSTEROSCOPY WITH UNSUCCESSFUL  REMOVAL OF IUD AND DILATION OF THE CERVIX UNDER ULTRASOUND GUIDANCE (N/A Vagina )     Patient location during evaluation: PACU Anesthesia Type: General Level of consciousness: awake and alert and oriented Pain management: pain level controlled Vital Signs Assessment: post-procedure vital signs reviewed and stable Respiratory status: spontaneous breathing, nonlabored ventilation and respiratory function stable Cardiovascular status: blood pressure returned to baseline and stable Postop Assessment: no apparent nausea or vomiting Anesthetic complications: no   No complications documented.  Last Vitals:  Vitals:   08/18/19 1015 08/18/19 1030  BP: (!) 143/86 138/77  Pulse: 88 85  Resp: 18 19  Temp:    SpO2: 94% 96%    Last Pain:  Vitals:   08/18/19 1030  TempSrc:   PainSc: (P) 5                  Kemaria Dedic A.

## 2019-08-18 NOTE — Discharge Instructions (Signed)
Hysteroscopy, Care After This sheet gives you information about how to care for yourself after your procedure. Your health care provider may also give you more specific instructions. If you have problems or questions, contact your health care provider. What can I expect after the procedure? After the procedure, it is common to have:  Cramping.  Bleeding. This can vary from light spotting to menstrual-like bleeding. Follow these instructions at home: Activity  Rest for 1-2 days after the procedure.  Do not douche, use tampons, or have sex for 2 weeks after the procedure, or until your health care provider approves.  Do not drive for 24 hours after the procedure, or for as long as told by your health care provider.  Do not drive, use heavy machinery, or drink alcohol while taking prescription pain medicines. Medicines   Take over-the-counter and prescription medicines only as told by your health care provider.  Do not take aspirin during recovery. It can increase the risk of bleeding. General instructions  Do not take baths, swim, or use a hot tub until your health care provider approves. Take showers instead of baths for 2 weeks, or for as long as told by your health care provider.  To prevent or treat constipation while you are taking prescription pain medicine, your health care provider may recommend that you: ? Drink enough fluid to keep your urine clear or pale yellow. ? Take over-the-counter or prescription medicines. ? Eat foods that are high in fiber, such as fresh fruits and vegetables, whole grains, and beans. ? Limit foods that are high in fat and processed sugars, such as fried and sweet foods.  Keep all follow-up visits as told by your health care provider. This is important. Contact a health care provider if:  You feel dizzy or lightheaded.  You feel nauseous.  You have abnormal vaginal discharge.  You have a rash.  You have pain that does not get better with  medicine.  You have chills. Get help right away if:  You have bleeding that is heavier than a normal menstrual period.  You have a fever.  You have pain or cramps that get worse.  You develop new abdominal pain.  You faint.  You have pain in your shoulders.  You have shortness of breath. Summary  After the procedure, you may have cramping and some vaginal bleeding.  Do not douche, use tampons, or have sex for 2 weeks after the procedure, or until your health care provider approves.  Do not take baths, swim, or use a hot tub until your health care provider approves. Take showers instead of baths for 2 weeks, or for as long as told by your health care provider.  Report any unusual symptoms to your health care provider.  Keep all follow-up visits as told by your health care provider. This is important. This information is not intended to replace advice given to you by your health care provider. Make sure you discuss any questions you have with your health care provider. Document Revised: 01/09/2017 Document Reviewed: 02/26/2016 Elsevier Patient Education  2020 Elsevier Inc.  

## 2019-08-19 ENCOUNTER — Encounter (HOSPITAL_COMMUNITY): Payer: Self-pay | Admitting: Obstetrics & Gynecology

## 2019-08-22 ENCOUNTER — Telehealth: Payer: Self-pay | Admitting: *Deleted

## 2019-08-22 DIAGNOSIS — T8339XA Other mechanical complication of intrauterine contraceptive device, initial encounter: Secondary | ICD-10-CM

## 2019-08-22 DIAGNOSIS — Z30431 Encounter for routine checking of intrauterine contraceptive device: Secondary | ICD-10-CM

## 2019-08-22 NOTE — Telephone Encounter (Signed)
Dr. Dellis Filbert asked for patient to be called and scheduled for an Ultrasound visit this week and discuss failed surgery from 08/18/19.  Pt understood need of appointment. Apt scheduled 08/25/19 at 120 for [place] arrival at 115 and see ML after.  [place] order placed. KW CMA

## 2019-08-23 ENCOUNTER — Other Ambulatory Visit (INDEPENDENT_AMBULATORY_CARE_PROVIDER_SITE_OTHER): Payer: Self-pay | Admitting: Family Medicine

## 2019-08-23 DIAGNOSIS — R7303 Prediabetes: Secondary | ICD-10-CM

## 2019-08-24 ENCOUNTER — Ambulatory Visit (INDEPENDENT_AMBULATORY_CARE_PROVIDER_SITE_OTHER): Payer: 59 | Admitting: Family Medicine

## 2019-08-25 ENCOUNTER — Encounter: Payer: Self-pay | Admitting: Obstetrics & Gynecology

## 2019-08-25 ENCOUNTER — Ambulatory Visit: Payer: 59 | Admitting: Obstetrics & Gynecology

## 2019-08-25 ENCOUNTER — Other Ambulatory Visit: Payer: Self-pay

## 2019-08-25 ENCOUNTER — Ambulatory Visit (INDEPENDENT_AMBULATORY_CARE_PROVIDER_SITE_OTHER): Payer: 59

## 2019-08-25 VITALS — BP 120/78

## 2019-08-25 DIAGNOSIS — Z30431 Encounter for routine checking of intrauterine contraceptive device: Secondary | ICD-10-CM | POA: Diagnosis not present

## 2019-08-25 DIAGNOSIS — Z09 Encounter for follow-up examination after completed treatment for conditions other than malignant neoplasm: Secondary | ICD-10-CM | POA: Diagnosis not present

## 2019-08-25 DIAGNOSIS — N854 Malposition of uterus: Secondary | ICD-10-CM | POA: Diagnosis not present

## 2019-08-25 DIAGNOSIS — D219 Benign neoplasm of connective and other soft tissue, unspecified: Secondary | ICD-10-CM

## 2019-08-25 DIAGNOSIS — N882 Stricture and stenosis of cervix uteri: Secondary | ICD-10-CM | POA: Diagnosis not present

## 2019-08-25 DIAGNOSIS — Z538 Procedure and treatment not carried out for other reasons: Secondary | ICD-10-CM | POA: Diagnosis not present

## 2019-08-25 DIAGNOSIS — T8339XA Other mechanical complication of intrauterine contraceptive device, initial encounter: Secondary | ICD-10-CM

## 2019-08-25 DIAGNOSIS — Z975 Presence of (intrauterine) contraceptive device: Secondary | ICD-10-CM

## 2019-08-25 NOTE — Progress Notes (Signed)
    Mindy Collins 1970-06-01 [id number]        49 y.o.  G0   RP: Postop attempted IUD removal by [facility] on 08/18/19 and Pelvic US today  HPI: Severe cervical stenosis secondary to previous Cryotherapy/IUD still in place.  Good postop evolution with no abdominal pelvic pain.  No vaginal bleeding.  Normal vaginal secretions.  No fever.  Urine/BMs normal.   OB History  Gravida Para Term Preterm AB Living  0            SAB TAB Ectopic Multiple Live Births               Past medical history,surgical history, problem list, medications, allergies, family history and social history were all reviewed and documented in the EPIC chart.   Directed ROS with pertinent positives and negatives documented in the history of present illness/assessment and plan.  Exam:  There were no vitals filed for this visit. General appearance:  Normal  Pelvic US today: T/V images and T/a images.  Difficult visualization due to patient body habitus.  Anteverted uterus enlarged with fibroids.  The uterus is measured at 7.53 x 4.69 x 4.92 cm.  The fibroids are located at the fundus measuring 1.9 cm and 1.5 cm.  The endometrial lining is symmetrical and measured at 7.8 mm.  The IUD is in normal intra uterine position.  Right ovarian tissue not seen.  Right adnexal tubular cystic mass which is avascular and stable in size since the last ultrasound, measured at 11.6 x 4.2 x 9.7 cm, compatible with hydrosalpinx.  Left ovary difficult to visualize.  No free fluid in the posterior cul-de-sac.   Assessment/Plan:  49 y.o. G0P0   1. Status post gynecological surgery, follow-up exam Attempted IUD removal by [facility]/Pelvic US guidance on 08/18/2019, unsuccessful secondary to severe cervical stenosis s/p Cryotherapy in 1993.  Good postop healing.  Normal gynecologic exam today.  Pelvic US confirming good IUD position in the IU cavity.  Pelvic US findings reviewed with patient, Rt Hydrosalpinx unchanged, small Fibroids.  2. Stenosis of  cervix uteri S/P Cryotherapy of the cervix in 1993.  3. Unsuccessful attempt to remove intrauterine device (IUD) Discussion on management of IUD.  Given patient's surgical risk associated with morbid obesity and the fact that the IUD is in good IU position, no urgency to remove the IUD.  Recommendation to start with improving her fitness and nutrition with a goal of loosing weight as well.  Will then consider a Robotic approach for a TLH/Bilateral Salpingectomy for IUD removal/Fibroids and Rt Hydrosalpinx.  Patient voiced understanding and agreement with plan.  4. Morbid obesity ([place]) Consult at [facility] for Weight Loss Management.  Princess Bruins MD, 2:12 PM 08/25/2019

## 2019-08-31 ENCOUNTER — Encounter: Payer: Self-pay | Admitting: Obstetrics & Gynecology

## 2019-09-06 ENCOUNTER — Encounter: Payer: Self-pay | Admitting: Anesthesiology

## 2019-10-04 ENCOUNTER — Other Ambulatory Visit: Payer: Self-pay

## 2019-10-04 ENCOUNTER — Ambulatory Visit: Payer: 59 | Admitting: Registered Nurse

## 2019-10-04 ENCOUNTER — Encounter: Payer: Self-pay | Admitting: Registered Nurse

## 2019-10-04 VITALS — BP 124/79 | HR 92 | Temp 98.1°F | Resp 18 | Ht 62.0 in | Wt 293.4 lb

## 2019-10-04 DIAGNOSIS — R42 Dizziness and giddiness: Secondary | ICD-10-CM

## 2019-10-04 LAB — GLUCOSE, POCT (MANUAL RESULT ENTRY): POC Glucose: 87 mg/dl (ref 70–99)

## 2019-10-04 NOTE — Patient Instructions (Signed)
° ° ° °  If you have lab work done today you will be contacted with your lab results within the next 2 weeks.  If you have not heard from us then please contact us. The fastest way to get your results is to register for My Chart. ° ° °IF you received an x-ray today, you will receive an invoice from [facility]. Please contact [facility] at [phone] with questions or concerns regarding your invoice.  ° °IF you received labwork today, you will receive an invoice from [facility]. Please contact [facility] at [phone] with questions or concerns regarding your invoice.  ° °Our billing staff will not be able to assist you with questions regarding bills from these companies. ° °You will be contacted with the lab results as soon as they are available. The fastest way to get your results is to activate your My Chart account. Instructions are located on the last page of this paperwork. If you have not heard from us regarding the results in 2 weeks, please contact this office. °  ° ° ° °

## 2019-10-04 NOTE — Progress Notes (Signed)
Established Patient Office Visit  Subjective:  Patient ID: Mindy Collins, female    DOB: [birth year]  Age: 49 y.o. MRN: [id number]  CC:  Chief Complaint  Patient presents with  . Medication Management    Patient states she has had a fall at work and blacked out for a few seconds at work and wondered was it a concern. PAtient states her ribs are sore on the left side.  . Weight Loss    patient would like to discuss weight loss.    HPI Mindy Collins presents for med check and fall.  Notes that she fell at work. Was walking up stairs, seemed to miss a step, and fall on her L side. Landed on her laptop which gave her bruising along her ribs - still some soreness. Does not feel that she was dizzy or lightheaded leading to this fall. No neuro symptoms or CV symptoms to report. This has not happened in over 10 years. Has been very stressed lately d/t work and [place].   Wants to discuss weight loss: has lost 5lb in past 6 weeks or so - is planning on changing her diet to a fad diet, which she acknowledges is not always the best plan but has worked for her in the past.   Past Medical History:  Diagnosis Date  . Abdominal wall seroma   . Anemia   . Arthritis   . Asthma   . B12 deficiency   . Back pain   . Carpal tunnel syndrome, bilateral   . Chest pain   . Colitis   . Constipation   . Dysmenorrhea   . Dyspnea   . Fatty liver   . GERD (gastroesophageal reflux disease)   . HLD (hyperlipidemia)   . HPV (human papilloma virus) infection    WARTS/ TCA [place]  . Hypertension   . Keratoconus   . Leg edema   . Obesity   . Obesity   . Osteoarthritis   . Palpitations   . Pre-diabetes   . Prediabetes   . Seasonal allergies   . Sleep apnea     Past Surgical History:  Procedure Laterality Date  . GYNECOLOGIC CRYOSURGERY  1993   DYSPLASIA CERVIX/   . HYSTEROSCOPY N/A 08/18/2019   Procedure: HYSTEROSCOPY WITH UNSUCCESSFUL  REMOVAL OF IUD AND DILATION OF THE CERVIX UNDER ULTRASOUND GUIDANCE;   Surgeon: Princess Bruins, MD;  Location: [place];  Service: Gynecology;  Laterality: N/A;  request 8:30am OR time in [place] held time for [facility] requests one hour  . MYOMECTOMY  02/17/2011   Procedure: MYOMECTOMY;  Surgeon: Terrance Mass, MD;  Location: [facility];  Service: Gynecology;  Laterality: N/A;  I am hoping to get a 7:30am time on Dec 4, Dec 11 or Dec 12. It not available I will check on some 1:00 pm times. Thanks  . MYOMECTOMY ABDOMINAL APPROACH  04/31/2007  . OVARIAN CYST REMOVAL  02/17/2011   Procedure: OVARIAN CYSTECTOMY;  Surgeon: Terrance Mass, MD;  Location: [facility];  Service: Gynecology;  Laterality: Left;    Family History  Problem Relation Age of Onset  . Hypertension Mother   . Breast cancer Mother   . Cancer Mother   . Hyperlipidemia Mother   . Thyroid disease Mother   . Obesity Mother   . Heart disease Maternal Grandmother   . Cancer Father        LUNG  . Breast cancer Maternal Aunt   . Diabetes Maternal Grandfather   .  Cancer Paternal Aunt   . Ovarian cancer Paternal Aunt     Social History   Socioeconomic History  . Marital status: Single    Spouse name: Not on file  . Number of children: 0  . Years of education: Not on file  . Highest education level: Not on file  Occupational History  . Occupation: Sport and exercise psychologist, Environmental consultant college, admin    Employer: [employer]    Comment: [employer]  Tobacco Use  . Smoking status: Never Smoker  . Smokeless tobacco: Never Used  . Tobacco comment: denies tobacco   Vaping Use  . Vaping Use: Never used  Substance and Sexual Activity  . Alcohol use: No    Alcohol/week: 0.0 standard drinks  . Drug use: No  . Sexual activity: Yes    Birth control/protection: I.U.D.  Other Topics Concern  . Not on file  Social History Narrative   Graduate degree from [employer] in woman/gender studies.   Lives with her mother.   Social Determinants of Health   Financial Resource Strain:   . Difficulty of Paying Living  Expenses: Not on file  Food Insecurity:   . Worried About Charity fundraiser in the Last Year: Not on file  . Ran Out of Food in the Last Year: Not on file  Transportation Needs:   . Lack of Transportation (Medical): Not on file  . Lack of Transportation (Non-Medical): Not on file  Physical Activity:   . Days of Exercise per Week: Not on file  . Minutes of Exercise per Session: Not on file  Stress:   . Feeling of Stress : Not on file  Social Connections:   . Frequency of Communication with Friends and Family: Not on file  . Frequency of Social Gatherings with Friends and Family: Not on file  . Attends Religious Services: Not on file  . Active Member of Clubs or Organizations: Not on file  . Attends Archivist Meetings: Not on file  . Marital Status: Not on file  Intimate Partner Violence:   . Fear of Current or Ex-Partner: Not on file  . Emotionally Abused: Not on file  . Physically Abused: Not on file  . Sexually Abused: Not on file    Outpatient Medications Prior to Visit  Medication Sig Dispense Refill  . fluticasone (FLONASE) 50 MCG/ACT nasal spray Place 1 spray into both nostrils 2 (two) times daily as needed (allergies.).   0  . levonorgestrel (MIRENA) 20 MCG/24HR IUD 1 each by Intrauterine route once.    . loratadine (CLARITIN) 10 MG tablet Take 10 mg by mouth daily as needed for allergies.     . meloxicam (MOBIC) 15 MG tablet TAKE 1 TABLET BY MOUTH DAILY AS NEEDED (Patient taking differently: Take 15 mg by mouth daily as needed (pain/inflammation.). ) 30 tablet 0  . metFORMIN (GLUCOPHAGE) 500 MG tablet Take 1 tablet (500 mg total) by mouth daily with breakfast. 30 tablet 0  . methocarbamol (ROBAXIN) 500 MG tablet Take 1 tablet (500 mg total) by mouth 4 (four) times daily. (Patient taking differently: Take 500 mg by mouth 4 (four) times daily as needed for muscle spasms. ) 60 tablet 0  . olmesartan-hydrochlorothiazide (BENICAR HCT) 20-12.5 MG tablet Take 1 tablet  by mouth daily. 90 tablet 3  . Vitamin D, Ergocalciferol, (DRISDOL) 1.25 MG (50000 UNIT) CAPS capsule Take 1 capsule (50,000 Units total) by mouth every 3 (three) days. (Patient taking differently: Take 50,000 Units by mouth every 3 (three) days. )  8 capsule 0   No facility-administered medications prior to visit.    Allergies  Allergen Reactions  . Esomeprazole Magnesium Shortness Of Breath    Nervousness; tolerates Aciphex.  . Adhesive [Tape] Itching and Rash    Itching and rash with adhesive tape  . Latex Itching and Dermatitis    Skin blistering  . Levocetirizine Dihydrochloride Other (See Comments)    Muscle jerks    ROS Review of Systems  Constitutional: Negative.   HENT: Negative.   Eyes: Negative.   Respiratory: Negative.   Cardiovascular: Negative.   Gastrointestinal: Negative.   Endocrine: Negative.   Genitourinary: Negative.   Musculoskeletal: Negative.   Skin: Negative.   Allergic/Immunologic: Negative.   Neurological: Negative.   Hematological: Negative.   Psychiatric/Behavioral: Negative.   All other systems reviewed and are negative.     Objective:    Physical Exam Vitals and nursing note reviewed.  Constitutional:      Appearance: Normal appearance. She is obese.  Cardiovascular:     Rate and Rhythm: Normal rate and regular rhythm.     Pulses: Normal pulses.     Heart sounds: Normal heart sounds. No murmur heard.  No friction rub. No gallop.   Pulmonary:     Effort: Pulmonary effort is normal. No respiratory distress.     Breath sounds: Normal breath sounds. No stridor. No wheezing, rhonchi or rales.  Chest:     Chest wall: No tenderness.  Skin:    General: Skin is warm and dry.  Neurological:     General: No focal deficit present.     Mental Status: She is alert and oriented to person, place, and time. Mental status is at baseline.  Psychiatric:        Mood and Affect: Mood normal.        Behavior: Behavior normal.        Thought Content:  Thought content normal.        Judgment: Judgment normal.     BP 124/79   Pulse 92   Temp 98.1 F (36.7 C) (Temporal)   Resp 18   Ht 5\' 2"  (1.575 m)   Wt 293 lb 6.4 oz (133.1 kg)   SpO2 97%   BMI 53.66 kg/m  Wt Readings from Last 3 Encounters:  10/04/19 293 lb 6.4 oz (133.1 kg)  08/18/19 298 lb 1 oz (135.2 kg)  08/12/19 298 lb (135.2 kg)     There are no preventive care reminders to display for this patient.  There are no preventive care reminders to display for this patient.  Lab Results  Component Value Date   TSH 3.190 09/07/2017   Lab Results  Component Value Date   WBC 10.5 08/12/2019   HGB 13.4 08/12/2019   HCT 43.0 08/12/2019   MCV 85.8 08/12/2019   PLT 430 (H) 08/12/2019   Lab Results  Component Value Date   NA 143 08/12/2019   K 3.4 (L) 08/12/2019   CO2 29 08/12/2019   GLUCOSE 113 (H) 08/12/2019   BUN 11 08/12/2019   CREATININE 0.81 08/12/2019   BILITOT 0.7 07/13/2019   ALKPHOS 121 07/13/2019   AST 52 (H) 07/13/2019   ALT 80 (H) 07/13/2019   PROT 7.6 07/13/2019   ALBUMIN 4.4 07/13/2019   CALCIUM 9.3 08/12/2019   ANIONGAP 11 08/12/2019   Lab Results  Component Value Date   CHOL 239 (H) 02/23/2019   Lab Results  Component Value Date   HDL 43 02/23/2019   Lab  Results  Component Value Date   LDLCALC 157 (H) 02/23/2019   Lab Results  Component Value Date   TRIG 211 (H) 02/23/2019   Lab Results  Component Value Date   CHOLHDL 4.6 (H) 03/05/2017   Lab Results  Component Value Date   HGBA1C 6.0 (H) 08/12/2019      Assessment & Plan:   Problem List Items Addressed This Visit    None    Visit Diagnoses    Dizziness    -  Primary   Relevant Orders   CBC With Differential   Comprehensive metabolic panel   Lipid panel   Glucose (CBG) (Completed)      No orders of the defined types were placed in this encounter.   Follow-up: No follow-ups on file.   PLAN  Fall seems to be apparently result of misstep - likely related to  her stress- but will draw some labs in case. Will follow up as warranted  Cautioned regarding fad diets - will continue to suggest limiting processed foods, limiting portion sizes, and a largely plant based diet with routine exercise rather than fad diet. Encouraged her to continue follow up with healthy weight and wellness  Patient encouraged to call clinic with any questions, comments, or concerns.  Maximiano Coss, NP

## 2019-10-05 ENCOUNTER — Encounter: Payer: Self-pay | Admitting: Registered Nurse

## 2019-10-05 DIAGNOSIS — E7849 Other hyperlipidemia: Secondary | ICD-10-CM

## 2019-10-05 LAB — CBC WITH DIFFERENTIAL
Basophils Absolute: 0 10*3/uL (ref 0.0–0.2)
Basos: 0 %
EOS (ABSOLUTE): 0.1 10*3/uL (ref 0.0–0.4)
Eos: 1 %
Hematocrit: 41 % (ref 34.0–46.6)
Hemoglobin: 13.3 g/dL (ref 11.1–15.9)
Immature Grans (Abs): 0 10*3/uL (ref 0.0–0.1)
Immature Granulocytes: 0 %
Lymphocytes Absolute: 2.6 10*3/uL (ref 0.7–3.1)
Lymphs: 22 %
MCH: 27 pg (ref 26.6–33.0)
MCHC: 32.4 g/dL (ref 31.5–35.7)
MCV: 83 fL (ref 79–97)
Monocytes Absolute: 0.9 10*3/uL (ref 0.1–0.9)
Monocytes: 7 %
Neutrophils Absolute: 8.2 10*3/uL — ABNORMAL HIGH (ref 1.4–7.0)
Neutrophils: 70 %
RBC: 4.93 x10E6/uL (ref 3.77–5.28)
RDW: 14.4 % (ref 11.7–15.4)
WBC: 11.8 10*3/uL — ABNORMAL HIGH (ref 3.4–10.8)

## 2019-10-05 LAB — COMPREHENSIVE METABOLIC PANEL
ALT: 74 IU/L — ABNORMAL HIGH (ref 0–32)
AST: 53 IU/L — ABNORMAL HIGH (ref 0–40)
Albumin/Globulin Ratio: 1.4 (ref 1.2–2.2)
Albumin: 4.3 g/dL (ref 3.8–4.8)
Alkaline Phosphatase: 124 IU/L — ABNORMAL HIGH (ref 48–121)
BUN/Creatinine Ratio: 15 (ref 9–23)
BUN: 11 mg/dL (ref 6–24)
Bilirubin Total: 0.6 mg/dL (ref 0.0–1.2)
CO2: 30 mmol/L — ABNORMAL HIGH (ref 20–29)
Calcium: 9.4 mg/dL (ref 8.7–10.2)
Chloride: 99 mmol/L (ref 96–106)
Creatinine, Ser: 0.72 mg/dL (ref 0.57–1.00)
GFR calc Af Amer: 115 mL/min/{1.73_m2} (ref >59)
GFR calc non Af Amer: 100 mL/min/{1.73_m2} (ref >59)
Globulin, Total: 3 g/dL (ref 1.5–4.5)
Glucose: 88 mg/dL (ref 65–99)
Potassium: 3.5 mmol/L (ref 3.5–5.2)
Sodium: 143 mmol/L (ref 134–144)
Total Protein: 7.3 g/dL (ref 6.0–8.5)

## 2019-10-05 LAB — LIPID PANEL
Chol/HDL Ratio: 5.2 ratio — ABNORMAL HIGH (ref 0.0–4.4)
Cholesterol, Total: 262 mg/dL — ABNORMAL HIGH (ref 100–199)
HDL: 50 mg/dL (ref >39)
LDL Chol Calc (NIH): 189 mg/dL — ABNORMAL HIGH (ref 0–99)
Triglycerides: 127 mg/dL (ref 0–149)
VLDL Cholesterol Cal: 23 mg/dL (ref 5–40)

## 2019-10-05 MED ORDER — ATORVASTATIN CALCIUM 40 MG PO TABS: 40.0000 mg | ORAL_TABLET | Freq: Every day | ORAL | 3 refills | Status: DC

## 2019-10-10 ENCOUNTER — Telehealth: Payer: Self-pay | Admitting: Registered Nurse

## 2019-10-10 NOTE — Telephone Encounter (Signed)
I have called pt back and informed her that form has been completed and signed by provider. She will come by to pick it up at 4 pm today.

## 2019-10-10 NOTE — Telephone Encounter (Signed)
Pt came by and dropped off the paper to be re filled out. Gave to [facility] that is working with NP Orland Mustard today that she will give to him to sign. Let her know that pt would like a call when it is filled out for her to come pick up. Please advise.

## 2019-10-10 NOTE — Telephone Encounter (Signed)
Pt is coming by and dropping off insurance paperwork that provider just filled out. Pt has miss placed forms and needs them refilled out. Pt stated she will be at the office later today (10/10/19) to drop off paperwork. Please advise.

## 2019-10-11 NOTE — Telephone Encounter (Signed)
I have called the pt on yesterday and she stated that she was going to pick up the form. I have check the pt has not picked up the forms as of yet.

## 2019-11-09 ENCOUNTER — Encounter: Payer: Self-pay | Admitting: Registered Nurse

## 2019-11-09 ENCOUNTER — Ambulatory Visit: Payer: 59 | Admitting: Registered Nurse

## 2019-11-09 ENCOUNTER — Other Ambulatory Visit: Payer: Self-pay

## 2019-11-09 VITALS — BP 135/87 | HR 94 | Temp 98.2°F | Ht 62.0 in | Wt 290.0 lb

## 2019-11-09 DIAGNOSIS — M79672 Pain in left foot: Secondary | ICD-10-CM | POA: Diagnosis not present

## 2019-11-09 NOTE — Patient Instructions (Signed)
° ° ° °  If you have lab work done today you will be contacted with your lab results within the next 2 weeks.  If you have not heard from us then please contact us. The fastest way to get your results is to register for My Chart. ° ° °IF you received an x-ray today, you will receive an invoice from [facility]. Please contact  Radiology at [phone] with questions or concerns regarding your invoice.  ° °IF you received labwork today, you will receive an invoice from [facility]. Please contact [facility] at [phone] with questions or concerns regarding your invoice.  ° °Our billing staff will not be able to assist you with questions regarding bills from these companies. ° °You will be contacted with the lab results as soon as they are available. The fastest way to get your results is to activate your My Chart account. Instructions are located on the last page of this paperwork. If you have not heard from us regarding the results in 2 weeks, please contact this office. °  ° ° ° °

## 2019-12-15 ENCOUNTER — Ambulatory Visit: Payer: 59 | Admitting: Podiatry

## 2019-12-15 ENCOUNTER — Other Ambulatory Visit: Payer: Self-pay

## 2019-12-15 ENCOUNTER — Other Ambulatory Visit: Payer: Self-pay | Admitting: Podiatry

## 2019-12-15 ENCOUNTER — Encounter: Payer: Self-pay | Admitting: Podiatry

## 2019-12-15 ENCOUNTER — Ambulatory Visit (INDEPENDENT_AMBULATORY_CARE_PROVIDER_SITE_OTHER): Payer: 59

## 2019-12-15 DIAGNOSIS — R52 Pain, unspecified: Secondary | ICD-10-CM

## 2019-12-15 DIAGNOSIS — L309 Dermatitis, unspecified: Secondary | ICD-10-CM | POA: Diagnosis not present

## 2019-12-15 DIAGNOSIS — M7751 Other enthesopathy of right foot: Secondary | ICD-10-CM | POA: Diagnosis not present

## 2019-12-15 DIAGNOSIS — M779 Enthesopathy, unspecified: Secondary | ICD-10-CM

## 2019-12-15 DIAGNOSIS — M7752 Other enthesopathy of left foot: Secondary | ICD-10-CM

## 2019-12-15 MED ORDER — TERBINAFINE HCL 250 MG PO TABS: 250.0000 mg | ORAL_TABLET | Freq: Every day | ORAL | 0 refills | Status: DC

## 2019-12-15 NOTE — Progress Notes (Signed)
Subjective:   Patient ID: Mindy Collins, female   DOB: 49 y.o.   MRN: [id number]   HPI Patient presents stating she has had problems with her heels and also has had blisters on her right plantar foot over the left foot that had become tender.  She does have obesity which is a complicating factor and would like to be able to exercise more and does not smoke   Review of Systems  All other systems reviewed and are negative.       Objective:  Physical Exam Vitals and nursing note reviewed.  Constitutional:      Appearance: She is well-developed.  Pulmonary:     Effort: Pulmonary effort is normal.  Musculoskeletal:        General: Normal range of motion.  Skin:    General: Skin is warm.  Neurological:     Mental Status: She is alert.     Neurovascular status was found to be intact muscle strength is adequate range of motion subtalar midtarsal joint is adequate.  Patient has flatfoot deformity has fairly heels of a mild nature bilateral and has a lesion on the right arch with several small ones around it and history of lesion on the heel region bilateral.  Patient has good digital perfusion well oriented x3    Assessment:  Moderate flatfoot deformity with low to mid grade fasciitis symptomatology along with probability for fungal infection plantar     Plan:  H&P reviewed both conditions and I have recommended Lamisil for 3 weeks to control any fungal infection.  Patient is placed on Lamisil 250 mg daily for the next several weeks and will begin soaks therapy and also we discussed supportive arch therapy.  Patient will be seen back as needed  X-rays indicate significant flattening the arch bilateral with no indications of spur or arthritic conditions

## 2019-12-16 ENCOUNTER — Encounter: Payer: Self-pay | Admitting: Podiatry

## 2020-01-08 ENCOUNTER — Encounter: Payer: Self-pay | Admitting: Registered Nurse

## 2020-01-08 NOTE — Progress Notes (Signed)
Established Patient Office Visit  Subjective:  Patient ID: Mindy Collins, female    DOB: March 09, 1970  Age: 49 y.o. MRN: [id number]  CC:  Chief Complaint  Patient presents with   Foot Pain    Pt reports pain on the bottom of her L foot. Pt reports pain while walking and that she has the same "blood spot" that the provider saw in her R foot during last OV. pt rports the pain started this week.    HPI Mindy Collins presents for foot pain Wants to check on blood blister spot seen at last OV Pain is when walking Notes that she usually wears flats Walks a lot around work - at [employer], tries to do extra walking at work for exercise No radiation up leg No numbness or tingling No injury  Past Medical History:  Diagnosis Date   Abdominal wall seroma    Anemia    Arthritis    Asthma    B12 deficiency    Back pain    Carpal tunnel syndrome, bilateral    Chest pain    Colitis    Constipation    Dysmenorrhea    Dyspnea    Fatty liver    GERD (gastroesophageal reflux disease)    HLD (hyperlipidemia)    HPV (human papilloma virus) infection    WARTS/ TCA TX   Hypertension    Keratoconus    Leg edema    Obesity    Obesity    Osteoarthritis    Palpitations    Pre-diabetes    Prediabetes    Seasonal allergies    Sleep apnea     Past Surgical History:  Procedure Laterality Date   GYNECOLOGIC CRYOSURGERY  1993   DYSPLASIA CERVIX/    HYSTEROSCOPY N/A 08/18/2019   Procedure: HYSTEROSCOPY WITH UNSUCCESSFUL  REMOVAL OF IUD AND DILATION OF THE CERVIX UNDER ULTRASOUND GUIDANCE;  Surgeon: Princess Bruins, MD;  Location: [place];  Service: Gynecology;  Laterality: N/A;  request 8:30am OR time in [place] held time for [facility] Gyn requests one hour   MYOMECTOMY  02/17/2011   Procedure: MYOMECTOMY;  Surgeon: Terrance Mass, MD;  Location: [facility];  Service: Gynecology;  Laterality: N/A;  I am hoping to get a 7:30am time on Dec 4, Dec 11 or Dec 12. It not  available I will check on some 1:00 pm times. Thanks   MYOMECTOMY ABDOMINAL APPROACH  04/31/2007   OVARIAN CYST REMOVAL  02/17/2011   Procedure: OVARIAN CYSTECTOMY;  Surgeon: Terrance Mass, MD;  Location: [facility];  Service: Gynecology;  Laterality: Left;    Family History  Problem Relation Age of Onset   Hypertension Mother    Breast cancer Mother    Cancer Mother    Hyperlipidemia Mother    Thyroid disease Mother    Obesity Mother    Heart disease Maternal Grandmother    Cancer Father        LUNG   Breast cancer Maternal Aunt    Diabetes Maternal Grandfather    Cancer Paternal Aunt    Ovarian cancer Paternal Aunt     Social History   Socioeconomic History   Marital status: Single    Spouse name: Not on file   Number of children: 0   Years of education: Not on file   Highest education level: Not on file  Occupational History   Occupation: Sport and exercise psychologist, Environmental consultant college, admin    Employer: [employer]    Comment: [employer]  Tobacco Use  Smoking status: Never Smoker   Smokeless tobacco: Never Used   Tobacco comment: denies tobacco   Vaping Use   Vaping Use: Never used  Substance and Sexual Activity   Alcohol use: No    Alcohol/week: 0.0 standard drinks   Drug use: No   Sexual activity: Yes    Birth control/protection: I.U.D.  Other Topics Concern   Not on file  Social History Narrative   Graduate degree from [employer] in woman/gender studies.   Lives with her mother.   Social Determinants of Health   Financial Resource Strain:    Difficulty of Paying Living Expenses: Not on file  Food Insecurity:    Worried About Charity fundraiser in the Last Year: Not on file   [employer] of Food in the Last Year: Not on file  Transportation Needs:    Lack of Transportation (Medical): Not on file   Lack of Transportation (Non-Medical): Not on file  Physical Activity:    Days of Exercise per Week: Not on file   Minutes of Exercise per  Session: Not on file  Stress:    Feeling of Stress : Not on file  Social Connections:    Frequency of Communication with Friends and Family: Not on file   Frequency of Social Gatherings with Friends and Family: Not on file   Attends Religious Services: Not on file   Active Member of Clubs or Organizations: Not on file   Attends Archivist Meetings: Not on file   Marital Status: Not on file  Intimate Partner Violence:    Fear of Current or Ex-Partner: Not on file   Emotionally Abused: Not on file   Physically Abused: Not on file   Sexually Abused: Not on file    Outpatient Medications Prior to Visit  Medication Sig Dispense Refill   atorvastatin (LIPITOR) 40 MG tablet Take 1 tablet (40 mg total) by mouth daily. 90 tablet 3   fluticasone (FLONASE) 50 MCG/ACT nasal spray Place 1 spray into both nostrils 2 (two) times daily as needed (allergies.).   0   levonorgestrel (MIRENA) 20 MCG/24HR IUD 1 each by Intrauterine route once.     loratadine (CLARITIN) 10 MG tablet Take 10 mg by mouth daily as needed for allergies.      meloxicam (MOBIC) 15 MG tablet TAKE 1 TABLET BY MOUTH DAILY AS NEEDED (Patient taking differently: Take 15 mg by mouth daily as needed (pain/inflammation.). ) 30 tablet 0   metFORMIN (GLUCOPHAGE) 500 MG tablet Take 1 tablet (500 mg total) by mouth daily with breakfast. 30 tablet 0   methocarbamol (ROBAXIN) 500 MG tablet Take 1 tablet (500 mg total) by mouth 4 (four) times daily. (Patient taking differently: Take 500 mg by mouth 4 (four) times daily as needed for muscle spasms. ) 60 tablet 0   olmesartan-hydrochlorothiazide (BENICAR HCT) 20-12.5 MG tablet Take 1 tablet by mouth daily. 90 tablet 3   Vitamin D, Ergocalciferol, (DRISDOL) 1.25 MG (50000 UNIT) CAPS capsule Take 1 capsule (50,000 Units total) by mouth every 3 (three) days. (Patient taking differently: Take 50,000 Units by mouth every 3 (three) days. ) 8 capsule 0   No  facility-administered medications prior to visit.    Allergies  Allergen Reactions   Esomeprazole Magnesium Shortness Of Breath    Nervousness; tolerates Aciphex.   Adhesive [Tape] Itching and Rash    Itching and rash with adhesive tape   Latex Itching and Dermatitis    Skin blistering   Levocetirizine  Dihydrochloride Other (See Comments)    Muscle jerks    ROS Review of Systems  Constitutional: Negative.   HENT: Negative.   Eyes: Negative.   Respiratory: Negative.   Cardiovascular: Negative.   Gastrointestinal: Negative.   Genitourinary: Negative.   Musculoskeletal: Negative.   Skin: Negative.   Neurological: Negative.   Psychiatric/Behavioral: Negative.       Objective:    Physical Exam Vitals and nursing note reviewed.  Constitutional:      General: She is not in acute distress.    Appearance: Normal appearance. She is normal weight. She is not ill-appearing, toxic-appearing or diaphoretic.  Cardiovascular:     Rate and Rhythm: Normal rate and regular rhythm.     Heart sounds: Normal heart sounds. No murmur heard.  No friction rub. No gallop.   Pulmonary:     Effort: Pulmonary effort is normal. No respiratory distress.     Breath sounds: Normal breath sounds. No stridor. No wheezing, rhonchi or rales.  Chest:     Chest wall: No tenderness.  Skin:    General: Skin is warm and dry.     Findings: Lesion (blood blister on foot, removed with light scrape) present.  Neurological:     General: No focal deficit present.     Mental Status: She is alert and oriented to person, place, and time. Mental status is at baseline.  Psychiatric:        Mood and Affect: Mood normal.        Behavior: Behavior normal.        Thought Content: Thought content normal.        Judgment: Judgment normal.     BP 135/87    Pulse 94    Temp 98.2 F (36.8 C) (Temporal)    Ht 5\' 2"  (1.575 m)    Wt 290 lb (131.5 kg)    SpO2 96%    BMI 53.04 kg/m  Wt Readings from Last 3  Encounters:  11/09/19 290 lb (131.5 kg)  10/04/19 293 lb 6.4 oz (133.1 kg)  08/18/19 298 lb 1 oz (135.2 kg)     There are no preventive care reminders to display for this patient.  There are no preventive care reminders to display for this patient.  Lab Results  Component Value Date   TSH 3.190 09/07/2017   Lab Results  Component Value Date   WBC 11.8 (H) 10/04/2019   HGB 13.3 10/04/2019   HCT 41.0 10/04/2019   MCV 83 10/04/2019   PLT 430 (H) 08/12/2019   Lab Results  Component Value Date   NA 143 10/04/2019   K 3.5 10/04/2019   CO2 30 (H) 10/04/2019   GLUCOSE 88 10/04/2019   BUN 11 10/04/2019   CREATININE 0.72 10/04/2019   BILITOT 0.6 10/04/2019   ALKPHOS 124 (H) 10/04/2019   AST 53 (H) 10/04/2019   ALT 74 (H) 10/04/2019   PROT 7.3 10/04/2019   ALBUMIN 4.3 10/04/2019   CALCIUM 9.4 10/04/2019   ANIONGAP 11 08/12/2019   Lab Results  Component Value Date   CHOL 262 (H) 10/04/2019   Lab Results  Component Value Date   HDL 50 10/04/2019   Lab Results  Component Value Date   LDLCALC 189 (H) 10/04/2019   Lab Results  Component Value Date   TRIG 127 10/04/2019   Lab Results  Component Value Date   CHOLHDL 5.2 (H) 10/04/2019   Lab Results  Component Value Date   HGBA1C 6.0 (H) 08/12/2019  Assessment & Plan:   Problem List Items Addressed This Visit    None    Visit Diagnoses    Left foot pain    -  Primary      No orders of the defined types were placed in this encounter.   Follow-up: No follow-ups on file.   PLAN  Suspect some pes planus with unsupportive shoes. Discussed wearing shoes with more support as well as supportive care.  If no resolution can consider referral to Podiatry or ortho  Patient encouraged to call clinic with any questions, comments, or concerns.  Maximiano Coss, NP

## 2020-01-12 ENCOUNTER — Other Ambulatory Visit: Payer: Self-pay

## 2020-01-12 ENCOUNTER — Ambulatory Visit: Payer: 59 | Admitting: Family Medicine

## 2020-01-12 ENCOUNTER — Encounter: Payer: Self-pay | Admitting: Family Medicine

## 2020-01-12 VITALS — BP 138/76 | HR 62 | Temp 98.6°F | Resp 16 | Ht 62.0 in | Wt 289.4 lb

## 2020-01-12 DIAGNOSIS — L0882 Omphalitis not of newborn: Secondary | ICD-10-CM | POA: Diagnosis not present

## 2020-01-12 MED ORDER — FLUCONAZOLE 150 MG PO TABS: 150.0000 mg | ORAL_TABLET | Freq: Once | ORAL | 0 refills | Status: AC

## 2020-01-12 MED ORDER — AMOXICILLIN-POT CLAVULANATE 875-125 MG PO TABS: 1.0000 | ORAL_TABLET | Freq: Two times a day (BID) | ORAL | 0 refills | Status: DC

## 2020-01-12 NOTE — Patient Instructions (Addendum)
  Augmentin 875 1 twice daily for 5 to 7 days.  Can stop after 5 days if healing up well.  Fluconazole in case you  develop yeast.  I would prefer using Polysporin ointment to new the Neosporin ointment.  The most important thing is keeping it clean and not scratching at it.  Return if further problems   If you have lab work done today you will be contacted with your lab results within the next 2 weeks.  If you have not heard from [place] then please contact us. The fastest way to get your results is to register for My Chart.   IF you received an x-ray today, you will receive an invoice from [facility]. Please contact [facility] at [phone] with questions or concerns regarding your invoice.   IF you received labwork today, you will receive an invoice from [facility]. Please contact [facility] at [phone] with questions or concerns regarding your invoice.   Our billing staff will not be able to assist you with questions regarding bills from these companies.  You will be contacted with the lab results as soon as they are available. The fastest way to get your results is to activate your My Chart account. Instructions are located on the last page of this paperwork. If you have not heard from [place] regarding the results in 2 weeks, please contact this office.

## 2020-01-12 NOTE — Progress Notes (Signed)
Patient ID: Mindy Collins, female    DOB: [birth year]  Age: 49 y.o. MRN: [id number]  Chief Complaint  Patient presents with  . Cellulitis    pt reports monday she noticed a small scab at her naval wipped the area and puss with blood came out reports this was not painful but it has recurred, tried washing and observing. feels swollen, some discomfort but not painful, redness    Subjective:   3 or 4 days ago the patient noticed a scab on her umbilicus and she pulled it off in the shower.  She washing at it and wiping at and got some more stuff.  At 1 point there is greenish drainage.  She is used some Neosporin the last couple days.  She thought she felt something there and was concerned so she came on in to get it checked.  She has not had this problem in the past.   Antibiotics often cause her yeast infections Current allergies, medications, problem list, past/family and social histories reviewed.  Objective:  BP 138/76   Pulse 62   Temp 98.6 F (37 C) (Temporal)   Resp 16   Ht 5\' 2"  (1.575 m)   Wt 289 lb 6.4 oz (131.3 kg)   SpO2 98%   BMI 52.93 kg/m   No acute distress.  Obese, pleasant.  Abdomen is soft.  She has a little fissured area at about the 6 o'clock position of the opening to the umbilicus.  She does not seem to be having pus or drainage from deep in the umbilicus, more along the canal.  Culture was taken.  No palpable masses or nodes or cystic lesions.  Assessment & Plan:   Assessment: 1. Omphalitis in adult       Plan: See instructions.  Check back with [place] for the culture results if concerned of not healing.  Orders Placed This Encounter  Procedures  . WOUND CULTURE    Order Specific Question:   Source    Answer:   [employer]    No orders of the defined types were placed in this encounter.        Patient Instructions    Augmentin 875 1 twice daily for 5 to 7 days.  Can stop after 5 days if healing up well.  Fluconazole in case you  develop yeast.  I  would prefer using Polysporin ointment to new the Neosporin ointment.  The most important thing is keeping it clean and not scratching at it.  Return if further problems   If you have lab work done today you will be contacted with your lab results within the next 2 weeks.  If you have not heard from [place] then please contact us. The fastest way to get your results is to register for My Chart.   IF you received an x-ray today, you will receive an invoice from [facility]. Please contact [facility] at [phone] with questions or concerns regarding your invoice.   IF you received labwork today, you will receive an invoice from [facility]. Please contact [facility] at [phone] with questions or concerns regarding your invoice.   Our billing staff will not be able to assist you with questions regarding bills from these companies.  You will be contacted with the lab results as soon as they are available. The fastest way to get your results is to activate your My Chart account. Instructions are located on the last page of this paperwork. If you have not heard from [place] regarding  the results in 2 weeks, please contact this office.        No follow-ups on file.   Ruben Reason, MD 01/12/2020

## 2020-01-13 ENCOUNTER — Telehealth: Payer: Self-pay | Admitting: Registered Nurse

## 2020-01-17 ENCOUNTER — Encounter: Payer: Self-pay | Admitting: Family Medicine

## 2020-01-17 LAB — WOUND CULTURE: Organism ID, Bacteria: NONE SEEN

## 2020-01-25 ENCOUNTER — Ambulatory Visit: Payer: 59 | Admitting: Registered Nurse

## 2020-01-26 ENCOUNTER — Encounter: Payer: Self-pay | Admitting: Registered Nurse

## 2020-01-30 ENCOUNTER — Ambulatory Visit: Payer: 59 | Admitting: Registered Nurse

## 2020-01-31 ENCOUNTER — Encounter: Payer: Self-pay | Admitting: Registered Nurse

## 2020-02-05 ENCOUNTER — Encounter (HOSPITAL_COMMUNITY): Payer: Self-pay

## 2020-02-05 ENCOUNTER — Other Ambulatory Visit: Payer: Self-pay

## 2020-02-05 ENCOUNTER — Ambulatory Visit (HOSPITAL_COMMUNITY)
Admission: EM | Admit: 2020-02-05 | Discharge: 2020-02-05 | Disposition: A | Discharge status: Home / Self Care | Payer: 59 | Attending: Urgent Care | Admitting: Urgent Care

## 2020-02-05 DIAGNOSIS — M79672 Pain in left foot: Secondary | ICD-10-CM

## 2020-02-05 DIAGNOSIS — M79605 Pain in left leg: Secondary | ICD-10-CM

## 2020-02-05 MED ORDER — PREDNISONE 10 MG PO TABS: 30.0000 mg | ORAL_TABLET | Freq: Every day | ORAL | 0 refills | Status: DC

## 2020-02-05 MED ORDER — TIZANIDINE HCL 4 MG PO TABS: 4.0000 mg | ORAL_TABLET | Freq: Three times a day (TID) | ORAL | 0 refills | Status: DC | PRN

## 2020-02-05 NOTE — ED Triage Notes (Addendum)
Pt in with c/o left leg pain that's started yesterday. States that a chair fell on her foot and hit her leg but the pain didn't start until hours later. Also c/o knee and ankle pain  Pt applied heating pack with minimal relief  States that she noticed bruising on the top of her foot last night

## 2020-02-05 NOTE — ED Provider Notes (Signed)
[place]   MRN: [id number] DOB: 1970-08-07  Subjective:   Mindy Collins is a 49 y.o. female presenting for 1 day history of acute onset severe pain of her left leg.  Patient states that she had her leg on the shin and her foot yesterday.  However the entire leg felt tingly and had severe 10 out of 10 pain.  This is improved today.  She was very active yesterday as well, was doing a lot of lifting a lot of cleaning, bending at the level of the torso and twisting.  She has a history of sciatica.  Blood sugars very well controlled, no diabetes but does have prediabetes.  No current facility-administered medications for this encounter.  Current Outpatient Medications:    amoxicillin-clavulanate (AUGMENTIN) 875-125 MG tablet, Take 1 tablet by mouth 2 (two) times daily., Disp: 14 tablet, Rfl: 0   atorvastatin (LIPITOR) 40 MG tablet, Take 1 tablet (40 mg total) by mouth daily., Disp: 90 tablet, Rfl: 3   fluticasone (FLONASE) 50 MCG/ACT nasal spray, Place 1 spray into both nostrils 2 (two) times daily as needed (allergies.). , Disp: , Rfl: 0   levonorgestrel (MIRENA) 20 MCG/24HR IUD, 1 each by Intrauterine route once., Disp: , Rfl:    loratadine (CLARITIN) 10 MG tablet, Take 10 mg by mouth daily as needed for allergies. , Disp: , Rfl:    meloxicam (MOBIC) 15 MG tablet, TAKE 1 TABLET BY MOUTH DAILY AS NEEDED (Patient taking differently: Take 15 mg by mouth daily as needed (pain/inflammation.). ), Disp: 30 tablet, Rfl: 0   metFORMIN (GLUCOPHAGE) 500 MG tablet, Take 1 tablet (500 mg total) by mouth daily with breakfast., Disp: 30 tablet, Rfl: 0   methocarbamol (ROBAXIN) 500 MG tablet, Take 1 tablet (500 mg total) by mouth 4 (four) times daily. (Patient taking differently: Take 500 mg by mouth 4 (four) times daily as needed for muscle spasms.), Disp: 60 tablet, Rfl: 0   olmesartan-hydrochlorothiazide (BENICAR HCT) 20-12.5 MG tablet, Take 1 tablet by mouth daily., Disp: 90  tablet, Rfl: 3   Vitamin D, Ergocalciferol, (DRISDOL) 1.25 MG (50000 UNIT) CAPS capsule, Take 1 capsule (50,000 Units total) by mouth every 3 (three) days. (Patient taking differently: Take 50,000 Units by mouth every 3 (three) days.), Disp: 8 capsule, Rfl: 0   Allergies  Allergen Reactions   Esomeprazole Magnesium Shortness Of Breath    Nervousness; tolerates Aciphex.   Adhesive [Tape] Itching and Rash    Itching and rash with adhesive tape   Latex Itching and Dermatitis    Skin blistering   Levocetirizine Dihydrochloride Other (See Comments)    Muscle jerks    Past Medical History:  Diagnosis Date   Abdominal wall seroma    Anemia    Arthritis    Asthma    B12 deficiency    Back pain    Carpal tunnel syndrome, bilateral    Chest pain    Colitis    Constipation    Dysmenorrhea    Dyspnea    Fatty liver    GERD (gastroesophageal reflux disease)    HLD (hyperlipidemia)    HPV (human papilloma virus) infection    WARTS/ TCA [place]   Hypertension    Keratoconus    Leg edema    Obesity    Obesity    Osteoarthritis    Palpitations    Pre-diabetes    Prediabetes    Seasonal allergies    Sleep apnea  Past Surgical History:  Procedure Laterality Date   GYNECOLOGIC CRYOSURGERY  1993   DYSPLASIA CERVIX/    HYSTEROSCOPY N/A 08/18/2019   Procedure: HYSTEROSCOPY WITH UNSUCCESSFUL  REMOVAL OF IUD AND DILATION OF THE CERVIX UNDER ULTRASOUND GUIDANCE;  Surgeon: Princess Bruins, MD;  Location: [place];  Service: Gynecology;  Laterality: N/A;  request 8:30am OR time in [place] held time for [facility] Gyn requests one hour   MYOMECTOMY  02/17/2011   Procedure: MYOMECTOMY;  Surgeon: Terrance Mass, MD;  Location: [facility];  Service: Gynecology;  Laterality: N/A;  I am hoping to get a 7:30am time on Dec 4, Dec 11 or Dec 12. It not available I will check on some 1:00 pm times. Thanks   MYOMECTOMY ABDOMINAL APPROACH  04/31/2007   OVARIAN CYST  REMOVAL  02/17/2011   Procedure: OVARIAN CYSTECTOMY;  Surgeon: Terrance Mass, MD;  Location: [place] [facility];  Service: Gynecology;  Laterality: Left;    Family History  Problem Relation Age of Onset   Hypertension Mother    Breast cancer Mother    Cancer Mother    Hyperlipidemia Mother    Thyroid disease Mother    Obesity Mother    Heart disease Maternal Grandmother    Cancer Father        LUNG   Breast cancer Maternal Aunt    Diabetes Maternal Grandfather    Cancer Paternal Aunt    Ovarian cancer Paternal Aunt     Social History   Tobacco Use   Smoking status: Never Smoker   Smokeless tobacco: Never Used   Tobacco comment: denies tobacco   Vaping Use   Vaping Use: Never used  Substance Use Topics   Alcohol use: No    Alcohol/week: 0.0 standard drinks   Drug use: No    ROS   Objective:   Vitals: BP (!) 144/65 (BP Location: Left Arm)    Pulse 78    Resp 20    SpO2 97%   Physical Exam Constitutional:      General: She is not in acute distress.    Appearance: Normal appearance. She is well-developed. She is obese. She is not ill-appearing, toxic-appearing or diaphoretic.  HENT:     Head: Normocephalic and atraumatic.     Nose: Nose normal.     Mouth/Throat:     Mouth: Mucous membranes are moist.     Pharynx: Oropharynx is clear.  Eyes:     General: No scleral icterus.    Extraocular Movements: Extraocular movements intact.     Pupils: Pupils are equal, round, and reactive to light.  Cardiovascular:     Rate and Rhythm: Normal rate.  Pulmonary:     Effort: Pulmonary effort is normal.  Musculoskeletal:     Comments: No tenderness elicited for the low back and left thigh.  She did have some mild tenderness of her left lower extremity and foot.  No ecchymosis, bleeding, lacerations.  Negative straight leg raise.  Has full range of motion, strength 5/5 for lower extremities.  Skin:    General: Skin is warm and dry.  Neurological:     General: No  focal deficit present.     Mental Status: She is alert and oriented to person, place, and time.     Motor: No weakness.     Coordination: Coordination normal.     Gait: Gait normal.     Deep Tendon Reflexes: Reflexes normal.  Psychiatric:        Mood and Affect:  Mood normal.        Behavior: Behavior normal.        Thought Content: Thought content normal.        Judgment: Judgment normal.     Assessment and Plan :   PDMP not reviewed this encounter.  1. Left leg pain   2. Left foot pain     Given the severity of her pain, history of sciatica in nature of her symptoms recommended a lower dose of prednisone as she is not responding to meloxicam.  Counseled on back care, use of muscle relaxant.  Deferred imaging given lack of physical exam findings, HPI warranting this. Counseled patient on potential for adverse effects with medications prescribed/recommended today, ER and return-to-clinic precautions discussed, patient verbalized understanding.    [name], PA-C 02/05/20 1701

## 2020-02-08 ENCOUNTER — Other Ambulatory Visit: Payer: Self-pay

## 2020-02-08 ENCOUNTER — Encounter: Payer: Self-pay | Admitting: Registered Nurse

## 2020-02-08 ENCOUNTER — Ambulatory Visit: Payer: 59 | Admitting: Registered Nurse

## 2020-02-08 VITALS — BP 115/75 | HR 88 | Temp 98.7°F | Resp 18 | Ht 62.0 in | Wt 290.0 lb

## 2020-02-08 DIAGNOSIS — E7849 Other hyperlipidemia: Secondary | ICD-10-CM | POA: Diagnosis not present

## 2020-02-08 DIAGNOSIS — R7303 Prediabetes: Secondary | ICD-10-CM

## 2020-02-08 DIAGNOSIS — E559 Vitamin D deficiency, unspecified: Secondary | ICD-10-CM

## 2020-02-08 DIAGNOSIS — Z6841 Body Mass Index (BMI) 40.0 and over, adult: Secondary | ICD-10-CM

## 2020-02-08 DIAGNOSIS — Z9189 Other specified personal risk factors, not elsewhere classified: Secondary | ICD-10-CM | POA: Diagnosis not present

## 2020-02-08 MED ORDER — METFORMIN HCL 500 MG PO TABS: 500.0000 mg | ORAL_TABLET | Freq: Every day | ORAL | 1 refills | Status: DC

## 2020-02-08 MED ORDER — VITAMIN D (ERGOCALCIFEROL) 1.25 MG (50000 UNIT) PO CAPS: 50000.0000 [IU] | ORAL_CAPSULE | ORAL | 1 refills | Status: DC

## 2020-02-08 NOTE — Progress Notes (Signed)
Established Patient Office Visit  Subjective:  Patient ID: Mindy Collins, female    DOB: Sep 21, 1970  Age: 49 y.o. MRN: [id number]  CC:  Chief Complaint  Patient presents with  . Medication Refill    Patient states she needs a medication refill for vitamin D and metformin. Patient is also following up on leg pain and cholesterol.     HPI Mindy Collins presents for med refill  Prediabetes: has been taking metformin 500mg  PO qd ac. Requesting labs today. Will collect.  Vitamin D: has been out for some time. Chronic deficiency in the past. Wants to check level on labs but is feeling certain deficiency is ongoing.  Leg pain: was seen by [place], unfortunately meloxicam and methocarbamol ineffective. Developed more typical sciatic symptoms, went to urgent care, and started on low dose of prednisone. Has been effective. Much improvement. Given zanaflex as well - has not taken this yet - discussed safely using this medication and benefit it may add to prednisone.  Past Medical History:  Diagnosis Date  . Abdominal wall seroma   . Anemia   . Arthritis   . Asthma   . B12 deficiency   . Back pain   . Carpal tunnel syndrome, bilateral   . Chest pain   . Colitis   . Constipation   . Dysmenorrhea   . Dyspnea   . Fatty liver   . GERD (gastroesophageal reflux disease)   . HLD (hyperlipidemia)   . HPV (human papilloma virus) infection    WARTS/ TCA TX  . Hypertension   . Keratoconus   . Leg edema   . Obesity   . Obesity   . Osteoarthritis   . Palpitations   . Pre-diabetes   . Prediabetes   . Seasonal allergies   . Sleep apnea     Past Surgical History:  Procedure Laterality Date  . GYNECOLOGIC CRYOSURGERY  1993   DYSPLASIA CERVIX/   . HYSTEROSCOPY N/A 08/18/2019   Procedure: HYSTEROSCOPY WITH UNSUCCESSFUL  REMOVAL OF IUD AND DILATION OF THE CERVIX UNDER ULTRASOUND GUIDANCE;  Surgeon: Princess Bruins, MD;  Location: [facility];  Service: Gynecology;  Laterality: N/A;  request 8:30am OR  time in [place] held time for [facility] requests one hour  . MYOMECTOMY  02/17/2011   Procedure: MYOMECTOMY;  Surgeon: Terrance Mass, MD;  Location: [facility];  Service: Gynecology;  Laterality: N/A;  I am hoping to get a 7:30am time on Dec 4, Dec 11 or Dec 12. It not available I will check on some 1:00 pm times. Thanks  . MYOMECTOMY ABDOMINAL APPROACH  04/31/2007  . OVARIAN CYST REMOVAL  02/17/2011   Procedure: OVARIAN CYSTECTOMY;  Surgeon: Terrance Mass, MD;  Location: [facility];  Service: Gynecology;  Laterality: Left;    Family History  Problem Relation Age of Onset  . Hypertension Mother   . Breast cancer Mother   . Cancer Mother   . Hyperlipidemia Mother   . Thyroid disease Mother   . Obesity Mother   . Heart disease Maternal Grandmother   . Cancer Father        LUNG  . Breast cancer Maternal Aunt   . Diabetes Maternal Grandfather   . Cancer Paternal Aunt   . Ovarian cancer Paternal Aunt     Social History   Socioeconomic History  . Marital status: Single    Spouse name: Not on file  . Number of children: 0  . Years of education: Not on file  .  Highest education level: Not on file  Occupational History  . Occupation: Gaffer, Theatre manager college, admin    Employer: [employer]    Comment: [employer]  Tobacco Use  . Smoking status: Never Smoker  . Smokeless tobacco: Never Used  . Tobacco comment: denies tobacco   Vaping Use  . Vaping Use: Never used  Substance and Sexual Activity  . Alcohol use: No    Alcohol/week: 0.0 standard drinks  . Drug use: No  . Sexual activity: Yes    Birth control/protection: I.U.D.  Other Topics Concern  . Not on file  Social History Narrative   Graduate degree from [employer] in woman/gender studies.   Lives with her mother.   Social Determinants of Health   Financial Resource Strain: Not on file  Food Insecurity: Not on file  Transportation Needs: Not on file  Physical Activity: Not on file  Stress: Not on file  Social  Connections: Not on file  Intimate Partner Violence: Not on file    Outpatient Medications Prior to Visit  Medication Sig Dispense Refill  . atorvastatin (LIPITOR) 40 MG tablet Take 1 tablet (40 mg total) by mouth daily. 90 tablet 3  . fluticasone (FLONASE) 50 MCG/ACT nasal spray Place 1 spray into both nostrils 2 (two) times daily as needed (allergies.).   0  . levonorgestrel (MIRENA) 20 MCG/24HR IUD 1 each by Intrauterine route once.    . loratadine (CLARITIN) 10 MG tablet Take 10 mg by mouth daily as needed for allergies.     . predniSONE (DELTASONE) 10 MG tablet Take 3 tablets (30 mg total) by mouth daily with breakfast. 15 tablet 0  . tiZANidine (ZANAFLEX) 4 MG tablet Take 1 tablet (4 mg total) by mouth every 8 (eight) hours as needed. 30 tablet 0  . metFORMIN (GLUCOPHAGE) 500 MG tablet Take 1 tablet (500 mg total) by mouth daily with breakfast. 30 tablet 0  . Vitamin D, Ergocalciferol, (DRISDOL) 1.25 MG (50000 UNIT) CAPS capsule Take 1 capsule (50,000 Units total) by mouth every 3 (three) days. (Patient taking differently: Take 50,000 Units by mouth every 3 (three) days.) 8 capsule 0  . amoxicillin-clavulanate (AUGMENTIN) 875-125 MG tablet Take 1 tablet by mouth 2 (two) times daily. (Patient not taking: Reported on 02/08/2020) 14 tablet 0  . meloxicam (MOBIC) 15 MG tablet TAKE 1 TABLET BY MOUTH DAILY AS NEEDED (Patient not taking: Reported on 02/08/2020) 30 tablet 0  . olmesartan-hydrochlorothiazide (BENICAR HCT) 20-12.5 MG tablet Take 1 tablet by mouth daily. (Patient not taking: Reported on 02/08/2020) 90 tablet 3  . methocarbamol (ROBAXIN) 500 MG tablet Take 1 tablet (500 mg total) by mouth 4 (four) times daily. (Patient not taking: Reported on 02/08/2020) 60 tablet 0   No facility-administered medications prior to visit.    Allergies  Allergen Reactions  . Esomeprazole Magnesium Shortness Of Breath    Nervousness; tolerates Aciphex.  . Adhesive [Tape] Itching and Rash    Itching  and rash with adhesive tape  . Latex Itching and Dermatitis    Skin blistering  . Levocetirizine Dihydrochloride Other (See Comments)    Muscle jerks    ROS Review of Systems Per hpi     Objective:    Physical Exam Vitals and nursing note reviewed.  Constitutional:      General: She is not in acute distress.    Appearance: Normal appearance. She is normal weight. She is not ill-appearing, toxic-appearing or diaphoretic.  Cardiovascular:     Rate and Rhythm: Normal rate  and regular rhythm.     Heart sounds: Normal heart sounds. No murmur heard. No friction rub. No gallop.   Pulmonary:     Effort: Pulmonary effort is normal. No respiratory distress.     Breath sounds: Normal breath sounds. No stridor. No wheezing, rhonchi or rales.  Chest:     Chest wall: No tenderness.  Skin:    General: Skin is warm and dry.  Neurological:     General: No focal deficit present.     Mental Status: She is alert and oriented to person, place, and time. Mental status is at baseline.  Psychiatric:        Mood and Affect: Mood normal.        Behavior: Behavior normal.        Thought Content: Thought content normal.        Judgment: Judgment normal.     BP 115/75   Pulse 88   Temp 98.7 F (37.1 C) (Temporal)   Resp 18   Ht 5\' 2"  (1.575 m)   Wt 290 lb (131.5 kg)   SpO2 99%   BMI 53.04 kg/m  Wt Readings from Last 3 Encounters:  02/08/20 290 lb (131.5 kg)  01/12/20 289 lb 6.4 oz (131.3 kg)  11/09/19 290 lb (131.5 kg)     There are no preventive care reminders to display for this patient.  There are no preventive care reminders to display for this patient.  Lab Results  Component Value Date   TSH 3.190 09/07/2017   Lab Results  Component Value Date   WBC 11.8 (H) 10/04/2019   HGB 13.3 10/04/2019   HCT 41.0 10/04/2019   MCV 83 10/04/2019   PLT 430 (H) 08/12/2019   Lab Results  Component Value Date   NA 143 10/04/2019   K 3.5 10/04/2019   CO2 30 (H) 10/04/2019    GLUCOSE 88 10/04/2019   BUN 11 10/04/2019   CREATININE 0.72 10/04/2019   BILITOT 0.6 10/04/2019   ALKPHOS 124 (H) 10/04/2019   AST 53 (H) 10/04/2019   ALT 74 (H) 10/04/2019   PROT 7.3 10/04/2019   ALBUMIN 4.3 10/04/2019   CALCIUM 9.4 10/04/2019   ANIONGAP 11 08/12/2019   Lab Results  Component Value Date   CHOL 262 (H) 10/04/2019   Lab Results  Component Value Date   HDL 50 10/04/2019   Lab Results  Component Value Date   LDLCALC 189 (H) 10/04/2019   Lab Results  Component Value Date   TRIG 127 10/04/2019   Lab Results  Component Value Date   CHOLHDL 5.2 (H) 10/04/2019   Lab Results  Component Value Date   HGBA1C 6.0 (H) 08/12/2019      Assessment & Plan:   Problem List Items Addressed This Visit      Other   Prediabetes   Relevant Medications   metFORMIN (GLUCOPHAGE) 500 MG tablet   Vitamin D deficiency   Relevant Medications   Vitamin D, Ergocalciferol, (DRISDOL) 1.25 MG (50000 UNIT) CAPS capsule   Other Relevant Orders   Vitamin D, 25-hydroxy   Class 3 severe obesity with serious comorbidity and body mass index (BMI) of 50.0 to 59.9 in adult [employer]) - Primary   Relevant Medications   metFORMIN (GLUCOPHAGE) 500 MG tablet   Other Relevant Orders   Lipid panel   Hemoglobin A1c   Comprehensive metabolic panel    Other Visit Diagnoses    Other hyperlipidemia       Relevant Orders  Lipid panel   At risk for diabetes mellitus       Relevant Orders   Hemoglobin A1c   Comprehensive metabolic panel      Meds ordered this encounter  Medications  . Vitamin D, Ergocalciferol, (DRISDOL) 1.25 MG (50000 UNIT) CAPS capsule    Sig: Take 1 capsule (50,000 Units total) by mouth every 7 (seven) days.    Dispense:  12 capsule    Refill:  1    Order Specific Question:   Supervising Provider    Answer:   Carlota Raspberry, JEFFREY R [[id number]]  . metFORMIN (GLUCOPHAGE) 500 MG tablet    Sig: Take 1 tablet (500 mg total) by mouth daily with breakfast.    Dispense:  90 tablet     Refill:  1    Order Specific Question:   Supervising Provider    Answer:   Carlota Raspberry, JEFFREY R [[id number]]    Follow-up: No follow-ups on file.   PLAN  Labs collected. Will follow up with the patient as warranted.  Refill sent  Return prn or per lab indications  Patient encouraged to call clinic with any questions, comments, or concerns.  Maximiano Coss, NP

## 2020-02-08 NOTE — Patient Instructions (Signed)
° ° ° °  If you have lab work done today you will be contacted with your lab results within the next 2 weeks.  If you have not heard from us then please contact us. The fastest way to get your results is to register for My Chart. ° ° °IF you received an x-ray today, you will receive an invoice from [facility]. Please contact [facility] at [phone] with questions or concerns regarding your invoice.  ° °IF you received labwork today, you will receive an invoice from [facility]. Please contact [facility] at [phone] with questions or concerns regarding your invoice.  ° °Our billing staff will not be able to assist you with questions regarding bills from these companies. ° °You will be contacted with the lab results as soon as they are available. The fastest way to get your results is to activate your My Chart account. Instructions are located on the last page of this paperwork. If you have not heard from us regarding the results in 2 weeks, please contact this office. °  ° ° ° °

## 2020-02-09 LAB — LIPID PANEL
Chol/HDL Ratio: 3.1 ratio (ref 0.0–4.4)
Cholesterol, Total: 165 mg/dL (ref 100–199)
HDL: 53 mg/dL (ref >39)
LDL Chol Calc (NIH): 90 mg/dL (ref 0–99)
Triglycerides: 125 mg/dL (ref 0–149)
VLDL Cholesterol Cal: 22 mg/dL (ref 5–40)

## 2020-02-09 LAB — COMPREHENSIVE METABOLIC PANEL
ALT: 19 IU/L (ref 0–32)
AST: 13 IU/L (ref 0–40)
Albumin/Globulin Ratio: 1.3 (ref 1.2–2.2)
Albumin: 3.9 g/dL (ref 3.8–4.8)
Alkaline Phosphatase: 153 IU/L — ABNORMAL HIGH (ref 44–121)
BUN/Creatinine Ratio: 20 (ref 9–23)
BUN: 18 mg/dL (ref 6–24)
Bilirubin Total: 0.3 mg/dL (ref 0.0–1.2)
CO2: 28 mmol/L (ref 20–29)
Calcium: 9.2 mg/dL (ref 8.7–10.2)
Chloride: 102 mmol/L (ref 96–106)
Creatinine, Ser: 0.88 mg/dL (ref 0.57–1.00)
GFR calc Af Amer: 90 mL/min/{1.73_m2} (ref >59)
GFR calc non Af Amer: 78 mL/min/{1.73_m2} (ref >59)
Globulin, Total: 2.9 g/dL (ref 1.5–4.5)
Glucose: 92 mg/dL (ref 65–99)
Potassium: 3.5 mmol/L (ref 3.5–5.2)
Sodium: 143 mmol/L (ref 134–144)
Total Protein: 6.8 g/dL (ref 6.0–8.5)

## 2020-02-09 LAB — HEMOGLOBIN A1C
Est. average glucose Bld gHb Est-mCnc: 131 mg/dL
Hgb A1c MFr Bld: 6.2 % — ABNORMAL HIGH (ref 4.8–5.6)

## 2020-02-09 LAB — VITAMIN D 25 HYDROXY (VIT D DEFICIENCY, FRACTURES): Vit D, 25-Hydroxy: 14.6 ng/mL — ABNORMAL LOW (ref 30.0–100.0)

## 2020-02-15 ENCOUNTER — Encounter: Payer: Self-pay | Admitting: Registered Nurse

## 2020-02-17 ENCOUNTER — Ambulatory Visit: Payer: 59

## 2020-02-17 ENCOUNTER — Ambulatory Visit: Payer: Self-pay | Admitting: *Deleted

## 2020-02-17 NOTE — Telephone Encounter (Signed)
Attempted to contact patient.  She has question about timing of her COVID-19 test.  She has been exposed and has tes scheduled 02/20/19. Left VM for her to return call to speak with nurse to [phone]

## 2020-02-17 NOTE — Telephone Encounter (Signed)
Patient called and she says she was with some friends on 02/10/20 out to dinner and just found out that a couple of them have tested positive for COVID. She says she has an appointment on Monday, 02/20/20 for testing and that was the earliest appointment. She denies any symptoms and says she's been at home since then. She says they were out eating, so no masks were worn. I advised to monitor symptoms, keep scheduled appointment, call the office to schedule a virtual visit if develop symptoms or virtual visit online with [facility], care advice given, patient verbalized understanding.   Reason for Disposition . [1] CLOSE CONTACT COVID-19 EXPOSURE within last 14 days AND [2] NO symptoms  Answer Assessment - Initial Assessment Questions 1. COVID-19 EXPOSURE: "Please describe how you were exposed to someone with a COVID-19 infection."     Out in a public space eating dinner 2. PLACE of CONTACT: "Where were you when you were exposed to COVID-19?" (e.g., home, school, medical waiting room; which city?)     Out in a public space eating dinner 3. TYPE of CONTACT: "How much contact was there?" (e.g., sitting next to, live in same house, work in same office, same building)     Public space eating dinner 4. DURATION of CONTACT: "How long were you in contact with the COVID-19 patient?" (e.g., a few seconds, passed by person, a few minutes, 15 minutes or longer, live with the patient)     Over 1 hour 5. MASK: "Were you wearing a mask?" "Was the other person wearing a mask?" Note: wearing a mask reduces the risk of an otherwise close contact.     No 6. DATE of CONTACT: "When did you have contact with a COVID-19 patient?" (e.g., how many days ago)     02/10/20 7. COMMUNITY SPREAD: "Are there lots of cases of COVID-19 (community spread) where you live?" (See public health department website, if unsure)       Not sure 8. SYMPTOMS: "Do you have any symptoms?" (e.g., fever, cough, breathing difficulty, loss of  taste or smell)     No 9. VACCINE: "Have you gotten the COVID-19 vaccine?" If Yes ask: "Which one, how many shots, when did you get it?"     Yes, both vaccines 10. PREGNANCY OR POSTPARTUM: "Is there any chance you are pregnant?" "When was your last menstrual period?" "Did you deliver in the last 2 weeks?"       No 11. HIGH RISK: "Do you have any heart or lung problems?" "Do you have a weak immune system?" (e.g., heart failure, COPD, asthma, HIV positive, chemotherapy, renal failure, diabetes mellitus, sickle cell anemia, obesity)       No 12. TRAVEL: "Have you traveled out of the country recently?" If Yes, ask: "When and where?" Also ask about out-of-state travel, since the CDC has identified some high-risk cities for community spread in the [place]. Note: Travel becomes less relevant if there is widespread community transmission where the patient lives.       No  Protocols used: CORONAVIRUS (COVID-19) EXPOSURE-A-AH

## 2020-02-20 ENCOUNTER — Other Ambulatory Visit: Payer: 59

## 2020-02-20 NOTE — Telephone Encounter (Signed)
noted 

## 2020-02-29 ENCOUNTER — Telehealth: Payer: 59 | Admitting: Adult Health

## 2020-03-13 ENCOUNTER — Ambulatory Visit: Payer: 59 | Attending: Internal Medicine

## 2020-03-13 DIAGNOSIS — Z23 Encounter for immunization: Secondary | ICD-10-CM

## 2020-03-13 NOTE — Progress Notes (Signed)
   Covid-19 Vaccination Clinic  Name:  Mindy Collins    MRN: [id number] DOB: 09-05-1970  03/13/2020  [name] was observed post Covid-19 immunization for 15 minutes without incident. She was provided with Vaccine Information Sheet and instruction to access the V-Safe system.   [name] was instructed to call 911 with any severe reactions post vaccine: Marland Kitchen Difficulty breathing  . Swelling of face and throat  . A fast heartbeat  . A bad rash all over body  . Dizziness and weakness   Immunizations Administered    Name Date Dose VIS Date Route   PFIZER Comrnaty(Gray TOP) Covid-19 Vaccine 03/13/2020  1:15 PM 0.3 mL 01/19/2020 Intramuscular   Manufacturer: [employer]   Lot: [id number]   NDC: [id number]

## 2020-05-01 ENCOUNTER — Ambulatory Visit: Payer: 59 | Attending: Critical Care Medicine

## 2020-05-01 DIAGNOSIS — Z20822 Contact with and (suspected) exposure to covid-19: Secondary | ICD-10-CM

## 2020-05-02 LAB — NOVEL CORONAVIRUS, NAA: SARS-CoV-2, NAA: NOT DETECTED

## 2020-05-02 LAB — SARS-COV-2, NAA 2 DAY TAT

## 2020-05-09 ENCOUNTER — Ambulatory Visit: Payer: 59 | Attending: Internal Medicine

## 2020-05-09 DIAGNOSIS — Z20822 Contact with and (suspected) exposure to covid-19: Secondary | ICD-10-CM

## 2020-05-10 LAB — NOVEL CORONAVIRUS, NAA: SARS-CoV-2, NAA: NOT DETECTED

## 2020-05-10 LAB — SARS-COV-2, NAA 2 DAY TAT

## 2020-05-24 NOTE — Telephone Encounter (Signed)
Opened in error

## 2020-06-13 ENCOUNTER — Encounter: Payer: 59 | Admitting: Obstetrics & Gynecology

## 2020-06-27 ENCOUNTER — Ambulatory Visit: Payer: 59 | Attending: Critical Care Medicine

## 2020-06-27 DIAGNOSIS — Z20822 Contact with and (suspected) exposure to covid-19: Secondary | ICD-10-CM

## 2020-06-28 LAB — SARS-COV-2, NAA 2 DAY TAT

## 2020-06-28 LAB — NOVEL CORONAVIRUS, NAA: SARS-CoV-2, NAA: NOT DETECTED

## 2020-07-19 ENCOUNTER — Other Ambulatory Visit: Payer: Self-pay | Admitting: Registered Nurse

## 2020-07-19 DIAGNOSIS — R7303 Prediabetes: Secondary | ICD-10-CM

## 2020-08-06 ENCOUNTER — Ambulatory Visit (INDEPENDENT_AMBULATORY_CARE_PROVIDER_SITE_OTHER): Payer: 59 | Admitting: Obstetrics & Gynecology

## 2020-08-06 ENCOUNTER — Other Ambulatory Visit (HOSPITAL_COMMUNITY)
Admission: RE | Admit: 2020-08-06 | Discharge: 2020-08-06 | Disposition: A | Discharge status: Home / Self Care | Payer: 59 | Source: Ambulatory Visit | Attending: Obstetrics & Gynecology | Admitting: Obstetrics & Gynecology

## 2020-08-06 ENCOUNTER — Encounter: Payer: Self-pay | Admitting: Obstetrics & Gynecology

## 2020-08-06 ENCOUNTER — Other Ambulatory Visit: Payer: Self-pay

## 2020-08-06 VITALS — BP 124/80 | HR 114 | Ht 61.75 in | Wt 282.0 lb

## 2020-08-06 DIAGNOSIS — N882 Stricture and stenosis of cervix uteri: Secondary | ICD-10-CM

## 2020-08-06 DIAGNOSIS — Z538 Procedure and treatment not carried out for other reasons: Secondary | ICD-10-CM | POA: Diagnosis not present

## 2020-08-06 DIAGNOSIS — Z01419 Encounter for gynecological examination (general) (routine) without abnormal findings: Secondary | ICD-10-CM | POA: Insufficient documentation

## 2020-08-06 DIAGNOSIS — Z975 Presence of (intrauterine) contraceptive device: Secondary | ICD-10-CM

## 2020-08-06 NOTE — Progress Notes (Signed)
Mindy Collins 05/07/1970 [id number]   History:    50 y.o. G0 Single   RP:  Established patient presenting for annual gyn exam   HPI:  Severe cervical stenosis secondary to previous Cryotherapy/IUD inserted 07/2014, still in place as the cervical stenosis didn't allow [place]. No menstrual period.  No pelvic pain.  Normal vaginal secretions.  Abstinent.  Breasts normal.  Urine and bowel movements normal.  Entered the [facility] program for weight loss.  Will be on a low calorie diet with a physical activity program for the next year.  Body mass index 52.   Past medical history,surgical history, family history and social history were all reviewed and documented in the EPIC chart.  Gynecologic History No LMP recorded. (Menstrual status: IUD).  Obstetric History OB History  Gravida Para Term Preterm AB Living  0            SAB IAB Ectopic Multiple Live Births                ROS: A ROS was performed and pertinent positives and negatives are included in the history.  GENERAL: No fevers or chills. HEENT: No change in vision, no earache, sore throat or sinus congestion. NECK: No pain or stiffness. CARDIOVASCULAR: No chest pain or pressure. No palpitations. PULMONARY: No shortness of breath, cough or wheeze. GASTROINTESTINAL: No abdominal pain, nausea, vomiting or diarrhea, melena or bright red blood per rectum. GENITOURINARY: No urinary frequency, urgency, hesitancy or dysuria. MUSCULOSKELETAL: No joint or muscle pain, no back pain, no recent trauma. DERMATOLOGIC: No rash, no itching, no lesions. ENDOCRINE: No polyuria, polydipsia, no heat or cold intolerance. No recent change in weight. HEMATOLOGICAL: No anemia or easy bruising or bleeding. NEUROLOGIC: No headache, seizures, numbness, tingling or weakness. PSYCHIATRIC: No depression, no loss of interest in normal activity or change in sleep pattern.     Exam:   BP 124/80   Pulse (!) 114   Ht 5' 1.75" (1.568 m)   Wt 282 lb (127.9 kg)   BMI  52.00 kg/m   Body mass index is 52 kg/m.  General appearance : Well developed well nourished female. No acute distress HEENT: Eyes: no retinal hemorrhage or exudates,  Neck supple, trachea midline, no carotid bruits, no thyroidmegaly Lungs: Clear to auscultation, no rhonchi or wheezes, or rib retractions  Heart: Regular rate and rhythm, no murmurs or gallops Breast:Examined in sitting and supine position were symmetrical in appearance, no palpable masses or tenderness,  no skin retraction, no nipple inversion, no nipple discharge, no skin discoloration, no axillary or supraclavicular lymphadenopathy Abdomen: no palpable masses or tenderness, no rebound or guarding Extremities: no edema or skin discoloration or tenderness  Pelvic: Vulva: Normal             Vagina: No gross lesions or discharge  Cervix: No gross lesions or discharge.  Pap reflex done.  Uterus  AV, normal size, shape and consistency, non-tender and mobile  Adnexa  Without masses or tenderness  Anus: Normal   Assessment/Plan:  50 y.o. female for annual exam   1. Encounter for routine gynecological examination with Papanicolaou smear of cervix Normal gynecologic exam.  Pap reflex done.  Breast exam normal.  Screening mammogram June 2021 was negative.  Health labs with family nurse practitioner.  2. Unsuccessful attempt to remove intrauterine device (IUD) Will confirm intrauterine position of the IUD by ultrasound at follow-up.  Given the severe cervical stenosis, decision not to attempt further to remove the IUD  but just confirm annually its presence in the uterus. - US Transvaginal Non-OB; Future  3. Stenosis of cervix uteri  4. Morbid obesity ([place]) Starting the [facility] weight loss program.  Other orders - olmesartan (BENICAR) 20 MG tablet; Take 20 mg by mouth daily. - Multiple Vitamin (MULTIVITAMIN PO); Take by mouth.   Princess Bruins MD, 10:27 AM 08/06/2020

## 2020-08-07 LAB — CYTOLOGY - PAP: Diagnosis: NEGATIVE

## 2020-09-11 ENCOUNTER — Other Ambulatory Visit: Payer: 59

## 2020-09-11 ENCOUNTER — Other Ambulatory Visit: Payer: 59 | Admitting: Obstetrics & Gynecology

## 2020-09-11 DIAGNOSIS — Z0289 Encounter for other administrative examinations: Secondary | ICD-10-CM

## 2020-10-17 ENCOUNTER — Other Ambulatory Visit: Payer: Self-pay | Admitting: Registered Nurse

## 2020-10-17 DIAGNOSIS — E7849 Other hyperlipidemia: Secondary | ICD-10-CM

## 2020-10-25 ENCOUNTER — Other Ambulatory Visit: Payer: 59 | Admitting: Obstetrics & Gynecology

## 2020-10-25 ENCOUNTER — Other Ambulatory Visit: Payer: 59

## 2020-11-27 ENCOUNTER — Other Ambulatory Visit: Payer: Self-pay | Admitting: Obstetrics & Gynecology

## 2020-11-27 DIAGNOSIS — Z1231 Encounter for screening mammogram for malignant neoplasm of breast: Secondary | ICD-10-CM

## 2020-11-29 ENCOUNTER — Other Ambulatory Visit: Payer: 59

## 2020-11-29 ENCOUNTER — Other Ambulatory Visit: Payer: 59 | Admitting: Obstetrics & Gynecology

## 2020-11-30 ENCOUNTER — Ambulatory Visit: Payer: 59

## 2020-12-28 ENCOUNTER — Other Ambulatory Visit: Payer: Self-pay

## 2020-12-28 ENCOUNTER — Ambulatory Visit
Admission: RE | Admit: 2020-12-28 | Discharge: 2020-12-28 | Disposition: A | Discharge status: Home / Self Care | Payer: 59 | Source: Ambulatory Visit | Attending: Obstetrics & Gynecology | Admitting: Obstetrics & Gynecology

## 2020-12-28 DIAGNOSIS — Z1231 Encounter for screening mammogram for malignant neoplasm of breast: Secondary | ICD-10-CM

## 2021-01-10 ENCOUNTER — Encounter: Payer: Self-pay | Admitting: Obstetrics & Gynecology

## 2021-01-10 ENCOUNTER — Other Ambulatory Visit: Payer: Self-pay

## 2021-01-10 ENCOUNTER — Ambulatory Visit (INDEPENDENT_AMBULATORY_CARE_PROVIDER_SITE_OTHER): Payer: 59

## 2021-01-10 ENCOUNTER — Ambulatory Visit (INDEPENDENT_AMBULATORY_CARE_PROVIDER_SITE_OTHER): Payer: 59 | Admitting: Obstetrics & Gynecology

## 2021-01-10 VITALS — BP 110/70

## 2021-01-10 DIAGNOSIS — T8339XA Other mechanical complication of intrauterine contraceptive device, initial encounter: Secondary | ICD-10-CM | POA: Diagnosis not present

## 2021-01-10 DIAGNOSIS — Z538 Procedure and treatment not carried out for other reasons: Secondary | ICD-10-CM | POA: Diagnosis not present

## 2021-01-10 DIAGNOSIS — Z975 Presence of (intrauterine) contraceptive device: Secondary | ICD-10-CM

## 2021-01-10 NOTE — Progress Notes (Signed)
    Mindy Collins 02-10-1971 [id number]        50 y.o.  G0P0   RP: Previously unsuccessful IUD removal d/t stenotic cervix for Pelvic US  HPI: Mirena IUD x 2016.  Previously unsuccessful IUD removal d/t stenotic cervix secondary to h/o Cryotherapy of the cervix.  Unsuccessful HSC for removal.  No pelvic pain.  Amenorrheic.  Morbid obesity, starting a weight loss program at [facility].    OB History  Gravida Para Term Preterm AB Living  0            SAB IAB Ectopic Multiple Live Births               Past medical history,surgical history, problem list, medications, allergies, family history and social history were all reviewed and documented in the EPIC chart.   Directed ROS with pertinent positives and negatives documented in the history of present illness/assessment and plan.  Exam:  Vitals:   01/10/21 1420  BP: 110/70   General appearance:  Normal  Pelvic US today: T/V images.  Anteverted uterus with suboptimal visualization of the fundal portion.  The uterus is normal size and shape with no obvious myometrial mass identified.  The overall uterine size is measured at 8.71 x 5.08 x 4.64 cm.  The IUD appears to be in place within the cavity with both arms extended, suboptimal visualization.  The right ovary is not identified.  Left ovary is atrophic in appearance.  Avascular multiseptated cystic tubular structure noted in the right adnexa measured at 9.5 x 7 cm, smaller than on previous scan, probable right hydrosalpinx.  No free fluid in the pelvic cavity.   Assessment/Plan:  50 y.o. G0P0   1. Retained intrauterine contraceptive device (IUD) Pelvic US thoroughly reviewed with patient.  Patient reassured that her IUD is in normal IU location.  Uterus within normal. Normal atrophic left ovary.  Smaller right hydrosalpinx.  No free fluid in the pelvis.  2. Morbid obesity ([place]) Started a fitness program at the [facility].  Scheduled for counseling and management of Wt loss at the [facility]  weight Center.  Other orders - cyclobenzaprine (FLEXERIL) 5 MG tablet; Take 5 mg by mouth 3 (three) times daily as needed. (Patient not taking: Reported on 01/10/2021) - diclofenac (VOLTAREN) 75 MG EC tablet; Take 75 mg by mouth 2 (two) times daily. - VITAMIN D PO; Take by mouth.   Princess Bruins MD, 2:49 PM 01/10/2021

## 2021-01-15 ENCOUNTER — Other Ambulatory Visit: Payer: Self-pay | Admitting: Registered Nurse

## 2021-01-15 DIAGNOSIS — R7303 Prediabetes: Secondary | ICD-10-CM

## 2021-01-16 ENCOUNTER — Encounter: Payer: Self-pay | Admitting: Obstetrics & Gynecology

## 2021-01-17 ENCOUNTER — Other Ambulatory Visit: Payer: Self-pay | Admitting: Registered Nurse

## 2021-01-17 DIAGNOSIS — E559 Vitamin D deficiency, unspecified: Secondary | ICD-10-CM

## 2021-01-30 ENCOUNTER — Encounter (INDEPENDENT_AMBULATORY_CARE_PROVIDER_SITE_OTHER): Payer: Self-pay

## 2021-01-31 ENCOUNTER — Encounter (INDEPENDENT_AMBULATORY_CARE_PROVIDER_SITE_OTHER): Payer: Self-pay

## 2021-02-06 DIAGNOSIS — Z0289 Encounter for other administrative examinations: Secondary | ICD-10-CM

## 2021-02-14 ENCOUNTER — Ambulatory Visit (INDEPENDENT_AMBULATORY_CARE_PROVIDER_SITE_OTHER): Payer: 59 | Admitting: Family Medicine

## 2021-02-14 ENCOUNTER — Other Ambulatory Visit: Payer: Self-pay

## 2021-02-14 ENCOUNTER — Encounter (INDEPENDENT_AMBULATORY_CARE_PROVIDER_SITE_OTHER): Payer: Self-pay

## 2021-02-14 ENCOUNTER — Other Ambulatory Visit: Payer: Self-pay | Admitting: Registered Nurse

## 2021-02-14 DIAGNOSIS — R7303 Prediabetes: Secondary | ICD-10-CM

## 2021-02-28 ENCOUNTER — Ambulatory Visit (INDEPENDENT_AMBULATORY_CARE_PROVIDER_SITE_OTHER): Payer: 59 | Admitting: Family Medicine

## 2021-03-05 ENCOUNTER — Ambulatory Visit (INDEPENDENT_AMBULATORY_CARE_PROVIDER_SITE_OTHER): Payer: 59 | Admitting: Family Medicine

## 2021-03-06 ENCOUNTER — Other Ambulatory Visit: Payer: Self-pay

## 2021-03-06 ENCOUNTER — Encounter (INDEPENDENT_AMBULATORY_CARE_PROVIDER_SITE_OTHER): Payer: Self-pay | Admitting: Family Medicine

## 2021-03-06 ENCOUNTER — Ambulatory Visit (INDEPENDENT_AMBULATORY_CARE_PROVIDER_SITE_OTHER): Payer: 59 | Admitting: Family Medicine

## 2021-03-06 VITALS — BP 136/80 | HR 86 | Temp 98.7°F | Ht 62.0 in | Wt 278.0 lb

## 2021-03-06 DIAGNOSIS — I10 Essential (primary) hypertension: Secondary | ICD-10-CM | POA: Diagnosis not present

## 2021-03-06 DIAGNOSIS — E7849 Other hyperlipidemia: Secondary | ICD-10-CM | POA: Diagnosis not present

## 2021-03-06 DIAGNOSIS — R7303 Prediabetes: Secondary | ICD-10-CM | POA: Diagnosis not present

## 2021-03-06 DIAGNOSIS — R0602 Shortness of breath: Secondary | ICD-10-CM

## 2021-03-06 DIAGNOSIS — Z1331 Encounter for screening for depression: Secondary | ICD-10-CM | POA: Diagnosis not present

## 2021-03-06 DIAGNOSIS — E669 Obesity, unspecified: Secondary | ICD-10-CM

## 2021-03-06 DIAGNOSIS — E559 Vitamin D deficiency, unspecified: Secondary | ICD-10-CM | POA: Diagnosis not present

## 2021-03-06 DIAGNOSIS — E66813 Obesity, class 3: Secondary | ICD-10-CM

## 2021-03-06 DIAGNOSIS — Z6841 Body Mass Index (BMI) 40.0 and over, adult: Secondary | ICD-10-CM

## 2021-03-06 DIAGNOSIS — R5383 Other fatigue: Secondary | ICD-10-CM

## 2021-03-06 NOTE — Progress Notes (Addendum)
Chief Complaint:   OBESITY Mindy Collins (MR# [id number]) is a 51 y.o. female who presents for evaluation and treatment of obesity and related comorbidities. Current BMI is Body mass index is 50.85 kg/m. Mindy Collins has been struggling with her weight for many years and has been unsuccessful in either losing weight, maintaining weight loss, or reaching her healthy weight goal.  Mindy Collins's last visit with [place] was approximately 18 months ago. In the meanwhile, she did OptiFast at [facility] but she couldn't afford the cost. Unfortunately her RMR has dropped since her first visit here at similar weight.   Mindy Collins is currently in the action stage of change and ready to dedicate time achieving and maintaining a healthier weight. Mindy Collins is interested in becoming our patient and working on intensive lifestyle modifications including (but not limited to) diet and exercise for weight loss.  Mindy Collins's habits were reviewed today and are as follows: her heaviest weight ever was 310 pounds, she is a picky eater and doesn't like to eat healthier foods, she has significant food cravings issues, she skips meals frequently, she frequently eats larger portions than normal, and she struggles with emotional eating.  Depression Screen Mindy Collins's Food and Mood (modified PHQ-9) score was 19.  Depression screen [facility] 2/9 03/06/2021  Decreased Interest 3  Down, Depressed, Hopeless 2  PHQ - 2 Score 5  Altered sleeping 3  Tired, decreased energy 3  Change in appetite 1  Feeling bad or failure about yourself  3  Trouble concentrating 1  Moving slowly or fidgety/restless 3  Suicidal thoughts 0  PHQ-9 Score 19  Difficult doing work/chores Not difficult at all  Some recent data might be hidden   Subjective:   1. Other fatigue Mindy Collins admits to daytime somnolence and admits to waking up still tired. Patent has a history of symptoms of daytime fatigue, morning fatigue, and morning headache. Mindy Collins generally gets 4 hours of sleep  per night, and states that she has difficulty falling asleep. Snoring is present. Apneic episodes are present. Epworth Sleepiness Score is 4.  2. SOB (shortness of breath) on exertion Mindy Collins notes increasing shortness of breath with exercising and seems to be worsening over time with weight gain. She notes getting out of breath sooner with activity than she used to. This has not gotten worse recently. Mindy Collins denies shortness of breath at rest or orthopnea.  3. Essential hypertension Mindy Collins's blood pressure is stable. She was taken off her hydrochlorothiazide while on her modified fast. She feels she is retaining more fluid.  4. Vitamin D deficiency Mindy Collins's last Vitamin D level was very low at 14.6, and she is due for labs.  5. Other hyperlipidemia Mindy Collins is working on diet and weight loss, and she is due for labs.  6. Pre-diabetes Mindy Collins is on metformin, and she is working on her diet. Her last A1c was elevated at 6.2.  Assessment/Plan:   1. Other fatigue Mindy Collins does feel that her weight is causing her energy to be lower than it should be. Fatigue may be related to obesity, depression or many other causes. Labs will be ordered, and in the meanwhile, Mindy Collins will focus on self care including making healthy food choices, increasing physical activity and focusing on stress reduction.  - CBC with Differential/Platelet - Folate - T3, free - T4, free - TSH - Vitamin B12  2. SOB (shortness of breath) on exertion Mindy Collins does feel that she gets out of breath more easily that she used to when  she exercises. Mindy Collins's shortness of breath appears to be obesity related and exercise induced. She has agreed to work on weight loss and gradually increase exercise to treat her exercise induced shortness of breath. Will continue to monitor closely.  3. Essential hypertension We will check labs today. Mindy Collins will continue working on healthy weight loss and exercise to improve blood pressure control.   4. Vitamin  D deficiency Low Vitamin D level contributes to fatigue and are associated with obesity, breast, and colon cancer. We will check labs today. Mindy Collins will follow-up for routine testing of Vitamin D, at least 2-3 times per year to avoid over-replacement.  - VITAMIN D 25 Hydroxy (Vit-D Deficiency, Fractures)  5. Other hyperlipidemia Cardiovascular risk and specific lipid/LDL goals reviewed. We will check labs today. Mindy Collins will continue to work on diet, exercise and weight loss efforts. Orders and follow up as documented in patient record.   - Lipid panel  6. Pre-diabetes We will check labs today. Mindy Collins will continue to work on weight loss, exercise, and decreasing simple carbohydrates to help decrease the risk of diabetes.   - Comprehensive metabolic panel - Hemoglobin A1c - Insulin, random  7. Screening for depression Mindy Collins had a positive depression screening. Depression is commonly associated with obesity and often results in emotional eating behaviors. We will monitor this closely and work on CBT to help improve the non-hunger eating patterns. Referral to Psychology may be required if no improvement is seen as she continues in our clinic.  8. Obesity with current BMI of 50.8 Mindy Collins is currently in the action stage of change and her goal is to continue with weight loss efforts. I recommend Mindy Collins begin the structured treatment plan as follows:  She has agreed to change to keeping a food journal and adhering to recommended goals of 1300-1500 calories and 85+ grams of protein daily.  Exercise goals: No exercise has been prescribed for now, while we concentrate on nutritional changes.  Behavioral modification strategies: increasing lean protein intake, meal planning and cooking strategies, and keeping a strict food journal.  She was informed of the importance of frequent follow-up visits to maximize her success with intensive lifestyle modifications for her multiple health conditions. She was  informed we would discuss her lab results at her next visit unless there is a critical issue that needs to be addressed sooner. Mindy Collins agreed to keep her next visit at the agreed upon time to discuss these results.  Objective:   Blood pressure 136/80, pulse 86, temperature 98.7 F (37.1 C), height 5\' 2"  (1.575 m), weight 278 lb (126.1 kg), SpO2 100 %. Body mass index is 50.85 kg/m.  EKG: Normal sinus rhythm, rate (unable to obtain).  Indirect Calorimeter completed today shows a VO2 of 238 and a REE of 1642.  Her calculated basal metabolic rate is 1610 thus her basal metabolic rate is worse than expected.  General: Cooperative, alert, well developed, in no acute distress. HEENT: Conjunctivae and lids unremarkable. Cardiovascular: Regular rhythm.  Lungs: Normal work of breathing. Neurologic: No focal deficits.   Lab Results  Component Value Date   CREATININE 0.88 02/08/2020   BUN 18 02/08/2020   NA 143 02/08/2020   K 3.5 02/08/2020   CL 102 02/08/2020   CO2 28 02/08/2020   Lab Results  Component Value Date   ALT 19 02/08/2020   AST 13 02/08/2020   ALKPHOS 153 (H) 02/08/2020   BILITOT 0.3 02/08/2020   Lab Results  Component Value Date   HGBA1C  6.2 (H) 02/08/2020   HGBA1C 6.0 (H) 08/12/2019   HGBA1C 5.8 (H) 02/23/2019   HGBA1C 5.9 (H) 09/01/2018   HGBA1C 5.9 (H) 12/21/2017   Lab Results  Component Value Date   INSULIN 3.4 02/23/2019   INSULIN 19.3 09/01/2018   INSULIN 32.3 (H) 12/21/2017   INSULIN 14.8 09/07/2017   Lab Results  Component Value Date   TSH 3.190 09/07/2017   Lab Results  Component Value Date   CHOL 165 02/08/2020   HDL 53 02/08/2020   LDLCALC 90 02/08/2020   LDLDIRECT 141.6 04/16/2010   TRIG 125 02/08/2020   CHOLHDL 3.1 02/08/2020   Lab Results  Component Value Date   WBC 11.8 (H) 10/04/2019   HGB 13.3 10/04/2019   HCT 41.0 10/04/2019   MCV 83 10/04/2019   PLT 430 (H) 08/12/2019   No results found for: IRON, TIBC, FERRITIN  Attestation  Statements:   Reviewed by clinician on day of visit: allergies, medications, problem list, medical history, surgical history, family history, social history, and previous encounter notes.   45 minutes were spent on the visit including pre and post visit chart review and documentation  I, Trixie Dredge, am acting as transcriptionist for Dennard Nip, MD.  I have reviewed the above documentation for accuracy and completeness, and I agree with the above. - Dennard Nip, MD

## 2021-03-07 LAB — LIPID PANEL
Chol/HDL Ratio: 2.9 ratio (ref 0.0–4.4)
Cholesterol, Total: 148 mg/dL (ref 100–199)
HDL: 51 mg/dL (ref >39)
LDL Chol Calc (NIH): 81 mg/dL (ref 0–99)
Triglycerides: 83 mg/dL (ref 0–149)
VLDL Cholesterol Cal: 16 mg/dL (ref 5–40)

## 2021-03-07 LAB — CBC WITH DIFFERENTIAL/PLATELET
Basophils Absolute: 0 10*3/uL (ref 0.0–0.2)
Basos: 0 %
EOS (ABSOLUTE): 0.1 10*3/uL (ref 0.0–0.4)
Eos: 1 %
Hematocrit: 41.4 % (ref 34.0–46.6)
Hemoglobin: 13.3 g/dL (ref 11.1–15.9)
Immature Grans (Abs): 0 10*3/uL (ref 0.0–0.1)
Immature Granulocytes: 0 %
Lymphocytes Absolute: 2.2 10*3/uL (ref 0.7–3.1)
Lymphs: 21 %
MCH: 26.5 pg — ABNORMAL LOW (ref 26.6–33.0)
MCHC: 32.1 g/dL (ref 31.5–35.7)
MCV: 83 fL (ref 79–97)
Monocytes Absolute: 0.8 10*3/uL (ref 0.1–0.9)
Monocytes: 8 %
Neutrophils Absolute: 7.3 10*3/uL — ABNORMAL HIGH (ref 1.4–7.0)
Neutrophils: 70 %
Platelets: 396 10*3/uL (ref 150–450)
RBC: 5.01 x10E6/uL (ref 3.77–5.28)
RDW: 14 % (ref 11.7–15.4)
WBC: 10.5 10*3/uL (ref 3.4–10.8)

## 2021-03-07 LAB — COMPREHENSIVE METABOLIC PANEL
ALT: 24 IU/L (ref 0–32)
AST: 26 IU/L (ref 0–40)
Albumin/Globulin Ratio: 1.6 (ref 1.2–2.2)
Albumin: 4.3 g/dL (ref 3.8–4.8)
Alkaline Phosphatase: 149 IU/L — ABNORMAL HIGH (ref 44–121)
BUN/Creatinine Ratio: 13 (ref 9–23)
BUN: 10 mg/dL (ref 6–24)
Bilirubin Total: 0.8 mg/dL (ref 0.0–1.2)
CO2: 28 mmol/L (ref 20–29)
Calcium: 9.4 mg/dL (ref 8.7–10.2)
Chloride: 105 mmol/L (ref 96–106)
Creatinine, Ser: 0.76 mg/dL (ref 0.57–1.00)
Globulin, Total: 2.7 g/dL (ref 1.5–4.5)
Glucose: 100 mg/dL — ABNORMAL HIGH (ref 70–99)
Potassium: 4.4 mmol/L (ref 3.5–5.2)
Sodium: 146 mmol/L — ABNORMAL HIGH (ref 134–144)
Total Protein: 7 g/dL (ref 6.0–8.5)
eGFR: 97 mL/min/{1.73_m2} (ref >59)

## 2021-03-07 LAB — HEMOGLOBIN A1C
Est. average glucose Bld gHb Est-mCnc: 128 mg/dL
Hgb A1c MFr Bld: 6.1 % — ABNORMAL HIGH (ref 4.8–5.6)

## 2021-03-07 LAB — T4, FREE: Free T4: 1.2 ng/dL (ref 0.82–1.77)

## 2021-03-07 LAB — VITAMIN D 25 HYDROXY (VIT D DEFICIENCY, FRACTURES): Vit D, 25-Hydroxy: 23.4 ng/mL — ABNORMAL LOW (ref 30.0–100.0)

## 2021-03-07 LAB — VITAMIN B12: Vitamin B-12: 226 pg/mL — ABNORMAL LOW (ref 232–1245)

## 2021-03-07 LAB — FOLATE: Folate: 7.3 ng/mL (ref >3.0)

## 2021-03-07 LAB — T3, FREE: T3, Free: 3.2 pg/mL (ref 2.0–4.4)

## 2021-03-07 LAB — TSH: TSH: 2.39 u[IU]/mL (ref 0.450–4.500)

## 2021-03-07 LAB — INSULIN, RANDOM: INSULIN: 19.5 u[IU]/mL (ref 2.6–24.9)

## 2021-03-20 ENCOUNTER — Other Ambulatory Visit: Payer: Self-pay

## 2021-03-20 ENCOUNTER — Ambulatory Visit (INDEPENDENT_AMBULATORY_CARE_PROVIDER_SITE_OTHER): Payer: 59 | Admitting: Family Medicine

## 2021-03-20 ENCOUNTER — Encounter (INDEPENDENT_AMBULATORY_CARE_PROVIDER_SITE_OTHER): Payer: Self-pay | Admitting: Family Medicine

## 2021-03-20 VITALS — BP 122/77 | HR 82 | Temp 98.3°F | Ht 62.0 in | Wt 276.0 lb

## 2021-03-20 DIAGNOSIS — Z9189 Other specified personal risk factors, not elsewhere classified: Secondary | ICD-10-CM

## 2021-03-20 DIAGNOSIS — Z6841 Body Mass Index (BMI) 40.0 and over, adult: Secondary | ICD-10-CM

## 2021-03-20 DIAGNOSIS — E559 Vitamin D deficiency, unspecified: Secondary | ICD-10-CM | POA: Diagnosis not present

## 2021-03-20 DIAGNOSIS — R5383 Other fatigue: Secondary | ICD-10-CM

## 2021-03-20 DIAGNOSIS — E538 Deficiency of other specified B group vitamins: Secondary | ICD-10-CM

## 2021-03-20 DIAGNOSIS — R7303 Prediabetes: Secondary | ICD-10-CM

## 2021-03-20 DIAGNOSIS — F439 Reaction to severe stress, unspecified: Secondary | ICD-10-CM | POA: Diagnosis not present

## 2021-03-20 DIAGNOSIS — E669 Obesity, unspecified: Secondary | ICD-10-CM

## 2021-03-20 DIAGNOSIS — R0602 Shortness of breath: Secondary | ICD-10-CM

## 2021-03-20 MED ORDER — BUPROPION HCL ER (SR) 150 MG PO TB12: 150.0000 mg | ORAL_TABLET | Freq: Every day | ORAL | 0 refills | Status: DC

## 2021-03-20 MED ORDER — VITAMIN D (ERGOCALCIFEROL) 1.25 MG (50000 UNIT) PO CAPS: 50000.0000 [IU] | ORAL_CAPSULE | ORAL | 0 refills | Status: DC

## 2021-03-20 NOTE — Progress Notes (Signed)
Chief Complaint:   OBESITY Mindy Collins is here to discuss her progress with her obesity treatment plan along with follow-up of her obesity related diagnoses. Mindy Collins is on keeping a food journal and adhering to recommended goals of 1300-1500 calories and 85+ grams of protein daily and states she is following her eating plan approximately 0% of the time. Mindy Collins states she is doing 0 minutes 0 times per week.  Today's visit was #: 2 Starting weight: 278 lbs Starting date: 03/06/2021 Today's weight: 276 lbs Today's date: 03/20/2021 Total lbs lost to date: 2 Total lbs lost since last in-office visit: 2  Interim History: Mindy Collins was unable to journal as agreed due to work stress. She tried to be mindful and portion control however. She has been skipping meals and unable to meal plan.  Subjective:   1. Pre-diabetes Mindy Collins's A1c is elevated at 6.1. She is on metformin and she is working on weight loss. I discussed labs with the patient today.  2. Vitamin D deficiency Mindy Collins's Vit D level is very low and is worsening at 23. She notes fatigue. I discussed labs with the patient today.  3. Vitamin B12 deficiency Mindy Collins's B12 level is very low and worsening at 226. She has no history of pernicious anemia. I discussed labs with the patient today.  4. Stress Mindy Collins has increased work stress, this is affecting her eating and meal planning.  5. At risk for impaired metabolic function Mindy Collins is at increased risk for impaired metabolic function if protein and calories are too low.  Assessment/Plan:   1. Pre-diabetes Mindy Collins will continue to work on weight loss, exercise, and decreasing simple carbohydrates to help decrease the risk of diabetes.   2. Vitamin D deficiency Low Vitamin D level contributes to fatigue and are associated with obesity, breast, and colon cancer. Mindy Collins agreed to start prescription Vitamin D 50,000 IU every week with no refills. She will follow-up for routine testing of Vitamin D, at  least 2-3 times per year to avoid over-replacement.  - Vitamin D, Ergocalciferol, (DRISDOL) 1.25 MG (50000 UNIT) CAPS capsule; Take 1 capsule (50,000 Units total) by mouth every 7 (seven) days.  Dispense: 4 capsule; Refill: 0  3. Vitamin B12 deficiency The diagnosis was reviewed with the patient. Mindy Collins agreed to start OTC B12 1,000 mcg daily. Orders and follow up as documented in patient record.  4. Stress Mindy Collins agreed to start Wellbutrin SR 150 mg q AM with no refills. We will follow up at her next visit.  - buPROPion (WELLBUTRIN SR) 150 MG 12 hr tablet; Take 1 tablet (150 mg total) by mouth daily. Take one tablet in the mornings.  Dispense: 30 tablet; Refill: 0  5. At risk for impaired metabolic function Mindy Collins was given approximately 15 minutes of impaired  metabolic function prevention counseling today. We discussed intensive lifestyle modifications today with an emphasis on specific nutrition and exercise instructions and strategies.   Repetitive spaced learning was employed today to elicit superior memory formation and behavioral change.  6. Obesity with current BMI of 50.6 Mindy Collins is currently in the action stage of change. As such, her goal is to continue with weight loss efforts. She has agreed to keeping a food journal and adhering to recommended goals of 1300-1500 calories and 85+ grams of protein daily.   Mindy Collins will work on stress reduction and not skip meal.  Behavioral modification strategies: increasing lean protein intake and meal planning and cooking strategies.  Mindy Collins has agreed to follow-up with  our clinic in 2 weeks. She was informed of the importance of frequent follow-up visits to maximize her success with intensive lifestyle modifications for her multiple health conditions.   Objective:   Blood pressure 122/77, pulse 82, temperature 98.3 F (36.8 C), height 5\' 2"  (1.575 m), weight 276 lb (125.2 kg), SpO2 100 %. Body mass index is 50.48 kg/m.  General: Cooperative,  alert, well developed, in no acute distress. HEENT: Conjunctivae and lids unremarkable. Cardiovascular: Regular rhythm.  Lungs: Normal work of breathing. Neurologic: No focal deficits.   Lab Results  Component Value Date   CREATININE 0.76 03/06/2021   BUN 10 03/06/2021   NA 146 (H) 03/06/2021   K 4.4 03/06/2021   CL 105 03/06/2021   CO2 28 03/06/2021   Lab Results  Component Value Date   ALT 24 03/06/2021   AST 26 03/06/2021   ALKPHOS 149 (H) 03/06/2021   BILITOT 0.8 03/06/2021   Lab Results  Component Value Date   HGBA1C 6.1 (H) 03/06/2021   HGBA1C 6.2 (H) 02/08/2020   HGBA1C 6.0 (H) 08/12/2019   HGBA1C 5.8 (H) 02/23/2019   HGBA1C 5.9 (H) 09/01/2018   Lab Results  Component Value Date   INSULIN 19.5 03/06/2021   INSULIN 3.4 02/23/2019   INSULIN 19.3 09/01/2018   INSULIN 32.3 (H) 12/21/2017   INSULIN 14.8 09/07/2017   Lab Results  Component Value Date   TSH 2.390 03/06/2021   Lab Results  Component Value Date   CHOL 148 03/06/2021   HDL 51 03/06/2021   LDLCALC 81 03/06/2021   LDLDIRECT 141.6 04/16/2010   TRIG 83 03/06/2021   CHOLHDL 2.9 03/06/2021   Lab Results  Component Value Date   VD25OH 23.4 (L) 03/06/2021   VD25OH 14.6 (L) 02/08/2020   VD25OH 20.2 (L) 07/13/2019   Lab Results  Component Value Date   WBC 10.5 03/06/2021   HGB 13.3 03/06/2021   HCT 41.4 03/06/2021   MCV 83 03/06/2021   PLT 396 03/06/2021   No results found for: IRON, TIBC, FERRITIN  Attestation Statements:   Reviewed by clinician on day of visit: allergies, medications, problem list, medical history, surgical history, family history, social history, and previous encounter notes.   I, Trixie Dredge, am acting as transcriptionist for Dennard Nip, MD.  I have reviewed the above documentation for accuracy and completeness, and I agree with the above. -  Dennard Nip, MD

## 2021-04-08 ENCOUNTER — Ambulatory Visit (INDEPENDENT_AMBULATORY_CARE_PROVIDER_SITE_OTHER): Payer: 59 | Admitting: Adult Health

## 2021-04-08 ENCOUNTER — Encounter (INDEPENDENT_AMBULATORY_CARE_PROVIDER_SITE_OTHER): Payer: Self-pay | Admitting: Adult Health

## 2021-04-08 ENCOUNTER — Other Ambulatory Visit: Payer: Self-pay

## 2021-04-08 VITALS — BP 134/83 | HR 82 | Temp 98.0°F | Ht 62.0 in | Wt 276.0 lb

## 2021-04-08 DIAGNOSIS — Z6841 Body Mass Index (BMI) 40.0 and over, adult: Secondary | ICD-10-CM

## 2021-04-08 DIAGNOSIS — I10 Essential (primary) hypertension: Secondary | ICD-10-CM

## 2021-04-08 DIAGNOSIS — R197 Diarrhea, unspecified: Secondary | ICD-10-CM

## 2021-04-08 DIAGNOSIS — E669 Obesity, unspecified: Secondary | ICD-10-CM | POA: Diagnosis not present

## 2021-04-08 DIAGNOSIS — Z9189 Other specified personal risk factors, not elsewhere classified: Secondary | ICD-10-CM

## 2021-04-08 DIAGNOSIS — F439 Reaction to severe stress, unspecified: Secondary | ICD-10-CM

## 2021-04-09 NOTE — Progress Notes (Signed)
Chief Complaint:   OBESITY Mindy Collins is here to discuss her progress with her obesity treatment plan along with follow-up of her obesity related diagnoses. Mindy Collins is on keeping a food journal and adhering to recommended goals of 1300-1500 calories and 85+ grams of protein and states she is following her eating plan approximately 0% of the time. Mindy Collins states she is not exercising regularly at this time.  Today's visit was #: 3 Starting weight: 278 lbs Starting date: 03/06/2021 Today's weight: 276 lbs Today's date: 04/08/2021 Total lbs lost to date: 2 lbs Total lbs lost since last in-office visit: 0  Interim History:  She reports intermittent abdominal cramping with loose stools since early Jan 2023. She recently experienced an occurrence that she believes is r/t to new medication- Buproprion SR 150mg .  Of note - She participated in [facility]- Optifast 800 calorie meal replacement (cost prohibitive).   Last office visit May 2022. While in the program her antihypertensive was changed- Benicar/HCTZ 20/12.5 mg converted to  Benicar 20 mg daily.  Last contact with PCP 01/2020  Subjective:   1. Diarrhea, unspecified type She reports intermittent abdominal cramping with loose stools since early Jan 2023. She recently experienced an occurrence that she believes is r/t to new medication- Buproprion SR 150mg . She reports remote hx of colitis 06/2009 that required hospitalization. Per pt she has never had a colonoscopy. She denies known family hx of colon cancer.  On 03/20/2021 - started on bupropion SR 150mg  daily due to increased work stress. She took dose 2/10, 2/11, 2/12, experienced diarrhea on 2/13 She has been off bupropion SR 150mg  since 21/3, she then experienced another occurrence of diarrhea on 2/26. She denies current GI upset at present.  2. Essential hypertension She has been on Benicar 20 mg - every other day. She denies acute cardiac sx's.  3.  Stress On 03/20/2021 - started on bupropion SR 150mg  daily due to increased work stress. She took dose 2/10, 2/11, 2/12, experienced diarrhea on 2/13 She has been off bupropion SR 150mg  since 21/3, she then experienced another occurrence of diarrhea on 2/26. She denies current GI upset at present. She reports continued sx's of stress.  4. At risk for deficient intake of food Mindy Collins is at risk for deficient intake of food due to diarrhea.  Assessment/Plan:   1. Diarrhea, unspecified type Referral to GI placed.  - Ambulatory referral to Gastroenterology  2. Essential hypertension Take Benicar 20 mg daily.  3. Stress Restart Bupropion SR 150mg  QD.  4. At risk for deficient intake of food Mindy Collins was given approximately 15 minutes of deficient intake of food prevention counseling today. Mindy Collins is at risk for eating too few calories based on current food recall. She was encouraged to focus on meeting caloric and protein goals according to her recommended meal plan.  5. Obesity with current BMI of 50.6  Mindy Collins is currently in the action stage of change. As such, her goal is to continue with weight loss efforts. She has agreed to keeping a food journal and adhering to recommended goals of 1300-1500 calories and 85 grams of protein.   Exercise goals:  As is.  Behavioral modification strategies: increasing lean protein intake, decreasing simple carbohydrates, meal planning and cooking strategies, keeping healthy foods in the home, and planning for success.  Mindy Collins has agreed to follow-up with our clinic in 2 weeks. She was informed of the importance of frequent follow-up visits to maximize her success with intensive  lifestyle modifications for her multiple health conditions.   Objective:   Blood pressure 134/83, pulse 82, temperature 98 F (36.7 C), height 5\' 2"  (1.575 m), weight 276 lb (125.2 kg), SpO2 98 %. Body mass index is 50.48 kg/m.  General: Cooperative, alert, well developed, in no  acute distress. HEENT: Conjunctivae and lids unremarkable. Cardiovascular: Regular rhythm.  Lungs: Normal work of breathing. Neurologic: No focal deficits.   Lab Results  Component Value Date   CREATININE 0.76 03/06/2021   BUN 10 03/06/2021   NA 146 (H) 03/06/2021   K 4.4 03/06/2021   CL 105 03/06/2021   CO2 28 03/06/2021   Lab Results  Component Value Date   ALT 24 03/06/2021   AST 26 03/06/2021   ALKPHOS 149 (H) 03/06/2021   BILITOT 0.8 03/06/2021   Lab Results  Component Value Date   HGBA1C 6.1 (H) 03/06/2021   HGBA1C 6.2 (H) 02/08/2020   HGBA1C 6.0 (H) 08/12/2019   HGBA1C 5.8 (H) 02/23/2019   HGBA1C 5.9 (H) 09/01/2018   Lab Results  Component Value Date   INSULIN 19.5 03/06/2021   INSULIN 3.4 02/23/2019   INSULIN 19.3 09/01/2018   INSULIN 32.3 (H) 12/21/2017   INSULIN 14.8 09/07/2017   Lab Results  Component Value Date   TSH 2.390 03/06/2021   Lab Results  Component Value Date   CHOL 148 03/06/2021   HDL 51 03/06/2021   LDLCALC 81 03/06/2021   LDLDIRECT 141.6 04/16/2010   TRIG 83 03/06/2021   CHOLHDL 2.9 03/06/2021   Lab Results  Component Value Date   VD25OH 23.4 (L) 03/06/2021   VD25OH 14.6 (L) 02/08/2020   VD25OH 20.2 (L) 07/13/2019   Lab Results  Component Value Date   WBC 10.5 03/06/2021   HGB 13.3 03/06/2021   HCT 41.4 03/06/2021   MCV 83 03/06/2021   PLT 396 03/06/2021   Attestation Statements:   Reviewed by clinician on day of visit: allergies, medications, problem list, medical history, surgical history, family history, social history, and previous encounter notes.  I, Water quality scientist, CMA, am acting as Location manager for Mina Marble, NP.  I have reviewed the above documentation for accuracy and completeness, and I agree with the above. -  Almadelia Looman d. Prentis Langdon, NP-C

## 2021-04-10 ENCOUNTER — Other Ambulatory Visit: Payer: Self-pay

## 2021-04-10 ENCOUNTER — Encounter: Payer: Self-pay | Admitting: Gastroenterology

## 2021-04-10 ENCOUNTER — Telehealth: Payer: Self-pay

## 2021-04-10 ENCOUNTER — Ambulatory Visit: Payer: 59 | Admitting: Gastroenterology

## 2021-04-10 VITALS — BP 118/82 | HR 80 | Ht 62.5 in | Wt 280.0 lb

## 2021-04-10 DIAGNOSIS — R194 Change in bowel habit: Secondary | ICD-10-CM | POA: Diagnosis not present

## 2021-04-10 DIAGNOSIS — K219 Gastro-esophageal reflux disease without esophagitis: Secondary | ICD-10-CM | POA: Diagnosis not present

## 2021-04-10 MED ORDER — DICYCLOMINE HCL 10 MG PO CAPS: 10.0000 mg | ORAL_CAPSULE | Freq: Three times a day (TID) | ORAL | 11 refills | Status: AC

## 2021-04-10 NOTE — Patient Instructions (Addendum)
It was a pleasure to meet you today.  ? ?I recommend an upper endoscopy and a colonoscopy for further evaluation. ? ?In the meantime, add a daily dose of Metamucil or Benefiber to try to even out your stool.   ? ?Dicyclomine 10 mg could be used four times daily as needed for abdominal cramping. ? ? ?

## 2021-04-10 NOTE — Telephone Encounter (Signed)
Called pt to schedule dbl @ [facility]. Pt has been scheduled on 07/02/21 @ 830am with Dr. Tarri Glenn, Dirk Dress, arrival time 7am. Amb referral placed for auth purposes. Prep instructions sent via My Chart per pt request. ?

## 2021-04-10 NOTE — Progress Notes (Signed)
Referring Provider: Julaine Fusi, NP Primary Care Physician:  Janeece Agee, NP   Reason for Consultation:  Diarrhea   IMPRESSION:  Change in bowel habits temporally associated with starting buproprion and significant psychosocial stressors GERD - longstanding symptoms now with abnormal sensation with passage of food bolus No prior colon cancer screening  PLAN: - Add a daily dose of Metamucil or Benefiber - Trial of Dicyclomine 10 mg QID PRN - EGD  - Colonoscopy   HPI: Mindy Collins is a 51 y.o. female referred by NP Danford for further evaluation of diarrhea.  The history is obtained through the patient and review of her electronic health record.  She has hypertension, sleep apnea and obesity with a BMI of 50. She has known fatty liver with a chronically elevated alk phos. 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  She developed diarrhea with intermittent abdominal cramping in January.  Symptoms temporally associated with starting bupropion SR 150 mg daily that was started due to increased work stress.  Diarrhea started the third day on bupropion.  Baseline habits are chronic constipation. Now it is followed by severe watery bowel movements. The constipation than returns. Stools can be pellets and associated with straining. She has significant burping. No malodor. Stools are preceded by cramping and relieved by defecation.   History of reflux previously controlled on Aciphex. Over the last year she has had the feeling that her food does not fully digests or sits longer than it should.   Significant stressors at work.   The patient reports a history of colitis in 2013 requiring hospitalization at [facility]. She understand her symptoms were triggered by stress.   Normal TSH 03/06/2021 Alk phos 149, other liver enzymes are normal 03/06/2021 Normal CBC  03/06/2021  No prior colonoscopy or colon cancer screening.  No prior abdominal imaging.  Multiple transvaginal ultrasounds. She is awaiting a hysterectomy for removal of her IUD after she loses weight.   She is concerned about cancer and would like to have her procedures performed as soon as possible.   Mother with IBS. There is no known family history of colon cancer or polyps. No family history of stomach cancer or other GI malignancy. No family history of inflammatory bowel disease or celiac.    Past Medical History:  Diagnosis Date   Abdominal wall seroma    Anemia    Arthritis    Asthma    B12 deficiency    Back pain    Carpal tunnel syndrome, bilateral    Chest pain    Colitis    Constipation    Dysmenorrhea    Dyspnea    Fatty liver    GERD (gastroesophageal reflux disease)    HLD (hyperlipidemia)    HPV (human papilloma virus) infection    WARTS/ TCA TX   Hypertension    Keratoconus    Leg edema    Obesity    Obesity    Osteoarthritis    Palpitations    Pre-diabetes    Prediabetes    Seasonal allergies    Sleep apnea     Past Surgical History:  Procedure Laterality Date   GYNECOLOGIC CRYOSURGERY  1993   DYSPLASIA CERVIX/    HYSTEROSCOPY N/A 08/18/2019   Procedure: HYSTEROSCOPY WITH UNSUCCESSFUL  REMOVAL OF IUD AND DILATION OF THE CERVIX UNDER ULTRASOUND GUIDANCE;  Surgeon: Seymour Bars,  Truddie Hidden, MD;  Location: [facility];  Service: Gynecology;  Laterality: N/A;  request 8:30am OR time in [place] held time for [facility] requests one hour   MYOMECTOMY  02/17/2011   Procedure: MYOMECTOMY;  Surgeon: Terrance Mass, MD;  Location: [facility];  Service: Gynecology;  Laterality: N/A;  I am hoping to get a 7:30am time on Dec 4, Dec 11 or Dec 12. It not available I will check on some 1:00 pm times. Thanks   MYOMECTOMY ABDOMINAL APPROACH  04/31/2007   OVARIAN CYST REMOVAL  02/17/2011   Procedure: OVARIAN CYSTECTOMY;  Surgeon: Terrance Mass, MD;  Location: [facility];   Service: Gynecology;  Laterality: Left;    Current Outpatient Medications  Medication Sig Dispense Refill   atorvastatin (LIPITOR) 40 MG tablet TAKE 1 TABLET(40 MG) BY MOUTH DAILY 90 tablet 3   cyclobenzaprine (FLEXERIL) 5 MG tablet Take 5 mg by mouth 3 (three) times daily as needed.     diclofenac (VOLTAREN) 75 MG EC tablet Take 75 mg by mouth 2 (two) times daily.     fluticasone (FLONASE) 50 MCG/ACT nasal spray Place 1 spray into both nostrils 2 (two) times daily as needed (allergies.).   0   levonorgestrel (MIRENA) 20 MCG/24HR IUD 1 each by Intrauterine route once.     loratadine (CLARITIN) 10 MG tablet Take 10 mg by mouth daily as needed for allergies.      metFORMIN (GLUCOPHAGE) 500 MG tablet Take 1 tablet (500 mg total) by mouth 2 (two) times daily with a meal. 90 tablet 0   Multiple Vitamin (MULTIVITAMIN PO) Take by mouth.     olmesartan (BENICAR) 20 MG tablet Take 20 mg by mouth daily.     vitamin B-12 (CYANOCOBALAMIN) 1000 MCG tablet Take 1,000 mcg by mouth daily.     VITAMIN D PO Take by mouth.     Vitamin D, Ergocalciferol, (DRISDOL) 1.25 MG (50000 UNIT) CAPS capsule Take 1 capsule (50,000 Units total) by mouth every 7 (seven) days. 4 capsule 0   No current facility-administered medications for this visit.    Allergies as of 04/10/2021 - Review Complete 04/10/2021  Allergen Reaction Noted   Esomeprazole magnesium Shortness Of Breath 01/04/2011   Adhesive [tape] Itching and Rash 09/06/2012   Latex Itching and Dermatitis 08/16/2010   Levocetirizine dihydrochloride Other (See Comments) 07/03/2016    Family History  Problem Relation Age of Onset   Hypertension Mother    Breast cancer Mother    Cancer Mother    Hyperlipidemia Mother    Thyroid disease Mother    Obesity Mother    Cancer Father        LUNG   Breast cancer Maternal Aunt    Cancer Paternal Aunt    Ovarian cancer Paternal Aunt    Heart disease Maternal Grandmother    Diabetes Maternal Grandfather     Pancreatic cancer Neg Hx    Stomach cancer Neg Hx    Liver cancer Neg Hx    Colon cancer Neg Hx     Social History   Socioeconomic History   Marital status: Single    Spouse name: Not on file   Number of children: 0   Years of education: Not on file   Highest education level: Not on file  Occupational History   Occupation: Sport and exercise psychologist, Environmental consultant college, admin    Employer: [employer]    Comment: [employer]  Tobacco Use   Smoking status: Never   Smokeless tobacco: Never   Tobacco  comments:    denies tobacco   Vaping Use   Vaping Use: Never used  Substance and Sexual Activity   Alcohol use: No    Alcohol/week: 0.0 standard drinks   Drug use: No   Sexual activity: Not Currently    Birth control/protection: I.U.D.    Comment: mirena inserted 2016  Other Topics Concern   Not on file  Social History Narrative   Graduate degree from [employer] in woman/gender studies.   Lives with her mother.   Social Determinants of Health   Financial Resource Strain: Not on file  Food Insecurity: Not on file  Transportation Needs: Not on file  Physical Activity: Not on file  Stress: Not on file  Social Connections: Not on file  Intimate Partner Violence: Not on file    Review of Systems: 12 system ROS is negative except as noted above with the addition of fatigue and insomnia.   Physical Exam: General:   Alert,  well-nourished, pleasant and cooperative in NAD Head:  Normocephalic and atraumatic. Eyes:  Sclera clear, no icterus.   Conjunctiva pink. Ears:  Normal auditory acuity. Nose:  No deformity, discharge,  or lesions. Mouth:  No deformity or lesions.   Neck:  Supple; no masses or thyromegaly. Lungs:  Clear throughout to auscultation.   No wheezes. Heart:  Regular rate and rhythm; no murmurs. Abdomen:  Soft, nontender, nondistended, normal bowel sounds, no rebound or guarding. No hepatosplenomegaly.   Rectal:  Deferred  Msk:  Symmetrical. No boney deformities LAD: No  inguinal or umbilical LAD Extremities:  No clubbing or edema. Neurologic:  Alert and  oriented x4;  grossly nonfocal Skin:  Intact without significant lesions or rashes. Psych:  Alert and cooperative. Normal mood and affect.     Augustin Bun L. Tarri Glenn, MD, MPH 04/10/2021, 10:54 AM

## 2021-04-22 ENCOUNTER — Other Ambulatory Visit: Payer: Self-pay

## 2021-04-22 ENCOUNTER — Ambulatory Visit (INDEPENDENT_AMBULATORY_CARE_PROVIDER_SITE_OTHER): Payer: 59 | Admitting: Family Medicine

## 2021-04-22 ENCOUNTER — Encounter (INDEPENDENT_AMBULATORY_CARE_PROVIDER_SITE_OTHER): Payer: Self-pay | Admitting: Family Medicine

## 2021-04-22 VITALS — BP 119/81 | HR 85 | Temp 98.4°F | Ht 62.0 in | Wt 276.0 lb

## 2021-04-22 DIAGNOSIS — E559 Vitamin D deficiency, unspecified: Secondary | ICD-10-CM

## 2021-04-22 DIAGNOSIS — Z6841 Body Mass Index (BMI) 40.0 and over, adult: Secondary | ICD-10-CM | POA: Diagnosis not present

## 2021-04-22 DIAGNOSIS — Z9189 Other specified personal risk factors, not elsewhere classified: Secondary | ICD-10-CM

## 2021-04-22 DIAGNOSIS — E669 Obesity, unspecified: Secondary | ICD-10-CM | POA: Diagnosis not present

## 2021-04-22 MED ORDER — VITAMIN D (ERGOCALCIFEROL) 1.25 MG (50000 UNIT) PO CAPS: 50000.0000 [IU] | ORAL_CAPSULE | ORAL | 0 refills | Status: DC

## 2021-04-23 NOTE — Progress Notes (Signed)
? ? ? ?Chief Complaint:  ? ?OBESITY ?Mindy Collins is here to discuss her progress with her obesity treatment plan along with follow-up of her obesity related diagnoses. Mindy Collins is on keeping a food journal and adhering to recommended goals of 1300-1500 calories and 85 grams of protein daily and states she is following her eating plan approximately 20% of the time. Mindy Collins states she is doing  0 minutes 0 times per week. ? ?Today's visit was #: 4 ?Starting weight: 278 lbs ?Starting date: 03/06/2021 ?Today's weight: 276 lbs ?Today's date: 04/22/2021 ?Total lbs lost to date: 2 ?Total lbs lost since last in-office visit: 0 ? ?Interim History: Mindy Collins has done well with maintaining her weight. She has been missing meals and eating later, especially due to her hectic work schedule. She feels she will be abl to meal plan better this week. She will be traveling soon. ? ?Subjective:  ? ?1. Vitamin D deficiency ?Mindy Collins is on Vitamin D, but her level is not yet at goal. She requests a refill today. ? ?2. At high risk for fluid overload ?Mindy Collins is at a higher than average risk for fluid retention due to obesity. Reviewed: no chest pain on exertion, no dyspnea at rest, and no swelling of ankles. ? ?Assessment/Plan:  ? ?1. Vitamin D deficiency ?We will refill prescription Vitamin D for 1 month. Mindy Collins will follow-up for routine testing of Vitamin D, at least 2-3 times per year to avoid over-replacement. ? ?- Vitamin D, Ergocalciferol, (DRISDOL) 1.25 MG (50000 UNIT) CAPS capsule; Take 1 capsule (50,000 Units total) by mouth every 7 (seven) days.  Dispense: 4 capsule; Refill: 0 ? ?2. At high risk for fluid overload ?Mindy Collins was given approximately 15 minutes of fluid retention prevention counseling today. She is 51 y.o. female and has risk factors for fluid retention including obesity. We discussed intensive lifestyle modifications today with an emphasis on specific weight loss instructions, proper nutrition and exercise strategies.  ? ?Repetitive  spaced learning was employed today to elicit superior memory formation and behavioral change.  ? ?3. Obesity with current BMI of 50.6 ?Mindy Collins is currently in the action stage of change. As such, her goal is to continue with weight loss efforts. She has agreed to change to the Category 3 Plan.  ? ?Behavioral modification strategies: increasing lean protein intake, increasing water intake, decreasing sodium intake, and meal planning and cooking strategies. ? ?Mindy Collins has agreed to follow-up with our clinic in 2 weeks. She was informed of the importance of frequent follow-up visits to maximize her success with intensive lifestyle modifications for her multiple health conditions.  ? ?Objective:  ? ?Blood pressure 119/81, pulse 85, temperature 98.4 ?F (36.9 ?C), height '5\' 2"'$  (1.575 m), weight 276 lb (125.2 kg), SpO2 99 %. ?Body mass index is 50.48 kg/m?. ? ?General: Cooperative, alert, well developed, in no acute distress. ?HEENT: Conjunctivae and lids unremarkable. ?Cardiovascular: Regular rhythm.  ?Lungs: Normal work of breathing. ?Neurologic: No focal deficits.  ? ?Lab Results  ?Component Value Date  ? CREATININE 0.76 03/06/2021  ? BUN 10 03/06/2021  ? NA 146 (H) 03/06/2021  ? K 4.4 03/06/2021  ? CL 105 03/06/2021  ? CO2 28 03/06/2021  ? ?Lab Results  ?Component Value Date  ? ALT 24 03/06/2021  ? AST 26 03/06/2021  ? ALKPHOS 149 (H) 03/06/2021  ? BILITOT 0.8 03/06/2021  ? ?Lab Results  ?Component Value Date  ? HGBA1C 6.1 (H) 03/06/2021  ? HGBA1C 6.2 (H) 02/08/2020  ? HGBA1C 6.0 (H) 08/12/2019  ?  HGBA1C 5.8 (H) 02/23/2019  ? HGBA1C 5.9 (H) 09/01/2018  ? ?Lab Results  ?Component Value Date  ? INSULIN 19.5 03/06/2021  ? INSULIN 3.4 02/23/2019  ? INSULIN 19.3 09/01/2018  ? INSULIN 32.3 (H) 12/21/2017  ? INSULIN 14.8 09/07/2017  ? ?Lab Results  ?Component Value Date  ? TSH 2.390 03/06/2021  ? ?Lab Results  ?Component Value Date  ? CHOL 148 03/06/2021  ? HDL 51 03/06/2021  ? McNeil 81 03/06/2021  ? LDLDIRECT 141.6 04/16/2010   ? TRIG 83 03/06/2021  ? CHOLHDL 2.9 03/06/2021  ? ?Lab Results  ?Component Value Date  ? VD25OH 23.4 (L) 03/06/2021  ? VD25OH 14.6 (L) 02/08/2020  ? VD25OH 20.2 (L) 07/13/2019  ? ?Lab Results  ?Component Value Date  ? WBC 10.5 03/06/2021  ? HGB 13.3 03/06/2021  ? HCT 41.4 03/06/2021  ? MCV 83 03/06/2021  ? PLT 396 03/06/2021  ? ?No results found for: IRON, TIBC, FERRITIN ? ?Attestation Statements:  ? ?Reviewed by clinician on day of visit: allergies, medications, problem list, medical history, surgical history, family history, social history, and previous encounter notes. ? ? ?I, Trixie Dredge, am acting as transcriptionist for Dennard Nip, MD. ? ?I have reviewed the above documentation for accuracy and completeness, and I agree with the above. -  Dennard Nip, MD ? ? ?

## 2021-05-07 ENCOUNTER — Ambulatory Visit (INDEPENDENT_AMBULATORY_CARE_PROVIDER_SITE_OTHER): Payer: 59 | Admitting: Adult Health

## 2021-05-21 ENCOUNTER — Ambulatory Visit (INDEPENDENT_AMBULATORY_CARE_PROVIDER_SITE_OTHER): Payer: 59 | Admitting: Family Medicine

## 2021-05-24 ENCOUNTER — Encounter (HOSPITAL_COMMUNITY): Payer: Self-pay | Admitting: Pharmacy Technician

## 2021-05-24 ENCOUNTER — Emergency Department (HOSPITAL_COMMUNITY): Payer: 59

## 2021-05-24 ENCOUNTER — Other Ambulatory Visit: Payer: Self-pay

## 2021-05-24 ENCOUNTER — Emergency Department (HOSPITAL_COMMUNITY)
Admission: EM | Admit: 2021-05-24 | Discharge: 2021-05-24 | Disposition: A | Discharge status: Home / Self Care | Payer: 59 | Attending: Emergency Medicine | Admitting: Emergency Medicine

## 2021-05-24 DIAGNOSIS — D72829 Elevated white blood cell count, unspecified: Secondary | ICD-10-CM | POA: Insufficient documentation

## 2021-05-24 DIAGNOSIS — R079 Chest pain, unspecified: Secondary | ICD-10-CM

## 2021-05-24 DIAGNOSIS — R0789 Other chest pain: Secondary | ICD-10-CM | POA: Insufficient documentation

## 2021-05-24 DIAGNOSIS — I10 Essential (primary) hypertension: Secondary | ICD-10-CM | POA: Insufficient documentation

## 2021-05-24 DIAGNOSIS — R200 Anesthesia of skin: Secondary | ICD-10-CM | POA: Insufficient documentation

## 2021-05-24 DIAGNOSIS — Z79899 Other long term (current) drug therapy: Secondary | ICD-10-CM | POA: Diagnosis not present

## 2021-05-24 DIAGNOSIS — Z91040 Latex allergy status: Secondary | ICD-10-CM | POA: Diagnosis not present

## 2021-05-24 LAB — BASIC METABOLIC PANEL
Anion gap: 8 (ref 5–15)
BUN: 9 mg/dL (ref 6–20)
CO2: 29 mmol/L (ref 22–32)
Calcium: 9.3 mg/dL (ref 8.9–10.3)
Chloride: 104 mmol/L (ref 98–111)
Creatinine, Ser: 0.8 mg/dL (ref 0.44–1.00)
GFR, Estimated: >60 mL/min (ref >60)
Glucose, Bld: 98 mg/dL (ref 70–99)
Potassium: 3.9 mmol/L (ref 3.5–5.1)
Sodium: 141 mmol/L (ref 135–145)

## 2021-05-24 LAB — TROPONIN I (HIGH SENSITIVITY)
Troponin I (High Sensitivity): 4 ng/L (ref <18)
Troponin I (High Sensitivity): 5 ng/L (ref <18)

## 2021-05-24 LAB — CBC
HCT: 41.6 % (ref 36.0–46.0)
Hemoglobin: 13.1 g/dL (ref 12.0–15.0)
MCH: 26.6 pg (ref 26.0–34.0)
MCHC: 31.5 g/dL (ref 30.0–36.0)
MCV: 84.6 fL (ref 80.0–100.0)
Platelets: 410 10*3/uL — ABNORMAL HIGH (ref 150–400)
RBC: 4.92 MIL/uL (ref 3.87–5.11)
RDW: 14.6 % (ref 11.5–15.5)
WBC: 13.1 10*3/uL — ABNORMAL HIGH (ref 4.0–10.5)
nRBC: 0 % (ref 0.0–0.2)

## 2021-05-24 LAB — BRAIN NATRIURETIC PEPTIDE: B Natriuretic Peptide: 14.8 pg/mL (ref 0.0–100.0)

## 2021-05-24 NOTE — ED Provider Notes (Addendum)
?[facility] ?Provider Note ? ? ?CSN: [id number] ?Arrival date & time: 05/24/21  1725 ? ?  ? ?History ? ?Chief Complaint  ?Patient presents with  ? Chest Pain  ? ? ?Mindy Collins is a 51 y.o. female. ? ? ?Chest Pain ? ?Patient is a 51 year old female with history of hypertension, hyperlipidemia, prediabetic, obesity who presented to the emergency department for left-sided chest pain associated with numbness on her left arm and back of the shoulder.  Patient describes the pain started 3 days ago. However, today at around 1700 the chest pain got sharp which concerned her this brought to the emergency department.  She denies a source of the short of breath.  She denies associated fever, cough, congestion, or sick contact.  Denies abdominal pain.  Denies headache or vision change.  She reports being compliant with her medication.  Denies any leg swelling or long distance travel.  Denies tobacco use or alcohol use.  Otherwise no other complaints. ? ?Home Medications ?Prior to Admission medications   ?Medication Sig Start Date End Date Taking? Authorizing Provider  ?atorvastatin (LIPITOR) 40 MG tablet TAKE 1 TABLET(40 MG) BY MOUTH DAILY ?Patient taking differently: Take 40 mg by mouth daily. 10/17/20  Yes Maximiano Coss, NP  ?Carboxymethylcellulose Sodium (REFRESH TEARS OP) Place 2 drops into both eyes daily as needed (dry eyes).   Yes [provider]  ?cyclobenzaprine (FLEXERIL) 5 MG tablet Take 5 mg by mouth 3 (three) times daily as needed for muscle spasms. 11/24/20  Yes [provider]  ?dicyclomine (BENTYL) 10 MG capsule Take 1 capsule (10 mg total) by mouth 3 (three) times daily before meals. ?Patient taking differently: Take 10 mg by mouth 3 (three) times daily as needed for spasms. 04/10/21  Yes Thornton Park, MD  ?fluticasone (FLONASE) 50 MCG/ACT nasal spray Place 1 spray into both nostrils 2 (two) times daily as needed for allergies (allergies.). 01/17/16  Yes  [provider]  ?Ketotifen Fumarate (ALAWAY OP) Place 2 drops into both eyes daily as needed (dry, itchy eyes).   Yes [provider]  ?levonorgestrel (MIRENA) 20 MCG/24HR IUD 1 each by Intrauterine route once.   Yes [provider]  ?loratadine (CLARITIN) 10 MG tablet Take 10 mg by mouth daily as needed for allergies.    Yes [provider]  ?metFORMIN (GLUCOPHAGE) 500 MG tablet Take 1 tablet (500 mg total) by mouth 2 (two) times daily with a meal. 01/15/21  Yes Maximiano Coss, NP  ?Multiple Vitamin (MULTIVITAMIN PO) Take 1 tablet by mouth daily.   Yes [provider]  ?olmesartan (BENICAR) 20 MG tablet Take 20 mg by mouth daily. 07/19/20  Yes [provider]  ?vitamin B-12 (CYANOCOBALAMIN) 1000 MCG tablet Take 1,000 mcg by mouth daily.   Yes [provider]  ?Vitamin D, Ergocalciferol, (DRISDOL) 1.25 MG (50000 UNIT) CAPS capsule Take 1 capsule (50,000 Units total) by mouth every 7 (seven) days. ?Patient taking differently: Take 50,000 Units by mouth every Sunday. 04/22/21  Yes Dennard Nip D, MD  ?   ? ?Allergies    ?Esomeprazole magnesium, Adhesive [tape], Latex, and Levocetirizine dihydrochloride   ? ?Review of Systems   ?Review of Systems  ?Cardiovascular:  Positive for chest pain.  ? ?Physical Exam ?Updated Vital Signs ?BP (!) 120/53   Pulse 85   Temp 98.5 ?F (36.9 ?C) (Oral)   Resp 20   SpO2 100%  ?Physical Exam ?Constitutional:   ?   General: She is not  in acute distress. ?   Appearance: She is not ill-appearing, toxic-appearing or diaphoretic.  ?HENT:  ?   Head: Normocephalic.  ?Eyes:  ?   Pupils: Pupils are equal, round, and reactive to light.  ?Cardiovascular:  ?   Rate and Rhythm: Normal rate.  ?   Heart sounds: Normal heart sounds.  ?Pulmonary:  ?   Effort: Pulmonary effort is normal. No tachypnea or respiratory distress.  ?   Breath sounds: No stridor. No decreased breath sounds or wheezing.  ?Chest:  ?   Chest wall: No mass or  tenderness.  ?Abdominal:  ?   General: There is no abdominal bruit.  ?   Palpations: Abdomen is soft.  ?   Tenderness: There is no guarding or rebound.  ?Musculoskeletal:     ?   General: Normal range of motion.  ?Skin: ?   General: Skin is warm.  ?   Capillary Refill: Capillary refill takes less than 2 seconds.  ?Neurological:  ?   General: No focal deficit present.  ?   Mental Status: She is alert.  ?   Cranial Nerves: No cranial nerve deficit.  ? ? ?ED Results / Procedures / Treatments   ?Labs ?(all labs ordered are listed, but only abnormal results are displayed) ?Labs Reviewed  ?CBC - Abnormal; Notable for the following components:  ?    Result Value  ? WBC 13.1 (*)   ? Platelets 410 (*)   ? All other components within normal limits  ?BASIC METABOLIC PANEL  ?BRAIN NATRIURETIC PEPTIDE  ?I-STAT BETA HCG BLOOD, ED ([facility], [facility], [facility] ONLY)  ?TROPONIN I (HIGH SENSITIVITY)  ?TROPONIN I (HIGH SENSITIVITY)  ? ? ?EKG ?EKG Interpretation ? ?Date/Time:  Friday May 24 2021 18:17:17 EDT ?Ventricular Rate:  88 ?PR Interval:  148 ?QRS Duration: 92 ?QT Interval:  386 ?QTC Calculation: 467 ?R Axis:   5 ?Text Interpretation: Normal sinus rhythm Anterior infarct , age undetermined Abnormal ECG When compared with ECG of 12-Aug-2019 08:57, No significant change since last tracing Confirmed by Davonna Belling [phone]) on 05/24/2021 9:13:25 PM ? ?Radiology ?DG Chest 2 View ? ?Result Date: 05/24/2021 ?CLINICAL DATA:  Chest pain EXAM: CHEST - 2 VIEW COMPARISON:  Two-view chest x-ray 07/21/2016 FINDINGS: The heart size and mediastinal contours are within normal limits. Both lungs are clear. The visualized skeletal structures are unremarkable. IMPRESSION: No active cardiopulmonary disease. Electronically Signed   By: San Morelle M.D.   On: 05/24/2021 19:30   ? ?Procedures ?Procedures  ? ?Medications Ordered in ED ?Medications - No data to display ? ?ED Course/ Medical Decision Making/ A&P ?  ?HEAR Score: 4                        ?Medical Decision Making ?Problems Addressed: ?Atypical chest pain: acute illness or injury that poses a threat to life or bodily functions ?Chest pain, unspecified type: acute illness or injury that poses a threat to life or bodily functions ? ?Amount and/or Complexity of Data Reviewed ?Labs: ordered. Decision-making details documented in ED Course. ?Radiology: ordered and independent interpretation performed. Decision-making details documented in ED Course. ?ECG/medicine tests: ordered and independent interpretation performed. Decision-making details documented in ED Course. ? ? ?Patient is a 51 year old female presents to the emergency department for left-sided chest pain for the past 3 days that is progressively worsening.  Patient vital signs are within the reference range.  Physical exam without sign of respiratory distress.  Patient is well-appearing  and oxygenating well without oxygen support. ? ?Patient presentation unclear at this time.  Differential include ACS etiology, pneumonia, musculoskeletal etiology.  Cannot rule out GI etiology as patient does have a history of GERD.  At this point less concern for PE.  Patient is not hypoxic, has no shortness of breath and she is hemodynamically stable. ? ?Patient did have basic labs including delta troponin, EKG and chest x-ray done at [facility]. ? ?Patient CBC with very mild leukocytosis at 13.1.  Hemoglobin hematocrit are stable.  BMP without severe metabolic derangement.  BNP unremarkable at 14.8.  Delta troponin negative and without troponinemia.  Patient EKG shows normal sinus rhythm.  As compared to her previous EKG no acute changes.  No ischemic changes.  No Brugada or Wellens.  Her chest x-ray did not show any acute cardiopulmonary etiology. ? ?I believe patient presentation is most likely benign.  Her heart score is 4 and I have discussed with her follow-up with her primary care and have given her cardiology information to follow-up.  Strict return  precaution discussed.  Recommend follow-up with PCP in 2 to 3 days. ? ?Final Clinical Impression(s) / ED Diagnoses ?Final diagnoses:  ?Chest pain, unspecified type  ?Atypical chest pain  ? ? ?Rx / DC Orders ?ED Discharge

## 2021-05-24 NOTE — ED Triage Notes (Signed)
Pt here with 3 days of L sided chest pain, with worsening today. States today the pain radiated to her back and down her L arm causing some numbness to her L arm. Pt denies numbness currently.  ?

## 2021-05-24 NOTE — Discharge Instructions (Addendum)
Your work up in the EM is grossly negative.  ? ?Please follow-up with your primary care in 2 to 3 days.  I have given you phone number to reach out to cardiology.  ? ?Come back to the emergency department if you have worsening symptoms or if you feel like you need to be reevaluated. ?

## 2021-05-24 NOTE — ED Notes (Signed)
RN reviewed discharge instructions with pt. Pt verbalized understanding and had no further questions. VSS upon discharge.  

## 2021-05-24 NOTE — ED Provider Triage Note (Signed)
Emergency Medicine Provider Triage Evaluation Note ? ?Mindy Collins , a 51 y.o. female  was evaluated in triage.  Pt complains of intermittent left-sided chest pain for 3 days.  Patient states that the pain often radiates to her left shoulder blade, as well as her left arm.  While she was driving here she felt her left arm go numb.  She feels as though the numbness has stopped.  She also has been noticing bilateral leg swelling to the level of her knees. ? ?Review of Systems  ?Positive: Chest pain, leg swelling, dyspnea on exertion ?Negative: Fever, chills, cough, shortness of breath, diaphoresis ? ?Physical Exam  ?BP (!) 143/85   Pulse 87   Temp 98.9 ?F (37.2 ?C) (Oral)   Resp 16   SpO2 100%  ?Gen:   Awake, no distress   ?Resp:  Normal effort  ?MSK:   Moves extremities without difficulty  ?Other:   ? ?Medical Decision Making  ?Medically screening exam initiated at 6:41 PM.  Appropriate orders placed.  Mindy Collins was informed that the remainder of the evaluation will be completed by another provider, this initial triage assessment does not replace that evaluation, and the importance of remaining in the ED until their evaluation is complete. ? ? ?  ?Mindy Plummer, PA-C ?05/24/21 1846 ? ?

## 2021-06-25 ENCOUNTER — Encounter (HOSPITAL_COMMUNITY): Payer: Self-pay | Admitting: Gastroenterology

## 2021-06-25 ENCOUNTER — Ambulatory Visit: Payer: 59 | Admitting: Registered Nurse

## 2021-06-25 ENCOUNTER — Encounter: Payer: Self-pay | Admitting: Registered Nurse

## 2021-06-25 VITALS — BP 118/78 | HR 78 | Temp 98.1°F | Resp 16 | Ht 62.0 in | Wt 279.0 lb

## 2021-06-25 DIAGNOSIS — M542 Cervicalgia: Secondary | ICD-10-CM | POA: Diagnosis not present

## 2021-06-25 MED ORDER — DICLOFENAC SODIUM 75 MG PO TBEC: 75.0000 mg | DELAYED_RELEASE_TABLET | Freq: Two times a day (BID) | ORAL | 0 refills | Status: DC

## 2021-06-25 MED ORDER — METHYLPREDNISOLONE ACETATE 80 MG/ML IJ SUSP
80.0000 mg | Freq: Once | INTRAMUSCULAR | Status: AC
  Administered 2021-06-25: 80 mg via INTRAMUSCULAR
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
## 2021-06-25 NOTE — Patient Instructions (Signed)
Ms. Gangi -  ? ?Great to see you! ? ?Keep doing your neck exercises and stretching. ?Remember to get good support to keep your neck in a neutral position when you sleep ? ?Ok to take flexeril 5-'10mg'$  three times daily as needed. Continue diclofenac ? ?Thank you ? ?Rich  ?

## 2021-06-25 NOTE — Progress Notes (Signed)
Attempted to obtain medical history via telephone, unable to reach at this time. HIPAA compliant voicemail message left requesting return call to pre surgical testing department. 

## 2021-06-27 ENCOUNTER — Telehealth: Payer: Self-pay | Admitting: Gastroenterology

## 2021-06-27 NOTE — Telephone Encounter (Signed)
Patient called to cancel her hospital endo/colon scheduled for 07/02/21 and asked that you call to get this rescheduled.  Thank you.

## 2021-06-28 ENCOUNTER — Ambulatory Visit
Admission: RE | Admit: 2021-06-28 | Discharge: 2021-06-28 | Disposition: A | Discharge status: Home / Self Care | Payer: 59 | Source: Ambulatory Visit | Attending: Registered Nurse | Admitting: Registered Nurse

## 2021-06-28 DIAGNOSIS — M542 Cervicalgia: Secondary | ICD-10-CM

## 2021-07-01 ENCOUNTER — Telehealth: Payer: Self-pay

## 2021-07-01 ENCOUNTER — Other Ambulatory Visit (INDEPENDENT_AMBULATORY_CARE_PROVIDER_SITE_OTHER): Payer: Self-pay | Admitting: Family Medicine

## 2021-07-01 ENCOUNTER — Other Ambulatory Visit: Payer: Self-pay

## 2021-07-01 MED ORDER — OLMESARTAN MEDOXOMIL 20 MG PO TABS: 20.0000 mg | ORAL_TABLET | Freq: Every day | ORAL | 0 refills | Status: DC

## 2021-07-01 NOTE — Telephone Encounter (Signed)
Patient's medication has been sent to her preferred pharmacy.

## 2021-07-01 NOTE — Telephone Encounter (Signed)
MEDICATION: olmesartan (BENICAR) 20 MG tablet  PHARMACY: [id number] - [place], [address] AT [address]  Comments: Patient is completely out    **Let patient know to contact pharmacy at the end of the day to make sure medication is ready. **  ** Please notify patient to allow 48-72 hours to process**  **Encourage patient to contact the pharmacy for refills or they can request refills through [facility]**

## 2021-07-02 ENCOUNTER — Encounter (HOSPITAL_COMMUNITY): Admission: RE | Payer: Self-pay | Source: Home / Self Care

## 2021-07-02 ENCOUNTER — Ambulatory Visit (HOSPITAL_COMMUNITY): Admission: RE | Admit: 2021-07-02 | Payer: 59 | Source: Home / Self Care | Admitting: Gastroenterology

## 2021-07-02 SURGERY — COLONOSCOPY WITH PROPOFOL
Anesthesia: Monitor Anesthesia Care

## 2021-07-04 ENCOUNTER — Ambulatory Visit (INDEPENDENT_AMBULATORY_CARE_PROVIDER_SITE_OTHER): Payer: 59 | Admitting: Registered Nurse

## 2021-07-04 ENCOUNTER — Other Ambulatory Visit: Payer: Self-pay

## 2021-07-04 ENCOUNTER — Encounter: Payer: Self-pay | Admitting: Registered Nurse

## 2021-07-04 ENCOUNTER — Encounter: Payer: 59 | Admitting: Registered Nurse

## 2021-07-04 VITALS — BP 121/56 | HR 76 | Temp 98.0°F | Resp 18 | Ht 62.5 in | Wt 275.8 lb

## 2021-07-04 DIAGNOSIS — Z1329 Encounter for screening for other suspected endocrine disorder: Secondary | ICD-10-CM

## 2021-07-04 DIAGNOSIS — Z13228 Encounter for screening for other metabolic disorders: Secondary | ICD-10-CM | POA: Diagnosis not present

## 2021-07-04 DIAGNOSIS — Z13220 Encounter for screening for lipoid disorders: Secondary | ICD-10-CM | POA: Diagnosis not present

## 2021-07-04 DIAGNOSIS — M542 Cervicalgia: Secondary | ICD-10-CM | POA: Diagnosis not present

## 2021-07-04 DIAGNOSIS — Z130 Encounter for screening for diseases of the blood and blood-forming organs and certain disorders involving the immune mechanism: Secondary | ICD-10-CM

## 2021-07-04 DIAGNOSIS — Z0000 Encounter for general adult medical examination without abnormal findings: Secondary | ICD-10-CM

## 2021-07-04 LAB — COMPREHENSIVE METABOLIC PANEL
ALT: 34 U/L (ref 0–35)
AST: 21 U/L (ref 0–37)
Albumin: 4.4 g/dL (ref 3.5–5.2)
Alkaline Phosphatase: 139 U/L — ABNORMAL HIGH (ref 39–117)
BUN: 12 mg/dL (ref 6–23)
CO2: 31 mEq/L (ref 19–32)
Calcium: 9.3 mg/dL (ref 8.4–10.5)
Chloride: 103 mEq/L (ref 96–112)
Creatinine, Ser: 0.77 mg/dL (ref 0.40–1.20)
GFR: 90.93 mL/min (ref >60.00)
Glucose, Bld: 72 mg/dL (ref 70–99)
Potassium: 4 mEq/L (ref 3.5–5.1)
Sodium: 143 mEq/L (ref 135–145)
Total Bilirubin: 0.8 mg/dL (ref 0.2–1.2)
Total Protein: 7.2 g/dL (ref 6.0–8.3)

## 2021-07-04 LAB — TSH: TSH: 2.77 u[IU]/mL (ref 0.35–5.50)

## 2021-07-04 LAB — CBC WITH DIFFERENTIAL/PLATELET
Basophils Absolute: 0 10*3/uL (ref 0.0–0.1)
Basophils Relative: 0.3 % (ref 0.0–3.0)
Eosinophils Absolute: 0.1 10*3/uL (ref 0.0–0.7)
Eosinophils Relative: 0.9 % (ref 0.0–5.0)
HCT: 40.6 % (ref 36.0–46.0)
Hemoglobin: 12.9 g/dL (ref 12.0–15.0)
Lymphocytes Relative: 18.3 % (ref 12.0–46.0)
Lymphs Abs: 2.4 10*3/uL (ref 0.7–4.0)
MCHC: 31.8 g/dL (ref 30.0–36.0)
MCV: 82.9 fl (ref 78.0–100.0)
Monocytes Absolute: 0.9 10*3/uL (ref 0.1–1.0)
Monocytes Relative: 7.1 % (ref 3.0–12.0)
Neutro Abs: 9.6 10*3/uL — ABNORMAL HIGH (ref 1.4–7.7)
Neutrophils Relative %: 73.4 % (ref 43.0–77.0)
Platelets: 363 10*3/uL (ref 150.0–400.0)
RBC: 4.9 Mil/uL (ref 3.87–5.11)
RDW: 15 % (ref 11.5–15.5)
WBC: 13.1 10*3/uL — ABNORMAL HIGH (ref 4.0–10.5)

## 2021-07-04 LAB — LIPID PANEL
Cholesterol: 166 mg/dL (ref 0–200)
HDL: 56.6 mg/dL (ref >39.00)
LDL Cholesterol: 96 mg/dL (ref 0–99)
NonHDL: 109.35
Total CHOL/HDL Ratio: 3
Triglycerides: 66 mg/dL (ref 0.0–149.0)
VLDL: 13.2 mg/dL (ref 0.0–40.0)

## 2021-07-04 LAB — HEMOGLOBIN A1C: Hgb A1c MFr Bld: 6.1 % (ref 4.6–6.5)

## 2021-07-04 NOTE — Patient Instructions (Addendum)
Ms. Mindy [facility] to see you!  Call with concerns  I'll let you know how labs look  See you in a year if they're normal  Thanks,  Rich     If you have lab work done today you will be contacted with your lab results within the next 2 weeks.  If you have not heard from [place] then please contact us. The fastest way to get your results is to register for My Chart.   IF you received an x-ray today, you will receive an invoice from [facility]. Please contact [facility] at [phone] with questions or concerns regarding your invoice.   IF you received labwork today, you will receive an invoice from [place]. Please contact [facility] at [phone] with questions or concerns regarding your invoice.   Our billing staff will not be able to assist you with questions regarding bills from these companies.  You will be contacted with the lab results as soon as they are available. The fastest way to get your results is to activate your My Chart account. Instructions are located on the last page of this paperwork. If you have not heard from [place] regarding the results in 2 weeks, please contact this office.

## 2021-07-04 NOTE — Progress Notes (Signed)
Complete physical exam  Patient: [facility]y [facility]   DOB: Aug 14, 1970   51 y.o. Female  MRN: [id number] Visit Date: 07/04/2021  Subjective:    Chief Complaint  Patient presents with   Annual Exam    Patient states she is here for CPE    Mindy Collins is a 51 y.o. female who presents today for a complete physical exam. She reports consuming a general diet.     She generally feels well. She reports sleeping well. She does not have additional problems to discuss today.   Vision:Within the last year Dental:Within Last 6 months STD Screen:No PSA:No  Neck pain Improved but not resolved Xray showed progressive degenerative changes Pt would like to pursue PT and Neurosurg consults Will place referrals.  OSA on CPAP Mask has poor fit Plans to follow up with her specialist for this Discussed that we can place referral if needed - no further visit required.   Most recent fall risk assessment:    07/04/2021   10:54 AM  Fall Risk   Falls in the past year? 0  Number falls in past yr: 0  Injury with Fall? 0  Risk for fall due to : No Fall Risks  Follow up Falls evaluation completed     Most recent depression screenings:    07/04/2021   10:54 AM 06/25/2021    2:32 PM  PHQ 2/9 Scores  PHQ - 2 Score 0 0  PHQ- 9 Score 0      Patient Active Problem List   Diagnosis Date Noted   Class 3 severe obesity with serious comorbidity and body Collins index (BMI) of 50.0 to 59.9 in adult ([facility]) 06/22/2018   Other fatigue 09/07/2017   Shortness of breath on exertion 09/07/2017   Moderate mixed hyperlipidemia not requiring statin therapy 09/07/2017   Vitamin D deficiency 09/07/2017   Transaminitis 03/08/2017   Vitamin B12 deficiency 03/08/2017   Primary insomnia 03/08/2017   Prediabetes 10/03/2016   Hyperlipidemia LDL goal <130 10/03/2016   Abdominal wall seroma 03/14/2016   Adnexal Collins 03/03/2016   Thrombocytosis 07/31/2014   IUD (intrauterine device) in place 07/31/2014   DUB (dysfunctional  uterine bleeding) 07/27/2014   Intramural leiomyoma of uterus 06/30/2014   Hydrosalpinx 06/30/2014   Back pain 05/30/2014   HYPERTENSION, BENIGN 04/05/2010   Past Medical History:  Diagnosis Date   Abdominal wall seroma    Anemia    Arthritis    Asthma    B12 deficiency    Back pain    Carpal tunnel syndrome, bilateral    Chest pain    Colitis    Constipation    Dysmenorrhea    Dyspnea    Fatty liver    GERD (gastroesophageal reflux disease)    HLD (hyperlipidemia)    HPV (human papilloma virus) infection    WARTS/ TCA TX   Hypertension    Keratoconus    Leg edema    Obesity    Obesity    Osteoarthritis    Palpitations    Pre-diabetes    Prediabetes    Seasonal allergies    Sleep apnea    Past Surgical History:  Procedure Laterality Date   GYNECOLOGIC CRYOSURGERY  1993   DYSPLASIA CERVIX/    HYSTEROSCOPY N/A 08/18/2019   Procedure: HYSTEROSCOPY WITH UNSUCCESSFUL  REMOVAL OF IUD AND DILATION OF THE CERVIX UNDER ULTRASOUND GUIDANCE;  Surgeon: Mindy Bruins, MD;  Location: [facility];  Service: Gynecology;  Laterality: N/A;  request 8:30am OR time in  IQUEUE held time for [employer] [facility] [employer] requests one hour   MYOMECTOMY  02/17/2011   Procedure: MYOMECTOMY;  Surgeon: Mindy Mass, MD;  Location: [facility];  Service: Gynecology;  Laterality: N/A;  I am hoping to get a 7:30am time on Dec 4, Dec 11 or Dec 12. It not available I will check on some 1:00 pm times. Thanks   MYOMECTOMY ABDOMINAL APPROACH  04/31/2007   OVARIAN CYST REMOVAL  02/17/2011   Procedure: OVARIAN CYSTECTOMY;  Surgeon: Mindy Mass, MD;  Location: [facility];  Service: Gynecology;  Laterality: Left;   Social History   Tobacco Use   Smoking status: Never   Smokeless tobacco: Never   Tobacco comments:    denies tobacco   Vaping Use   Vaping Use: Never used  Substance Use Topics   Alcohol use: No    Alcohol/week: 0.0 standard drinks   Drug use: No   Social History   Socioeconomic History   Marital  status: Single    Spouse name: Not on file   Number of children: 0   Years of education: Not on file   Highest education level: Not on file  Occupational History   Occupation: Sport and exercise psychologist, Environmental consultant college, admin    Employer: [employer]    Comment: [facility]  Tobacco Use   Smoking status: Never   Smokeless tobacco: Never   Tobacco comments:    denies tobacco   Vaping Use   Vaping Use: Never used  Substance and Sexual Activity   Alcohol use: No    Alcohol/week: 0.0 standard drinks   Drug use: No   Sexual activity: Not Currently    Birth control/protection: I.U.D.    Comment: mirena inserted 2016  Other Topics Concern   Not on file  Social History Narrative   Graduate degree from [employer] [facility] in woman/gender studies.   Lives with her mother.   Social Determinants of Health   Financial Resource Strain: Not on file  Food Insecurity: Not on file  Transportation Needs: Not on file  Physical Activity: Not on file  Stress: Not on file  Social Connections: Not on file  Intimate Partner Violence: Not on file   Family Status  Relation Name Status   Mother  Alive       HTN, dyslipidemia   Father  Deceased   Mat Aunt  (Not Specified)   Mindy [facility]  (Not Specified)   Mindy Collins  Deceased at age 52       heart problems-smoker. (her mother had MI at 56, smoker)   M[facility]  (Not Specified)   Neg Hx  (Not Specified)   Family History  Problem Relation Age of Onset   Hypertension Mother    Breast cancer Mother    Cancer Mother    Hyperlipidemia Mother    Thyroid disease Mother    Obesity Mother    Cancer Father        LUNG   Breast cancer Maternal Aunt    Cancer Paternal Aunt    Ovarian cancer Paternal Aunt    Heart disease Maternal Grandmother    Diabetes Maternal Grandfather    Pancreatic cancer Neg Hx    Stomach cancer Neg Hx    Liver cancer Neg Hx    Colon cancer Neg Hx    Allergies  Allergen Reactions   Esomeprazole Magnesium Shortness Of Breath    Nervousness; tolerates  Aciphex.   Adhesive [Tape] Itching and Rash    Itching and rash with adhesive tape  Latex Itching, Dermatitis and Hives    Skin blistering   Levocetirizine Dihydrochloride Other (See Comments)    Muscle jerks   Esomeprazole Anxiety     Patient Care Team: Maximiano Coss, NP as PCP - General (Adult Health Nurse Practitioner) Mindy Bruins, MD as Consulting Physician (Obstetrics and Gynecology)   Medications: Outpatient Medications Prior to Visit  Medication Sig   atorvastatin (LIPITOR) 40 MG tablet TAKE 1 TABLET(40 MG) BY MOUTH DAILY (Patient taking differently: Take 40 mg by mouth daily.)   Carboxymethylcellulose Sodium (REFRESH TEARS OP) Place 2 drops into both eyes daily as needed (dry eyes).   cyclobenzaprine (FLEXERIL) 5 MG tablet Take 5 mg by mouth 3 (three) times daily as needed for muscle spasms.   diclofenac (VOLTAREN) 75 MG EC tablet Take 1 tablet (75 mg total) by mouth 2 (two) times daily.   dicyclomine (BENTYL) 10 MG capsule Take 1 capsule (10 mg total) by mouth 3 (three) times daily before meals. (Patient taking differently: Take 10 mg by mouth 3 (three) times daily as needed for spasms.)   fluticasone (FLONASE) 50 MCG/ACT nasal spray Place 1 spray into both nostrils 2 (two) times daily as needed for allergies (allergies.).   Ketotifen Fumarate (ALAWAY OP) Place 2 drops into both eyes daily as needed (dry, itchy eyes).   levonorgestrel (MIRENA) 20 MCG/24HR IUD 1 each by Intrauterine route once.   loratadine (CLARITIN) 10 MG tablet Take 10 mg by mouth daily as needed for allergies.    metFORMIN (GLUCOPHAGE) 500 MG tablet Take 1 tablet (500 mg total) by mouth 2 (two) times daily with a meal.   Multiple Vitamin (MULTIVITAMIN PO) Take 1 tablet by mouth daily.   olmesartan (BENICAR) 20 MG tablet Take 1 tablet (20 mg total) by mouth daily.   vitamin B-12 (CYANOCOBALAMIN) 1000 MCG tablet Take 1,000 mcg by mouth daily.   Vitamin D, Ergocalciferol, (DRISDOL) 1.25 MG (50000  UNIT) CAPS capsule Take 1 capsule (50,000 Units total) by mouth every 7 (seven) days. (Patient taking differently: Take 50,000 Units by mouth every Sunday.)   No facility-administered medications prior to visit.    Review of Systems  Constitutional: Negative.   HENT: Negative.    Eyes: Negative.   Respiratory: Negative.    Cardiovascular: Negative.   Gastrointestinal: Negative.   Genitourinary: Negative.   Musculoskeletal: Negative.   Skin: Negative.   Neurological: Negative.   Psychiatric/Behavioral: Negative.    All other systems reviewed and are negative.  Last CBC Lab Results  Component Value Date   WBC 13.1 (H) 05/24/2021   HGB 13.1 05/24/2021   HCT 41.6 05/24/2021   MCV 84.6 05/24/2021   MCH 26.6 05/24/2021   RDW 14.6 05/24/2021   PLT 410 (H) 93/71/6967   Last metabolic panel Lab Results  Component Value Date   GLUCOSE 98 05/24/2021   NA 141 05/24/2021   K 3.9 05/24/2021   CL 104 05/24/2021   CO2 29 05/24/2021   BUN 9 05/24/2021   CREATININE 0.80 05/24/2021   GFRNONAA >60 05/24/2021   CALCIUM 9.3 05/24/2021   PROT 7.0 03/06/2021   ALBUMIN 4.3 03/06/2021   LABGLOB 2.7 03/06/2021   AGRATIO 1.6 03/06/2021   BILITOT 0.8 03/06/2021   ALKPHOS 149 (H) 03/06/2021   AST 26 03/06/2021   ALT 24 03/06/2021   ANIONGAP 8 05/24/2021   Last lipids Lab Results  Component Value Date   CHOL 148 03/06/2021   HDL 51 03/06/2021   LDLCALC 81 03/06/2021   LDLDIRECT 141.6  04/16/2010   TRIG 83 03/06/2021   CHOLHDL 2.9 03/06/2021   Last hemoglobin A1c Lab Results  Component Value Date   HGBA1C 6.1 (H) 03/06/2021   Last thyroid functions Lab Results  Component Value Date   TSH 2.390 03/06/2021   T3TOTAL 129 09/07/2017   Last vitamin D Lab Results  Component Value Date   VD25OH 23.4 (L) 03/06/2021   Last vitamin B12 and Folate Lab Results  Component Value Date   VITAMINB12 226 (L) 03/06/2021   FOLATE 7.3 03/06/2021        Objective:     BP (!) 121/56    Pulse 76   Temp 98 F (36.7 C) (Temporal)   Resp 18   Ht 5' 2.5" (1.588 m)   Wt 275 lb 12.8 oz (125.1 kg)   SpO2 100%   BMI 49.64 kg/m   BP Readings from Last 3 Encounters:  07/04/21 (!) 121/56  06/25/21 118/78  05/24/21 (!) 120/53   Wt Readings from Last 3 Encounters:  07/04/21 275 lb 12.8 oz (125.1 kg)  06/25/21 279 lb (126.6 kg)  04/22/21 276 lb (125.2 kg)   SpO2 Readings from Last 3 Encounters:  07/04/21 100%  06/25/21 99%  05/24/21 100%      Physical Exam Vitals and nursing note reviewed.  Constitutional:      General: She is not in acute distress.    Appearance: Normal appearance. She is obese. She is not ill-appearing, toxic-appearing or diaphoretic.  HENT:     Head: Normocephalic and atraumatic.     Right Ear: Tympanic membrane, ear canal and external ear normal. There is no impacted cerumen.     Left Ear: Tympanic membrane, ear canal and external ear normal. There is no impacted cerumen.     Nose: Nose normal. No congestion or rhinorrhea.     Mouth/Throat:     Mouth: Mucous membranes are moist.     Pharynx: Oropharynx is clear. No oropharyngeal exudate or posterior oropharyngeal erythema.  Eyes:     General: No scleral icterus.       Right eye: No discharge.        Left eye: No discharge.     Extraocular Movements: Extraocular movements intact.     Conjunctiva/sclera: Conjunctivae normal.     Pupils: Pupils are equal, round, and reactive to light.  Neck:     Vascular: No carotid bruit.  Cardiovascular:     Rate and Rhythm: Normal rate and regular rhythm.     Pulses: Normal pulses.     Heart sounds: Normal heart sounds. No murmur heard.   No friction rub. No gallop.  Pulmonary:     Effort: Pulmonary effort is normal. No respiratory distress.     Breath sounds: Normal breath sounds. No stridor. No wheezing, rhonchi or rales.  Chest:     Chest wall: No tenderness.  Abdominal:     General: Abdomen is flat. Bowel sounds are normal. There is no  distension.     Palpations: There is no Collins.     Tenderness: There is no abdominal tenderness. There is no right CVA tenderness, left CVA tenderness, guarding or rebound.     Hernia: No hernia is present.  Musculoskeletal:        General: No swelling, tenderness, deformity or signs of injury. Normal range of motion.     Cervical back: Normal range of motion and neck supple. No rigidity or tenderness.     Right lower leg: No edema.  Left lower leg: No edema.  Lymphadenopathy:     Cervical: No cervical adenopathy.  Skin:    General: Skin is warm and dry.     Capillary Refill: Capillary refill takes less than 2 seconds.     Coloration: Skin is not jaundiced or pale.     Findings: No bruising, erythema, lesion or rash.  Neurological:     General: No focal deficit present.     Mental Status: She is alert and oriented to person, place, and time. Mental status is at baseline.     Cranial Nerves: No cranial nerve deficit.     Sensory: No sensory deficit.     Motor: No weakness.     Coordination: Coordination normal.     Gait: Gait normal.     Deep Tendon Reflexes: Reflexes normal.  Psychiatric:        Mood and Affect: Mood normal.        Behavior: Behavior normal.        Thought Content: Thought content normal.        Judgment: Judgment normal.     No results found for any visits on 07/04/21.    Assessment & Plan:    Routine Health Maintenance and Physical Exam  Immunization History  Administered Date(s) Administered   Influenza Split 01/24/2011   Influenza,inj,Quad PF,6+ Mos 11/10/2014, 02/22/2016   PFIZER Comirnaty(Gray Top)Covid-19 Tri-Sucrose Vaccine 03/13/2020   PFIZER(Purple Top)SARS-COV-2 Vaccination 05/03/2019, 05/27/2019   Tdap 09/07/2012    Health Maintenance  Topic Date Due   COVID-19 Vaccine (4 - Booster for [place] series) 07/20/2021 (Originally 05/08/2020)   COLONOSCOPY (Pts 45-20yr Insurance coverage will need to be confirmed)  07/05/2022 (Originally  08/23/2017)   INFLUENZA VACCINE  09/10/2021   TETANUS/TDAP  09/08/2022   PAP SMEAR-Modifier  08/06/2025   Hepatitis C Screening  Completed   HIV Screening  Completed   HPV VACCINES  Aged Out    Discussed health benefits of physical activity, and encouraged her to engage in regular exercise appropriate for her age and condition.  Problem List Items Addressed This Visit   None Visit Diagnoses     Annual physical exam    -  Primary   Screening for endocrine, metabolic and immunity disorder       Relevant Orders   CBC with Differential/Platelet   Comprehensive metabolic panel   TSH   Hemoglobin A1c   Lipid screening       Relevant Orders   Lipid panel   Acute neck pain       Relevant Orders   Ambulatory referral to Neurosurgery   Ambulatory referral to Physical Therapy      Return in about 1 year (around 07/05/2022) for CPE and labs.     PLAN Exam unremarkable Labs collected. Will follow up with the patient as warranted. Refer to PT and Neurosurg for neck pain Patient encouraged to call clinic with any questions, comments, or concerns.   RMaximiano Coss NP

## 2021-07-09 ENCOUNTER — Encounter: Payer: 59 | Admitting: Registered Nurse

## 2021-07-16 ENCOUNTER — Encounter: Payer: Self-pay | Admitting: Adult Health

## 2021-07-16 ENCOUNTER — Other Ambulatory Visit: Payer: Self-pay | Admitting: Registered Nurse

## 2021-07-16 ENCOUNTER — Ambulatory Visit: Payer: 59 | Admitting: Adult Health

## 2021-07-16 ENCOUNTER — Encounter: Payer: Self-pay | Admitting: Registered Nurse

## 2021-07-16 VITALS — BP 124/77 | HR 84 | Ht 62.0 in | Wt 277.0 lb

## 2021-07-16 DIAGNOSIS — G4733 Obstructive sleep apnea (adult) (pediatric): Secondary | ICD-10-CM

## 2021-07-16 DIAGNOSIS — Z9989 Dependence on other enabling machines and devices: Secondary | ICD-10-CM

## 2021-07-16 DIAGNOSIS — E7849 Other hyperlipidemia: Secondary | ICD-10-CM

## 2021-07-16 DIAGNOSIS — E559 Vitamin D deficiency, unspecified: Secondary | ICD-10-CM

## 2021-07-16 DIAGNOSIS — I10 Essential (primary) hypertension: Secondary | ICD-10-CM

## 2021-07-16 DIAGNOSIS — R7303 Prediabetes: Secondary | ICD-10-CM

## 2021-07-16 MED ORDER — OLMESARTAN MEDOXOMIL 20 MG PO TABS: 20.0000 mg | ORAL_TABLET | Freq: Every day | ORAL | 1 refills | Status: AC

## 2021-07-16 MED ORDER — VITAMIN D (ERGOCALCIFEROL) 1.25 MG (50000 UNIT) PO CAPS: 50000.0000 [IU] | ORAL_CAPSULE | ORAL | 0 refills | Status: AC

## 2021-07-16 MED ORDER — METFORMIN HCL 500 MG PO TABS
500.0000 mg | ORAL_TABLET | Freq: Two times a day (BID) | ORAL | 1 refills | Status: AC
Start: 2021-07-16 — End: ?

## 2021-07-16 MED ORDER — ATORVASTATIN CALCIUM 40 MG PO TABS: 40.0000 mg | ORAL_TABLET | Freq: Every day | ORAL | 3 refills | Status: AC

## 2021-07-16 NOTE — Patient Instructions (Signed)
Continue using CPAP nightly and greater than 4 hours each night °If your symptoms worsen or you develop new symptoms please let us know.  ° °

## 2021-07-16 NOTE — Progress Notes (Signed)
PATIENT: Mindy Collins DOB: 1970/07/02  REASON FOR VISIT: follow up HISTORY FROM: patient PRIMARY NEUROLOGIST: Dr. Brett Fairy  Chief Complaint  Patient presents with   RM 5    Here alone. States she is out of compliance. She states the last mask that was sent to her was changed, different mask and headgear. She states it wasn't working well. She isn't sure if a piece broke off. She can't get anymore refills without a prescription.      HISTORY OF PRESENT ILLNESS: Today 07/16/21:  Mindy Collins is a 51 year old female with a history of obstructive sleep apnea on CPAP.  She returns today for follow-up.  She states that she is able to use CPAP due to her mask being broken not fitting appropriately.  She states initially the DME company sent her the wrong mask so she was using an old one trying to make it work.   HISTORY Mindy Collins is a 51 year old female with a history of obstructive sleep apnea on CPAP.  She returns today for follow-up.  Her download indicates that she use her machine 17 out of 30 days for compliance of 57%.  She used her machine greater than 4 hours 16 days for compliance of 53%.  On average she uses her machine 7 hours and 1 minute.  She reports that she has been working long hours for the last 2 months.  She states that the nights that she goes to bed extremely late she often will not put on the CPAP because she feels that she may oversleep.  When she is using the CPAP her residual AHI is 1.9 on 6 to 18 cm of water with EPR of 3.  Her leak in the 95th percentile is 32.8 L/min.  She states that she does feel the CPAP leaking at times.  She reports that she has gained some weight and is curious if the mask is still the right size.  Overall she reports that she does plan to get more consistent with using the CPAP.  She states that in the next 2 weeks her work hours should minimize and she should be able to use it more consistently.  She returns today for an evaluation.  REVIEW OF  SYSTEMS: Out of a complete 14 system review of symptoms, the patient complains only of the following symptoms, and all other reviewed systems are negative.  FSS 36 ESS 4  ALLERGIES: Allergies  Allergen Reactions   Esomeprazole Magnesium Shortness Of Breath    Nervousness; tolerates Aciphex.   Adhesive [Tape] Itching and Rash    Itching and rash with adhesive tape   Latex Itching, Dermatitis and Hives    Skin blistering   Levocetirizine Dihydrochloride Other (See Comments)    Muscle jerks   Esomeprazole Anxiety    HOME MEDICATIONS: Outpatient Medications Prior to Visit  Medication Sig Dispense Refill   atorvastatin (LIPITOR) 40 MG tablet TAKE 1 TABLET(40 MG) BY MOUTH DAILY (Patient taking differently: Take 40 mg by mouth daily.) 90 tablet 3   Carboxymethylcellulose Sodium (REFRESH TEARS OP) Place 2 drops into both eyes daily as needed (dry eyes).     cyclobenzaprine (FLEXERIL) 5 MG tablet Take 5 mg by mouth 3 (three) times daily as needed for muscle spasms.     diclofenac (VOLTAREN) 75 MG EC tablet Take 1 tablet (75 mg total) by mouth 2 (two) times daily. 30 tablet 0   dicyclomine (BENTYL) 10 MG capsule Take 1 capsule (10 mg total) by mouth 3 (  three) times daily before meals. (Patient taking differently: Take 10 mg by mouth 3 (three) times daily as needed for spasms.) 90 capsule 11   fluticasone (FLONASE) 50 MCG/ACT nasal spray Place 1 spray into both nostrils 2 (two) times daily as needed for allergies (allergies.).  0   Ketotifen Fumarate (ALAWAY OP) Place 2 drops into both eyes daily as needed (dry, itchy eyes).     levonorgestrel (MIRENA) 20 MCG/24HR IUD 1 each by Intrauterine route once.     loratadine (CLARITIN) 10 MG tablet Take 10 mg by mouth daily as needed for allergies.      metFORMIN (GLUCOPHAGE) 500 MG tablet Take 1 tablet (500 mg total) by mouth 2 (two) times daily with a meal. 90 tablet 0   Multiple Vitamin (MULTIVITAMIN PO) Take 1 tablet by mouth daily.     olmesartan  (BENICAR) 20 MG tablet Take 1 tablet (20 mg total) by mouth daily. 30 tablet 0   vitamin B-12 (CYANOCOBALAMIN) 1000 MCG tablet Take 1,000 mcg by mouth daily.     Vitamin D, Ergocalciferol, (DRISDOL) 1.25 MG (50000 UNIT) CAPS capsule Take 1 capsule (50,000 Units total) by mouth every 7 (seven) days. (Patient taking differently: Take 50,000 Units by mouth every Sunday.) 4 capsule 0   No facility-administered medications prior to visit.    PAST MEDICAL HISTORY: Past Medical History:  Diagnosis Date   Abdominal wall seroma    Anemia    Arthritis    Asthma    B12 deficiency    Back pain    Carpal tunnel syndrome, bilateral    Chest pain    Colitis    Constipation    Dysmenorrhea    Dyspnea    Fatty liver    GERD (gastroesophageal reflux disease)    HLD (hyperlipidemia)    HPV (human papilloma virus) infection    WARTS/ TCA TX   Hypertension    Keratoconus    Leg edema    Obesity    Obesity    Osteoarthritis    Palpitations    Pre-diabetes    Prediabetes    Seasonal allergies    Sleep apnea     PAST SURGICAL HISTORY: Past Surgical History:  Procedure Laterality Date   GYNECOLOGIC CRYOSURGERY  1993   DYSPLASIA CERVIX/    HYSTEROSCOPY N/A 08/18/2019   Procedure: HYSTEROSCOPY WITH UNSUCCESSFUL  REMOVAL OF IUD AND DILATION OF THE CERVIX UNDER ULTRASOUND GUIDANCE;  Surgeon: Princess Bruins, MD;  Location: [facility];  Service: Gynecology;  Laterality: N/A;  request 8:30am OR time in [place] held time for [facility] requests one hour   MYOMECTOMY  02/17/2011   Procedure: MYOMECTOMY;  Surgeon: Terrance Mass, MD;  Location: [facility];  Service: Gynecology;  Laterality: N/A;  I am hoping to get a 7:30am time on Dec 4, Dec 11 or Dec 12. It not available I will check on some 1:00 pm times. Thanks   MYOMECTOMY ABDOMINAL APPROACH  04/31/2007   OVARIAN CYST REMOVAL  02/17/2011   Procedure: OVARIAN CYSTECTOMY;  Surgeon: Terrance Mass, MD;  Location: [facility];  Service: Gynecology;   Laterality: Left;    FAMILY HISTORY: Family History  Problem Relation Age of Onset   Hypertension Mother    Breast cancer Mother    Cancer Mother    Hyperlipidemia Mother    Thyroid disease Mother    Obesity Mother    Cancer Father        LUNG   Breast cancer Maternal Aunt  Cancer Paternal Aunt    Ovarian cancer Paternal Aunt    Heart disease Maternal Grandmother    Diabetes Maternal Grandfather    Pancreatic cancer Neg Hx    Stomach cancer Neg Hx    Liver cancer Neg Hx    Colon cancer Neg Hx     SOCIAL HISTORY: Social History   Socioeconomic History   Marital status: Single    Spouse name: Not on file   Number of children: 0   Years of education: Not on file   Highest education level: Not on file  Occupational History   Occupation: Sport and exercise psychologist, Environmental consultant college, admin    Employer: [employer]    Comment: [employer]  Tobacco Use   Smoking status: Never   Smokeless tobacco: Never   Tobacco comments:    denies tobacco   Vaping Use   Vaping Use: Never used  Substance and Sexual Activity   Alcohol use: No    Alcohol/week: 0.0 standard drinks   Drug use: No   Sexual activity: Not Currently    Birth control/protection: I.U.D.    Comment: mirena inserted 2016  Other Topics Concern   Not on file  Social History Narrative   Graduate degree from [employer] in woman/gender studies.   Lives with her mother.   Right handed   Caffeine: 3-4 cups/day   Social Determinants of Health   Financial Resource Strain: Not on file  Food Insecurity: Not on file  Transportation Needs: Not on file  Physical Activity: Not on file  Stress: Not on file  Social Connections: Not on file  Intimate Partner Violence: Not on file      PHYSICAL EXAM  Vitals:   07/16/21 1424  BP: 124/77  Pulse: 84  Weight: 277 lb (125.6 kg)  Height: '5\' 2"'$  (1.575 m)   Body mass index is 50.66 kg/m.  Generalized: Well developed, in no acute distress  Chest: Lungs clear to auscultation  bilaterally  Neurological examination  Mentation: Alert oriented to time, place, history taking. Follows all commands speech and language fluent Cranial nerve II-XII: Extraocular movements were full, visual field were full on confrontational test Head turning and shoulder shrug  were normal and symmetric. Motor: The motor testing reveals 5 over 5 strength of all 4 extremities. Good symmetric motor tone is noted throughout.  Sensory: Sensory testing is intact to soft touch on all 4 extremities. No evidence of extinction is noted.  Gait and station: Gait is normal.    DIAGNOSTIC DATA (LABS, IMAGING, TESTING) - I reviewed patient records, labs, notes, testing and imaging myself where available.  Lab Results  Component Value Date   WBC 13.1 (H) 07/04/2021   HGB 12.9 07/04/2021   HCT 40.6 07/04/2021   MCV 82.9 07/04/2021   PLT 363.0 07/04/2021      Component Value Date/Time   NA 143 07/04/2021 1125   NA 146 (H) 03/06/2021 1006   K 4.0 07/04/2021 1125   CL 103 07/04/2021 1125   CO2 31 07/04/2021 1125   GLUCOSE 72 07/04/2021 1125   BUN 12 07/04/2021 1125   BUN 10 03/06/2021 1006   CREATININE 0.77 07/04/2021 1125   CREATININE 0.75 02/22/2016 1206   CALCIUM 9.3 07/04/2021 1125   PROT 7.2 07/04/2021 1125   PROT 7.0 03/06/2021 1006   ALBUMIN 4.4 07/04/2021 1125   ALBUMIN 4.3 03/06/2021 1006   AST 21 07/04/2021 1125   ALT 34 07/04/2021 1125   ALKPHOS 139 (H) 07/04/2021 1125   BILITOT 0.8 07/04/2021  1125   BILITOT 0.8 03/06/2021 1006   GFRNONAA >60 05/24/2021 1839   GFRAA 90 02/08/2020 1117   Lab Results  Component Value Date   CHOL 166 07/04/2021   HDL 56.60 07/04/2021   LDLCALC 96 07/04/2021   LDLDIRECT 141.6 04/16/2010   TRIG 66.0 07/04/2021   CHOLHDL 3 07/04/2021   Lab Results  Component Value Date   HGBA1C 6.1 07/04/2021   Lab Results  Component Value Date   VITAMINB12 226 (L) 03/06/2021   Lab Results  Component Value Date   TSH 2.77 07/04/2021       ASSESSMENT AND PLAN 51 y.o. year old female  has a past medical history of Abdominal wall seroma, Anemia, Arthritis, Asthma, B12 deficiency, Back pain, Carpal tunnel syndrome, bilateral, Chest pain, Colitis, Constipation, Dysmenorrhea, Dyspnea, Fatty liver, GERD (gastroesophageal reflux disease), HLD (hyperlipidemia), HPV (human papilloma virus) infection, Hypertension, Keratoconus, Leg edema, Obesity, Obesity, Osteoarthritis, Palpitations, Pre-diabetes, Prediabetes, Seasonal allergies, and Sleep apnea. here with:  OSA on CPAP  - order sent for new supplies - Restart using CPAP nightly and > 4 hours each night - F/U in 6 months or sooner if needed   Ward Givens, MSN, NP-C 07/16/2021, 2:45 PM [facility] [address], [place], [place] [address] [phone]

## 2021-07-22 ENCOUNTER — Telehealth: Payer: Self-pay | Admitting: Registered Nurse

## 2021-07-22 NOTE — Telephone Encounter (Signed)
Pt stats that her pharmacy is waiting on the prior authorization for her cyclobenzaprine 5 mg. PT stats that [facility] on [address] is the pharmacy to send request to.

## 2021-07-23 ENCOUNTER — Telehealth: Payer: Self-pay | Admitting: Adult Health

## 2021-07-23 NOTE — Telephone Encounter (Signed)
Order has been sent to [facility] requesting processing asap.

## 2021-07-23 NOTE — Telephone Encounter (Signed)
I do not have a PA for this patient medication. The medication was last sent in October 2022. Patient does not even have a refill.

## 2021-07-23 NOTE — Telephone Encounter (Signed)
Pt called wanting to know the update on her cpap supply Rx. Pt states that DME informed her that they have not received the Rx and she would like to know when this will be sent/resent.

## 2021-07-25 ENCOUNTER — Telehealth: Payer: Self-pay

## 2021-07-25 ENCOUNTER — Other Ambulatory Visit: Payer: Self-pay

## 2021-07-25 MED ORDER — CYCLOBENZAPRINE HCL 5 MG PO TABS
5.0000 mg | ORAL_TABLET | Freq: Three times a day (TID) | ORAL | 1 refills | Status: AC | PRN
Start: 2021-07-25 — End: ?

## 2021-07-25 NOTE — Telephone Encounter (Signed)
Left message for patient to call back to schedule endo colon.

## 2021-07-26 ENCOUNTER — Other Ambulatory Visit: Payer: Self-pay | Admitting: Registered Nurse

## 2021-07-26 DIAGNOSIS — I10 Essential (primary) hypertension: Secondary | ICD-10-CM

## 2021-07-26 NOTE — Telephone Encounter (Signed)
Left message for patient to call back  

## 2021-07-29 NOTE — Telephone Encounter (Signed)
Unable to reach patient x3 regarding scheduling colonoscopy. Patient has read mychart message regarding scheduling.

## 2021-07-30 ENCOUNTER — Other Ambulatory Visit: Payer: Self-pay | Admitting: Neurological Surgery

## 2021-07-30 DIAGNOSIS — M5412 Radiculopathy, cervical region: Secondary | ICD-10-CM

## 2021-08-11 ENCOUNTER — Other Ambulatory Visit: Payer: 59

## 2021-08-20 ENCOUNTER — Other Ambulatory Visit: Payer: 59

## 2021-09-18 ENCOUNTER — Encounter (INDEPENDENT_AMBULATORY_CARE_PROVIDER_SITE_OTHER): Payer: Self-pay

## 2021-09-27 ENCOUNTER — Telehealth: Payer: Self-pay

## 2021-09-27 NOTE — Telephone Encounter (Signed)
Left message for patient to call back in regards to rescheduling endo colon.

## 2021-09-30 NOTE — Telephone Encounter (Signed)
Left message for patient to call back  

## 2021-10-01 NOTE — Telephone Encounter (Signed)
Unable to reach patient on multiple attempt to schedule diagnostic egd/colon. Left detailed message for patient.

## 2021-11-06 ENCOUNTER — Telehealth: Payer: Self-pay | Admitting: Registered Nurse

## 2021-11-06 NOTE — Telephone Encounter (Signed)
Pt drop off a Physician Results form that needs to be completed by this Friday. Pt had a physical on 07/04/21 with Richard. Pt want to know if Dr.Greene or Dr.Tabori could complete this form for her.

## 2021-11-06 NOTE — Telephone Encounter (Signed)
Placed in front bin

## 2021-11-06 NOTE — Telephone Encounter (Signed)
Spoke w/ Mindy Collins and advised her that I will place in Dr Virgil Benedict to be signed folder . I explained to the Mindy Collins that typically we have 7 to 10 business  days to complete forms . I also advised the Mindy Collins that if the provider signs the form or if she does not I will notify her. She expressed verbal understanding

## 2021-11-07 NOTE — Telephone Encounter (Signed)
Called to inform pt this was complete, no answer LM. Will fax completed form to the requested fax number on the page

## 2021-11-07 NOTE — Telephone Encounter (Signed)
Form completed and returned to [facility]

## 2021-11-07 NOTE — Telephone Encounter (Signed)
I do not see this in your front "in coming" file nor do I see it in your sign folder, do you already have this form?

## 2021-12-13 ENCOUNTER — Ambulatory Visit (INDEPENDENT_AMBULATORY_CARE_PROVIDER_SITE_OTHER): Payer: 59

## 2021-12-13 ENCOUNTER — Ambulatory Visit (HOSPITAL_COMMUNITY)
Admission: EM | Admit: 2021-12-13 | Discharge: 2021-12-13 | Disposition: A | Discharge status: Home / Self Care | Payer: 59 | Attending: Emergency Medicine | Admitting: Emergency Medicine

## 2021-12-13 ENCOUNTER — Encounter (HOSPITAL_COMMUNITY): Payer: Self-pay

## 2021-12-13 DIAGNOSIS — M25562 Pain in left knee: Secondary | ICD-10-CM

## 2021-12-13 DIAGNOSIS — W19XXXA Unspecified fall, initial encounter: Secondary | ICD-10-CM | POA: Diagnosis not present

## 2021-12-13 DIAGNOSIS — M25512 Pain in left shoulder: Secondary | ICD-10-CM | POA: Diagnosis not present

## 2021-12-13 DIAGNOSIS — M7918 Myalgia, other site: Secondary | ICD-10-CM | POA: Diagnosis not present

## 2021-12-13 MED ORDER — DICLOFENAC SODIUM 75 MG PO TBEC: 75.0000 mg | DELAYED_RELEASE_TABLET | Freq: Two times a day (BID) | ORAL | 0 refills | Status: AC

## 2021-12-13 MED ORDER — DICLOFENAC SODIUM 75 MG PO TBEC: 75.0000 mg | DELAYED_RELEASE_TABLET | Freq: Two times a day (BID) | ORAL | 0 refills | Status: DC

## 2021-12-13 MED ORDER — KETOROLAC TROMETHAMINE 30 MG/ML IJ SOLN
INTRAMUSCULAR | Status: AC
  Filled 2021-12-13: qty 1

## 2021-12-13 MED ORDER — KETOROLAC TROMETHAMINE 30 MG/ML IJ SOLN
30.0000 mg | Freq: Once | INTRAMUSCULAR | Status: DC
Start: 2021-12-13 — End: 2021-12-13

## 2021-12-13 NOTE — Discharge Instructions (Addendum)
Diclofenac has been sent to the pharmacy, this is an anti-inflammatory.   As discussed, you may start taking the flexeril in your medication list. Be mindful that muscle relaxants can make you sleepy, please do not operate any heavy machinery or drive a car after taking the muscle relaxant.   [facility] has been attached if symptoms do not improve, I suggest that you follow-up with this office.   Information about ice application has been attached to the back of your paperwork.

## 2021-12-13 NOTE — ED Provider Notes (Signed)
[place]    CSN: [id number] Arrival date & time: 12/13/21  1513      History   Chief Complaint Chief Complaint  Patient presents with   Shoulder Pain   Knee Pain    HPI Mindy Collins is a 51 y.o. female.  Patient complaining of left shoulder and left knee pain that started today after having a fall.  Patient reports fall occurred due to tripping on the pavement.  Patient reports that she put her hands out to break the fall.  Patient reports she has an abrasion to right elbow and to left knee.  Patient denies loss of consciousness or hitting head.  Patient denies any use of blood thinners.  Patient denies any numbness or tingling in extremities.  History of arthritis.  History of contusion to left knee per patient statement.    Shoulder Pain Knee Pain   Past Medical History:  Diagnosis Date   Abdominal wall seroma    Anemia    Arthritis    Asthma    B12 deficiency    Back pain    Carpal tunnel syndrome, bilateral    Chest pain    Colitis    Constipation    Dysmenorrhea    Dyspnea    Fatty liver    GERD (gastroesophageal reflux disease)    HLD (hyperlipidemia)    HPV (human papilloma virus) infection    WARTS/ TCA TX   Hypertension    Keratoconus    Leg edema    Obesity    Obesity    Osteoarthritis    Palpitations    Pre-diabetes    Prediabetes    Seasonal allergies    Sleep apnea     Patient Active Problem List   Diagnosis Date Noted   Class 3 severe obesity with serious comorbidity and body mass index (BMI) of 50.0 to 59.9 in adult ([place]) 06/22/2018   Other fatigue 09/07/2017   Shortness of breath on exertion 09/07/2017   Moderate mixed hyperlipidemia not requiring statin therapy 09/07/2017   Vitamin D deficiency 09/07/2017   Transaminitis 03/08/2017   Vitamin B12 deficiency 03/08/2017   Primary insomnia 03/08/2017   Prediabetes 10/03/2016   Hyperlipidemia LDL goal <130 10/03/2016   Abdominal wall seroma 03/14/2016   Adnexal mass  03/03/2016   Thrombocytosis 07/31/2014   IUD (intrauterine device) in place 07/31/2014   DUB (dysfunctional uterine bleeding) 07/27/2014   Intramural leiomyoma of uterus 06/30/2014   Hydrosalpinx 06/30/2014   Back pain 05/30/2014   HYPERTENSION, BENIGN 04/05/2010    Past Surgical History:  Procedure Laterality Date   GYNECOLOGIC CRYOSURGERY  1993   DYSPLASIA CERVIX/    HYSTEROSCOPY N/A 08/18/2019   Procedure: HYSTEROSCOPY WITH UNSUCCESSFUL  REMOVAL OF IUD AND DILATION OF THE CERVIX UNDER ULTRASOUND GUIDANCE;  Surgeon: Princess Bruins, MD;  Location: [facility];  Service: Gynecology;  Laterality: N/A;  request 8:30am OR time in [place] held time for [facility] Gyn requests one hour   MYOMECTOMY  02/17/2011   Procedure: MYOMECTOMY;  Surgeon: Terrance Mass, MD;  Location: [facility];  Service: Gynecology;  Laterality: N/A;  I am hoping to get a 7:30am time on Dec 4, Dec 11 or Dec 12. It not available I will check on some 1:00 pm times. Thanks   MYOMECTOMY ABDOMINAL APPROACH  04/31/2007   OVARIAN CYST REMOVAL  02/17/2011   Procedure: OVARIAN CYSTECTOMY;  Surgeon: Terrance Mass, MD;  Location: [facility];  Service: Gynecology;  Laterality: Left;  OB History     Gravida  0   Para      Term      Preterm      AB      Living         SAB      IAB      Ectopic      Multiple      Live Births               Home Medications    Prior to Admission medications   Medication Sig Start Date End Date Taking? Authorizing Provider  atorvastatin (LIPITOR) 40 MG tablet Take 1 tablet (40 mg total) by mouth daily. 07/16/21   Maximiano Coss, NP  Carboxymethylcellulose Sodium (REFRESH TEARS OP) Place 2 drops into both eyes daily as needed (dry eyes).    [provider]  cyclobenzaprine (FLEXERIL) 5 MG tablet Take 1 tablet (5 mg total) by mouth 3 (three) times daily as needed for muscle spasms. 07/25/21   Maximiano Coss, NP  diclofenac (VOLTAREN) 75 MG EC tablet Take 1 tablet (75 mg  total) by mouth 2 (two) times daily. 12/13/21   Flossie Dibble, NP  dicyclomine (BENTYL) 10 MG capsule Take 1 capsule (10 mg total) by mouth 3 (three) times daily before meals. Patient taking differently: Take 10 mg by mouth 3 (three) times daily as needed for spasms. 04/10/21   Thornton Park, MD  fluticasone (FLONASE) 50 MCG/ACT nasal spray Place 1 spray into both nostrils 2 (two) times daily as needed for allergies (allergies.). 01/17/16   [provider]  Ketotifen Fumarate (ALAWAY OP) Place 2 drops into both eyes daily as needed (dry, itchy eyes).    [provider]  levonorgestrel (MIRENA) 20 MCG/24HR IUD 1 each by Intrauterine route once.    [provider]  loratadine (CLARITIN) 10 MG tablet Take 10 mg by mouth daily as needed for allergies.     [provider]  metFORMIN (GLUCOPHAGE) 500 MG tablet Take 1 tablet (500 mg total) by mouth 2 (two) times daily with a meal. 07/16/21   Maximiano Coss, NP  Multiple Vitamin (MULTIVITAMIN PO) Take 1 tablet by mouth daily.    [provider]  olmesartan (BENICAR) 20 MG tablet Take 1 tablet (20 mg total) by mouth daily. 07/16/21   Maximiano Coss, NP  vitamin B-12 (CYANOCOBALAMIN) 1000 MCG tablet Take 1,000 mcg by mouth daily.    [provider]  Vitamin D, Ergocalciferol, (DRISDOL) 1.25 MG (50000 UNIT) CAPS capsule Take 1 capsule (50,000 Units total) by mouth every 7 (seven) days. 07/16/21   Maximiano Coss, NP    Family History Family History  Problem Relation Age of Onset   Hypertension Mother    Breast cancer Mother    Cancer Mother    Hyperlipidemia Mother    Thyroid disease Mother    Obesity Mother    Cancer Father        LUNG   Breast cancer Maternal Aunt    Cancer Paternal Aunt    Ovarian cancer Paternal Aunt    Heart disease Maternal Grandmother    Diabetes Maternal Grandfather    Pancreatic cancer Neg Hx    Stomach cancer Neg Hx    Liver cancer Neg Hx    Colon cancer Neg Hx      Social History Social History   Tobacco Use   Smoking status: Never   Smokeless tobacco: Never   Tobacco comments:    denies tobacco  Vaping Use   Vaping Use: Never used  Substance Use Topics   Alcohol use: No    Alcohol/week: 0.0 standard drinks of alcohol   Drug use: No     Allergies   Esomeprazole magnesium, Adhesive [tape], Latex, Levocetirizine dihydrochloride, and Esomeprazole   Review of Systems Review of Systems  Musculoskeletal:  Positive for gait problem (LFT knee pain with ambulation.).       LFT shoulder pain and LFT knee pain. Reports increased pain with movement of LFT arm.  Denies swelling of LFT shoulder.   Skin:        Reports abrasion to LFT knee. Reports abrasion to RT arm above elbow.   Neurological:  Negative for weakness and numbness.     Physical Exam Triage Vital Signs ED Triage Vitals  Enc Vitals Group     BP 12/13/21 1604 (!) 158/94     Pulse Rate 12/13/21 1604 82     Resp 12/13/21 1604 16     Temp 12/13/21 1604 98 F (36.7 C)     Temp Source 12/13/21 1604 Oral     SpO2 12/13/21 1604 96 %     Weight --      Height --      Head Circumference --      Peak Flow --      Pain Score 12/13/21 1602 8     Pain Loc --      Pain Edu? --      Excl. in [place]? --    No data found.  Updated Vital Signs BP (!) 158/94 (BP Location: Right Arm)   Pulse 82   Temp 98 F (36.7 C) (Oral)   Resp 16   LMP  (LMP Unknown)   SpO2 96%      Physical Exam Vitals and nursing note reviewed.  Constitutional:      Appearance: Normal appearance.  Musculoskeletal:     Right shoulder: Normal.     Left shoulder: Tenderness present. No swelling, deformity, effusion, laceration, bony tenderness or crepitus. Normal range of motion. Normal strength. Normal pulse.     Right knee: Normal.     Left knee: No swelling, deformity, erythema, ecchymosis, lacerations, bony tenderness or crepitus. Normal range of motion. Tenderness present over the lateral joint line.  No patellar tendon tenderness. Normal pulse.     Right lower leg: Normal.     Left lower leg: No swelling, deformity, lacerations, tenderness or bony tenderness. No edema.     Comments: Pain upon palpation of posterior shoulder , along trapezius location.  Negative Hawking Kennedy test.  Patient reports pain with Flexion and extension of LFT shoulder.    Skin:    Findings: Abrasion (Abrasion present to RT upper arm above elbow. Abrasion present to LFT knee.) present.  Neurological:     Mental Status: She is alert.      UC Treatments / Results  Labs (all labs ordered are listed, but only abnormal results are displayed) Labs Reviewed - No data to display  EKG   Radiology DG Knee Complete 4 Views Left  Result Date: 12/13/2021 CLINICAL DATA:  Left knee pain.  Fall. EXAM: LEFT KNEE - COMPLETE 4+ VIEW COMPARISON:  None Available. FINDINGS: No joint effusion. Mild superior and inferior patellar degenerative osteophytosis. Likely mild patellofemoral joint space narrowing. The medial and lateral knee compartment joint spaces are maintained. No acute fracture or dislocation. IMPRESSION: Mild patellofemoral osteoarthritis. Electronically Signed   By: Yvonne Kendall M.D.   On:  12/13/2021 17:43   DG Shoulder Left  Result Date: 12/13/2021 CLINICAL DATA:  Fall.  Shoulder pain. EXAM: LEFT SHOULDER - 2+ VIEW COMPARISON:  None Available. FINDINGS: Mild peripheral acromioclavicular degenerative osteophytosis. No significant degenerative change of the glenohumeral joint. No acute fracture is seen. No dislocation. IMPRESSION: Mild acromioclavicular osteoarthritis. Electronically Signed   By: Yvonne Kendall M.D.   On: 12/13/2021 17:42    Procedures Procedures (including critical care time)  Medications Ordered in UC Medications - No data to display  Initial Impression / Assessment and Plan / UC Course  I have reviewed the triage vital signs and the nursing notes.  Pertinent labs & imaging results  that were available during my care of the patient were reviewed by me and considered in my medical decision making (see chart for details).     Patient was evaluated due to fall and muscle skeletal pain. Left knee and left shoulder x-ray was performed both showed degenerative changes but no fracture was noted.  Nonadherent bandage was placed on abrasion of right arm.  Ice was given to patient in office for pain management.  Ace wrap was given for left knee.  Diclofenac was sent to the pharmacy.  Patient reported that she already has a muscle relaxer at home, she was encouraged to use the muscle relaxer for the next 5 nights.  Patient was made aware of safety precautions with muscle relaxant.  She was given information for [facility] in the case that symptoms do not improve.  Patient was made aware of the timeline for symptom resolution.  Patient verbalized understanding of instructions.  Final Clinical Impressions(s) / UC Diagnoses   Final diagnoses:  Fall, initial encounter  Musculoskeletal pain     Discharge Instructions      Diclofenac has been sent to the pharmacy, this is an anti-inflammatory.   As discussed, you may start taking the flexeril in your medication list. Be mindful that muscle relaxants can make you sleepy, please do not operate any heavy machinery or drive a car after taking the muscle relaxant.   [facility] has been attached if symptoms do not improve, I suggest that you follow-up with this office.   Information about ice application has been attached to the back of your paperwork.      ED Prescriptions     Medication Sig Dispense Auth. Provider   diclofenac (VOLTAREN) 75 MG EC tablet  (Status: Discontinued) Take 1 tablet (75 mg total) by mouth 2 (two) times daily. 15 tablet Flossie Dibble, NP   diclofenac (VOLTAREN) 75 MG EC tablet Take 1 tablet (75 mg total) by mouth 2 (two) times daily. 15 tablet Flossie Dibble, NP      PDMP not reviewed this  encounter.   Flossie Dibble, NP 12/13/21 1915

## 2021-12-13 NOTE — ED Triage Notes (Signed)
Pt injured left shoulder and left knee due to a fall today.

## 2021-12-24 ENCOUNTER — Telehealth: Payer: Self-pay | Admitting: Adult Health

## 2021-12-24 NOTE — Telephone Encounter (Signed)
LVM and sent mychart msg advising pt of appt change- Megan out 12/8.

## 2022-01-17 ENCOUNTER — Telehealth: Payer: 59 | Admitting: Adult Health

## 2022-02-11 ENCOUNTER — Telehealth: Payer: 59 | Admitting: Adult Health

## 2022-04-15 ENCOUNTER — Ambulatory Visit (HOSPITAL_COMMUNITY)
Admission: EM | Admit: 2022-04-15 | Discharge: 2022-04-15 | Disposition: A | Discharge status: Home / Self Care | Payer: 59 | Attending: Internal Medicine | Admitting: Internal Medicine

## 2022-04-15 ENCOUNTER — Encounter (HOSPITAL_COMMUNITY): Payer: Self-pay | Admitting: *Deleted

## 2022-04-15 ENCOUNTER — Emergency Department (HOSPITAL_COMMUNITY)
Admission: EM | Admit: 2022-04-15 | Discharge: 2022-04-15 | Disposition: A | Discharge status: Home / Self Care | Payer: 59 | Attending: Emergency Medicine | Admitting: Emergency Medicine

## 2022-04-15 ENCOUNTER — Encounter (HOSPITAL_COMMUNITY): Payer: Self-pay | Admitting: Emergency Medicine

## 2022-04-15 ENCOUNTER — Other Ambulatory Visit: Payer: Self-pay

## 2022-04-15 ENCOUNTER — Emergency Department (HOSPITAL_COMMUNITY): Payer: 59

## 2022-04-15 DIAGNOSIS — R079 Chest pain, unspecified: Secondary | ICD-10-CM

## 2022-04-15 DIAGNOSIS — R072 Precordial pain: Secondary | ICD-10-CM | POA: Diagnosis present

## 2022-04-15 DIAGNOSIS — Z79899 Other long term (current) drug therapy: Secondary | ICD-10-CM | POA: Insufficient documentation

## 2022-04-15 DIAGNOSIS — R0602 Shortness of breath: Secondary | ICD-10-CM | POA: Diagnosis not present

## 2022-04-15 DIAGNOSIS — D72829 Elevated white blood cell count, unspecified: Secondary | ICD-10-CM | POA: Diagnosis not present

## 2022-04-15 DIAGNOSIS — I10 Essential (primary) hypertension: Secondary | ICD-10-CM | POA: Diagnosis not present

## 2022-04-15 DIAGNOSIS — J45909 Unspecified asthma, uncomplicated: Secondary | ICD-10-CM | POA: Insufficient documentation

## 2022-04-15 DIAGNOSIS — Z91040 Latex allergy status: Secondary | ICD-10-CM | POA: Diagnosis not present

## 2022-04-15 LAB — CBC WITH DIFFERENTIAL/PLATELET
Abs Immature Granulocytes: 0.05 10*3/uL (ref 0.00–0.07)
Basophils Absolute: 0 10*3/uL (ref 0.0–0.1)
Basophils Relative: 0 %
Eosinophils Absolute: 0.1 10*3/uL (ref 0.0–0.5)
Eosinophils Relative: 1 %
HCT: 39.8 % (ref 36.0–46.0)
Hemoglobin: 12.8 g/dL (ref 12.0–15.0)
Immature Granulocytes: 0 %
Lymphocytes Relative: 17 %
Lymphs Abs: 2.3 10*3/uL (ref 0.7–4.0)
MCH: 26.9 pg (ref 26.0–34.0)
MCHC: 32.2 g/dL (ref 30.0–36.0)
MCV: 83.6 fL (ref 80.0–100.0)
Monocytes Absolute: 1 10*3/uL (ref 0.1–1.0)
Monocytes Relative: 7 %
Neutro Abs: 9.8 10*3/uL — ABNORMAL HIGH (ref 1.7–7.7)
Neutrophils Relative %: 75 %
Platelets: 399 10*3/uL (ref 150–400)
RBC: 4.76 MIL/uL (ref 3.87–5.11)
RDW: 14.3 % (ref 11.5–15.5)
WBC: 13.2 10*3/uL — ABNORMAL HIGH (ref 4.0–10.5)
nRBC: 0 % (ref 0.0–0.2)

## 2022-04-15 LAB — COMPREHENSIVE METABOLIC PANEL
ALT: 28 U/L (ref 0–44)
AST: 23 U/L (ref 15–41)
Albumin: 3.8 g/dL (ref 3.5–5.0)
Alkaline Phosphatase: 125 U/L (ref 38–126)
Anion gap: 10 (ref 5–15)
BUN: 8 mg/dL (ref 6–20)
CO2: 28 mmol/L (ref 22–32)
Calcium: 9.3 mg/dL (ref 8.9–10.3)
Chloride: 102 mmol/L (ref 98–111)
Creatinine, Ser: 0.83 mg/dL (ref 0.44–1.00)
GFR, Estimated: >60 mL/min (ref >60)
Glucose, Bld: 95 mg/dL (ref 70–99)
Potassium: 3.7 mmol/L (ref 3.5–5.1)
Sodium: 140 mmol/L (ref 135–145)
Total Bilirubin: 1.2 mg/dL (ref 0.3–1.2)
Total Protein: 7.1 g/dL (ref 6.5–8.1)

## 2022-04-15 LAB — D-DIMER, QUANTITATIVE: D-Dimer, Quant: <0.27 ug/mL-FEU (ref 0.00–0.50)

## 2022-04-15 LAB — TROPONIN I (HIGH SENSITIVITY)
Troponin I (High Sensitivity): 3 ng/L (ref <18)
Troponin I (High Sensitivity): 5 ng/L (ref <18)

## 2022-04-15 NOTE — ED Triage Notes (Signed)
Pt reports intermittent centralized chest tightness today. Endorses shortness of breath during episodes. Denies n/v.

## 2022-04-15 NOTE — ED Provider Notes (Signed)
Patient present with chest pain/pressure since earlier today.  She was at work and had several episodes of chest pressure, associated with sob, felt hot.  No radiation of pain to the arm or neck per se.  She felt slightly dizzy as well with the episodes.  No pressure at this time.  No cardiac history. Vital stable;  no distress  EKG: NSR, no changes from previous  Recommended patient go to the ER for evaluation.     Rondel Oh, MD 04/15/22 1622

## 2022-04-15 NOTE — ED Triage Notes (Signed)
Pt report chest pressure all day . Pt was at work when Chest Pressure started. Pt also reports SHOB for past couple of weeks.

## 2022-04-15 NOTE — ED Provider Notes (Signed)
[place] Provider Note   CSN: [id number] Arrival date & time: 04/15/22  1622     History  Chief Complaint  Patient presents with   Chest Pain    Mindy Collins is a 52 y.o. female.  Patient presents to the emergency room complaining of chest pressure and shortness of breath.  The patient states she was at work and had intermittent episodes of substernal chest tightness and felt short of breath during these episodes.  The episodes resolved on their own.  She denies nausea, vomiting, radiation of symptoms.  She denies chest pain.  She went to urgent care for evaluation recommended she come to the emergency department for troponin levels.  The patient currently is pain-free and denies shortness of breath.  She does endorse increased stress and anxiety over the past day or 2.  Patient is planning to travel outside of the state on Friday.  Past medical history significant for hypertension, GERD, obesity, palpitations, dyspnea, asthma  HPI     Home Medications Prior to Admission medications   Medication Sig Start Date End Date Taking? Authorizing Provider  atorvastatin (LIPITOR) 40 MG tablet Take 1 tablet (40 mg total) by mouth daily. 07/16/21   Maximiano Coss, NP  Carboxymethylcellulose Sodium (REFRESH TEARS OP) Place 2 drops into both eyes daily as needed (dry eyes).    [provider]  cyclobenzaprine (FLEXERIL) 5 MG tablet Take 1 tablet (5 mg total) by mouth 3 (three) times daily as needed for muscle spasms. 07/25/21   Maximiano Coss, NP  diclofenac (VOLTAREN) 75 MG EC tablet Take 1 tablet (75 mg total) by mouth 2 (two) times daily. 12/13/21   Flossie Dibble, NP  dicyclomine (BENTYL) 10 MG capsule Take 1 capsule (10 mg total) by mouth 3 (three) times daily before meals. Patient taking differently: Take 10 mg by mouth 3 (three) times daily as needed for spasms. 04/10/21   Thornton Park, MD  fluticasone (FLONASE) 50 MCG/ACT nasal  spray Place 1 spray into both nostrils 2 (two) times daily as needed for allergies (allergies.). 01/17/16   [provider]  Ketotifen Fumarate (ALAWAY OP) Place 2 drops into both eyes daily as needed (dry, itchy eyes).    [provider]  levonorgestrel (MIRENA) 20 MCG/24HR IUD 1 each by Intrauterine route once.    [provider]  loratadine (CLARITIN) 10 MG tablet Take 10 mg by mouth daily as needed for allergies.     [provider]  metFORMIN (GLUCOPHAGE) 500 MG tablet Take 1 tablet (500 mg total) by mouth 2 (two) times daily with a meal. 07/16/21   Maximiano Coss, NP  Multiple Vitamin (MULTIVITAMIN PO) Take 1 tablet by mouth daily.    [provider]  olmesartan (BENICAR) 20 MG tablet Take 1 tablet (20 mg total) by mouth daily. 07/16/21   Maximiano Coss, NP  vitamin B-12 (CYANOCOBALAMIN) 1000 MCG tablet Take 1,000 mcg by mouth daily.    [provider]  Vitamin D, Ergocalciferol, (DRISDOL) 1.25 MG (50000 UNIT) CAPS capsule Take 1 capsule (50,000 Units total) by mouth every 7 (seven) days. 07/16/21   Maximiano Coss, NP      Allergies    Esomeprazole magnesium, Adhesive [tape], Latex, Levocetirizine dihydrochloride, and Esomeprazole    Review of Systems   Review of Systems  Respiratory:  Positive for chest tightness and shortness of breath.   Cardiovascular:  Negative for chest pain, palpitations and leg swelling.  Gastrointestinal:  Negative for  abdominal pain, nausea and vomiting.  Psychiatric/Behavioral:  The patient is nervous/anxious.     Physical Exam Updated Vital Signs BP (!) 147/86 (BP Location: Left Arm)   Pulse 76   Temp 98.5 F (36.9 C) (Oral)   Resp 16   Ht '5\' 2"'$  (1.575 m)   Wt 129.3 kg   SpO2 98%   BMI 52.13 kg/m  Physical Exam Vitals and nursing note reviewed.  Constitutional:      General: She is not in acute distress.    Appearance: She is well-developed. She is obese.  HENT:     Head: Normocephalic and  atraumatic.  Eyes:     Conjunctiva/sclera: Conjunctivae normal.  Cardiovascular:     Rate and Rhythm: Normal rate and regular rhythm.     Heart sounds: No murmur heard. Pulmonary:     Effort: Pulmonary effort is normal. No respiratory distress.     Breath sounds: Normal breath sounds.  Chest:     Chest wall: No tenderness.  Abdominal:     Palpations: Abdomen is soft.     Tenderness: There is no abdominal tenderness.  Musculoskeletal:        General: No swelling.     Cervical back: Neck supple.  Skin:    General: Skin is warm and dry.     Capillary Refill: Capillary refill takes less than 2 seconds.  Neurological:     Mental Status: She is alert.  Psychiatric:        Mood and Affect: Mood normal.     ED Results / Procedures / Treatments   Labs (all labs ordered are listed, but only abnormal results are displayed) Labs Reviewed  CBC WITH DIFFERENTIAL/PLATELET - Abnormal; Notable for the following components:      Result Value   WBC 13.2 (*)    Neutro Abs 9.8 (*)    All other components within normal limits  COMPREHENSIVE METABOLIC PANEL  D-DIMER, QUANTITATIVE  TROPONIN I (HIGH SENSITIVITY)  TROPONIN I (HIGH SENSITIVITY)    EKG None  Radiology DG Chest 1 View  Result Date: 04/15/2022 CLINICAL DATA:  Chest pain EXAM: CHEST  1 VIEW COMPARISON:  X-ray 05/24/21 FINDINGS: No consolidation, pneumothorax or effusion. Normal cardiopericardial silhouette without edema. Degenerative changes of the spine IMPRESSION: No acute cardiopulmonary disease Electronically Signed   By: Jill Side M.D.   On: 04/15/2022 18:03    Procedures Procedures    Medications Ordered in ED Medications - No data to display  ED Course/ Medical Decision Making/ A&P             HEART Score: 2                Medical Decision Making Amount and/or Complexity of Data Reviewed Labs: ordered.   This patient presents to the ED for concern of chest pain, this involves an extensive number of  treatment options, and is a complaint that carries with it a high risk of complications and morbidity.  The differential diagnosis includes ACS, anxiety, pulmonary embolism, dissection, pneumonia, others   Co morbidities that complicate the patient evaluation  History of dyspnea, chest pain   Additional history obtained:   External records from outside source obtained and reviewed including urgent care notes from earlier today   Lab Tests:  I Ordered, and personally interpreted labs.  The pertinent results include: Troponins of 3 and 5, negative D-dimer, unremarkable CMP, mildly elevated WBCs at 13.2   Imaging Studies ordered:  I ordered imaging studies including  chest x-ray I independently visualized and interpreted imaging which showed no acute disease I agree with the radiologist interpretation   Cardiac Monitoring: / EKG:  The patient was maintained on a cardiac monitor.  I personally viewed and interpreted the cardiac monitored which showed an underlying rhythm of: Sinus rhythm   Test / Admission - Considered:  Patient has a low heart score, negative troponins, nonischemic EKG.  Very low clinical suspicion of ACS.  D-dimer was negative suggesting no pulmonary embolism.  No clinical signs of dissection.  No pneumonia on chest x-ray.  Upon further discussion with the patient he does endorse increased stress and anxiety over the past day or so.  Question if this may be the driving factor in her chest pain.  At this time the patient's pain is subsided almost completely and she is no longer short of breath.  Plan to discharge patient home with plans for outpatient follow-up with cardiology.         Final Clinical Impression(s) / ED Diagnoses Final diagnoses:  Chest pain, unspecified type    Rx / DC Orders ED Discharge Orders          Ordered    Ambulatory referral to Cardiology       Comments: If you have not heard from the Cardiology office within the next 72  hours please call [phone].   04/15/22 2159              Dorothyann Peng, PA-C 04/15/22 2349    Sherwood Gambler, MD 04/17/22 [phone]

## 2022-04-15 NOTE — ED Provider Triage Note (Signed)
Emergency Medicine Provider Triage Evaluation Note  Mindy Collins , a 52 y.o. female  was evaluated in triage.  Pt complains of chest pain.  Patient reports that she was at work when she multiple episodes of central chest pain/pressure without radiation into any extremities.  Also experiencing some shortness of breath during these episodes but denies any nausea, vomiting, diaphoresis.  No prior history of any cardiac abnormalities not currently taking any blood thinning medication.    Review of Systems  Positive: As above Negative: As above  Physical Exam  BP (!) 144/84 (BP Location: Right Arm)   Pulse 92   Temp 98.1 F (36.7 C)   Resp 18   Ht '5\' 2"'$  (1.575 m)   Wt 129.3 kg   SpO2 96%   BMI 52.13 kg/m  Gen:   Awake, no distress   Resp:  Normal effort  MSK:   Moves extremities without difficulty  Other:    Medical Decision Making  Medically screening exam initiated at 5:21 PM.  Appropriate orders placed.  Mindy Collins was informed that the remainder of the evaluation will be completed by another provider, this initial triage assessment does not replace that evaluation, and the importance of remaining in the ED until their evaluation is complete.     Mindy Heller, PA-C 04/15/22 1722

## 2022-04-15 NOTE — ED Notes (Signed)
Patient verbalizes understanding of discharge instructions. Opportunity for questioning and answers were provided. Armband removed by staff, pt discharged from ED. Pt ambulatory to ED waiting room with steady gait.  

## 2022-04-15 NOTE — Discharge Instructions (Addendum)
You were evaluated today for chest pain.  Your workup was reassuring for no signs of acute coronary syndrome, no clot burden, no pneumonia.  The cause of your chest pain may be stress/anxiety related but it is unclear at this time.  If your chest pain worsens or you develop shortness of breath or other life-threatening symptoms please return to the emergency department.  I have placed a referral for outpatient follow-up with cardiology.  Please keep this appointment.  If you do not hear from them within 3 days you may contact their office to schedule an appointment.

## 2022-04-15 NOTE — Discharge Instructions (Signed)
Patient recommended to go to the ER for further evaluation.

## 2022-06-09 ENCOUNTER — Encounter: Payer: 59 | Admitting: Adult Health

## 2022-06-09 ENCOUNTER — Encounter: Payer: Self-pay | Admitting: Adult Health

## 2022-07-09 ENCOUNTER — Encounter: Payer: 59 | Admitting: Registered Nurse

## 2023-05-08 ENCOUNTER — Other Ambulatory Visit: Payer: Self-pay

## 2023-05-08 ENCOUNTER — Encounter (HOSPITAL_COMMUNITY): Payer: Self-pay

## 2023-05-08 ENCOUNTER — Emergency Department (HOSPITAL_COMMUNITY)

## 2023-05-08 ENCOUNTER — Emergency Department (HOSPITAL_COMMUNITY)
Admission: EM | Admit: 2023-05-08 | Discharge: 2023-05-08 | Disposition: A | Discharge status: Home / Self Care | Attending: Emergency Medicine | Admitting: Emergency Medicine

## 2023-05-08 DIAGNOSIS — S0990XA Unspecified injury of head, initial encounter: Secondary | ICD-10-CM

## 2023-05-08 DIAGNOSIS — F0781 Postconcussional syndrome: Secondary | ICD-10-CM

## 2023-05-08 DIAGNOSIS — Z91040 Latex allergy status: Secondary | ICD-10-CM | POA: Insufficient documentation

## 2023-05-08 DIAGNOSIS — W228XXA Striking against or struck by other objects, initial encounter: Secondary | ICD-10-CM | POA: Insufficient documentation

## 2023-05-08 DIAGNOSIS — Y9389 Activity, other specified: Secondary | ICD-10-CM | POA: Insufficient documentation

## 2023-05-08 NOTE — ED Triage Notes (Signed)
 Pt fell at work and hit head in February. Pt being followed for concussion. Pt states today she was carrying plastic totes and lids inside of totes hit same side of head. Pt states that since then pt has had a headache, left eye pain, and cross eyed vision.

## 2023-05-08 NOTE — ED Provider Notes (Signed)
 Patient's care assumed by me at 3:00 PM.  Patient fell and struck her head.  Patient has been having a headache since the fall.  Patient also has had a recent concussion that she is still symptomatic from.  CT scan head returned showing no evidence of acute injury.    Elson Areas, [place] 05/08/23 1703    Maia Plan, MD 05/11/23 [phone]

## 2023-05-08 NOTE — ED Provider Notes (Signed)
 [facility] EMERGENCY DEPARTMENT AT [facility] Provider Note   CSN: [id number] Arrival date & time: 05/08/23  1411     History  Chief Complaint  Patient presents with   Mindy Collins    Mindy Collins is a 53 y.o. female.  Patient with postconcussive syndrome after a head injury occurring about a month ago --presents to the emergency department today for evaluation of headache and worsening concussion symptoms.  Patient was carrying some plastic tubs today and the heavy lids slid and struck the patient in the left head.  She had worsening of left-sided headache as well as some double vision.  Mild neck soreness that occurred afterwards.  Patient has been dealing with concussion syndromes including recurrent headaches, double vision since her injury.  She is currently being followed at sports medicine concussion clinic.[facility]  She is undergoing physical therapy.  Recently she states that her vision symptoms have been improving.  No treatments prior to arrival.  Injury occurred approximately 2 hours prior to arrival.  No loss of consciousness.  No weakness in the arms or legs.       Home Medications Prior to Admission medications   Medication Sig Start Date End Date Taking? Authorizing Provider  atorvastatin (LIPITOR) 40 MG tablet Take 1 tablet (40 mg total) by mouth daily. 07/16/21   Janeece Agee, NP  Carboxymethylcellulose Sodium (REFRESH TEARS OP) Place 2 drops into both eyes daily as needed (dry eyes).    [provider]  cyclobenzaprine (FLEXERIL) 5 MG tablet Take 1 tablet (5 mg total) by mouth 3 (three) times daily as needed for muscle spasms. 07/25/21   Janeece Agee, NP  diclofenac (VOLTAREN) 75 MG EC tablet Take 1 tablet (75 mg total) by mouth 2 (two) times daily. 12/13/21   Debby Freiberg, NP  dicyclomine (BENTYL) 10 MG capsule Take 1 capsule (10 mg total) by mouth 3 (three) times daily before meals. Patient taking differently: Take 10 mg by mouth 3 (three) times daily  as needed for spasms. 04/10/21   Tressia Danas, MD  fluticasone (FLONASE) 50 MCG/ACT nasal spray Place 1 spray into both nostrils 2 (two) times daily as needed for allergies (allergies.). 01/17/16   [provider]  Ketotifen Fumarate (ALAWAY OP) Place 2 drops into both eyes daily as needed (dry, itchy eyes).    [provider]  levonorgestrel (MIRENA) 20 MCG/24HR IUD 1 each by Intrauterine route once.    [provider]  loratadine (CLARITIN) 10 MG tablet Take 10 mg by mouth daily as needed for allergies.     [provider]  metFORMIN (GLUCOPHAGE) 500 MG tablet Take 1 tablet (500 mg total) by mouth 2 (two) times daily with a meal. 07/16/21   Janeece Agee, NP  Multiple Vitamin (MULTIVITAMIN PO) Take 1 tablet by mouth daily.    [provider]  olmesartan (BENICAR) 20 MG tablet Take 1 tablet (20 mg total) by mouth daily. 07/16/21   Janeece Agee, NP  vitamin B-12 (CYANOCOBALAMIN) 1000 MCG tablet Take 1,000 mcg by mouth daily.    [provider]  Vitamin D, Ergocalciferol, (DRISDOL) 1.25 MG (50000 UNIT) CAPS capsule Take 1 capsule (50,000 Units total) by mouth every 7 (seven) days. 07/16/21   Janeece Agee, NP      Allergies    Esomeprazole magnesium, Adhesive [tape], Latex, Levocetirizine dihydrochloride, and Esomeprazole    Review of Systems   Review of Systems  Physical Exam Updated Vital Signs BP (!) 159/84 (BP Location: Right Arm)  Pulse 98   Temp 98.3 F (36.8 C) (Oral)   Resp 16   Ht 5\' 2"  (1.575 m)   Wt 130.6 kg   SpO2 100%   BMI 52.68 kg/m  Physical Exam Vitals and nursing note reviewed.  Constitutional:      Appearance: She is well-developed.  HENT:     Head: Normocephalic and atraumatic. No raccoon eyes or Battle's sign.     Right Ear: Tympanic membrane, ear canal and external ear normal. No hemotympanum.     Left Ear: Tympanic membrane, ear canal and external ear normal. No hemotympanum.     Nose: Nose normal.      Mouth/Throat:     Pharynx: Uvula midline.  Eyes:     General: Lids are normal.     Extraocular Movements:     Right eye: No nystagmus.     Left eye: No nystagmus.     Conjunctiva/sclera: Conjunctivae normal.     Pupils: Pupils are equal, round, and reactive to light.     Comments: No visible hyphema noted.  No nystagmus.  Cardiovascular:     Rate and Rhythm: Normal rate and regular rhythm.  Pulmonary:     Effort: Pulmonary effort is normal.     Breath sounds: Normal breath sounds.  Abdominal:     Palpations: Abdomen is soft.     Tenderness: There is no abdominal tenderness.  Musculoskeletal:     Cervical back: Normal range of motion and neck supple. No tenderness or bony tenderness.     Thoracic back: No tenderness or bony tenderness.     Lumbar back: No tenderness or bony tenderness.  Skin:    General: Skin is warm and dry.  Neurological:     Mental Status: She is alert and oriented to person, place, and time.     GCS: GCS eye subscore is 4. GCS verbal subscore is 5. GCS motor subscore is 6.     Cranial Nerves: No cranial nerve deficit.     Sensory: No sensory deficit.     Coordination: Coordination normal.     ED Results / Procedures / Treatments   Labs (all labs ordered are listed, but only abnormal results are displayed) Labs Reviewed - No data to display  EKG None  Radiology No results found.  Procedures Procedures    Medications Ordered in ED Medications - No data to display  ED Course/ Medical Decision Making/ A&P    Patient seen and examined. History obtained directly from patient.   Labs/EKG: None ordered  Imaging: Ordered CT head without contrast  Medications/Fluids: None ordered Most recent vital signs reviewed and are as follows: BP (!) 159/84 (BP Location: Right Arm)   Pulse 98   Temp 98.3 F (36.8 C) (Oral)   Resp 16   Ht 5\' 2"  (1.575 m)   Wt 130.6 kg   SpO2 100%   BMI 52.68 kg/m   Initial impression: Minor head injury and  likely exacerbation of concussion symptoms.  Head imaging ordered as precaution.  3:00 PM signout to [place] PA-C at shift change.  Plan follow-up CT results.  If negative, patient can be discharged with continued concussion precautions and treatment.                                  Medical Decision Making Amount and/or Complexity of Data Reviewed Radiology: ordered.   Patient with minor head injury, reassuring neuroexam.  Pending imaging.        Final Clinical Impression(s) / ED Diagnoses Final diagnoses:  Minor head injury, initial encounter  Post concussion syndrome    Rx / DC Orders ED Discharge Orders     None         Renne Crigler, PA-C 05/08/23 1451    Lonell Grandchild, MD 05/09/23 1818

## 2023-05-08 NOTE — Discharge Instructions (Addendum)
 Please read and follow all provided instructions.  Your diagnoses today include:  1. Minor head injury, initial encounter   2. Post concussion syndrome     Tests performed today include: CT scan of your head that did not show any serious injury. Vital signs. See below for your results today.   Medications prescribed:  None  Take any prescribed medications only as directed.  Home care instructions:  Follow any educational materials contained in this packet.  Continue home concussion precautions, physical therapy and over-the-counter treatments.   Follow-up instructions: Please follow-up with your concussion provider as planned for further evaluation of your symptoms.   Return instructions:  SEEK IMMEDIATE MEDICAL ATTENTION IF: There is confusion or drowsiness (although children frequently become drowsy after injury).  You cannot awaken the injured person.  You have more than one episode of vomiting.  You notice dizziness or unsteadiness which is getting worse, or inability to walk.  You have convulsions or unconsciousness.  You experience severe, persistent headaches not relieved by Tylenol. You cannot use arms or legs normally.  There are changes in pupil sizes. (This is the black center in the colored part of the eye)  There is clear or bloody discharge from the nose or ears.  You have change in speech, vision, swallowing, or understanding.  Localized weakness, numbness, tingling, or change in bowel or bladder control. You have any other emergent concerns.  Additional Information: You have had a head injury which does not appear to require admission at this time.  Your vital signs today were: BP (!) 159/84 (BP Location: Right Arm)   Pulse 98   Temp 98.3 F (36.8 C) (Oral)   Resp 16   Ht 5\' 2"  (1.575 m)   Wt 130.6 kg   SpO2 100%   BMI 52.68 kg/m  If your blood pressure (BP) was elevated above 135/85 this visit, please have this repeated by your doctor within one  month. --------------

## 2023-07-07 ENCOUNTER — Telehealth: Payer: Self-pay | Admitting: Adult Health

## 2023-07-07 DIAGNOSIS — Z0289 Encounter for other administrative examinations: Secondary | ICD-10-CM

## 2023-07-07 NOTE — Telephone Encounter (Signed)
 Pt has made an appointment with Megan, NP for 5-28

## 2023-07-07 NOTE — Telephone Encounter (Signed)
 I called and LMVM for pt. Returning her call about her machine.  We have last seen her 2023, will need appt.  Also has she been in touch with her DME co about this issue?

## 2023-07-07 NOTE — Telephone Encounter (Signed)
 DME [employer]. Set up date 06-16-2018.

## 2023-07-07 NOTE — Progress Notes (Unsigned)
 PATIENT: Mindy Collins DOB: 16-Nov-1970  REASON FOR VISIT: follow up HISTORY FROM: patient PRIMARY NEUROLOGIST: Dr. Albertina Hugger  No chief complaint on file.    HISTORY OF PRESENT ILLNESS: Today   Mindy Collins is a 53 y.o. female with a history of OSA on CPAP. Returns today for follow-up.      07/16/21: Mindy Collins is a 53 year old female with a history of obstructive sleep apnea on CPAP.  She returns today for follow-up.  She states that she is able to use CPAP due to her mask being broken not fitting appropriately.  She states initially the DME company sent her the wrong mask so she was using an old one trying to make it work.   HISTORY Mindy Collins is a 53 year old female with a history of obstructive sleep apnea on CPAP.  She returns today for follow-up.  Her download indicates that she use her machine 17 out of 30 days for compliance of 57%.  She used her machine greater than 4 hours 16 days for compliance of 53%.  On average she uses her machine 7 hours and 1 minute.  She reports that she has been working long hours for the last 2 months.  She states that the nights that she goes to bed extremely late she often will not put on the CPAP because she feels that she may oversleep.  When she is using the CPAP her residual AHI is 1.9 on 6 to 18 cm of water with EPR of 3.  Her leak in the 95th percentile is 32.8 L/min.  She states that she does feel the CPAP leaking at times.  She reports that she has gained some weight and is curious if the mask is still the right size.  Overall she reports that she does plan to get more consistent with using the CPAP.  She states that in the next 2 weeks her work hours should minimize and she should be able to use it more consistently.  She returns today for an evaluation.  REVIEW OF SYSTEMS: Out of a complete 14 system review of symptoms, the patient complains only of the following symptoms, and all other reviewed systems are negative.  FSS 36 ESS  4  ALLERGIES: Allergies  Allergen Reactions   Esomeprazole Magnesium Shortness Of Breath    Nervousness; tolerates Aciphex .   Adhesive [Tape] Itching and Rash    Itching and rash with adhesive tape   Latex Itching, Dermatitis and Hives    Skin blistering   Levocetirizine Dihydrochloride Other (See Comments)    Muscle jerks   Esomeprazole Anxiety    HOME MEDICATIONS: Outpatient Medications Prior to Visit  Medication Sig Dispense Refill   atorvastatin  (LIPITOR) 40 MG tablet Take 1 tablet (40 mg total) by mouth daily. 90 tablet 3   Carboxymethylcellulose Sodium (REFRESH TEARS OP) Place 2 drops into both eyes daily as needed (dry eyes).     cyclobenzaprine  (FLEXERIL ) 5 MG tablet Take 1 tablet (5 mg total) by mouth 3 (three) times daily as needed for muscle spasms. 30 tablet 1   diclofenac  (VOLTAREN ) 75 MG EC tablet Take 1 tablet (75 mg total) by mouth 2 (two) times daily. 15 tablet 0   dicyclomine  (BENTYL ) 10 MG capsule Take 1 capsule (10 mg total) by mouth 3 (three) times daily before meals. (Patient taking differently: Take 10 mg by mouth 3 (three) times daily as needed for spasms.) 90 capsule 11   fluticasone (FLONASE) 50 MCG/ACT nasal spray Place 1 spray  into both nostrils 2 (two) times daily as needed for allergies (allergies.).  0   Ketotifen Fumarate (ALAWAY OP) Place 2 drops into both eyes daily as needed (dry, itchy eyes).     levonorgestrel  (MIRENA ) 20 MCG/24HR IUD 1 each by Intrauterine route once.     loratadine (CLARITIN) 10 MG tablet Take 10 mg by mouth daily as needed for allergies.      metFORMIN  (GLUCOPHAGE ) 500 MG tablet Take 1 tablet (500 mg total) by mouth 2 (two) times daily with a meal. 90 tablet 1   Multiple Vitamin (MULTIVITAMIN PO) Take 1 tablet by mouth daily.     olmesartan  (BENICAR ) 20 MG tablet Take 1 tablet (20 mg total) by mouth daily. 90 tablet 1   vitamin B-12 (CYANOCOBALAMIN) 1000 MCG tablet Take 1,000 mcg by mouth daily.     Vitamin D , Ergocalciferol ,  (DRISDOL ) 1.25 MG (50000 UNIT) CAPS capsule Take 1 capsule (50,000 Units total) by mouth every 7 (seven) days. 12 capsule 0   No facility-administered medications prior to visit.    PAST MEDICAL HISTORY: Past Medical History:  Diagnosis Date   Abdominal wall seroma    Anemia    Arthritis    Asthma    B12 deficiency    Back pain    Carpal tunnel syndrome, bilateral    Chest pain    Colitis    Constipation    Dysmenorrhea    Dyspnea    Fatty liver    GERD (gastroesophageal reflux disease)    HLD (hyperlipidemia)    HPV (human papilloma virus) infection    WARTS/ TCA TX   Hypertension    Keratoconus    Leg edema    Obesity    Obesity    Osteoarthritis    Palpitations    Pre-diabetes    Prediabetes    Seasonal allergies    Sleep apnea     PAST SURGICAL HISTORY: Past Surgical History:  Procedure Laterality Date   GYNECOLOGIC CRYOSURGERY  1993   DYSPLASIA CERVIX/    HYSTEROSCOPY N/A 08/18/2019   Procedure: HYSTEROSCOPY WITH UNSUCCESSFUL  REMOVAL OF IUD AND DILATION OF THE CERVIX UNDER ULTRASOUND GUIDANCE;  Surgeon: Lavoie, Marie-Lyne, MD;  Location: [facility] OR;  Service: Gynecology;  Laterality: N/A;  request 8:30am OR time in IQUEUE held time for [facility]yn requests one hour   MYOMECTOMY  02/17/2011   Procedure: MYOMECTOMY;  Surgeon: Davia Erps, MD;  Location: [facility] ORS;  Service: Gynecology;  Laterality: N/A;  I am hoping to get a 7:30am time on Dec 4, Dec 11 or Dec 12. It not available I will check on some 1:00 pm times. Thanks   MYOMECTOMY ABDOMINAL APPROACH  04/31/2007   OVARIAN CYST REMOVAL  02/17/2011   Procedure: OVARIAN CYSTECTOMY;  Surgeon: Davia Erps, MD;  Location: [facility] ORS;  Service: Gynecology;  Laterality: Left;    FAMILY HISTORY: Family History  Problem Relation Age of Onset   Hypertension Mother    Breast cancer Mother    Cancer Mother    Hyperlipidemia Mother    Thyroid disease Mother    Obesity Mother    Cancer Father        LUNG   Breast  cancer Maternal Aunt    Cancer Paternal Aunt    Ovarian cancer Paternal Aunt    Heart disease Maternal Grandmother    Diabetes Maternal Grandfather    Pancreatic cancer Neg Hx    Stomach cancer Neg Hx    Liver cancer Neg  Hx    Colon cancer Neg Hx     SOCIAL HISTORY: Social History   Socioeconomic History   Marital status: Single    Spouse name: Not on file   Number of children: 0   Years of education: Not on file   Highest education level: Not on file  Occupational History   Occupation: Gaffer, Theatre manager college, admin    Employer: [employer]    Comment: [employer]  Tobacco Use   Smoking status: Never   Smokeless tobacco: Never   Tobacco comments:    denies tobacco   Vaping Use   Vaping status: Never Used  Substance and Sexual Activity   Alcohol use: No    Alcohol/week: 0.0 standard drinks of alcohol   Drug use: No   Sexual activity: Not Currently    Birth control/protection: I.U.D.    Comment: mirena  inserted 2016  Other Topics Concern   Not on file  Social History Narrative   Graduate degree from [employer] in woman/gender studies.   Lives with her mother.   Right handed   Caffeine : 3-4 cups/day   Social Drivers of Corporate investment banker Strain: Not on file  Food Insecurity: Not on file  Transportation Needs: Not on file  Physical Activity: Not on file  Stress: Not on file  Social Connections: Not on file  Intimate Partner Violence: Not on file      PHYSICAL EXAM  There were no vitals filed for this visit.  There is no height or weight on file to calculate BMI.  Generalized: Well developed, in no acute distress  Chest: Lungs clear to auscultation bilaterally  Neurological examination  Mentation: Alert oriented to time, place, history taking. Follows all commands speech and language fluent Cranial nerve II-XII: Extraocular movements were full, visual field were full on confrontational test Head turning and shoulder shrug  were normal and  symmetric. Motor: The motor testing reveals 5 over 5 strength of all 4 extremities. Good symmetric motor tone is noted throughout.  Sensory: Sensory testing is intact to soft touch on all 4 extremities. No evidence of extinction is noted.  Gait and station: Gait is normal.    DIAGNOSTIC DATA (LABS, IMAGING, TESTING) - I reviewed patient records, labs, notes, testing and imaging myself where available.  Lab Results  Component Value Date   WBC 13.2 (H) 04/15/2022   HGB 12.8 04/15/2022   HCT 39.8 04/15/2022   MCV 83.6 04/15/2022   PLT 399 04/15/2022      Component Value Date/Time   NA 140 04/15/2022 1729   NA 146 (H) 03/06/2021 1006   K 3.7 04/15/2022 1729   CL 102 04/15/2022 1729   CO2 28 04/15/2022 1729   GLUCOSE 95 04/15/2022 1729   BUN 8 04/15/2022 1729   BUN 10 03/06/2021 1006   CREATININE 0.83 04/15/2022 1729   CREATININE 0.75 02/22/2016 1206   CALCIUM  9.3 04/15/2022 1729   PROT 7.1 04/15/2022 1729   PROT 7.0 03/06/2021 1006   ALBUMIN 3.8 04/15/2022 1729   ALBUMIN 4.3 03/06/2021 1006   AST 23 04/15/2022 1729   ALT 28 04/15/2022 1729   ALKPHOS 125 04/15/2022 1729   BILITOT 1.2 04/15/2022 1729   BILITOT 0.8 03/06/2021 1006   GFRNONAA >60 04/15/2022 1729   GFRAA 90 02/08/2020 1117   Lab Results  Component Value Date   CHOL 166 07/04/2021   HDL 56.60 07/04/2021   LDLCALC 96 07/04/2021   LDLDIRECT 141.6 04/16/2010   TRIG 66.0 07/04/2021  CHOLHDL 3 07/04/2021   Lab Results  Component Value Date   HGBA1C 6.1 07/04/2021   Lab Results  Component Value Date   VITAMINB12 226 (L) 03/06/2021   Lab Results  Component Value Date   TSH 2.77 07/04/2021      ASSESSMENT AND PLAN 53 y.o. year old female  has a past medical history of Abdominal wall seroma, Anemia, Arthritis, Asthma, B12 deficiency, Back pain, Carpal tunnel syndrome, bilateral, Chest pain, Colitis, Constipation, Dysmenorrhea, Dyspnea, Fatty liver, GERD (gastroesophageal reflux disease), HLD  (hyperlipidemia), HPV (human papilloma virus) infection, Hypertension, Keratoconus, Leg edema, Obesity, Obesity, Osteoarthritis, Palpitations, Pre-diabetes, Prediabetes, Seasonal allergies, and Sleep apnea. here with:  OSA on CPAP  - order sent for new supplies - Restart using CPAP nightly and > 4 hours each night - F/U in 6 months or sooner if needed   Clem Currier, MSN, NP-C 07/07/2023, 3:35 PM [facility] [address], [address] [place], [place] [address] [phone]

## 2023-07-07 NOTE — Telephone Encounter (Signed)
 Pt called in regards to requesting new CPAP machine . Pt states is has a different smell and cold be the reason she has symptom of apneumia . PT  would like to be called  at this number [phone]

## 2023-07-07 NOTE — Progress Notes (Unsigned)
 Mindy Collins

## 2023-07-08 ENCOUNTER — Ambulatory Visit: Admitting: Adult Health

## 2023-07-08 ENCOUNTER — Encounter: Payer: Self-pay | Admitting: Adult Health

## 2023-07-08 VITALS — BP 147/81 | HR 93 | Ht 62.0 in | Wt 293.0 lb

## 2023-07-08 DIAGNOSIS — G4733 Obstructive sleep apnea (adult) (pediatric): Secondary | ICD-10-CM

## 2023-07-08 NOTE — Patient Instructions (Signed)
 Please take your machine to your DME company for servicing.  If they are unable to fix your machine.  Please let me know as I will go ahead and order you a replacement machine.  We will also repeat a home sleep test. If your symptoms worsen or you develop new symptoms please let us  know.

## 2023-07-17 ENCOUNTER — Ambulatory Visit (INDEPENDENT_AMBULATORY_CARE_PROVIDER_SITE_OTHER): Admitting: Neurology

## 2023-07-17 DIAGNOSIS — G4733 Obstructive sleep apnea (adult) (pediatric): Secondary | ICD-10-CM

## 2023-07-17 DIAGNOSIS — Z6841 Body Mass Index (BMI) 40.0 and over, adult: Secondary | ICD-10-CM

## 2023-07-17 DIAGNOSIS — F5101 Primary insomnia: Secondary | ICD-10-CM

## 2023-08-12 ENCOUNTER — Encounter: Payer: Self-pay | Admitting: Adult Health

## 2023-08-12 ENCOUNTER — Telehealth: Payer: Self-pay

## 2023-08-12 NOTE — Telephone Encounter (Signed)
 Pt called in stating that when she did her sleep test she saw a red light or x on the machine and wasn't sure if her results are going to accurate because of this. Looked at pt's chart and don't see where results were read yet. Pt stated that she was going to send mychart message to Duwaine Russell, NP to let her know what happened. Informed pt that once her results are read someone from our office will call her with her results and let her know if she will need to repeat them or not.

## 2023-08-19 NOTE — Progress Notes (Signed)
 [facility]   HOME SLEEP TEST REPORT ( by Elene  mail -out device )   Mindy Collins 53 year old female May 18, 1970  Study Protocol:     The SANSA single-point-of-skin-contact chest-worn sensor - an FDA cleared and DOT approved type 4 home sleep test device - measures eight physiological channels,  including blood oxygen saturation (measured via PPG [photoplethysmography]), EKG-derived heart rate, respiratory effort, chest movement (measured via accelerometer), snoring, body position, and actigraphy. The device is designed to be worn for up to 10 hours per study.    STUDY DATE:  07-30-2023 Data received : 08-19-2023     ORDERING CLINICIAN:  Dedra Gores, MD  REFERRING CLINICIAN: Duwaine Sherryl PIETY    CLINICAL INFORMATION/HISTORY:07/08/23    53 y.o. year old female  has a past medical history of Abdominal wall seroma, Anemia, Arthritis, Asthma, B12 deficiency, Back pain, Carpal tunnel syndrome, bilateral, Chest pain, Colitis, Constipation, Dysmenorrhea, Dyspnea, Fatty liver, GERD (gastroesophageal reflux disease), HLD (hyperlipidemia), HPV (human papilloma virus) infection, Hypertension, Keratoconus, Leg edema, Super- Obesity, Osteoarthritis, Palpitations, Prediabetes, Seasonal allergies, and Obstructive Sleep apnea. here with:   OSA on CPAP Mindy Collins is a 53 y.o. female with a history of OSA on CPAP. Returns today for follow-up.  She reports that she has not been using her CPAP machine because it has a smell.  She also was recently diagnosed with pneumonia and was not sure if this could have come from the CPAP machine.  She has not taken her CPAP machine to her DME company for evaluation.  She states that she has changed out all supplies but she still can smell it.  She describes the smell as pond water.  She does use distilled water in her machine.  She states that she can tell the benefit when she uses the machine.  She has noticed that she has not been using it that she is  tired throughout the day.  She returns today for an evaluation.     Epworth sleepiness score: 4 /24. FFS at  36 / 63 points   BMI: 50.6 kg/m  Neck Circumference: na   FINDINGS:  Sleep Summary:   Total Recording Time (hours, min):   The study was recorded beginning on 07-30-2023 at 11 hours 37 minutes p.m.  Total recording time 8 hours 16 minutes,   Total Sleep Time (hours, min): 3 hours 35 minutes Sleep efficiency %;   43%                                    Respiratory Indices by AASM criteria of scoring;    Calculated pAHI (per hour): 28.6/h                                               Positional  respiratory activity  / snoring : The highest AHI was seen  in prone sleep, but the patient remained in this position for less than 20 minutes.  The majority of sleep was recorded in lateral position with an AHI of 22.8/h.  Oxygen Saturation  in Sleep    Oxygen Saturation (%) Mean:    94%               O2 Saturation Range (%):   The nadir was at  67% saturation the maximum saturation was 100%.  There were 9 minutes of desaturation time with the O2 Saturation (minutes) <89%:           Pulse Rate in Sleep :   Pulse Mean (bpm): 87 bpm               Pulse Range:   Between 74 and 98 bpm and normal sinus rhythm.              IMPRESSION:  This HST confirms the presence of moderately severe obstructive sleep apnea with a very reduced sleep time.  The patient was awake between 2:30 AM and 4:50 AM.   RECOMMENDATION: I recommend to continue auto titration CPAP therapy for this patient with a component of hypoventilation, this is a moderate obstructive sleep apnea seen in a patient with a body mass index of 50.6, who reports that she feels better when she uses her CPAP.  Nurse practitioner Duwaine Eriksson ordered the sleep study and we will follow-up with the results.  She reports that the patient is due for new machine anyway.  Settings were 6 through 18 cm water pressure, 3 cm EPR and heated  humidification-  the patient can choose her  preferred interface.    Any Patient endorsing a high level of sleepiness should be cautioned not to drive, work at heights, or operate dangerous machinery or heavy equipment when tired or sleepy.  Review of good sleep hygiene measures took place in the initial consultation but should be revisited ( Your guide to better sleep  a publication by the NIH is a good source of information).   The referring provider will be notified of the test results.    I certify that I have reviewed the raw data recording prior to the issuance of this report in accordance with the standards of the American Academy of Sleep Medicine (AASM).    INTERPRETING PHYSICIAN:  Dedra Gores, MD  [facility] Sleep Board certified by The [employer] of Sleep Medicine and Diplomate of the [employer] of Sleep Medicine. Board certified In Neurology through the ABPN, Fellow of the [employer] of Neu[employer].

## 2023-08-24 ENCOUNTER — Other Ambulatory Visit: Payer: Self-pay | Admitting: Neurology

## 2023-08-24 DIAGNOSIS — G4733 Obstructive sleep apnea (adult) (pediatric): Secondary | ICD-10-CM | POA: Insufficient documentation

## 2023-08-24 NOTE — Procedures (Signed)
 [facility]   HOME SLEEP TEST REPORT ( by Elene  mail -out device )   Mindy Collins 53 year old female May 18, 1970  Study Protocol:     The SANSA single-point-of-skin-contact chest-worn sensor - an FDA cleared and DOT approved type 4 home sleep test device - measures eight physiological channels,  including blood oxygen saturation (measured via PPG [photoplethysmography]), EKG-derived heart rate, respiratory effort, chest movement (measured via accelerometer), snoring, body position, and actigraphy. The device is designed to be worn for up to 10 hours per study.    STUDY DATE:  07-30-2023 Data received : 08-19-2023     ORDERING CLINICIAN:  Dedra Gores, MD  REFERRING CLINICIAN: Duwaine Sherryl PIETY    CLINICAL INFORMATION/HISTORY:07/08/23    53 y.o. year old female  has a past medical history of Abdominal wall seroma, Anemia, Arthritis, Asthma, B12 deficiency, Back pain, Carpal tunnel syndrome, bilateral, Chest pain, Colitis, Constipation, Dysmenorrhea, Dyspnea, Fatty liver, GERD (gastroesophageal reflux disease), HLD (hyperlipidemia), HPV (human papilloma virus) infection, Hypertension, Keratoconus, Leg edema, Super- Obesity, Osteoarthritis, Palpitations, Prediabetes, Seasonal allergies, and Obstructive Sleep apnea. here with:   OSA on CPAP Mindy Collins is a 53 y.o. female with a history of OSA on CPAP. Returns today for follow-up.  She reports that she has not been using her CPAP machine because it has a smell.  She also was recently diagnosed with pneumonia and was not sure if this could have come from the CPAP machine.  She has not taken her CPAP machine to her DME company for evaluation.  She states that she has changed out all supplies but she still can smell it.  She describes the smell as pond water.  She does use distilled water in her machine.  She states that she can tell the benefit when she uses the machine.  She has noticed that she has not been using it that she is  tired throughout the day.  She returns today for an evaluation.     Epworth sleepiness score: 4 /24. FFS at  36 / 63 points   BMI: 50.6 kg/m  Neck Circumference: na   FINDINGS:  Sleep Summary:   Total Recording Time (hours, min):   The study was recorded beginning on 07-30-2023 at 11 hours 37 minutes p.m.  Total recording time 8 hours 16 minutes,   Total Sleep Time (hours, min): 3 hours 35 minutes Sleep efficiency %;   43%                                    Respiratory Indices by AASM criteria of scoring;    Calculated pAHI (per hour): 28.6/h                                               Positional  respiratory activity  / snoring : The highest AHI was seen  in prone sleep, but the patient remained in this position for less than 20 minutes.  The majority of sleep was recorded in lateral position with an AHI of 22.8/h.  Oxygen Saturation  in Sleep    Oxygen Saturation (%) Mean:    94%               O2 Saturation Range (%):   The nadir was at  67% saturation the maximum saturation was 100%.  There were 9 minutes of desaturation time with the O2 Saturation (minutes) <89%:           Pulse Rate in Sleep :   Pulse Mean (bpm): 87 bpm               Pulse Range:   Between 74 and 98 bpm and normal sinus rhythm.              IMPRESSION:  This HST confirms the presence of moderately severe obstructive sleep apnea with a very reduced sleep time.  The patient was awake between 2:30 AM and 4:50 AM.   RECOMMENDATION: I recommend to continue auto titration CPAP therapy for this patient with a component of hypoventilation, this is a moderate obstructive sleep apnea seen in a patient with a body mass index of 50.6, who reports that she feels better when she uses her CPAP.  Nurse practitioner Duwaine Eriksson ordered the sleep study and we will follow-up with the results.  She reports that the patient is due for new machine anyway.  Settings were 6 through 18 cm water pressure, 3 cm EPR and heated  humidification-  the patient can choose her  preferred interface.    Any Patient endorsing a high level of sleepiness should be cautioned not to drive, work at heights, or operate dangerous machinery or heavy equipment when tired or sleepy.  Review of good sleep hygiene measures took place in the initial consultation but should be revisited ( Your guide to better sleep  a publication by the NIH is a good source of information).   The referring provider will be notified of the test results.    I certify that I have reviewed the raw data recording prior to the issuance of this report in accordance with the standards of the American Academy of Sleep Medicine (AASM).    INTERPRETING PHYSICIAN:  Dedra Gores, MD  [facility] Sleep Board certified by The [employer] of Sleep Medicine and Diplomate of the [employer] Medicine. Board certified In Neurology through the ABPN, Fellow of the [employer].

## 2023-08-26 ENCOUNTER — Ambulatory Visit: Payer: Self-pay | Admitting: Adult Health

## 2023-08-26 DIAGNOSIS — G4733 Obstructive sleep apnea (adult) (pediatric): Secondary | ICD-10-CM

## 2023-08-26 NOTE — Telephone Encounter (Signed)
 Order sent to [facility] for new machine.

## 2023-08-26 NOTE — Telephone Encounter (Signed)
 New, Maryella Shivers, Otilio Jefferson, RN; Alain Honey; Jeris Penta, [place]; 1 other Received, thank you!

## 2024-01-20 ENCOUNTER — Encounter (HOSPITAL_COMMUNITY): Payer: Self-pay

## 2024-01-20 ENCOUNTER — Ambulatory Visit (HOSPITAL_COMMUNITY): Admission: EM | Admit: 2024-01-20 | Discharge: 2024-01-20 | Disposition: A | Discharge status: Home / Self Care

## 2024-01-20 DIAGNOSIS — K219 Gastro-esophageal reflux disease without esophagitis: Secondary | ICD-10-CM

## 2024-01-20 DIAGNOSIS — R109 Unspecified abdominal pain: Secondary | ICD-10-CM

## 2024-01-20 MED ORDER — POLYETHYLENE GLYCOL 3350 17 GM/SCOOP PO POWD: 17.0000 g | Freq: Every day | ORAL | 0 refills | Status: AC

## 2024-01-20 MED ORDER — SENNOSIDES-DOCUSATE SODIUM 8.6-50 MG PO TABS: 1.0000 | ORAL_TABLET | Freq: Every day | ORAL | 0 refills | Status: AC

## 2024-01-20 MED ORDER — RABEPRAZOLE SODIUM 20 MG PO TBEC: 20.0000 mg | DELAYED_RELEASE_TABLET | Freq: Every day | ORAL | 0 refills | Status: AC

## 2024-01-20 NOTE — Discharge Instructions (Addendum)
 You were seen today urgent care for abdominal pressure.  Given your history I think it is important that you follow-up with the GI team and your PCP.  I sent some medications to your pharmacy to help with some of your symptoms including Aciphex , and a stool softener/laxative.  You may also use over-the-counter MiraLAX to help with your constipation.  Keep track of your symptoms and any sort of sugar substitutes (like we discussed) or abnormal food that you are eating in relation to your symptoms.  I encourage you to eat frequent small meals throughout the day and not eat immediately before going to bed.  If you are can eat late at night try to stay upright and active (go for a walk) for a little while prior to going to sleep to see if that helps improve your reflux.  If your symptoms worsen or do not improve over the next couple of days with your medications please follow-up with your PCP or return to urgent care for further workup.  If you develop any sort of severe nausea, vomiting or fever please go to the emergency department.

## 2024-01-20 NOTE — ED Provider Notes (Signed)
 [facility]    CSN: [id number] Arrival date & time: 01/20/24  1736      History   Chief Complaint Chief Complaint  Patient presents with   Abdominal Pain    HPI Mindy Collins is a 53 y.o. female.   Patient presents with abdominal pressure, reflux symptoms.  She has been on Aciphex  in the past with success in relieving her symptoms however she is not currently taking this medication.  She does not currently follow with a GI doctor.  She does have a PCP. First episode of abdominal pressure right before thanksgiving. Initially started in her back and wrapped around to her abdomen. She has a feeling of fullness in her upper stomach/abdomen.  Is having episodes of constipation.  She does report still having reflux symptoms.  She is not currently taking a PPI.  She states that her eating habits have been worse than normal.  She will not eat much for the entire day and then eat a large meal right before going to bed.  She does work a relatively sedentary job.  She does have a family history of GI issues.  She would like a referral to a GI doctor.  She denies vomiting, pain, or severe nausea.  She endorses a feeling a fullness and incomplete emptying after using the bathroom.  Been without her normal cpap for about 2 months but she has been using her old one and she picks up her new 1 tomorrow.    The history is provided by the patient.  Abdominal Pain   Past Medical History:  Diagnosis Date   Abdominal wall seroma    Anemia    Arthritis    Asthma    B12 deficiency    Back pain    Carpal tunnel syndrome, bilateral    Chest pain    Colitis    Constipation    Dysmenorrhea    Dyspnea    Fatty liver    GERD (gastroesophageal reflux disease)    HLD (hyperlipidemia)    HPV (human papilloma virus) infection    WARTS/ TCA TX   Hypertension    Keratoconus    Leg edema    Obesity    Obesity    Osteoarthritis    Palpitations    Pre-diabetes    Prediabetes    Seasonal  allergies    Sleep apnea     Patient Active Problem List   Diagnosis Date Noted   OSA on CPAP 08/24/2023   Class 3 severe obesity with serious comorbidity and body mass index (BMI) of 50.0 to 59.9 in adult (HCC) 06/22/2018   Other fatigue 09/07/2017   Shortness of breath on exertion 09/07/2017   Moderate mixed hyperlipidemia not requiring statin therapy 09/07/2017   Vitamin D  deficiency 09/07/2017   Transaminitis 03/08/2017   Vitamin B12 deficiency 03/08/2017   Primary insomnia 03/08/2017   Prediabetes 10/03/2016   Hyperlipidemia LDL goal <130 10/03/2016   Abdominal wall seroma 03/14/2016   Adnexal mass 03/03/2016   Thrombocytosis 07/31/2014   IUD (intrauterine device) in place 07/31/2014   DUB (dysfunctional uterine bleeding) 07/27/2014   Intramural leiomyoma of uterus 06/30/2014   Hydrosalpinx 06/30/2014   Back pain 05/30/2014   HYPERTENSION, BENIGN 04/05/2010    Past Surgical History:  Procedure Laterality Date   GYNECOLOGIC CRYOSURGERY  1993   DYSPLASIA CERVIX/    HYSTEROSCOPY N/A 08/18/2019   Procedure: HYSTEROSCOPY WITH UNSUCCESSFUL  REMOVAL OF IUD AND DILATION OF THE CERVIX UNDER ULTRASOUND GUIDANCE;  Surgeon: Lavoie, Marie-Lyne, MD;  Location: [facility] OR;  Service: Gynecology;  Laterality: N/A;  request 8:30am OR time in IQUEUE held time for [facility] Gyn requests one hour   MYOMECTOMY  02/17/2011   Procedure: MYOMECTOMY;  Surgeon: Curlee VEAR Guan, MD;  Location: [facility] ORS;  Service: Gynecology;  Laterality: N/A;  I am hoping to get a 7:30am time on Dec 4, Dec 11 or Dec 12. It not available I will check on some 1:00 pm times. Thanks   MYOMECTOMY ABDOMINAL APPROACH  04/31/2007   OVARIAN CYST REMOVAL  02/17/2011   Procedure: OVARIAN CYSTECTOMY;  Surgeon: Curlee VEAR Guan, MD;  Location: [facility] ORS;  Service: Gynecology;  Laterality: Left;    OB History     Gravida  0   Para      Term      Preterm      AB      Living         SAB      IAB      Ectopic      Multiple       Live Births               Home Medications    Prior to Admission medications   Medication Sig Start Date End Date Taking? Authorizing Provider  albuterol (VENTOLIN HFA) 108 (90 Base) MCG/ACT inhaler 2 puffs. 06/17/23  Yes [provider]  polyethylene glycol powder (GLYCOLAX/MIRALAX) 17 GM/SCOOP powder Take 17 g by mouth daily. Dissolve 1 capful (17g) in 4-8 ounces of liquid and take by mouth daily. 01/20/24  Yes Inetha Maret, Jorene, NP  RABEprazole  (ACIPHEX ) 20 MG tablet Take 1 tablet (20 mg total) by mouth daily. 01/20/24  Yes Amato Sevillano, Jorene, NP  senna-docusate (SENOKOT-S) 8.6-50 MG tablet Take 1 tablet by mouth daily. 01/20/24  Yes Jaidynn Balster, Jorene, NP  atorvastatin  (LIPITOR) 40 MG tablet Take 1 tablet (40 mg total) by mouth daily. 07/16/21   Kip Ade, NP  Carboxymethylcellulose Sodium (REFRESH TEARS OP) Place 2 drops into both eyes daily as needed (dry eyes).    [provider]  cyclobenzaprine  (FLEXERIL ) 5 MG tablet Take 1 tablet (5 mg total) by mouth 3 (three) times daily as needed for muscle spasms. 07/25/21   Kip Ade, NP  diclofenac  (VOLTAREN ) 75 MG EC tablet Take 1 tablet (75 mg total) by mouth 2 (two) times daily. 12/13/21   Wedderburn, [name] N, NP  dicyclomine  (BENTYL ) 10 MG capsule Take 1 capsule (10 mg total) by mouth 3 (three) times daily before meals. Patient taking differently: Take 10 mg by mouth 3 (three) times daily as needed for spasms. 04/10/21   Eda Iha, MD  fluticasone (FLONASE) 50 MCG/ACT nasal spray Place 1 spray into the nose.    [provider]  Ketotifen Fumarate (ALAWAY OP) Place 2 drops into both eyes daily as needed (dry, itchy eyes).    [provider]  levonorgestrel  (MIRENA ) 20 MCG/24HR IUD 1 each by Intrauterine route once.    [provider]  levonorgestrel  (MIRENA , 52 MG,) 20 MCG/DAY IUD Mirena     [provider]  loratadine (CLARITIN) 10 MG tablet Take 10 mg by mouth daily as needed for  allergies.     [provider]  metFORMIN  (GLUCOPHAGE ) 500 MG tablet Take 1 tablet (500 mg total) by mouth 2 (two) times daily with a meal. 07/16/21   Kip Ade, NP  Multiple Vitamin (MULTIVITAMIN PO) Take 1 tablet by mouth daily.    [provider]  olmesartan  (BENICAR ) 20 MG tablet Take 1 tablet (20 mg total) by mouth daily. 07/16/21   Kip Ade, NP  vitamin B-12 (CYANOCOBALAMIN) 1000 MCG tablet Take 1,000 mcg by mouth daily.    [provider]  Vitamin D , Ergocalciferol , (DRISDOL ) 1.25 MG (50000 UNIT) CAPS capsule Take 1 capsule (50,000 Units total) by mouth every 7 (seven) days. 07/16/21   Kip Ade, NP    Family History Family History  Problem Relation Age of Onset   Hypertension Mother    Breast cancer Mother    Cancer Mother    Hyperlipidemia Mother    Thyroid disease Mother    Obesity Mother    Cancer Father        LUNG   Breast cancer Maternal Aunt    Cancer Paternal Aunt    Ovarian cancer Paternal Aunt    Heart disease Maternal Grandmother    Diabetes Maternal Grandfather    Pancreatic cancer Neg Hx    Stomach cancer Neg Hx    Liver cancer Neg Hx    Colon cancer Neg Hx    Sleep apnea Neg Hx     Social History Social History   Tobacco Use   Smoking status: Never   Smokeless tobacco: Never   Tobacco comments:    denies tobacco   Vaping Use   Vaping status: Never Used  Substance Use Topics   Alcohol use: No    Alcohol/week: 0.0 standard drinks of alcohol   Drug use: No     Allergies   Esomeprazole magnesium, Adhesive [tape], Latex, Levocetirizine dihydrochloride, and Esomeprazole   Review of Systems Review of Systems  Gastrointestinal:  Positive for abdominal pain.     Physical Exam Triage Vital Signs ED Triage Vitals  Encounter Vitals Group     BP 01/20/24 1756 (!) 165/92     Girls Systolic BP Percentile --      Girls Diastolic BP Percentile --      Boys Systolic BP Percentile --      Boys Diastolic BP  Percentile --      Pulse Rate 01/20/24 1756 93     Resp 01/20/24 1756 18     Temp 01/20/24 1756 98.7 F (37.1 C)     Temp Source 01/20/24 1756 Oral     SpO2 01/20/24 1756 98 %     Weight --      Height --      Head Circumference --      Peak Flow --      Pain Score 01/20/24 1753 0     Pain Loc --      Pain Education --      Exclude from Growth Chart --    No data found.  Updated Vital Signs BP (!) 165/92 (BP Location: Right Arm)   Pulse 93   Temp 98.7 F (37.1 C) (Oral)   Resp 18   SpO2 98%   Visual Acuity Right Eye Distance:   Left Eye Distance:   Bilateral Distance:    Right Eye Near:   Left Eye Near:    Bilateral Near:     Physical Exam Vitals and nursing note reviewed.  Constitutional:      General: She is not in acute distress.    Appearance: She is well-developed.  HENT:     Head: Normocephalic and atraumatic.  Eyes:     Conjunctiva/sclera: Conjunctivae normal.  Cardiovascular:     Rate and Rhythm: Normal rate and regular rhythm.  Heart sounds: No murmur heard. Pulmonary:     Effort: Pulmonary effort is normal. No respiratory distress.     Breath sounds: Normal breath sounds.  Abdominal:     General: Abdomen is protuberant. Bowel sounds are normal. There is distension.     Tenderness: There is no abdominal tenderness. There is no guarding or rebound.  Musculoskeletal:        General: No swelling.     Cervical back: Neck supple.  Skin:    General: Skin is warm and dry.     Capillary Refill: Capillary refill takes less than 2 seconds.  Neurological:     Mental Status: She is alert.  Psychiatric:        Mood and Affect: Mood normal.      UC Treatments / Results  Labs (all labs ordered are listed, but only abnormal results are displayed) Labs Reviewed - No data to display  EKG   Radiology No results found.  Procedures Procedures (including critical care time)  Medications Ordered in UC Medications - No data to display  Initial  Impression / Assessment and Plan / UC Course  I have reviewed the triage vital signs and the nursing notes.  Pertinent labs & imaging results that were available during my care of the patient were reviewed by me and considered in my medical decision making (see chart for details).  Presents with abdominal fullness and constipation.  Prescribed her Aciphex  as she has had success with this medication in the past and a stool softener/laxative.  Recommend she follows up with her PCP for further acid reflux medication until she is able to get in with the GI team.  We also discussed improving eating habits frequent small movement also throughout the day and not eating a large meal prior to going to bed.   Final Clinical Impressions(s) / UC Diagnoses   Final diagnoses:  Gastroesophageal reflux disease, unspecified whether esophagitis present  Abdominal pressure     Discharge Instructions      You were seen today urgent care for abdominal pressure.  Given your history I think it is important that you follow-up with the GI team and your PCP.  I sent some medications to your pharmacy to help with some of your symptoms including Aciphex , and a stool softener/laxative.  You may also use over-the-counter MiraLAX to help with your constipation.  Keep track of your symptoms and any sort of sugar substitutes (like we discussed) or abnormal food that you are eating in relation to your symptoms.  I encourage you to eat frequent small meals throughout the day and not eat immediately before going to bed.  If you are can eat late at night try to stay upright and active (go for a walk) for a little while prior to going to sleep to see if that helps improve your reflux.  If your symptoms worsen or do not improve over the next couple of days with your medications please follow-up with your PCP or return to urgent care for further workup.  If you develop any sort of severe nausea, vomiting or fever please go to the  emergency department.     ED Prescriptions     Medication Sig Dispense Auth. Provider   RABEprazole  (ACIPHEX ) 20 MG tablet Take 1 tablet (20 mg total) by mouth daily. 30 tablet Remi, [place], NP   polyethylene glycol powder (GLYCOLAX/MIRALAX) 17 GM/SCOOP powder Take 17 g by mouth daily. Dissolve 1 capful (17g) in 4-8 ounces of liquid  and take by mouth daily. 238 g Remi Pippin, NP   senna-docusate (SENOKOT-S) 8.6-50 MG tablet Take 1 tablet by mouth daily. 30 tablet Remi Pippin, NP      PDMP not reviewed this encounter.   Remi Pippin, NP 01/20/24 [phone]

## 2024-01-20 NOTE — ED Triage Notes (Signed)
 Pt c/o epigastric tightness and fullness for a week. States had the same around thanksgiving. States gets full faster than normal and feels tight. States also not having a full BM since thanksgiving. States drinks fluid without fullness.
